# Patient Record
Sex: Female | Born: 1960
Health system: Southern US, Community
[De-identification: ages and names within clinical notes are randomized; demographics above are authoritative.]

## PROBLEM LIST (undated history)

## (undated) ENCOUNTER — Emergency Department (HOSPITAL_COMMUNITY): Discharge status: Against Medical Advice | Payer: Self-pay

## (undated) DIAGNOSIS — I639 Cerebral infarction, unspecified: Secondary | ICD-10-CM

## (undated) DIAGNOSIS — I2699 Other pulmonary embolism without acute cor pulmonale: Secondary | ICD-10-CM

## (undated) DIAGNOSIS — I82409 Acute embolism and thrombosis of unspecified deep veins of unspecified lower extremity: Secondary | ICD-10-CM

## (undated) DIAGNOSIS — K2980 Duodenitis without bleeding: Secondary | ICD-10-CM

## (undated) DIAGNOSIS — F419 Anxiety disorder, unspecified: Secondary | ICD-10-CM

## (undated) DIAGNOSIS — Z8673 Personal history of transient ischemic attack (TIA), and cerebral infarction without residual deficits: Secondary | ICD-10-CM

## (undated) DIAGNOSIS — I469 Cardiac arrest, cause unspecified: Secondary | ICD-10-CM

## (undated) DIAGNOSIS — Z720 Tobacco use: Secondary | ICD-10-CM

## (undated) DIAGNOSIS — F329 Major depressive disorder, single episode, unspecified: Secondary | ICD-10-CM

## (undated) DIAGNOSIS — K625 Hemorrhage of anus and rectum: Secondary | ICD-10-CM

## (undated) DIAGNOSIS — R079 Chest pain, unspecified: Secondary | ICD-10-CM

## (undated) DIAGNOSIS — F1010 Alcohol abuse, uncomplicated: Secondary | ICD-10-CM

## (undated) DIAGNOSIS — I10 Essential (primary) hypertension: Secondary | ICD-10-CM

## (undated) DIAGNOSIS — F1020 Alcohol dependence, uncomplicated: Secondary | ICD-10-CM

## (undated) DIAGNOSIS — F32A Depression, unspecified: Secondary | ICD-10-CM

## (undated) DIAGNOSIS — F1411 Cocaine abuse, in remission: Secondary | ICD-10-CM

## (undated) DIAGNOSIS — D518 Other vitamin B12 deficiency anemias: Secondary | ICD-10-CM

## (undated) DIAGNOSIS — R011 Cardiac murmur, unspecified: Secondary | ICD-10-CM

## (undated) DIAGNOSIS — K219 Gastro-esophageal reflux disease without esophagitis: Secondary | ICD-10-CM

## (undated) DIAGNOSIS — R569 Unspecified convulsions: Secondary | ICD-10-CM

## (undated) HISTORY — DX: Major depressive disorder, single episode, unspecified: F32.9

## (undated) HISTORY — DX: Alcohol dependence, uncomplicated: F10.20

## (undated) HISTORY — DX: Anxiety disorder, unspecified: F41.9

## (undated) HISTORY — DX: Depression, unspecified: F32.A

## (undated) HISTORY — DX: Hemorrhage of anus and rectum: K62.5

## (undated) HISTORY — DX: Cardiac arrest, cause unspecified: I46.9

## (undated) HISTORY — DX: Cocaine abuse, in remission: F14.11

## (undated) HISTORY — DX: Other vitamin B12 deficiency anemias: D51.8

## (undated) HISTORY — DX: Gastro-esophageal reflux disease without esophagitis: K21.9

---

## 1998-04-01 ENCOUNTER — Emergency Department (HOSPITAL_COMMUNITY): Admission: EM | Admit: 1998-04-01 | Discharge: 1998-04-01 | Payer: Self-pay | Admitting: Emergency Medicine

## 1998-06-30 ENCOUNTER — Emergency Department (HOSPITAL_COMMUNITY): Admission: EM | Admit: 1998-06-30 | Discharge: 1998-06-30 | Payer: Self-pay | Admitting: *Deleted

## 1998-07-19 ENCOUNTER — Ambulatory Visit (HOSPITAL_COMMUNITY): Admission: RE | Admit: 1998-07-19 | Discharge: 1998-07-19 | Payer: Self-pay | Admitting: *Deleted

## 1998-07-19 ENCOUNTER — Encounter: Payer: Self-pay | Admitting: *Deleted

## 1999-02-03 ENCOUNTER — Emergency Department (HOSPITAL_COMMUNITY): Admission: EM | Admit: 1999-02-03 | Discharge: 1999-02-03 | Payer: Self-pay | Admitting: Emergency Medicine

## 1999-02-03 ENCOUNTER — Encounter: Payer: Self-pay | Admitting: Emergency Medicine

## 1999-08-07 ENCOUNTER — Encounter: Payer: Self-pay | Admitting: Emergency Medicine

## 1999-08-07 ENCOUNTER — Emergency Department (HOSPITAL_COMMUNITY): Admission: EM | Admit: 1999-08-07 | Discharge: 1999-08-07 | Payer: Self-pay | Admitting: Emergency Medicine

## 1999-10-10 ENCOUNTER — Other Ambulatory Visit: Admission: RE | Admit: 1999-10-10 | Discharge: 1999-10-10 | Payer: Self-pay | Admitting: Family Medicine

## 1999-10-15 ENCOUNTER — Emergency Department (HOSPITAL_COMMUNITY): Admission: EM | Admit: 1999-10-15 | Discharge: 1999-10-15 | Payer: Self-pay | Admitting: Emergency Medicine

## 1999-10-26 ENCOUNTER — Emergency Department (HOSPITAL_COMMUNITY): Admission: EM | Admit: 1999-10-26 | Discharge: 1999-10-26 | Payer: Self-pay | Admitting: Emergency Medicine

## 1999-10-26 ENCOUNTER — Encounter: Payer: Self-pay | Admitting: Emergency Medicine

## 1999-11-04 ENCOUNTER — Emergency Department (HOSPITAL_COMMUNITY): Admission: EM | Admit: 1999-11-04 | Discharge: 1999-11-04 | Payer: Self-pay | Admitting: Emergency Medicine

## 1999-12-25 ENCOUNTER — Inpatient Hospital Stay (HOSPITAL_COMMUNITY): Admission: AD | Admit: 1999-12-25 | Discharge: 1999-12-25 | Payer: Self-pay | Admitting: Obstetrics

## 2000-01-17 ENCOUNTER — Emergency Department (HOSPITAL_COMMUNITY): Admission: EM | Admit: 2000-01-17 | Discharge: 2000-01-17 | Payer: Self-pay | Admitting: Emergency Medicine

## 2000-02-16 ENCOUNTER — Ambulatory Visit (HOSPITAL_COMMUNITY): Admission: RE | Admit: 2000-02-16 | Discharge: 2000-02-16 | Payer: Self-pay | Admitting: Family Medicine

## 2000-03-28 ENCOUNTER — Emergency Department (HOSPITAL_COMMUNITY): Admission: EM | Admit: 2000-03-28 | Discharge: 2000-03-28 | Payer: Self-pay | Admitting: Emergency Medicine

## 2000-06-26 ENCOUNTER — Encounter: Payer: Self-pay | Admitting: Emergency Medicine

## 2000-06-26 ENCOUNTER — Emergency Department (HOSPITAL_COMMUNITY): Admission: EM | Admit: 2000-06-26 | Discharge: 2000-06-26 | Payer: Self-pay | Admitting: Emergency Medicine

## 2000-09-03 ENCOUNTER — Emergency Department (HOSPITAL_COMMUNITY): Admission: EM | Admit: 2000-09-03 | Discharge: 2000-09-03 | Payer: Self-pay

## 2000-09-17 ENCOUNTER — Emergency Department (HOSPITAL_COMMUNITY): Admission: EM | Admit: 2000-09-17 | Discharge: 2000-09-17 | Payer: Self-pay | Admitting: Emergency Medicine

## 2001-01-28 ENCOUNTER — Encounter: Payer: Self-pay | Admitting: Emergency Medicine

## 2001-01-29 ENCOUNTER — Observation Stay (HOSPITAL_COMMUNITY): Admission: EM | Admit: 2001-01-29 | Discharge: 2001-01-29 | Payer: Self-pay | Admitting: Emergency Medicine

## 2001-01-31 ENCOUNTER — Encounter: Admission: RE | Admit: 2001-01-31 | Discharge: 2001-01-31 | Payer: Self-pay | Admitting: Internal Medicine

## 2001-03-24 ENCOUNTER — Emergency Department (HOSPITAL_COMMUNITY): Admission: EM | Admit: 2001-03-24 | Discharge: 2001-03-24 | Payer: Self-pay | Admitting: *Deleted

## 2001-09-04 ENCOUNTER — Inpatient Hospital Stay (HOSPITAL_COMMUNITY): Admission: EM | Admit: 2001-09-04 | Discharge: 2001-09-05 | Payer: Self-pay | Admitting: Emergency Medicine

## 2001-12-02 ENCOUNTER — Emergency Department (HOSPITAL_COMMUNITY): Admission: EM | Admit: 2001-12-02 | Discharge: 2001-12-02 | Payer: Self-pay | Admitting: Emergency Medicine

## 2001-12-02 ENCOUNTER — Emergency Department (HOSPITAL_COMMUNITY): Admission: EM | Admit: 2001-12-02 | Discharge: 2001-12-03 | Payer: Self-pay | Admitting: Emergency Medicine

## 2001-12-03 ENCOUNTER — Encounter: Payer: Self-pay | Admitting: Emergency Medicine

## 2002-04-22 ENCOUNTER — Emergency Department (HOSPITAL_COMMUNITY): Admission: EM | Admit: 2002-04-22 | Discharge: 2002-04-22 | Payer: Self-pay | Admitting: Emergency Medicine

## 2002-06-14 ENCOUNTER — Encounter: Payer: Self-pay | Admitting: Emergency Medicine

## 2002-06-14 ENCOUNTER — Emergency Department (HOSPITAL_COMMUNITY): Admission: EM | Admit: 2002-06-14 | Discharge: 2002-06-14 | Payer: Self-pay | Admitting: Emergency Medicine

## 2002-08-12 ENCOUNTER — Emergency Department (HOSPITAL_COMMUNITY): Admission: EM | Admit: 2002-08-12 | Discharge: 2002-08-12 | Payer: Self-pay

## 2003-05-11 ENCOUNTER — Encounter: Admission: RE | Admit: 2003-05-11 | Discharge: 2003-05-11 | Payer: Self-pay | Admitting: Family Medicine

## 2003-05-11 ENCOUNTER — Encounter: Payer: Self-pay | Admitting: Family Medicine

## 2003-07-04 ENCOUNTER — Emergency Department (HOSPITAL_COMMUNITY): Admission: EM | Admit: 2003-07-04 | Discharge: 2003-07-04 | Payer: Self-pay | Admitting: *Deleted

## 2003-08-21 ENCOUNTER — Encounter: Payer: Self-pay | Admitting: Family Medicine

## 2003-08-21 ENCOUNTER — Ambulatory Visit (HOSPITAL_COMMUNITY): Admission: RE | Admit: 2003-08-21 | Discharge: 2003-08-21 | Payer: Self-pay | Admitting: Family Medicine

## 2004-04-29 ENCOUNTER — Ambulatory Visit (HOSPITAL_COMMUNITY): Admission: RE | Admit: 2004-04-29 | Discharge: 2004-04-29 | Payer: Self-pay | Admitting: Internal Medicine

## 2004-05-09 ENCOUNTER — Emergency Department (HOSPITAL_COMMUNITY): Admission: EM | Admit: 2004-05-09 | Discharge: 2004-05-09 | Payer: Self-pay | Admitting: Emergency Medicine

## 2004-05-30 ENCOUNTER — Encounter: Admission: RE | Admit: 2004-05-30 | Discharge: 2004-05-30 | Payer: Self-pay | Admitting: Family Medicine

## 2004-08-15 ENCOUNTER — Ambulatory Visit: Payer: Self-pay | Admitting: Family Medicine

## 2004-09-22 ENCOUNTER — Emergency Department (HOSPITAL_COMMUNITY): Admission: EM | Admit: 2004-09-22 | Discharge: 2004-09-22 | Payer: Self-pay | Admitting: Emergency Medicine

## 2004-12-05 ENCOUNTER — Ambulatory Visit: Payer: Self-pay | Admitting: Psychiatry

## 2004-12-05 ENCOUNTER — Inpatient Hospital Stay (HOSPITAL_COMMUNITY): Admission: RE | Admit: 2004-12-05 | Discharge: 2004-12-10 | Payer: Self-pay | Admitting: Psychiatry

## 2005-02-19 ENCOUNTER — Emergency Department (HOSPITAL_COMMUNITY): Admission: EM | Admit: 2005-02-19 | Discharge: 2005-02-20 | Payer: Self-pay | Admitting: *Deleted

## 2005-02-22 ENCOUNTER — Ambulatory Visit: Payer: Self-pay | Admitting: *Deleted

## 2005-02-22 ENCOUNTER — Ambulatory Visit: Payer: Self-pay | Admitting: Family Medicine

## 2005-10-30 DIAGNOSIS — K625 Hemorrhage of anus and rectum: Secondary | ICD-10-CM

## 2005-10-30 HISTORY — DX: Hemorrhage of anus and rectum: K62.5

## 2006-04-23 ENCOUNTER — Ambulatory Visit: Payer: Self-pay | Admitting: Internal Medicine

## 2006-04-23 ENCOUNTER — Inpatient Hospital Stay (HOSPITAL_COMMUNITY): Admission: EM | Admit: 2006-04-23 | Discharge: 2006-04-26 | Payer: Self-pay | Admitting: Emergency Medicine

## 2006-04-25 ENCOUNTER — Encounter (INDEPENDENT_AMBULATORY_CARE_PROVIDER_SITE_OTHER): Payer: Self-pay | Admitting: *Deleted

## 2006-04-26 ENCOUNTER — Encounter: Payer: Self-pay | Admitting: Cardiology

## 2006-05-09 ENCOUNTER — Ambulatory Visit: Payer: Self-pay | Admitting: Internal Medicine

## 2006-06-05 ENCOUNTER — Ambulatory Visit: Payer: Self-pay | Admitting: Internal Medicine

## 2006-06-22 ENCOUNTER — Ambulatory Visit (HOSPITAL_COMMUNITY): Admission: RE | Admit: 2006-06-22 | Discharge: 2006-06-22 | Payer: Self-pay | Admitting: Internal Medicine

## 2006-06-26 ENCOUNTER — Ambulatory Visit: Payer: Self-pay | Admitting: Hospitalist

## 2006-06-29 ENCOUNTER — Encounter: Admission: RE | Admit: 2006-06-29 | Discharge: 2006-06-29 | Payer: Self-pay | Admitting: Internal Medicine

## 2006-08-23 ENCOUNTER — Emergency Department (HOSPITAL_COMMUNITY): Admission: EM | Admit: 2006-08-23 | Discharge: 2006-08-23 | Payer: Self-pay | Admitting: Emergency Medicine

## 2006-08-30 ENCOUNTER — Ambulatory Visit: Payer: Self-pay | Admitting: Psychiatry

## 2006-08-30 ENCOUNTER — Inpatient Hospital Stay (HOSPITAL_COMMUNITY): Admission: EM | Admit: 2006-08-30 | Discharge: 2006-09-03 | Payer: Self-pay | Admitting: Psychiatry

## 2006-10-25 ENCOUNTER — Ambulatory Visit: Payer: Self-pay | Admitting: Internal Medicine

## 2006-12-28 ENCOUNTER — Emergency Department (HOSPITAL_COMMUNITY): Admission: EM | Admit: 2006-12-28 | Discharge: 2006-12-28 | Payer: Self-pay | Admitting: Emergency Medicine

## 2007-05-09 ENCOUNTER — Encounter (INDEPENDENT_AMBULATORY_CARE_PROVIDER_SITE_OTHER): Payer: Self-pay | Admitting: Internal Medicine

## 2007-05-09 ENCOUNTER — Ambulatory Visit: Payer: Self-pay | Admitting: Internal Medicine

## 2007-05-09 DIAGNOSIS — K648 Other hemorrhoids: Secondary | ICD-10-CM | POA: Insufficient documentation

## 2007-05-09 DIAGNOSIS — F1910 Other psychoactive substance abuse, uncomplicated: Secondary | ICD-10-CM

## 2007-05-09 DIAGNOSIS — F329 Major depressive disorder, single episode, unspecified: Secondary | ICD-10-CM

## 2007-05-13 LAB — CONVERTED CEMR LAB
Candida species: NEGATIVE
Gardnerella vaginalis: POSITIVE — AB
Hemoglobin: 11.3 g/dL — ABNORMAL LOW (ref 12.0–15.0)
MCV: 92.6 fL (ref 78.0–100.0)
RDW: 15.9 % — ABNORMAL HIGH (ref 11.5–14.0)
Trichomonal Vaginitis: POSITIVE — AB
WBC: 8.5 10*3/uL (ref 4.0–10.5)

## 2007-07-25 ENCOUNTER — Ambulatory Visit: Payer: Self-pay | Admitting: Internal Medicine

## 2007-07-25 ENCOUNTER — Encounter (INDEPENDENT_AMBULATORY_CARE_PROVIDER_SITE_OTHER): Payer: Self-pay | Admitting: Internal Medicine

## 2007-07-25 DIAGNOSIS — N951 Menopausal and female climacteric states: Secondary | ICD-10-CM

## 2007-07-26 LAB — CONVERTED CEMR LAB
Candida species: NEGATIVE
Gardnerella vaginalis: POSITIVE — AB

## 2007-10-08 ENCOUNTER — Ambulatory Visit: Payer: Self-pay | Admitting: Internal Medicine

## 2007-10-08 ENCOUNTER — Encounter (INDEPENDENT_AMBULATORY_CARE_PROVIDER_SITE_OTHER): Payer: Self-pay | Admitting: Internal Medicine

## 2007-10-08 DIAGNOSIS — R413 Other amnesia: Secondary | ICD-10-CM | POA: Insufficient documentation

## 2007-10-09 ENCOUNTER — Ambulatory Visit (HOSPITAL_COMMUNITY): Admission: RE | Admit: 2007-10-09 | Discharge: 2007-10-09 | Payer: Self-pay | Admitting: Internal Medicine

## 2007-10-09 LAB — CONVERTED CEMR LAB
AST: 53 units/L — ABNORMAL HIGH (ref 0–37)
Albumin: 4.4 g/dL (ref 3.5–5.2)
Barbiturate Quant, Ur: NEGATIVE
Basophils Relative: 0 % (ref 0–1)
Benzodiazepines.: NEGATIVE
Cocaine Metabolites: POSITIVE — AB
Creatinine, Ser: 1.03 mg/dL (ref 0.40–1.20)
Creatinine,U: 202.6 mg/dL
Glucose, Bld: 77 mg/dL (ref 70–99)
HCT: 37.7 % (ref 36.0–46.0)
Hemoglobin: 12.4 g/dL (ref 12.0–15.0)
Marijuana Metabolite: NEGATIVE
Monocytes Absolute: 0.6 10*3/uL (ref 0.1–1.0)
Monocytes Relative: 10 % (ref 3–12)
Opiates: NEGATIVE
Platelets: 277 10*3/uL (ref 150–400)
RDW: 16.2 % — ABNORMAL HIGH (ref 11.5–15.5)
TSH: 2.066 microintl units/mL (ref 0.350–5.50)
Total Bilirubin: 0.7 mg/dL (ref 0.3–1.2)
Total Protein: 7.6 g/dL (ref 6.0–8.3)
Vitamin B-12: 175 pg/mL — ABNORMAL LOW (ref 211–911)
WBC: 6.5 10*3/uL (ref 4.0–10.5)

## 2007-10-10 ENCOUNTER — Ambulatory Visit: Payer: Self-pay | Admitting: Internal Medicine

## 2007-10-10 ENCOUNTER — Encounter (INDEPENDENT_AMBULATORY_CARE_PROVIDER_SITE_OTHER): Payer: Self-pay | Admitting: Internal Medicine

## 2007-10-10 DIAGNOSIS — E876 Hypokalemia: Secondary | ICD-10-CM

## 2007-10-10 LAB — CONVERTED CEMR LAB: Glucose, Bld: 121 mg/dL — ABNORMAL HIGH (ref 70–99)

## 2007-10-29 ENCOUNTER — Telehealth: Payer: Self-pay | Admitting: *Deleted

## 2007-11-04 ENCOUNTER — Ambulatory Visit: Payer: Self-pay | Admitting: Internal Medicine

## 2007-11-05 ENCOUNTER — Ambulatory Visit: Payer: Self-pay | Admitting: Internal Medicine

## 2007-11-06 ENCOUNTER — Ambulatory Visit: Payer: Self-pay | Admitting: Internal Medicine

## 2007-11-07 ENCOUNTER — Ambulatory Visit: Payer: Self-pay | Admitting: Internal Medicine

## 2007-11-08 ENCOUNTER — Ambulatory Visit: Payer: Self-pay | Admitting: Infectious Disease

## 2007-11-11 ENCOUNTER — Ambulatory Visit: Payer: Self-pay | Admitting: Internal Medicine

## 2007-11-11 DIAGNOSIS — D518 Other vitamin B12 deficiency anemias: Secondary | ICD-10-CM

## 2007-11-11 HISTORY — DX: Other vitamin B12 deficiency anemias: D51.8

## 2007-12-02 ENCOUNTER — Ambulatory Visit: Payer: Self-pay | Admitting: Hospitalist

## 2007-12-27 ENCOUNTER — Emergency Department (HOSPITAL_COMMUNITY): Admission: EM | Admit: 2007-12-27 | Discharge: 2007-12-27 | Payer: Self-pay | Admitting: Emergency Medicine

## 2008-01-07 ENCOUNTER — Ambulatory Visit: Payer: Self-pay | Admitting: Internal Medicine

## 2008-01-07 DIAGNOSIS — K089 Disorder of teeth and supporting structures, unspecified: Secondary | ICD-10-CM | POA: Insufficient documentation

## 2008-01-08 ENCOUNTER — Ambulatory Visit (HOSPITAL_COMMUNITY): Admission: RE | Admit: 2008-01-08 | Discharge: 2008-01-08 | Payer: Self-pay | Admitting: Hospitalist

## 2008-01-10 ENCOUNTER — Telehealth: Payer: Self-pay | Admitting: *Deleted

## 2008-02-25 ENCOUNTER — Emergency Department (HOSPITAL_COMMUNITY): Admission: EM | Admit: 2008-02-25 | Discharge: 2008-02-26 | Payer: Self-pay | Admitting: Emergency Medicine

## 2008-03-11 ENCOUNTER — Ambulatory Visit: Payer: Self-pay | Admitting: Infectious Disease

## 2008-04-10 ENCOUNTER — Ambulatory Visit: Payer: Self-pay | Admitting: Internal Medicine

## 2008-04-10 DIAGNOSIS — R1013 Epigastric pain: Secondary | ICD-10-CM | POA: Insufficient documentation

## 2008-04-15 ENCOUNTER — Encounter (INDEPENDENT_AMBULATORY_CARE_PROVIDER_SITE_OTHER): Payer: Self-pay | Admitting: Internal Medicine

## 2008-04-15 ENCOUNTER — Ambulatory Visit: Payer: Self-pay | Admitting: *Deleted

## 2008-04-15 ENCOUNTER — Ambulatory Visit (HOSPITAL_COMMUNITY): Admission: RE | Admit: 2008-04-15 | Discharge: 2008-04-15 | Payer: Self-pay | Admitting: Internal Medicine

## 2008-04-19 LAB — CONVERTED CEMR LAB
CO2: 23 meq/L (ref 19–32)
Calcium: 9.4 mg/dL (ref 8.4–10.5)
Chloride: 107 meq/L (ref 96–112)
Cholesterol: 202 mg/dL — ABNORMAL HIGH (ref 0–200)
Creatinine, Ser: 0.7 mg/dL (ref 0.40–1.20)
Glucose, Bld: 104 mg/dL — ABNORMAL HIGH (ref 70–99)
HCT: 36.6 % (ref 36.0–46.0)
HDL: 92 mg/dL (ref >39)
MCV: 100 fL (ref 78.0–100.0)
Platelets: 288 10*3/uL (ref 150–400)
Potassium: 4.4 meq/L (ref 3.5–5.3)
RDW: 16.9 % — ABNORMAL HIGH (ref 11.5–15.5)
Sodium: 142 meq/L (ref 135–145)
Triglycerides: 56 mg/dL (ref <150)

## 2008-04-21 ENCOUNTER — Telehealth: Payer: Self-pay | Admitting: *Deleted

## 2008-05-05 ENCOUNTER — Telehealth: Payer: Self-pay | Admitting: *Deleted

## 2008-07-20 ENCOUNTER — Telehealth (INDEPENDENT_AMBULATORY_CARE_PROVIDER_SITE_OTHER): Payer: Self-pay | Admitting: Internal Medicine

## 2008-07-21 ENCOUNTER — Ambulatory Visit: Payer: Self-pay | Admitting: Internal Medicine

## 2008-07-21 ENCOUNTER — Ambulatory Visit (HOSPITAL_COMMUNITY): Admission: RE | Admit: 2008-07-21 | Discharge: 2008-07-21 | Payer: Self-pay | Admitting: Internal Medicine

## 2008-07-24 ENCOUNTER — Telehealth (INDEPENDENT_AMBULATORY_CARE_PROVIDER_SITE_OTHER): Payer: Self-pay | Admitting: Internal Medicine

## 2008-07-24 ENCOUNTER — Telehealth: Payer: Self-pay | Admitting: *Deleted

## 2008-08-12 ENCOUNTER — Ambulatory Visit: Payer: Self-pay | Admitting: Internal Medicine

## 2008-08-31 ENCOUNTER — Encounter (INDEPENDENT_AMBULATORY_CARE_PROVIDER_SITE_OTHER): Payer: Self-pay | Admitting: Internal Medicine

## 2008-09-21 ENCOUNTER — Encounter (INDEPENDENT_AMBULATORY_CARE_PROVIDER_SITE_OTHER): Payer: Self-pay | Admitting: Internal Medicine

## 2008-09-21 ENCOUNTER — Ambulatory Visit: Payer: Self-pay | Admitting: Internal Medicine

## 2008-09-21 DIAGNOSIS — N760 Acute vaginitis: Secondary | ICD-10-CM | POA: Insufficient documentation

## 2008-09-21 LAB — CONVERTED CEMR LAB
Candida species: NEGATIVE
Gardnerella vaginalis: POSITIVE — AB

## 2008-11-24 ENCOUNTER — Encounter: Payer: Self-pay | Admitting: Internal Medicine

## 2008-11-24 ENCOUNTER — Encounter (INDEPENDENT_AMBULATORY_CARE_PROVIDER_SITE_OTHER): Payer: Self-pay | Admitting: Internal Medicine

## 2008-11-24 ENCOUNTER — Ambulatory Visit: Payer: Self-pay | Admitting: Internal Medicine

## 2008-11-28 LAB — CONVERTED CEMR LAB: Vitamin B-12: >2000 pg/mL — ABNORMAL HIGH (ref 211–911)

## 2009-01-21 ENCOUNTER — Ambulatory Visit: Payer: Self-pay | Admitting: Internal Medicine

## 2009-01-21 LAB — CONVERTED CEMR LAB
ALT: 22 units/L (ref 0–35)
AST: 25 units/L (ref 0–37)
Alkaline Phosphatase: 58 units/L (ref 39–117)
BUN: 15 mg/dL (ref 6–23)
Chloride: 105 meq/L (ref 96–112)
Creatinine, Ser: 0.7 mg/dL (ref 0.40–1.20)
GFR calc Af Amer: >60 mL/min (ref >60)
Potassium: 4.7 meq/L (ref 3.5–5.3)
Sodium: 141 meq/L (ref 135–145)

## 2009-02-09 ENCOUNTER — Telehealth (INDEPENDENT_AMBULATORY_CARE_PROVIDER_SITE_OTHER): Payer: Self-pay | Admitting: Internal Medicine

## 2009-02-11 ENCOUNTER — Telehealth (INDEPENDENT_AMBULATORY_CARE_PROVIDER_SITE_OTHER): Payer: Self-pay | Admitting: Pharmacy Technician

## 2009-03-09 ENCOUNTER — Telehealth (INDEPENDENT_AMBULATORY_CARE_PROVIDER_SITE_OTHER): Payer: Self-pay | Admitting: Internal Medicine

## 2009-04-06 ENCOUNTER — Ambulatory Visit: Payer: Self-pay | Admitting: Internal Medicine

## 2009-05-12 ENCOUNTER — Ambulatory Visit (HOSPITAL_COMMUNITY): Admission: RE | Admit: 2009-05-12 | Discharge: 2009-05-12 | Payer: Self-pay | Admitting: Unknown Physician Specialty

## 2009-05-16 ENCOUNTER — Encounter (INDEPENDENT_AMBULATORY_CARE_PROVIDER_SITE_OTHER): Payer: Self-pay | Admitting: Internal Medicine

## 2009-05-16 ENCOUNTER — Ambulatory Visit: Payer: Self-pay | Admitting: Internal Medicine

## 2009-05-16 ENCOUNTER — Inpatient Hospital Stay (HOSPITAL_COMMUNITY): Admission: EM | Admit: 2009-05-16 | Discharge: 2009-05-19 | Payer: Self-pay | Admitting: Emergency Medicine

## 2009-05-16 DIAGNOSIS — Z86711 Personal history of pulmonary embolism: Secondary | ICD-10-CM

## 2009-05-16 DIAGNOSIS — I2699 Other pulmonary embolism without acute cor pulmonale: Secondary | ICD-10-CM

## 2009-05-16 HISTORY — DX: Other pulmonary embolism without acute cor pulmonale: I26.99

## 2009-05-17 ENCOUNTER — Encounter: Payer: Self-pay | Admitting: Internal Medicine

## 2009-05-19 ENCOUNTER — Encounter (INDEPENDENT_AMBULATORY_CARE_PROVIDER_SITE_OTHER): Payer: Self-pay | Admitting: Internal Medicine

## 2009-05-21 ENCOUNTER — Ambulatory Visit: Payer: Self-pay | Admitting: Infectious Diseases

## 2009-05-21 LAB — CONVERTED CEMR LAB: INR: 1

## 2009-05-24 ENCOUNTER — Ambulatory Visit: Payer: Self-pay | Admitting: Internal Medicine

## 2009-05-24 LAB — CONVERTED CEMR LAB: INR: 1.8

## 2009-05-27 ENCOUNTER — Ambulatory Visit: Payer: Self-pay | Admitting: Internal Medicine

## 2009-06-01 ENCOUNTER — Ambulatory Visit: Payer: Self-pay | Admitting: Internal Medicine

## 2009-06-01 DIAGNOSIS — M79609 Pain in unspecified limb: Secondary | ICD-10-CM

## 2009-06-02 ENCOUNTER — Encounter: Payer: Self-pay | Admitting: Internal Medicine

## 2009-06-03 ENCOUNTER — Telehealth: Payer: Self-pay | Admitting: Internal Medicine

## 2009-06-07 ENCOUNTER — Ambulatory Visit: Payer: Self-pay | Admitting: Internal Medicine

## 2009-06-14 ENCOUNTER — Ambulatory Visit: Payer: Self-pay | Admitting: Internal Medicine

## 2009-06-24 ENCOUNTER — Encounter (INDEPENDENT_AMBULATORY_CARE_PROVIDER_SITE_OTHER): Payer: Self-pay | Admitting: Internal Medicine

## 2009-07-12 ENCOUNTER — Ambulatory Visit: Payer: Self-pay | Admitting: Infectious Diseases

## 2009-07-12 LAB — CONVERTED CEMR LAB: INR: 2.1

## 2009-07-26 ENCOUNTER — Ambulatory Visit: Payer: Self-pay | Admitting: Infectious Diseases

## 2009-07-26 LAB — CONVERTED CEMR LAB: INR: 2

## 2009-07-27 ENCOUNTER — Encounter: Payer: Self-pay | Admitting: Internal Medicine

## 2009-08-10 ENCOUNTER — Ambulatory Visit: Payer: Self-pay | Admitting: Internal Medicine

## 2009-08-30 ENCOUNTER — Ambulatory Visit: Payer: Self-pay | Admitting: Internal Medicine

## 2009-08-30 LAB — CONVERTED CEMR LAB

## 2009-09-20 ENCOUNTER — Ambulatory Visit: Payer: Self-pay | Admitting: Internal Medicine

## 2009-09-27 ENCOUNTER — Ambulatory Visit: Payer: Self-pay | Admitting: Internal Medicine

## 2009-09-27 LAB — CONVERTED CEMR LAB

## 2009-09-30 ENCOUNTER — Emergency Department (HOSPITAL_COMMUNITY): Admission: EM | Admit: 2009-09-30 | Discharge: 2009-09-30 | Payer: Self-pay | Admitting: Emergency Medicine

## 2009-10-01 ENCOUNTER — Telehealth: Payer: Self-pay | Admitting: Internal Medicine

## 2009-10-11 ENCOUNTER — Ambulatory Visit: Payer: Self-pay | Admitting: Internal Medicine

## 2009-10-11 LAB — CONVERTED CEMR LAB: INR: 4.7

## 2009-11-29 ENCOUNTER — Ambulatory Visit: Payer: Self-pay | Admitting: Internal Medicine

## 2009-11-29 LAB — CONVERTED CEMR LAB

## 2009-12-09 ENCOUNTER — Telehealth: Payer: Self-pay | Admitting: Internal Medicine

## 2010-01-06 ENCOUNTER — Ambulatory Visit: Payer: Self-pay | Admitting: Infectious Diseases

## 2010-01-06 LAB — CONVERTED CEMR LAB
Bilirubin Urine: NEGATIVE
Glucose, Urine, Semiquant: NEGATIVE
Nitrite: NEGATIVE
Specific Gravity, Urine: 1.015
pH: 5.5

## 2010-01-07 LAB — CONVERTED CEMR LAB
Candida species: NEGATIVE
Chlamydia, DNA Probe: NEGATIVE
GC Probe Amp, Genital: NEGATIVE
Leukocytes, UA: NEGATIVE
Nitrite: NEGATIVE
Protein, ur: NEGATIVE mg/dL
Urobilinogen, UA: 1 (ref 0.0–1.0)

## 2010-01-10 ENCOUNTER — Ambulatory Visit: Payer: Self-pay | Admitting: Infectious Diseases

## 2010-01-10 ENCOUNTER — Telehealth: Payer: Self-pay | Admitting: Internal Medicine

## 2010-01-11 LAB — CONVERTED CEMR LAB: Pap Smear: NEGATIVE

## 2010-01-13 ENCOUNTER — Ambulatory Visit: Payer: Self-pay | Admitting: Internal Medicine

## 2010-01-13 LAB — CONVERTED CEMR LAB: INR: 1.9

## 2010-06-26 ENCOUNTER — Emergency Department (HOSPITAL_COMMUNITY): Admission: EM | Admit: 2010-06-26 | Discharge: 2010-06-26 | Payer: Self-pay | Admitting: Emergency Medicine

## 2010-09-14 ENCOUNTER — Emergency Department (HOSPITAL_COMMUNITY): Admission: EM | Admit: 2010-09-14 | Discharge: 2010-09-14 | Payer: Self-pay | Admitting: Emergency Medicine

## 2010-11-20 ENCOUNTER — Encounter: Payer: Self-pay | Admitting: Internal Medicine

## 2010-11-21 ENCOUNTER — Ambulatory Visit: Admission: RE | Admit: 2010-11-21 | Discharge: 2010-11-21 | Payer: Self-pay | Source: Home / Self Care

## 2010-11-21 LAB — CONVERTED CEMR LAB: INR: 1

## 2010-11-29 NOTE — Progress Notes (Signed)
Summary: Medication/ Results  Phone Note Outgoing Call   Call placed by: Angelina Ok RN,  January 10, 2010 9:28 AM Call placed to: Patient Summary of Call: Call to pt informed of results of Wet Prep and need to take metronidazole.  Pt was informed that she has a Bacterial infection that will need treatment.  Pt was give ETOH precautions for the Metronidazole.  Pt was also advised to reframe from unprotected sex and to advise her partner of if he would like to be treated by his physician.  Pt ststed understanding of the plan. Prescription for Metronidazole 500 mg tablets  1 po bid x 7 days was called to the [facility] on [address].  [facility] [address] was cancelled. Angelina Ok RN  January 10, 2010 9:32 AM  Initial call taken by: Angelina Ok RN,  January 10, 2010 9:30 AM

## 2010-11-29 NOTE — Assessment & Plan Note (Signed)
Summary: COU/SB.  Anticoagulant Therapy Managed by: Barbera Setters. [name]  PharmD CACP PCP: Vassie Loll MD [facility] Attending: Josem Kaufmann MD, Lawrence Indication 1: Pulmonary  embolus Indication 2: Encounter for therapeutic drug monitoring  V58.83 Start date: 05/16/2009 Duration: 1 year  Patient Assessment Reviewed by: Chancy Milroy PharmD  November 29, 2009 Medication review: verified warfarin dosage & schedule,verified previous prescription medications, verified doses & any changes, verified new medications, reviewed OTC medications, reviewed OTC health products-vitamins supplements etc Complications: none Dietary changes: none   Health status changes: none   Lifestyle changes: none   Recent/future hospitalizations: none   Recent/future procedures: none   Recent/future dental: none Patient Assessment Part 2:  Have you MISSED ANY DOSES or CHANGED TABLETS?  No missed Warfarin doses or changed tablets.  Have you had any BRUISING or BLEEDING ( nose or gum bleeds,blood in urine or stool)?  No reported bruising or bleeding in nose, gums, urine, stool.  Have you STARTED or STOPPED any MEDICATIONS, including OTC meds,herbals or supplements?  No other medications or herbal supplements were started or stopped.  Have you CHANGED your DIET, especially green vegetables,or ALCOHOL intake?  No changes in diet or alcohol intake.  Have you had any ILLNESSES or HOSPITALIZATIONS?  No reported illnesses or hospitalizations  Have you had any signs of CLOTTING?(chest discomfort,dizziness,shortness of breath,arms tingling,slurred speech,swelling or redness in leg)    No chest discomfort, dizziness, shortness of breath, tingling in arm, slurred speech, swelling, or redness in leg.     Treatment  Target INR: 2.0-3.0 INR: 1.4  Date: 11/29/2009 Regimen In:  55.0mg /week INR reflects regimen in: 1.4  New  Tablet strength: : 5mg  Regimen Out:     Sunday: 1 & 1/2 Tablet     Monday: 2 Tablet     Tuesday: 1 &  1/2 Tablet     Wednesday: 2 Tablet     Thursday: 1 & 1/2 Tablet      Friday: 2 Tablet     Saturday: 1 & 1/2 Tablet Total Weekly: 60.0mg/week mg  Next INR Due: 12/06/2009 Adjusted by: Sabreena Vogan B. Riggins Cisek III PharmD CACP   Return to anticoagulation clinic:  12/06/2009 Time of next visit: 1115    Allergies: 1)  ! * Contrast Dye Prescriptions: WARFARIN SODIUM 5 MG TABS (WARFARIN SODIUM) Take 1 tablet by mouth once a day  #50 x 2   Entered by:   Jay Louana Fontenot PharmD   Authorized by:   Lawrence Klima MD   Signed by:   Jay Jameela Michna PharmD on 11/29/2009   Method used:   Electronically to        [id number]* (retail)       [address]       [place], [place]  [address]       Ph: [phone]       Fax: [phone]   RxID:   [id number]

## 2010-11-29 NOTE — Progress Notes (Signed)
Summary: refill/gg  Phone Note Refill Request  on December 09, 2009 4:22 PM  Refills Requested: Medication #1:  CLONIDINE HCL 0.1 MG TABS Take 1 tablet by mouth once a day.   Last Refilled: 09/27/2009  Method Requested: Electronic Initial call taken by: Merrie Roof RN,  December 09, 2009 4:22 PM  Follow-up for Phone Call        Refill approved-nurse to complete    Prescriptions: CLONIDINE HCL 0.1 MG TABS (CLONIDINE HCL) Take 1 tablet by mouth once a day  #30 x 5   Entered and Authorized by:   Vassie Loll MD   Signed by:   Vassie Loll MD on 12/09/2009   Method used:   Electronically to        [facility] [phone]* (retail)       [address]       [place], [place]  [address]       Ph: [phone]       Fax: [phone]   RxID:   [id number]

## 2010-11-29 NOTE — Assessment & Plan Note (Signed)
Summary: COU/APPT 10AM/VS  Anticoagulant Therapy Managed by: Barbera Setters. Janie Morning  PharmD CACP PCP: Vassie Loll MD [facility] Attending: Rogelia Boga MD, Lanora Manis Indication 1: Pulmonary  embolus Indication 2: Encounter for therapeutic drug monitoring  V58.83 Start date: 05/16/2009 Duration: 1 year  Patient Assessment Reviewed by: Chancy Milroy PharmD  January 13, 2010 Medication review: verified warfarin dosage & schedule,verified previous prescription medications, verified doses & any changes, verified new medications, reviewed OTC medications, reviewed OTC health products-vitamins supplements etc Complications: none Dietary changes: none   Health status changes: none   Lifestyle changes: none   Recent/future hospitalizations: none   Recent/future procedures: none   Recent/future dental: none Patient Assessment Part 2:  Have you MISSED ANY DOSES or CHANGED TABLETS?  No missed Warfarin doses or changed tablets.  Have you had any BRUISING or BLEEDING ( nose or gum bleeds,blood in urine or stool)?  No reported bruising or bleeding in nose, gums, urine, stool.  Have you STARTED or STOPPED any MEDICATIONS, including OTC meds,herbals or supplements?  No other medications or herbal supplements were started or stopped.  Have you CHANGED your DIET, especially green vegetables,or ALCOHOL intake?  No changes in diet or alcohol intake.  Have you had any ILLNESSES or HOSPITALIZATIONS?  No reported illnesses or hospitalizations  Have you had any signs of CLOTTING?(chest discomfort,dizziness,shortness of breath,arms tingling,slurred speech,swelling or redness in leg)    No chest discomfort, dizziness, shortness of breath, tingling in arm, slurred speech, swelling, or redness in leg.     Treatment  Target INR: 2.0-3.0 INR: 1.9  Date: 01/13/2010 Regimen In:  60.0mg /week INR reflects regimen in: 1.9  New  Tablet strength: : 5mg  Regimen Out:     Sunday: 2 Tablet     Monday: 1 & 1/2 Tablet  Tuesday: 2 Tablet     Wednesday: 2 Tablet     Thursday: 1 & 1/2 Tablet      Friday: 2 Tablet     Saturday: 2 Tablet Total Weekly: 65.0mg /week mg  Next INR Due: 01/24/2010 Adjusted by: Barbera Setters. Alexandria Lodge III PharmD CACP   Return to anticoagulation clinic:  01/24/2010 Time of next visit: 0945    Allergies: 1)  ! * Contrast Dye

## 2010-11-29 NOTE — Assessment & Plan Note (Signed)
Summary: 261/CFB  Anticoagulant Therapy Managed by: Barbera Setters. [name]  PharmD CACP PCP: Vassie Loll MD [facility] Attending: Sampson Goon MD, Onalee Hua Indication 1: Pulmonary  embolus Indication 2: Encounter for therapeutic drug monitoring  V58.83 Start date: 05/16/2009 Duration: 1 year  Patient Assessment Reviewed by: Chancy Milroy PharmD  January 10, 2010 Medication review: verified warfarin dosage & schedule,verified previous prescription medications, verified doses & any changes, verified new medications, reviewed OTC medications, reviewed OTC health products-vitamins supplements etc Complications: none Dietary changes: none   Health status changes: none   Lifestyle changes: none   Recent/future hospitalizations: none   Recent/future procedures: none   Recent/future dental: none Patient Assessment Part 2:  Have you MISSED ANY DOSES or CHANGED TABLETS?  YES. States she missed 3 days of warfarin last week with a "stomach virus".  Have you had any BRUISING or BLEEDING ( nose or gum bleeds,blood in urine or stool)?  No reported bruising or bleeding in nose, gums, urine, stool.  Have you STARTED or STOPPED any MEDICATIONS, including OTC meds,herbals or supplements?  YES. States she has a PRESCRIPTION awaiting PICK UP for METRONDIAZOLE for 7 days duration. She is picking up today.  Have you CHANGED your DIET, especially green vegetables,or ALCOHOL intake?  No changes in diet or alcohol intake.  Have you had any ILLNESSES or HOSPITALIZATIONS?  YES. States she had a stomach virus last week  Have you had any signs of CLOTTING?(chest discomfort,dizziness,shortness of breath,arms tingling,slurred speech,swelling or redness in leg)    No chest discomfort, dizziness, shortness of breath, tingling in arm, slurred speech, swelling, or redness in leg.     Treatment  Target INR: 2.0-3.0 INR: 1.5  Date: 01/10/2010 Regimen In:  60.0mg /week INR reflects regimen in: 1.5  New  Tablet strength: :  5mg  Next INR Due: 01/13/2010 Adjusted by: Barbera Setters. Alexandria Lodge III PharmD CACP   Return to anticoagulation clinic:  01/13/2010 Time of next visit: 1000   Comments: Patient will commence METRONIDAZOLE 1 tablet by mouth two times a day TODAY. As such, will need to re-evaluate INR on THURSDAY OF THIS WEEK---she understands the importance and states she will be here.  Allergies: 1)  ! * Contrast Dye

## 2010-11-29 NOTE — Assessment & Plan Note (Signed)
Summary: CHECKUP/PAP/SB.   Vital Signs:  Patient profile:   50 year old female Height:      64 inches (162.56 cm) Weight:      136.04 pounds (61.84 kg) BMI:     23.44 Temp:     97.7 degrees F (36.50 degrees C) oral Pulse rate:   100 / minute BP sitting:   112 / 71  (right arm)  Vitals Entered By: Angelina Ok RN (January 06, 2010 11:01 AM) Is Patient Diabetic? No Pain Assessment Patient in pain? no      Nutritional Status BMI of 19 -24 = normal  Have you ever been in a relationship where you felt threatened, hurt or afraid?No   Does patient need assistance? Functional Status Self care Ambulation Normal Comments Hot Flash medicine is not working.  Feels she has to go.  Then does not go.  Back pain at times.  Relieved when she goes to the bathroom.  Occassional burning on urination.  Needs refill on Famotidine.   Primary Care Provider:  Vassie Loll MD   History of Present Illness: 50 year old lady with pmh as mentioned in the EMR. Who comes to the clinic complaining of occassional burning sensation and urgency for the last two weeks. Patient denies fever, chills, hematuria, abdominal or any other complaints. Dipstick UA is no significant for infection.  Patient reprots that she is experiencing hot flashes and will like to received some medications to help her with that, she knows is related to her menopause, but will like treatment for that. There is first deggree relative with hx of breast cancer and also personal hx of PE. Patient reports that clonidine is not working for her.  Patient would like to have her pap smear done today.  Patient reports the her depression is better and she denies SI or hallucinations.    Depression History:      The patient denies a depressed mood most of the day and a diminished interest in her usual daily activities.        The patient denies that she feels like life is not worth living, denies that she wishes that she were dead, and denies  that she has thought about ending her life.              Preventive Screening-Counseling & Management  Alcohol-Tobacco     Alcohol drinks/day: 1     Alcohol type: beer     Smoking Status: current     Smoking Cessation Counseling: yes     Packs/Day: 1-2 cigs per day     Year Started: at the age of 20     Passive Smoke Exposure: no  Problems Prior to Update: 1)  Dysuria  (ICD-788.1) 2)  Arm Pain, Left  (ICD-729.5) 3)  Long-term Use of Antiplatelet/antithrombotic  (ICD-V58.63) 4)  Pe  (ICD-415.19) 5)  Vaginitis  (ICD-616.10) 6)  Abdominal Pain, Epigastric  (ICD-789.06) 7)  Dental Pain  (ICD-525.9) 8)  Anemia, Vitamin B12 Deficiency  (ICD-281.1) 9)  Hypokalemia  (ICD-276.8) 10)  Memory Loss  (ICD-780.93) 11)  Memory Loss  (ICD-780.93) 12)  Hot Flashes  (ICD-627.2) 13)  Depression  (ICD-311) 14)  Alcohol Abuse, Hx of  (ICD-V11.3) 15)  Hemorrhoids, Internal  (ICD-455.0)  Current Problems (verified): 1)  Dysuria  (ICD-788.1) 2)  Arm Pain, Left  (ICD-729.5) 3)  Long-term Use of Antiplatelet/antithrombotic  (ICD-V58.63) 4)  Pe  (ICD-415.19) 5)  Vaginitis  (ICD-616.10) 6)  Abdominal Pain, Epigastric  (ICD-789.06) 7)  Dental Pain  (ICD-525.9) 8)  Anemia, Vitamin B12 Deficiency  (ICD-281.1) 9)  Hypokalemia  (ICD-276.8) 10)  Memory Loss  (ICD-780.93) 11)  Memory Loss  (ICD-780.93) 12)  Hot Flashes  (ICD-627.2) 13)  Depression  (ICD-311) 14)  Alcohol Abuse, Hx of  (ICD-V11.3) 15)  Hemorrhoids, Internal  (ICD-455.0)  Medications Prior to Update: 1)  Metamucil 30.9 %  Powd (Psyllium) .... Use As Directed.  Available Over The Counter. 2)  Cyanocobalamin 1000 Mcg/ml Inj Soln (Cyanocobalamin) .... Inject [place] Once Every Month 3)  Ativan 1 Mg  Tabs (Lorazepam) .... Take 1 Tablet By Mouth Up To  Two Times A Day As Needed For Anxiety 4)  Claritin 10 Mg  Caps (Loratadine) .... Take 1 Tablet By Mouth Once A Day For Allergies 5)  Famotidine 20 Mg  Tabs (Famotidine) .... Take 2  Tablets By Mouth Once A Day For Acid Reflux 6)  Effexor Xr 75 Mg Xr24h-Cap (Venlafaxine Hcl) .... Take 1 Tablet By Mouth Once A Day For Your Hot Flashes 7)  Warfarin Sodium 5 Mg Tabs (Warfarin Sodium) .... Take 1 Tablet By Mouth Once A Day 8)  Aspirin 325 Mg Tabs (Aspirin) .... Hold Until [employer].  Then Take 1 Tablet By Mouth Once A Day. 9)  Clonidine Hcl 0.1 Mg Tabs (Clonidine Hcl) .... Take 1 Tablet By Mouth Once A Day  Current Medications (verified): 1)  Metamucil 30.9 %  Powd (Psyllium) .... Use As Directed.  Available Over The Counter. 2)  Cyanocobalamin 1000 Mcg/ml Inj Soln (Cyanocobalamin) .... Inject [place] Once Every Month 3)  Ativan 1 Mg  Tabs (Lorazepam) .... Take 1 Tablet By Mouth Up To  Two Times A Day As Needed For Anxiety 4)  Claritin 10 Mg  Caps (Loratadine) .... Take 1 Tablet By Mouth Once A Day For Allergies 5)  Famotidine 20 Mg  Tabs (Famotidine) .... Take 2 Tablets By Mouth Once A Day For Acid Reflux 6)  Effexor Xr 75 Mg Xr24h-Cap (Venlafaxine Hcl) .... Take 1 Tablet By Mouth Once A Day For Your Hot Flashes 7)  Warfarin Sodium 5 Mg Tabs (Warfarin Sodium) .... Take 1 Tablet By Mouth Once A Day 8)  Aspirin 325 Mg Tabs (Aspirin) .... Hold Until [employer].  Then Take 1 Tablet By Mouth Once A Day. 9)  Clonidine Hcl 0.1 Mg Tabs (Clonidine Hcl) .... Take 1 Tablet By Mouth Once A Day  Allergies (verified): 1)  ! * Contrast Dye  Past History:  Past Medical History: Last updated: 04/10/2008 Rectal bleeding, 2/2 internal hemorrhoids (colonoscopy 7/07)  - bx neg for IBD Duodenitis, 2/2 H. pylori +/- NSAIDS (hpylori tx completed 8/07) History of cocaine use Alcoholism Depression and anxiety Anemia  Past Surgical History: Last updated: 08/12/2008 Caesarean section  Family History: Last updated: 05/09/2007 Family History Breast cancer 1st degree relative <50 Family History of CAD Female 1st degree relative <60 Family History of CAD Female 1st degree  relative <50 Family History Diabetes 1st degree relative  Social History: Last updated: 06/01/2009 Single Domestic Partner Current Smoker, 2-3 cigs/day Alcohol use-yes, former heavy Drug use-no, former No cocaine or alcohol use since hospital d/c 05/19/09, involved with NA  Risk Factors: Alcohol Use: 1 (01/06/2010) Exercise: yes (08/10/2009)  Risk Factors: Smoking Status: current (01/06/2010) Packs/Day: 1-2 cigs per day (01/06/2010) Passive Smoke Exposure: no (01/06/2010)  Social History: Packs/Day:  1-2 cigs per day  Review of Systems  The patient denies anorexia, fever, weight loss, chest pain,  syncope, dyspnea on exertion, peripheral edema, headaches, abdominal pain, melena, hematochezia, and severe indigestion/heartburn.    Physical Exam  General:  Well-developed,well-nourished,in no acute distress; alert,appropriate and cooperative throughout examination Lungs:  normal respiratory effort, normal breath sounds, no crackles, and no wheezes.   Heart:  normal rate, regular rhythm, no murmur, no gallop, and no rub.   Abdomen:  soft, non-tender, and no distention.   Genitalia:  normal introitus, no external lesions, mucosa pink and moist, and no adnexal masses or tenderness.   Extremities:  No clubbing, cyanosis, edema, or deformity noted with normal full range of motion of all joints.   Neurologic:  alert & oriented X3, strength normal in all extremities, and gait normal.     Impression & Recommendations:  Problem # 1:  DYSURIA (ICD-788.1) Will get formal UA and urine culture in order to be thourough and to r/o any infection. will not start treatment unless culture is positive; especially with negative dipstick UA test.   Orders: T-Urinalysis ([id number]) T-Culture, Urine ([id number])  Problem # 2:  VAGINITIS (ICD-616.10) Patient with hx of vaginitis in the past and recent complaining of occassional burning sensation, urgency and itching. Will check her urine to r/o  UTI, but since she is sexually active and do not use protection, will check for Gc and chlamydia genital probe. Her symptoms could also be secondary to yeast infection, now that she is experiencing menopause symptoms and her secretions, ph and hormones level are changing. Will wait for results of her test, vaginal physical exam was normal.  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 Alexandria Lodge.  Her updated medication list for this problem includes:    Warfarin Sodium 5 Mg Tabs (Warfarin sodium) .Marland Kitchen... Take 1 tablet by mouth once a day    Aspirin 325 Mg Tabs (Aspirin) ..... Hold until you finish lovenox.  then take 1 tablet by mouth once a day.  Problem # 4:  DEPRESSION (ICD-311) Patient depression well controlled and mood stable. Patient denies any SI or hallucinations at this point and denies any side effects coming from her medications. Will continue same regimen.  Her updated medication list for this problem includes:    Ativan 1 Mg Tabs (Lorazepam) .Marland Kitchen... Take 1 tablet by mouth up to  two times a day as needed for anxiety    Effexor Xr 75 Mg Xr24h-cap (Venlafaxine hcl) .Marland Kitchen... Take 1 tablet by mouth once a day for your hot flashes  Problem # 5:  HOT FLASHES (ICD-627.2) Patient with hot flashes and no complete resolution of her symptoms with clonidine, even after interrogation and discussion, she said is helping a little bit. will continue clonidine and will also start neurontin; no estrogen can be used, since she had hx of first degree relative with breast cancer and had a PE.  Complete Medication List: 1)  Metamucil 30.9 % Powd (Psyllium) .... Use as directed.  available over the counter. 2)  Cyanocobalamin 1000 Mcg/ml Inj Soln (Cyanocobalamin) .... Inject [place] once every month 3)  Ativan 1 Mg Tabs (Lorazepam) .... Take 1  tablet by mouth up to  two times a day as needed for anxiety 4)  Claritin 10 Mg Caps (Loratadine) .... Take 1 tablet by mouth once a day for allergies 5)  Famotidine 20 Mg Tabs (Famotidine) .... Take 2 tablets by mouth once a day for  acid reflux 6)  Effexor Xr 75 Mg Xr24h-cap (Venlafaxine hcl) .... Take 1 tablet by mouth once a day for your hot flashes 7)  Warfarin Sodium 5 Mg Tabs (Warfarin sodium) .... Take 1 tablet by mouth once a day 8)  Aspirin 325 Mg Tabs (Aspirin) .... Hold until you finish lovenox.  then take 1 tablet by mouth once a day. 9)  Clonidine Hcl 0.1 Mg Tabs (Clonidine hcl) .... Take 1 tablet by mouth once a day 10)  Neurontin 300 Mg Caps (Gabapentin) .... Take 1 tab by mouth at bedtime  Other Orders: T-PAP [facility]) [phone])  Patient Instructions: 1)  Please schedule a follow-up appointment in 4 months. 2)  Tobacco is very bad for your health and your loved ones! You Should stop smoking!. 3)  Avoid foods high in acid (tomatoes, citrus juices, spicy foods). Avoid eating within two hours of lying down or before exercising. Do not over eat; try smaller more frequent meals. Elevate head of bed twelve inches when sleeping. 4)  Take your medications as prescribed. 5)  You will be called with any abnormalities in the tests scheduled or performed today.  If you don't hear from [place] within a week from when the test was performed, you can assume that your test was normal. Prescriptions: FAMOTIDINE 20 MG  TABS (FAMOTIDINE) Take 2 tablets by mouth once a day for acid reflux  #60 x 11   Entered and Authorized by:   Vassie Loll MD   Signed by:   Vassie Loll MD on 01/06/2010   Method used:   [facility]  [address] [phone]       [address]       [place], [place]  [address]       Ph: [phone]       Fax: [phone]   RxID:   [id number] NEURONTIN 300 MG CAPS (GABAPENTIN) Take 1 tab by mouth at bedtime  #31 x 3   Entered  and Authorized by:   Vassie Loll MD   Signed by:   Vassie Loll MD on 01/06/2010   Method used:   [facility]  [address] [id number]       [address]       [place], [place]  [address]       Ph: [phone]       Fax: [phone]   RxID:   [id number]   Prevention & Chronic Care Immunizations   Influenza vaccine: Fluvax Non-MCR  (08/10/2009)   Influenza vaccine deferral: Deferred  (01/06/2010)    Tetanus booster: 06/01/2009: Td   Td booster deferral: Deferred  (01/06/2010)    Pneumococcal vaccine: Not documented   Pneumococcal vaccine deferral: Deferred  (01/06/2010)  Other Screening   Pap smear: Specimen Adequacy: Satisfactory for evaluation.   Interpretation/Result:Negative for intraepithelial Lesion or Malignancy.   Interpretation/Result:Trichomonas Vaginalis present.    Location: [facility].    (09/21/2008)   Pap smear action/deferral: Ordered  (01/06/2010)   Pap smear due: 09/21/2009    Mammogram: ASSESSMENT: Negative - BI-RADS 1^MM DIGITAL SCREENING  (05/12/2009)   Mammogram action/deferral: Deferred  (01/06/2010)   Mammogram due: 05/12/2010   Smoking status: current  (01/06/2010)   Smoking cessation counseling: yes  (01/06/2010)  Lipids   Total Cholesterol: 202  (  04/15/2008)   LDL: 99  (04/15/2008)   LDL Direct: Not documented   HDL: 92  (04/15/2008)   Triglycerides: 56  (04/15/2008)   Nursing Instructions: Pap smear today   Process Orders Check Orders Results:     [facility]: ABN not required for this insurance Tests Sent for requisitioning (January 06, 2010 2:55 PM):     01/06/2010: [facility] -- T-Urinalysis [[id number]] (signed)     01/06/2010: [facility] -- T-Culture, Urine [[id number]] (signed)     01/06/2010: [facility] -- T-Wet Prep by Molecular Probe [id number] (signed)     01/06/2010: [facility] -- T-Chlamydia & GC Probe, Genital [[id number]] (signed)     Vital Signs:  Patient profile:   50 year old female Height:      64 inches (162.56 cm) Weight:      136.04 pounds (61.84 kg) BMI:     23.44 Temp:     97.7 degrees F (36.50 degrees C) oral Pulse rate:   100 / minute BP sitting:   112 / 71  (right arm)  Vitals Entered By: Angelina Ok RN (January 06, 2010 11:01 AM)    Laboratory Results   Urine Tests  Date/Time Recieved: 01/06/2010 11:13 AM Date/Time Reported:  01/06/2010 11:16 AM  Routine Urinalysis   Color: yellow Appearance: Hazy Glucose: negative   (Normal Range: Negative) Bilirubin: negative   (Normal Range: Negative) Ketone: negative   (Normal Range: Negative) Spec. Gravity: 1.015   (Normal Range: 1.003-1.035) Blood: negative   (Normal Range: Negative) pH: 5.5   (Normal Range: 5.0-8.0) Protein: negative   (Normal Range: Negative) Urobilinogen: 0.2   (Normal Range: 0-1) Nitrite: negative   (Normal Range: Negative) Leukocyte Esterace: negative   (Normal Range: Negative)

## 2010-12-01 NOTE — Assessment & Plan Note (Addendum)
Summary: COU/SB.  Anticoagulant Therapy Managed by: Barbera Setters. Janie Morning  PharmD CACP PCP: Vassie Loll MD [facility] Attending: Donia Guiles MD Indication 1: Pulmonary  embolus Indication 2: Encounter for therapeutic drug monitoring  V58.83 Start date: 05/16/2009 Duration: 1 year  Patient Assessment Reviewed by: Chancy Milroy PharmD  November 21, 2010 Medication review: verified warfarin dosage & schedule,verified previous prescription medications, verified doses & any changes, verified new medications, reviewed OTC medications, reviewed OTC health products-vitamins supplements etc Complications: none Dietary changes: none   Health status changes: none   Lifestyle changes: none   Recent/future hospitalizations: none   Recent/future procedures: none   Recent/future dental: none Patient Assessment Part 2:  Have you MISSED ANY DOSES or CHANGED TABLETS?  YES. Has missed the past month of warfarin. She is still "out".  Have you had any BRUISING or BLEEDING ( nose or gum bleeds,blood in urine or stool)?  No reported bruising or bleeding in nose, gums, urine, stool.  Have you STARTED or STOPPED any MEDICATIONS, including OTC meds,herbals or supplements?  No other medications or herbal supplements were started or stopped.  Have you CHANGED your DIET, especially green vegetables,or ALCOHOL intake?  No changes in diet or alcohol intake.  Have you had any ILLNESSES or HOSPITALIZATIONS?  No reported illnesses or hospitalizations  Have you had any signs of CLOTTING?(chest discomfort,dizziness,shortness of breath,arms tingling,slurred speech,swelling or redness in leg)    No chest discomfort, dizziness, shortness of breath, tingling in arm, slurred speech, swelling, or redness in leg.     Treatment  Target INR: 2.0-3.0 INR: 1.0  Date: 11/21/2010 Regimen In:  65.0mg /week INR reflects regimen in: 1.0       Comments: Discussed with Attending Physician, Dr. Donia Guiles. 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 Patient is asymptomatic. Patient was counseled regarding signs and symptoms of recurrence of VTE for which she states an understanding of her responsibility to RTC or the ED should they occur.  Allergies: 1)  ! * Contrast Dye

## 2010-12-11 ENCOUNTER — Encounter: Payer: Self-pay | Admitting: Internal Medicine

## 2010-12-26 ENCOUNTER — Ambulatory Visit (INDEPENDENT_AMBULATORY_CARE_PROVIDER_SITE_OTHER): Payer: Self-pay | Admitting: Internal Medicine

## 2010-12-26 VITALS — BP 130/88 | HR 85 | Temp 97.2°F | Wt 129.7 lb

## 2010-12-26 DIAGNOSIS — F329 Major depressive disorder, single episode, unspecified: Secondary | ICD-10-CM

## 2010-12-26 DIAGNOSIS — R109 Unspecified abdominal pain: Secondary | ICD-10-CM

## 2010-12-26 DIAGNOSIS — IMO0002 Reserved for concepts with insufficient information to code with codable children: Secondary | ICD-10-CM

## 2010-12-26 DIAGNOSIS — T7421XA Adult sexual abuse, confirmed, initial encounter: Secondary | ICD-10-CM

## 2010-12-26 DIAGNOSIS — R1030 Lower abdominal pain, unspecified: Secondary | ICD-10-CM

## 2010-12-26 MED ORDER — CLONIDINE HCL 0.1 MG PO TABS: ORAL_TABLET | ORAL | Status: DC

## 2010-12-26 MED ORDER — FAMOTIDINE 20 MG PO TABS: 20.0000 mg | ORAL_TABLET | Freq: Two times a day (BID) | ORAL | Status: DC

## 2010-12-26 NOTE — Patient Instructions (Signed)
Pls try to take Ibuprofen for your groin pain. You can take up to 600mg  three times a day. Make sure you take it with food. Let us know if you have any questions or concerns. Pls call Dorothe Pea to schedule an appointment for counseling.

## 2010-12-27 DIAGNOSIS — R1030 Lower abdominal pain, unspecified: Secondary | ICD-10-CM | POA: Insufficient documentation

## 2010-12-27 DIAGNOSIS — IMO0002 Reserved for concepts with insufficient information to code with codable children: Secondary | ICD-10-CM | POA: Insufficient documentation
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
## 2010-12-27 NOTE — Assessment & Plan Note (Addendum)
Etiology unclear. She does not have any ROM issues at the hip joints and weight bearing is not affected either. No evidence of inguinal or femoral hernia on exam. Question if this is likely 2/2 muscle strain vs hip joint involvement vs referred pain from lumbar spinal nerve root involvement. For now, will manage conservatively. Patient will be instructed to rest, stay hydrated and take Ibuprofen on an as needed basis with food at all times. If this persists, then she may need further work up -perhaps a plain film to assess the hip joints or referral to physical therapy.

## 2010-12-29 ENCOUNTER — Telehealth: Payer: Self-pay | Admitting: Licensed Clinical Social Worker
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
## 2011-01-02 ENCOUNTER — Ambulatory Visit (INDEPENDENT_AMBULATORY_CARE_PROVIDER_SITE_OTHER): Payer: Self-pay | Admitting: Internal Medicine

## 2011-01-02 ENCOUNTER — Other Ambulatory Visit: Payer: Self-pay | Admitting: Internal Medicine

## 2011-01-02 ENCOUNTER — Encounter: Payer: Self-pay | Admitting: Internal Medicine

## 2011-01-02 DIAGNOSIS — Z1231 Encounter for screening mammogram for malignant neoplasm of breast: Secondary | ICD-10-CM

## 2011-01-02 DIAGNOSIS — IMO0002 Reserved for concepts with insufficient information to code with codable children: Secondary | ICD-10-CM

## 2011-01-02 DIAGNOSIS — I2699 Other pulmonary embolism without acute cor pulmonale: Secondary | ICD-10-CM

## 2011-01-02 DIAGNOSIS — R1030 Lower abdominal pain, unspecified: Secondary | ICD-10-CM

## 2011-01-02 DIAGNOSIS — Z0000 Encounter for general adult medical examination without abnormal findings: Secondary | ICD-10-CM | POA: Insufficient documentation

## 2011-01-02 DIAGNOSIS — T7421XA Adult sexual abuse, confirmed, initial encounter: Secondary | ICD-10-CM

## 2011-01-02 DIAGNOSIS — R109 Unspecified abdominal pain: Secondary | ICD-10-CM

## 2011-01-02 NOTE — Assessment & Plan Note (Signed)
She is concerned that she has lost 9 lbs over the last week. Looking over our records she does have a 9 lb difference in her weight between today's and last week's visit but this could be an error. She denies any blood in stools. Her last colonoscopy was in 2007 which showed some internal hemorrhoids and small ulcers in terminal ileum likely secondary to NSAIDS. Her last mammogram was a year ago and essentially normal-she was referred for a new one today. Her last pap smear was a year ago which was negative for malignancy. She says that she smokes currently and was advised to quit. She was advised to follow up with [place] sooner than later if she continues to loose weight. She got a flu shot today.

## 2011-01-02 NOTE — Assessment & Plan Note (Signed)
She denies any depressed mood although  she has some associated post traumatic stress disorder.  A referral to Lorri Frederick was done for the arrangements for psychotherapy and counseling during the last visit. She was offered some services and as per  the patient she  has an appointment with [facility] around  March 15th ( she is not sure about the date) . She was encouraged not to miss her appointment

## 2011-01-02 NOTE — Assessment & Plan Note (Signed)
Seen for this complaint in the clinic a week ago. Denies any groin pain at today's visit and says ibuprofen is helping her. Therefore did not initiate any further workup.
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
## 2011-01-02 NOTE — Patient Instructions (Addendum)
Please take your medicines as prescribed. Please follow up with [facility] outpatient [facility] in 3 months or earlier if needed. Please do not miss your appointment with Behavioral health.

## 2011-01-02 NOTE — Assessment & Plan Note (Signed)
She was advised to stop taking warfarin  in January 2012 given her noncompliance and also given,  that her PE was most likely first unprovoked in July 2010 for which she was treated with 8 months of anticoagulation therapy.Marland Kitchen

## 2011-01-04 ENCOUNTER — Ambulatory Visit (HOSPITAL_COMMUNITY)
Admission: RE | Admit: 2011-01-04 | Discharge: 2011-01-04 | Disposition: A | Discharge status: Home / Self Care | Payer: Self-pay | Source: Ambulatory Visit | Attending: Internal Medicine | Admitting: Internal Medicine

## 2011-01-04 DIAGNOSIS — Z1231 Encounter for screening mammogram for malignant neoplasm of breast: Secondary | ICD-10-CM | POA: Insufficient documentation

## 2011-01-10 LAB — POCT PREGNANCY, URINE: Preg Test, Ur: NEGATIVE

## 2011-02-05 LAB — BASIC METABOLIC PANEL
Calcium: 8 mg/dL — ABNORMAL LOW (ref 8.4–10.5)
Calcium: 8.7 mg/dL (ref 8.4–10.5)
Chloride: 107 mEq/L (ref 96–112)
Creatinine, Ser: 0.65 mg/dL (ref 0.4–1.2)
GFR calc Af Amer: >60 mL/min (ref >60)
GFR calc Af Amer: >60 mL/min (ref >60)
GFR calc non Af Amer: >60 mL/min (ref >60)
Sodium: 140 mEq/L (ref 135–145)
Sodium: 141 mEq/L (ref 135–145)

## 2011-02-05 LAB — RAPID URINE DRUG SCREEN, HOSP PERFORMED
Amphetamines: NOT DETECTED
Barbiturates: NOT DETECTED
Benzodiazepines: NOT DETECTED
Opiates: NOT DETECTED

## 2011-02-05 LAB — POCT I-STAT, CHEM 8
BUN: 16 mg/dL (ref 6–23)
Creatinine, Ser: 0.8 mg/dL (ref 0.4–1.2)
Potassium: 4.9 mEq/L (ref 3.5–5.1)
Sodium: 138 mEq/L (ref 135–145)
TCO2: 23 mmol/L (ref 0–100)

## 2011-02-05 LAB — PROTEIN C ACTIVITY: Protein C Activity: 139 % — ABNORMAL HIGH (ref 75–133)

## 2011-02-05 LAB — PROTIME-INR
INR: 1 (ref 0.00–1.49)
Prothrombin Time: 12.9 seconds (ref 11.6–15.2)
Prothrombin Time: 14.7 seconds (ref 11.6–15.2)

## 2011-02-05 LAB — URINALYSIS, MICROSCOPIC ONLY
Bilirubin Urine: NEGATIVE
Hgb urine dipstick: NEGATIVE
Ketones, ur: 40 mg/dL — AB
Specific Gravity, Urine: 1.007 (ref 1.005–1.030)
Urobilinogen, UA: 1 mg/dL (ref 0.0–1.0)

## 2011-02-05 LAB — CBC
HCT: 30.6 % — ABNORMAL LOW (ref 36.0–46.0)
HCT: 35.3 % — ABNORMAL LOW (ref 36.0–46.0)
Hemoglobin: 10.1 g/dL — ABNORMAL LOW (ref 12.0–15.0)
Hemoglobin: 12 g/dL (ref 12.0–15.0)
MCHC: 34 g/dL (ref 30.0–36.0)
MCHC: 34 g/dL (ref 30.0–36.0)
MCHC: 34 g/dL (ref 30.0–36.0)
MCV: 94.9 fL (ref 78.0–100.0)
MCV: 95.5 fL (ref 78.0–100.0)
MCV: 95.7 fL (ref 78.0–100.0)
Platelets: 197 10*3/uL (ref 150–400)
RBC: 3.26 MIL/uL — ABNORMAL LOW (ref 3.87–5.11)
RBC: 3.69 MIL/uL — ABNORMAL LOW (ref 3.87–5.11)
RDW: 15.1 % (ref 11.5–15.5)
RDW: 15.3 % (ref 11.5–15.5)
WBC: 6 10*3/uL (ref 4.0–10.5)
WBC: 7 10*3/uL (ref 4.0–10.5)

## 2011-02-05 LAB — LIPID PANEL
HDL: 79 mg/dL (ref >39)
VLDL: 78 mg/dL — ABNORMAL HIGH (ref 0–40)

## 2011-02-05 LAB — HEMOCCULT GUIAC POC 1CARD (OFFICE): Fecal Occult Bld: NEGATIVE

## 2011-02-05 LAB — DIFFERENTIAL
Band Neutrophils: 0 % (ref 0–10)
Basophils Absolute: 0 10*3/uL (ref 0.0–0.1)
Basophils Relative: 0 % (ref 0–1)
Lymphocytes Relative: 24 % (ref 12–46)
Lymphs Abs: 1.2 10*3/uL (ref 0.7–4.0)
Metamyelocytes Relative: 0 %
Promyelocytes Absolute: 0 %

## 2011-02-05 LAB — PROTEIN S ACTIVITY: Protein S Activity: 98 % (ref 69–129)

## 2011-02-05 LAB — HEMOGLOBIN A1C
Hgb A1c MFr Bld: 5.2 % (ref 4.6–6.1)
Mean Plasma Glucose: 103 mg/dL

## 2011-02-05 LAB — CARDIAC PANEL(CRET KIN+CKTOT+MB+TROPI)
CK, MB: 0.8 ng/mL (ref 0.3–4.0)
CK, MB: 1 ng/mL (ref 0.3–4.0)
Relative Index: INVALID (ref 0.0–2.5)
Relative Index: INVALID (ref 0.0–2.5)
Total CK: 109 U/L (ref 7–177)
Total CK: 80 U/L (ref 7–177)
Troponin I: 0.06 ng/mL (ref 0.00–0.06)
Troponin I: 0.06 ng/mL (ref 0.00–0.06)

## 2011-02-05 LAB — POCT CARDIAC MARKERS
CKMB, poc: <1 ng/mL — ABNORMAL LOW (ref 1.0–8.0)
Myoglobin, poc: 27.6 ng/mL (ref 12–200)
Troponin i, poc: <0.05 ng/mL (ref 0.00–0.09)

## 2011-02-05 LAB — LUPUS ANTICOAGULANT PANEL: Lupus Anticoagulant: NOT DETECTED

## 2011-02-05 LAB — TSH: TSH: 2.447 u[IU]/mL (ref 0.350–4.500)

## 2011-02-05 LAB — PROTEIN S, TOTAL: Protein S Ag, Total: 119 % (ref 70–140)

## 2011-02-05 LAB — CARDIOLIPIN ANTIBODIES, IGG, IGM, IGA: Anticardiolipin IgG: <10 [GPL'U] — ABNORMAL LOW (ref <11)

## 2011-02-05 LAB — HEPATITIS PANEL, ACUTE
Hep A IgM: NEGATIVE
Hepatitis B Surface Ag: NEGATIVE

## 2011-02-05 LAB — FACTOR 5 LEIDEN

## 2011-02-05 LAB — PROTEIN C, TOTAL: Protein C, Total: 121 % (ref 70–140)

## 2011-02-05 LAB — HOMOCYSTEINE: Homocysteine: 51.2 umol/L — ABNORMAL HIGH (ref 4.0–15.4)

## 2011-02-05 LAB — HEPARIN LEVEL (UNFRACTIONATED)
Heparin Unfractionated: 0.36 IU/mL (ref 0.30–0.70)
Heparin Unfractionated: 0.4 IU/mL (ref 0.30–0.70)
Heparin Unfractionated: 0.42 IU/mL (ref 0.30–0.70)

## 2011-03-14 NOTE — Consult Note (Signed)
NAMEHIEN, CUNLIFFE NO.:  [id number]   MEDICAL RECORD NO.:  [id number]          PATIENT TYPE:  INP   LOCATION:  6729                         FACILITY:  [facility]   PHYSICIAN:  Wendi Snipes, MD DATE OF BIRTH:  December 09, 1960   DATE OF CONSULTATION:  DATE OF DISCHARGE:                                 CONSULTATION   CARDIOLOGIST:  None.   PRIMARY CARE DOCTOR:  Chauncey Reading.   CHIEF COMPLAINT:  Chest pain.   HISTORY OF PRESENT ILLNESS:  This is a 50 year old African American  female without previous cardiac history.  Here with chest pain for  approximately 8 days.  She states that she had been having breast pain  since her mammogram on 7/10.  She states that the bilateral chest pain  excludes the middle of her chest and is tender to palpation when people  examine her.  She experienced shortness of breath and nausea and  vomiting today, and she reports to the ER because the pain is not  resolving.  She is here and found to be tachycardiac and a code STEMI  was called because of new left bundle branch block on her EKG.  This was  called off after initial evaluation and further investigation yields  that she used cocaine yesterday and she has been drinking alcohol.   PAST MEDICAL HISTORY:  1. Alcohol abuse.  2. Seizure disorder.  3. Substance abuse.  4. Depression.   ALLERGIES:  NO KNOWN DRUG ALLERGIES, ALTHOUGH SHE STATES THAT SHE IS  ALLERGIC TO IV DYE THOUGH IT GIVES HER ANXIETY.   MEDICATIONS:  None.   SOCIAL HISTORY:  She lives in [place] with her friends.  She is  currently unemployed.  She smokes about 5-6 cigarettes per day, and she  has done this for the past 30 years.   FAMILY HISTORY:  No early coronary artery disease.   REVIEW OF SYSTEMS:  All 14 systems were reviewed were negative except as  mentioned in detail in HPI.   PHYSICAL EXAMINATION:  VITAL SIGNS:  Blood pressure is 123/75,  respiratory 16, pulse is 109.  She is satting 100%  on 2 liters nasal  cannula.  GENERAL:  She is a 50 year old African American female appearing stated  age in no acute distress.  HEENT:  Moist mucous membranes.  Pupils equal, round, react to light  accommodation.  Anicteric sclera.  NECK:  No jugular venous distention.  No thyromegaly.  CARDIOVASCULAR:  Regular rate and rhythm, no murmurs, rubs or gallops.  LUNGS:  Clear to auscultation bilaterally.  ABDOMEN:  Nontender, nondistended.  Positive bowel sounds.  No masses.  EXTREMITIES:  There is clubbing, cyanosis, edema.  MUSCULOSKELETAL:  She had reproducible chest pain above both of her  breasts.  NEUROLOGIC:  Alert and x3.  Cranial nerves II-XII grossly intact,  nonfocal neurologic deficits.  SKIN:  Warm, dry intact.  No rashes.  Psych mood and affect are  appropriate.   RADIOLOGY:  Chest x-ray showed no acute cardiopulmonary process.  EKG  showed a normal sinus tachycardia with a rate of 105 beats per minute  without ischemic ST changes with a new left bundle branch block which  was new from 2007.   LABORATORY REVIEW:  White blood cell count 5, hematocrit 35, her  platelet count is 204, creatinine is 0.8.  Her troponins were negative.  Her alcohol level was 250.   ASSESSMENT/PLAN:  This is a 50 year old African American female here  with chest pain after a mammogram approximately a week ago, here with a  new left bundle branch block and tachycardia who is currently  intoxicated.   Left bundle branch block.  This is very likely rate related and unlikely  represents ongoing ischemia.  Will check an echocardiogram for  structural heart disease as this may represent longstanding hypertension  or undiagnosed or untreated chronic condition.  Otherwise this is likely  to resolve as her tachycardia resolves, and please investigate this  further.  She is possibly a candidate for a PE, and we would suggest a  CTPA to rule this out.  Otherwise, continue to rule out myocardial   infarction with cardiac enzymes and continue her aspirin.      Wendi Snipes, MD  Electronically Signed     [facility]/MEDQ  D:  05/17/2009  T:  05/17/2009  Job:  [id number]

## 2011-03-14 NOTE — Discharge Summary (Signed)
NAMESHAUNTE, TUFT             ACCOUNT NO.:  [id number]   MEDICAL RECORD NO.:  [id number]          PATIENT TYPE:  INP   LOCATION:  6729                         FACILITY:  [facility]   PHYSICIAN:  Madaline Guthrie, M.D.    DATE OF BIRTH:  Oct 30, 1961   DATE OF ADMISSION:  05/16/2009  DATE OF DISCHARGE:  05/19/2009                               DISCHARGE SUMMARY   DISCHARGE DIAGNOSES:  1. Pulmonary embolism, stable on appropriate medical therapy at the      time of discharge.  2. New left bundle-branch block, resolved by the time of discharge.  3. Polysubstance abuse.  4. Trichomoniasis, treated with appropriate medical therapy.  5. History of B12 deficient anemia.  6. History of sexually transmitted diseases.   DISCHARGE MEDICATIONS:  1. Metamucil 30.9% powder use as directed.  2. Cyanocobalamin 1000 mcg/mL injection, inject 1000 mcg      subcutaneously once every month.  3. Ativan 1 mg p.o. b.i.d. p.r.n. anxiety.  4. Claritin 10 mg by mouth once a day for allergies.  5. Famotidine 40 mg by mouth once a day for acid reflux.  6. Effexor XR 75 mg by mouth once a day for hot flashes.  7. Lovenox inject 55 mg subcutaneously every 12 hours for 5 days.  8. NicoDerm CQ 14 mg place 1 patch on skin daily as needed for smoking      cessation, remove previous day's patch for putting on the new one.  9. Warfarin sodium 5 mg by mouth once a day.  10.Aspirin 325 mg to be held until Lovenox course has finished, then      take 1 tablet by mouth once a day.   DISPOSITION AND FOLLOWUP:  The patient is to follow up with Dr. Theotis Barrio at  [facility] on June 01, 2009 at 10:30 a.m.  The  patient is also to follow up with Dr. Alexandria Lodge at [facility] on May 21, 2009 at 9:00 a.m.  During the followup with Dr.  Theotis Barrio, he needs to please evaluate the patient's adherence to new  medicine regimen, follow up on symptoms including shortness of breath  and chest pain and continue  counseling the patient regarding her  substance abuse.  No specific labs need to be checked at that time and  follow up with Dr. Alexandria Lodge, he will manage her Coumadin regimen and to  have continue follow up with her as necessary.   PROCEDURES PERFORMED:  1. Portable chest x-ray on May 16, 2009, which showed no active      cardiopulmonary process.  A CT angio of the chest on May 16, 2009,      which showed;      a.     Small but definite right-sided pulmonary embolic burden, no       evidence of right heart strain or pulmonary arterial hypertension.      b.     Mild cardiomegaly.      c.     Mild motion degraded exam.      d.     Fatty infiltration of the  liver.  2. Also EKG report from May 16, 2009, which showed evidence of new      left bundle-branch block with a ventricular rate of 109.  3. An EKG report from May 17, 2009, which showed resolution of her      bundle-branch block at a rate of 91 beats per minute.   CONSULTATIONS:  Cathlean Cower. Marcelle Overlie, MD with Cardiology was consulted on  the patient.   BRIEF ADMITTING HISTORY AND PHYSICAL:  The patient is a 50 year old  female with past medical history significant for history of CVA,  alcoholism, and cocaine abuse, who presents to the ED with chest pain  for the past 8 days and shortness of breath for 1 day.  She reported  that chest pain began after getting a mammogram.  She says that the pain  is sharp in character in her bilateral breasts at 10/10.  She took pain  meds, which did not help.  She has had some worsening pain on the day of  admission with an associated shortness of breath.  She denies leg pain  or history of DVT.  She reports using cocaine 3 days prior to admission  but reports that the pain was present before cocaine use.  She also  reports frequent and excessive alcohol use including 2 beers and 2  drinks on those days.  Endorses nausea, but denies vomiting or diarrhea.  Only other symptom is some pain with  urination.   PAST MEDICAL HISTORY:  1. Rectal bleeding secondary to internal hemorrhoids with a      colonoscopy from 2007 and a negative biopsy for IBD.  2. Duodenitis secondary to H. pylori plus or minus NSAIDs.  3. History of cocaine use.  4. Alcoholism.  5. History of CVA.  6. Depression and anxiety.  7. Anemia.   VITAL SIGNS ON ADMISSION:  Temperature 98.1, pulse 109, blood pressure  122/78, respirations 18, and O2 sat is 100% on 2 L.   PHYSICAL EXAMINATION:  LUNGS:  Normal respiratory effort.  Normal breath  sounds.  No crackles.  No wheezes.  Clear to auscultation bilaterally.  CARDIAC:  Tachycardiac.  Regular rhythm, 2/6 systolic murmur.  No  gallop.  No rub.  ABDOMEN:  Soft and nontender.  Normal bowel sounds.  No distention.  No  guarding.  No rebound tenderness.  No hepatomegaly.  No splenomegaly.  NEUROLOGIC:  Nonfocal except for a lateral sensory deficit on her  lateral aspect of her left lower leg, which she reported was not new.   ADMISSION LABORATORY DATA:  Multiple point-of-care cardiac enzymes were  within normal limits.  D-dimer  0.86.  Alcohol was 289.  White count  5.0, hemoglobin 12.0, hematocrit 35.3, and platelet count 204.  Sodium  138, potassium 4.9, chloride 109, BUN 16, creatinine 0.8, bicarb 23, and  glucose 56.   HOSPITAL COURSE:  1. Pulmonary embolism.  The patient was found to have a pulmonary      embolus by CT angio done in the ED.  The patient was started on      heparin, which was changed to Lovenox by the day of discharge.  The      patient was also started on Coumadin 1 day prior to discharge.  The      patient was not therapeutic on her INR at the time discharge, but      was on Lovenox bridge and was to be followed up closely by Dr.  Groce in Outpatient Clinic.  The patient had no symptoms of      difficulty breathing or chest pain following her day of admission.      The patient is to be continued on anticoagulation for a period of       at least 6 months.  2. Left bundle-branch block.  The patient was initially started on IV      heparin as well as nitroglycerin as the initial concern was for      acute coronary syndrome.  However, her enzymes remained within      normal limits.  Cardiology was consulted and felt that her left      bundle-branch block was likely more rate related as opposed to      evidence of an acute coronary syndrome.  The patient's bundle-      branch block did in fact resolve with subsequent EKGs and on      telemetry as her rate decreased.  3. Polysubstance abuse.  The patient was counseled extensively by both      myself and social work regarding the risks associated with her      continued cocaine and alcohol use.  She was specifically counseled      by Dr. Theotis Barrio regarding the risk involved of both not taking her      Coumadin as prescribed and then using cocaine while taking her      Coumadin.  She was managed on CIWA protocol for alcohol abuse while      she was inpatient.  Upon discussion with social work, the patient      appeared to have a strong desire to get involved with Narcotics      Anonymous and Alcoholic Anonymous.  She was given information      regarding these.  4. Trichomoniasis.  On UA, the patient was found to have an infection      with Trichomonas species.  She was treated with 1 dose of Flagyl      and was advised that her partner also be treated.  5. History of STDs.  The patient's current Trichomoniasis infection      was discussed above given her history of risky behavior.  The      patient had hepatitis panel checked, which was found to be      completely negative and also had an HIV test, which was      nonreactive.  She was counseled about the risk of such behavior.  6. History of B12 deficient anemia.  The patient was already on      therapy for B12 deficiency and was told to continue this as an      outpatient.   DAY OF DISCHARGE VITAL SIGNS:  Temperature 97.6,  pulse 76, respirations  14, blood pressure 131/95, and sating 100% on room air.   DAY OF DISCHARGE LABORATORY DATA:  Sodium 141, potassium 4.2, chloride  107, bicarb 26, BUN 2, creatinine 0.65, calcium 8.7, and glucose 108.  White blood cells 7.0, hemoglobin 10.5, hematocrit 31.0, and platelet  count 199.      Brooks Sailors, MD  Electronically Signed      Madaline Guthrie, M.D.  Electronically Signed    KS/MEDQ  D:  05/24/2009  T:  05/25/2009  Job:  [id number]   cc:   Rosanna Randy, MD

## 2011-03-17 NOTE — Discharge Summary (Signed)
NAMEHANNELORE, [name]             ACCOUNT NO.:  [id number]   MEDICAL RECORD NO.:  [id number]          PATIENT TYPE:  INP   LOCATION:  5031                         FACILITY:  [facility]   PHYSICIAN:  C. Ulyess Mort, M.D.DATE OF BIRTH:  December 20, 1960   DATE OF ADMISSION:  04/23/2006  DATE OF DISCHARGE:  04/26/2006                                 DISCHARGE SUMMARY   DISCHARGE DIAGNOSES:  1.  Epigastric pain with intermittent rectal bleeding, likely secondary to      internal hemorrhoids versus inflammatory bowel disease.  2.  Helicobacter pylori positive serology.  3.  Duodenitis.  4.  Cocaine abuse.  5.  Major depressive disorder.  6.  Alcohol abuse, chronic.  7.  Mood disorder.  8.  Trichomonas vaginalis infection, status post 2 grams metronidazole      treatment.   DISCHARGE MEDICATIONS:  Sertraline 50 mg 1 tablet daily.   FOLLOWUP APPOINTMENT:  Chauncey Reading, D.O. outpatient clinic on May 09, 2006, at 9:45 a.m.   CONSULTATIONS:  Gastroenterology.  Date of consultation April 23, 2006.   PROCEDURES:  1.  Esophagogastroduodenoscopy, demonstrating mild duodenitis.  2.  Colonoscopy, demonstrating internal hemorrhoids, with shallow      ulcerations in the terminal ileum.  Differential diagnosis include      inflammatory bowel disease versus nonsteroidal anti-inflammatory abuse      with benign biopsies.  3.  2-dimensional echocardiogram, demonstrating ejection fraction within      normal limits.  No wall motion abnormalities.  Moderate mitral annular      calcification with moderate mitral valve regurgitation and eccentric      mitral regurgitation jet.   HISTORY OF PRESENT ILLNESS:  [name] is a 50 year old African-American  female with past medical history significant alcohol abuse with  rehabilitation trials in the past, who presented to the emergency room with  complaints of two days of epigastric abdominal pain, described as dull with  nausea, without any vomiting.  Also  having a dark-red stool per rectum, the  day prior to admission and bright red blood per rectum the morning of  admission with brown stools.  There was blood noted in the toilet water.  She was not sure how much bleeding there was.  She stated she used crack-  cocaine two months ago; however, the urine drug screen was positive for  cocaine during this admission.  Otherwise, the patient was still having some  mild amounts of abdominal pain.  In the emergency room, she was hemoccult  positive by the emergency room physician with no prior history of GI  evaluation in the past.  In addition, the patient was treated for severe  depression by behavioral health and had been on lithium, Cymbalta, trazodone  in the past, but had to discontinue this secondary to financial reasons.  She was previously a patient of [facility], but was dismissed from their  practice because per the patient, her boyfriend made too much money to  continue receiving benefits there.   SOCIAL HISTORY:  The patient smokes about 2 to 3 cigarettes per day for the  last 30 years,  consuming at least an alcoholic drink per day and at least  once a week using cocaine.  She is single, unemployed and has made it to the  11th grade.   FAMILY HISTORY:  Significant for father dying of alcoholic complications in  his 25s.  Mom was still alive with hypertension and coronary artery disease.  Of her six siblings, two of her brothers are still suffering from  alcoholism.   PHYSICAL EXAMINATION:  VITAL SIGNS:  Temperature 97.5, blood pressure  134/94, pulse 93, respirations 18, O2 sat 100% on room air.  GENERAL:  Essentially only remarkable for significant weakness with mild mid-  epigastric tenderness with deep palpation.  She is no rebound or guarding.  No hepatosplenomegaly was appreciated.  Hemoccult was positive and by the  emergency room physician, there was no acute rashes or petechia noted.   LABORATORY STUDIES:  Sodium 137,  potassium 4.3, chloride 102, bicarb 23, BUN  13, creatinine 0.9, glucose 77.  Bilirubin 1.6, AST 77, ALT 35, alk phos 54.  Total protein 7.4, albumin 4, calcium 9.1.  Hemoglobin 12.2, hemoglobin was  11.2 in April 2006.  White blood cell count 6.6, platelets 290,000 with a  hematocrit of 35.6.  PT was 13.  GGT 106.  Urine drug screen is positive for  cocaine.   HOSPITAL COURSE:  1.  Rectal and GI bleeding.  The patient initially had CBCs drawn and      monitored throughout this admission, but did not drop significantly.      Gastroenterology was consulted because of the nature of the patient's      history, alcohol abuse, as well as concern for pancreatitis, amylase and      lipase were drawn, which were unremarkable.  Because of the differential      diagnosis, GI recommended EGD, and if negative, proceeding with      colonoscopy.  The patient underwent these procedures, after receiving IV      Pepcid in the emergency room, which did help the patient's pain      somewhat.  EGD was performed on April 24, 2006, which was essentially      remarkable for mild duodenitis with no evidence of ulcerative disease.      She underwent colonoscopy on April 25, 2006, which demonstrated shallow      ulcerations of the terminal ileum.  Biopsy was performed, which      pathology did reveal no disease, but internal hemorrhoids were noted.      The patient was encouraged to stop using NSAID therapy.  The patient's      hemoglobin never decreased during this admission.  She was advanced to      regular diet and tolerated this appropriately and did not require any      further treatment.   1.  Mitral regurgitation.  The patient, by the day prior to discharge, was      noted to have a murmur to the left lateral sternal border with no prior      history of echocardiogram and new onset murmur, 2-D echocardiogram did     reveal mitral regurgitation.  However, there was no evidence of      congestive heart  failure.   1.  Trichomonas vaginalis infection.  Initially, urinalysis demonstrated too      numerous to count red blood cells with large blood and trichomonas as      well as greater than 80 mg of ketones and 30  of protein and small      leukocyte esterase, it was felt that her hemorrhagic cystitis was      secondary to trichomonas.  She was treated with 2 grams of      metronidazole; however, by the day prior to discharge, the patient had a      repeat urinalysis, which still demonstrated large hemoglobin; however,      the red blood cells were essentially gone.  It was, therefore, decided      to check for LDH and haptoglobin to effective rule out any evidence of      hemolytic anemia.  LDH was performed, which was 144, which was within      normal limits.  Peripheral smear of the CBC did not reveal any evidence      of hemolytic anemia.  Haptoglobin was pending at the time of this      dictation; however, it was not felt that the patient had did have any      evidence of hemolytic anemia.  Urine for hemosiderin was also ordered      and this was pending at the time of this dictation.  The patient will      have a repeat urinalysis, as well as a BMET and CBC at her followup      appointment.   1.  Cocaine abuse, as well as chronic alcoholism.  The patient was seen by      social work during this admission, and was given information related to      outpatient rehabilitation to include Alcoholics Anonymous, Narcotics      Anonymous, and ADS phone number.  The patient was opened to receiving      local resources and said that she would follow up as an outpatient.   1.  Normocytic anemia, likely secondary to chronic alcohol abuse.  The      patient definitely has some evidence of suppression with her chronic      alcoholism, as her hemoglobin over the last few years has not dropped      significantly.  Ferritin was noted to be 295 and as now, the patient has      been ruled out for any  evidence of malignancy, this will need to be      followed up as an outpatient.   1.  Depression.  The patient was started on Zoloft therapy during this      admission, as the patient did have history by prior discharge diagnosis      of a mood disorder.  She was strongly encouraged to follow up with      mental health to resume appropriate counseling, given the patient's      tendency for polysubstance abuse.  She was advised to obtain this      prescription and stay on it with titration to be continued as an      outpatient.   Discharge laboratory studies pertinent to this hospitalization:  Hemoglobin  10.3, hematocrit 30.1, platelets 240,000.  BUN less than 1, creatinine 0.8,  calcium 7.6.  LDH 144.  HIV antibody was negative.  Helicobacter pylori IGG  was elevated at 4.2.  Acute hepatitis panel also negative.  Ferritin 295. Free T4 0.94, TSH 1.71.  Lipase 19, amylase 50.  Urine microscopy negative  for red blood cell casts.   Dictated by MS [facility]      Coralie Carpen, M.D.  Electronically Signed  Gary Fleet, M.D.  Electronically Signed    FR/MEDQ  D:  04/26/2006  T:  04/26/2006  Job:  [id number]   cc:   Shirley Friar, MD  C. Ulyess Mort, M.D.

## 2011-03-17 NOTE — Consult Note (Signed)
NAMEAINE, STRYCHARZ NO.:  [id number]   MEDICAL RECORD NO.:  [id number]          PATIENT TYPE:  INP   LOCATION:  5031                         FACILITY:  [facility]   PHYSICIAN:  Shirley Friar, MDDATE OF BIRTH:  1960-11-09   DATE OF CONSULTATION:  04/23/2006  DATE OF DISCHARGE:                                   CONSULTATION   REQUESTING PHYSICIAN:  Dr. Lowella Bandy   INDICATIONS:  Abdominal pain, GI bleed.   HISTORY OF PRESENT ILLNESS:  [name] is a 50 year old black female  with history of alcohol abuse, history of seizures, and depression who comes  in secondary to a two-day history of epigastric abdominal pain described as  dull with nausea without any vomiting.  She also had dark red stool per  rectum yesterday x1 and then reports bright red blood this morning with  brown stools.  She said the blood was noted in the toilet water.  She is  unsure how much bleeding it was and yesterday when she had the bleeding she  says she just drank trying alcohol and went to sleep.  She also has a  history of cocaine abuse since she last used crack cocaine two months ago.  She denies any history of ulcers or prior rectal bleeding.  She said prior  to yesterday she was having some abdominal pain, but did not really pay  attention to it.  She denies any NSAIDs.   PAST MEDICAL HISTORY:  1.  Alcohol abuse.  2.  Cocaine abuse.  3.  History depression.  4.  History of seizures.   MEDICATIONS:  None.   ALLERGIES:  No known drug allergies.   SOCIAL HISTORY:  See above.   REVIEW OF SYSTEMS:  Negative except as stated above.   PHYSICAL EXAMINATION:  VITAL SIGNS:  Temperature 97.5, pulse 93, blood  pressure 134/94, O2 100% on room air.  GENERAL:  Alert, in no acute distress.  HEENT:  Nonicteric sclerae.  CHEST:  Clear to auscultation bilaterally.  CARDIOVASCULAR:  Regular rate and rhythm without murmurs.  ABDOMEN:  Epigastric tenderness, otherwise nontender,  soft, nondistended,  active bowel sounds, no masses palpated.  EXTREMITIES:  No edema.   LABORATORIES:  White blood count 6.6, hemoglobin 12.2, MCV 92, platelet  count 290.  INR 1.  BUN 13, creatinine 0.9, Tbili 1.6, ALP 54, AST 77, ALT  35.  GGT 106.  Urine drug screen positive for cocaine.   IMPRESSION:  A 50 year old black female presents with two days of epigastric  abdominal pain and rectal bleeding.  Her history is not classic for an upper  gastrointestinal bleed and her history of alcohol abuse is concerning for  pancreatitis with possible lower gastrointestinal bleed versus peptic ulcer  disease.  No rectal bleeding per admission team but patient was heme-  positive in the emergency room.  Differential includes upper  gastrointestinal source such as peptic ulcer disease versus portal  hypertensive gastropathy versus hemorrhagic gastritis versus arteriovenous  malformations versus colonic source such as hemorrhoids, diverticulosis, or  colorectal cancer.  I have a hard time getting a history  in terms of exactly  what type of bleeding she has been having.  She reports dark red blood on  one occasion and then bright red blood on another occasion with brown stool.  She is not passing any black tarry stools and denies having black tarry  stools.  She denies NSAID use.  Despite this, with her epigastric tenderness  and pain she does need to have upper endoscopy to evaluate for peptic ulcer  disease.  However, she also needs work-up for pancreatitis due to her  significant alcohol abuse.  Other concerns would be ischemic colitis with  her cocaine abuse as well as ischemic disease in the small intestine.  At  this time, would do supportive care and plan for upper endoscopy on April 24, 2006 to rule out for sources described above.  Would also check amylase,  lipase and if these are abnormal consider doing abdominal CT scan to look  for any evidence of mid gut inflammation or  pancreatitis.  If her upper  endoscopy is negative and pancreatitis work-up is negative will plan to do  colonoscopy on April 25, 2006 and prep with GoLYTELY.  I discussed risks,  benefits, and alternatives with patient regarding these procedures and she  agrees to proceed with them.  Also will discontinue her Pepcid and change to  Protonix 40 mg intravenous daily.      Shirley Friar, MD  Electronically Signed     VCS/MEDQ  D:  04/23/2006  T:  04/24/2006  Job:  [id number]

## 2011-03-17 NOTE — Discharge Summary (Signed)
NAMELOVINA, ZUVER NO.:  [id number]   MEDICAL RECORD NO.:  [id number]          PATIENT TYPE:  IPS   LOCATION:  0507                          FACILITY:  [facility]   PHYSICIAN:  Geoffery Lyons, M.D.      DATE OF BIRTH:  [birth year]   DATE OF ADMISSION:  12/05/2004  DATE OF DISCHARGE:  12/10/2004                                 DISCHARGE SUMMARY   CHIEF COMPLAINT AND PRESENT ILLNESS:  This was the first admission to [facility] for this 50 year old single African-American female  voluntarily admitted.  History of depression, having suicidal thoughts with  a plan to cut her wrists.  She had been drinking.  Last drink was on  Wednesday.  The patient had been doing some binge-drinking and smoking crack  cocaine.  Stressors are she is unemployed, having financial issues, feeling  depressed, sleeping very poorly, isolating.   PAST PSYCHIATRIC HISTORY:  First time at [facility].  Hospitalized  prior at [facility] about two years prior to this admission for  depression and overdose.  Also [facility] back in [facility].  No current  psychiatric treatment.   ALCOHOL/DRUG HISTORY:  Drinking beer and wine.  Last drink was on Sunday  prior to this admission.  Blackouts.  History of seizure activity.  Last  seizure was in June, when she stopped drinking.   MEDICAL HISTORY:  Noncontributory.   MEDICATIONS:  None.   PHYSICAL EXAMINATION:  Performed and failed to show any acute findings.   LABORATORY DATA:  CBC with hemoglobin 10.4, hematocrit 31.3.  Blood  chemistry with SGOT 17, SGPT 13, total bilirubin 0.9.  Drug screen positive  for cocaine.   MENTAL STATUS EXAM:  Alert female with little eye contact.  Speech was soft-  spoken.  At times, difficult to understand.  Mood was depressed.  Thought  process was logical, coherent and relevant but endorsing auditory  hallucinations.  Unable to describe at the time.  No evidence of delusions.  No  visual hallucinations.  Cognition was well-preserved.   ADMISSION DIAGNOSES:   AXIS I:  1.  Depressive disorder not otherwise specified.  2.  Alcohol dependence.  3.  Cocaine abuse.   AXIS II:  No diagnosis.   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   AXIS IV:  Moderate.   AXIS V:  Global Assessment of Functioning upon admission 35; highest Global  Assessment of Functioning in the last year 60.   HOSPITAL COURSE:  She was admitted and started in individual and group  psychotherapy.  She was detoxified with Librium.  She was given Protonix 40  mg per day, Seroquel 25 mg every six hours as needed, trazodone 50 mg at  night for sleep.  She was started on Cymbalta 30 mg per day and, later on,  she was started on lithium 300 mg twice a day.  She was able to settle down  and open up in individual and group counseling.  She endorsed that she had  an argument with her mother.  Apparently, the mother has  never been  supportive of her.  She apparently has lost a lot of jobs due to her temper.  She was planning to stay away from old friends and old habits.  As of  recently, she has been endorsing difficulty with depression, staying in the  house, endorsed she has not been able to get herself together.  Endorsed  episodes of irritability, anger, mood swings not triggered with some racing  thoughts.  She was able to start addressing her issues, start working on her  self-esteem, working on [employer], dispensing.  She tolerated the lithium well.  Felt that she was starting to feel better.  Her mood improved.  Her affect  was brighter.  She was going to stay with her sister.  Felt better.  Committed to abstinence and doing better.  No suicidal or homicidal  ideation.  We detoxed.   DISCHARGE DIAGNOSES:   AXIS I:  1.  Mood disorder not otherwise specified.  2.  Alcohol dependence.  3.  Cocaine abuse.   AXIS II:  No diagnosis.   AXIS III:  Withdrawal seizures.   AXIS IV:   Moderate.   AXIS V:  Global Assessment of Functioning upon discharge 55.   DISCHARGE MEDICATIONS:  1.  Cymbalta 30 mg per day.  2.  Lithium 300 mg twice a day.  3.  Trazodone 50 mg at night for sleep.   FOLLOW UP:  [facility] and [facility] of the  [place].      IL/MEDQ  D:  01/10/2005  T:  01/10/2005  Job:  [id number]

## 2011-03-17 NOTE — Op Note (Signed)
NAMEELLIEMAE, BRAMAN NO.:  [id number]   MEDICAL RECORD NO.:  [id number]          PATIENT TYPE:  INP   LOCATION:  5031                         FACILITY:  [facility]   PHYSICIAN:  Shirley Friar, MDDATE OF BIRTH:  03/04/1961   DATE OF PROCEDURE:  04/25/2006  DATE OF DISCHARGE:                                 OPERATIVE REPORT   INDICATION:  Rectal bleeding.   MEDICATIONS:  Fentanyl 125 mcg IV, Versed 12 mg IV, Phenergan 12.5 mg IV.   FINDINGS:  Rectal exam was normal.  A pediatric adjustable colonoscope was  inserted into a fair prepped colon and advanced to the cecum where the  ileocecal valve and appendiceal orifice were identified.  The terminal ileum  was intubated and in the terminal ileum was scattered shallow ulcerations  without any active bleeding.  These areas were biopsied x2.  Careful  withdrawal of the colonoscope revealed no mucosal abnormalities and no  bleeding noted.  Retroflexion revealed small internal hemorrhoids.   ASSESSMENT:  1.  Shallow ulcerations in terminal ileum, post biopsy, question medicines      such as nonsteroidal anti-inflammatory drugs versus inflammation from      inflammatory bowel disease.  2.  Internal hemorrhoids.   PLAN:  1.  Follow-up on path.  2.  High fiber diet.  3.  Avoid NSAIDs.      Shirley Friar, MD  Electronically Signed     VCS/MEDQ  D:  04/25/2006  T:  04/25/2006  Job:  [id number]
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
## 2011-03-17 NOTE — Op Note (Signed)
NAMESKILYNN, DURNEY NO.:  [id number]   MEDICAL RECORD NO.:  [id number]          PATIENT TYPE:  INP   LOCATION:  5031                         FACILITY:  [facility]   PHYSICIAN:  Shirley Friar, MDDATE OF BIRTH:  1960/11/13   DATE OF PROCEDURE:  04/24/2006  DATE OF DISCHARGE:                                 OPERATIVE REPORT   INDICATION:  Epigastric pain, GI bleed.   MEDICATIONS:  Fentanyl 100 mcg IV, Versed 10 mg IV, Phenergan 12.5 mg IV.   FINDINGS:  Endoscope was inserted into the oropharynx and esophagus was  intubated which was normal in its entirety.  Endoscope was advanced down  into the stomach which was normal in its entirety without any mucosal  abnormalities or blood noted.  Endoscope was then advanced down into the  duodenal bulb which revealed scattered punctate erythematous spots  consistent with mild duodenitis.  No ulcers or bleeding was seen.  The  endoscope was advanced to the second portion of the duodenum which was  normal in appearance.  Endoscope was then withdrawn to confirm the above  findings.   ASSESSMENT:  1.  Mild duodenitis, otherwise normal esophagogastroduodenoscopy.  2.  No bleeding source found.   PLAN:  Colonoscopy on April 25, 2006.      Shirley Friar, MD  Electronically Signed     VCS/MEDQ  D:  04/24/2006  T:  04/24/2006  Job:  [id number] [id number]
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
## 2011-03-17 NOTE — Discharge Summary (Signed)
[name], [name] NO.:  [id number]   MEDICAL RECORD NO.:  [id number]          PATIENT TYPE:  IPS   LOCATION:  0306                          FACILITY:  [facility]   PHYSICIAN:  Anselm Jungling, MD  DATE OF BIRTH:  05-Aug-1961   DATE OF ADMISSION:  08/30/2006  DATE OF DISCHARGE:  09/03/2006                               DISCHARGE SUMMARY   IDENTIFYING DATA/REASON FOR ADMISSION:  The patient is a 50 year old  single African American female admitted in the aftermath of an overdose  suicide attempt.  She also reported auditory hallucinations.  She had  been in conflict with her son and boyfriend.  At the time of admission,  her history was not that clear.  She had apparently been prescribed some  antidepressant but it was not clear whether she had been taking it.  She  also had a history of substance abuse.  Please refer to the admission  note for further details pertaining to the symptoms, circumstances and  history that led to her hospitalization.  She was given an initial Axis  I diagnosis of depressive disorder NOS, and history of substance abuse.   MEDICAL AND LABORATORY:  The patient was medically and physically  assessed by the psychiatric nurse practitioner.  There were no  significant or acute medical issues.   HOSPITAL COURSE:  The patient was admitted to the adult inpatient  psychiatric service.  She presented as a well-nourished, well-developed,  African American female who initially was quite withdrawn and made  little effort to interact with the undersigned.  On the following day  she was more available in our meeting, reporting that her issues were  anxiety and stress, surrounding her home situation.  She reported that  she had been on an antidepressant medication, but did not know which  one.  She agreed to a trial of Cymbalta 30 mg daily.  She was continued  on detoxification protocol based upon Librium.  She appeared non  tremulous.  She was  attending groups and activities.   Over the next few days, she continued to participate well on the  program, and reported feeling better day-by-day.  On the fifth hospital  day, there was a family meeting involving the patient, her husband, and  two of her friends.  Relapse prevention was the main issue and the  patient verbalized a plan not to let circumstances at home or elsewhere  control her ability to remain clean and sober.  She talked about a plan  to attend NA meetings and to get an NA sponsor.  She also agreed to see  a therapist at [facility].   The following day the patient appeared to be quite stable, her mood was  improved, and she was absent suicidal ideation.  She agreed to the  aftercare plan formulated for her.   AFTERCARE:  The patient was to follow-up at [facility] of the  [place] with an appointment on September 07, 2006, and at the [facility] with an appointment on September 06, 2006.   DISCHARGE MEDICATIONS:  Cymbalta 30 mg daily.  DISCHARGE DIAGNOSES:  AXIS I:  Depressive disorder not otherwise  specified and history of substance abuse.  AXIS II:  Deferred.  AXIS III:  No acute or chronic illnesses.  AXIS IV:  Stressors severe.  AXIS V:  GAF on discharge 65.      Anselm Jungling, MD  Electronically Signed     SPB/MEDQ  D:  10/01/2006  T:  10/02/2006  Job:  [phone]

## 2011-03-17 NOTE — Discharge Summary (Signed)
NAMEGIAVANA, ROOKE NO.:  [id number]   MEDICAL RECORD NO.:  [id number]          PATIENT TYPE:  IPS   LOCATION:  0306                          FACILITY:  BH   PHYSICIAN:  Anselm Jungling, MD  DATE OF BIRTH:  [birth year]   DATE OF ADMISSION:  08/30/2006  DATE OF DISCHARGE:  09/03/2006                                 DISCHARGE SUMMARY   IDENTIFYING DATA/REASON FOR ADMISSION:  The patient is a 50 year old single  African American female admitted in the aftermath of an overdose/suicide  attempt.  She also had reported auditory hallucinations.  She had been  having conflict with her son and boyfriend at home.  Please refer to the  admission note for further details pertaining to the symptoms, circumstances  and history that led to her hospitalization.   INITIAL DIAGNOSTIC IMPRESSION:  She was given an initial AXIS I diagnosis of  depressive disorder not otherwise specified.   MEDICAL/LABORATORY:  The patient came to [place] in essentially good health  without any active or chronic medical problems.  She was medically and  physically assessed by the psychiatric nurse practitioner.  There were no  acute medical issues.   HOSPITAL COURSE:  The patient was admitted to the adult inpatient  psychiatric service.  She presented as a slender, but well-nourished and  normally developed African-American female who was initially quite tired and  spent most of the first day in bed.  However, after this, she was up,  dressed and attended various therapeutic groups and activities.  She was  able to focus well on her presenting stressors, involving conflicts with her  family members at home, and the anxiety and stress that she described that  inducing.  The patient had reported that she had previously been treated  with an antidepressant medication but could not recall the name of it.  Cymbalta 30 mg daily was begun as a new antidepressant trial for her.   The patient was on a  Librium detoxification protocol due to excessive  alcohol usage.  She had minimal withdrawal symptoms during her inpatient  stay and her detoxification proceeded uneventfully.   By the fourth hospital day, the patient reported that she was feeling  better.  On that day, there was a family session involving the patient, her  boyfriend, and two other friends.  Relapse prevention was the main issue  discussed, and the patient spoke about a plan not to allow drugs or alcohol  in her house or around her, or to interact with people who were such  involved.  The patient's family supported this choice.  The patient talked  about her plan to attend Narcotics Anonymous meetings and get a sponsor.  She also indicated that she wanted to see her therapist and the possibility  of arranging this through [facility] was discussed.   On the following day, the patient remained brighter, more optimistic, and  appeared to be absent any further alcohol withdrawal symptoms.  She was  tolerating Cymbalta.  She agreed to the aftercare plan developed.   AFTERCARE:  The patient was to follow up  at [facility] of the [place]  with an appointment on September 07, 2006, and for medication management, with  Dr. Lang Snow at the [facility] on September 06, 2006.   DISCHARGE MEDICATIONS:  Cymbalta 30 mg daily.   DISCHARGE DIAGNOSES:  AXIS I:  Depressive disorder not otherwise specified.  Alcohol dependence, early remission.  AXIS II:  Deferred.  AXIS III:  No acute or chronic illnesses.  AXIS IV:  Stressors:  Severe.  AXIS V:  GAF on discharge 65.      Anselm Jungling, MD  Electronically Signed     SPB/MEDQ  D:  09/05/2006  T:  09/05/2006  Job:  [phone]

## 2011-05-04 ENCOUNTER — Emergency Department (HOSPITAL_COMMUNITY)
Admission: EM | Admit: 2011-05-04 | Discharge: 2011-05-04 | Disposition: A | Discharge status: Home / Self Care | Payer: Self-pay | Attending: Emergency Medicine | Admitting: Emergency Medicine

## 2011-05-04 DIAGNOSIS — W268XXA Contact with other sharp object(s), not elsewhere classified, initial encounter: Secondary | ICD-10-CM | POA: Insufficient documentation

## 2011-05-04 DIAGNOSIS — Y92009 Unspecified place in unspecified non-institutional (private) residence as the place of occurrence of the external cause: Secondary | ICD-10-CM | POA: Insufficient documentation

## 2011-05-04 DIAGNOSIS — S91309A Unspecified open wound, unspecified foot, initial encounter: Secondary | ICD-10-CM | POA: Insufficient documentation

## 2011-05-05 ENCOUNTER — Ambulatory Visit (INDEPENDENT_AMBULATORY_CARE_PROVIDER_SITE_OTHER): Payer: Self-pay | Admitting: Internal Medicine

## 2011-05-05 VITALS — BP 131/79 | HR 83 | Temp 98.0°F | Ht 64.0 in | Wt 124.8 lb

## 2011-05-05 DIAGNOSIS — S8990XA Unspecified injury of unspecified lower leg, initial encounter: Secondary | ICD-10-CM

## 2011-05-05 DIAGNOSIS — I2699 Other pulmonary embolism without acute cor pulmonale: Secondary | ICD-10-CM

## 2011-05-05 DIAGNOSIS — I10 Essential (primary) hypertension: Secondary | ICD-10-CM | POA: Insufficient documentation

## 2011-05-05 DIAGNOSIS — G479 Sleep disorder, unspecified: Secondary | ICD-10-CM | POA: Insufficient documentation

## 2011-05-05 DIAGNOSIS — S99921A Unspecified injury of right foot, initial encounter: Secondary | ICD-10-CM | POA: Insufficient documentation

## 2011-05-05 MED ORDER — ZOLPIDEM TARTRATE 5 MG PO TABS: 5.0000 mg | ORAL_TABLET | Freq: Every evening | ORAL | Status: DC | PRN

## 2011-05-05 MED ORDER — OXYCODONE-ACETAMINOPHEN 5-325 MG PO TABS: 1.0000 | ORAL_TABLET | Freq: Three times a day (TID) | ORAL | Status: DC | PRN

## 2011-05-05 MED ORDER — ASPIRIN 81 MG PO TABS: 81.0000 mg | ORAL_TABLET | Freq: Every day | ORAL | Status: AC

## 2011-05-05 NOTE — Assessment & Plan Note (Signed)
She injured her right foot with a piece of glass while she was cleaning her house yesterday. She went to the ER and got sutures. She was complaining of some associated pain. On exam the wound site looked healthy. She was given 10 Percocet tablets for pain.  She was advised to followup with ER in 7-10 days for suture removal.

## 2011-05-05 NOTE — Progress Notes (Signed)
  Subjective:    [name] Born, female    DOB: 01-16-1961, 50 y.o.   MRN: [id number]  HPI: 50 year old woman with past medical history significant for PE, hypertension comes to the clinic for a followup visit.  She injured her right foot with a piece of glass while she was cleaning her house yesterday. She went to the ER/urgent care and got 2-3 stitches. She was complaining of pain in her right foot and was requesting something for pain. She was also complaining of some difficulty sleeping and gets irritable with family members at times due to inadequate sleep.  Otherwise denies any fever, chills, nausea, vomiting, diarrhea, chest pain, palpitations. Also denies any depressed mood.    Review of Systems  Constitutional: Negative for fever, chills, diaphoresis, activity change and fatigue.  HENT: Negative for nosebleeds, congestion, rhinorrhea, sneezing and postnasal drip.   Respiratory: Negative for cough, choking, chest tightness and wheezing.   Cardiovascular: Negative for chest pain, palpitations and leg swelling.  Gastrointestinal: Negative for abdominal distention.  Genitourinary: Negative for dysuria, flank pain, menstrual problem and dyspareunia.  Musculoskeletal: Negative for back pain and arthralgias.       Positive for right foot pain  Neurological: Negative for dizziness, speech difficulty, numbness and headaches.  Hematological: Negative for adenopathy.       Objective:   Physical Exam  Constitutional: She is oriented to person, place, and time. She appears well-developed and well-nourished. No distress.  HENT:  Head: Normocephalic and atraumatic.  Right Ear: External ear normal.  Left Ear: External ear normal.  Mouth/Throat: Oropharynx is clear and moist.  Eyes: Conjunctivae and EOM are normal. Pupils are equal, round, and reactive to light.  Neck: Normal range of motion. Neck supple. No JVD present. No tracheal deviation present. No thyromegaly present.    Cardiovascular: Normal rate, regular rhythm and intact distal pulses.  Exam reveals no gallop and no friction rub.   No murmur heard. Pulmonary/Chest: Effort normal and breath sounds normal. No stridor.  Abdominal: Bowel sounds are normal. She exhibits no distension. There is no tenderness. There is no rebound and no guarding.  Musculoskeletal: Normal range of motion. She exhibits no edema and no tenderness.       On dorsum aspect of Right foot there is about 4-5cm linear laceration with 3 sutures in place.  Lymphadenopathy:    She has no cervical adenopathy.  Neurological: She is alert and oriented to person, place, and time. She has normal reflexes.  Skin: Skin is warm. She is not diaphoretic.          Assessment & Plan:

## 2011-05-05 NOTE — Patient Instructions (Signed)
Please schedule a follow up appointment in 3 months or earlier if needed. Please take your medicines as prescribed. 

## 2011-05-05 NOTE — Assessment & Plan Note (Signed)
She was complaining of some sleep difficulties and irritable mood resultant from that. She clearly denied any depressed mood or suicidal thoughts. She was counseled on sleep hygiene and was given a prescription for Zolpidem.

## 2011-05-05 NOTE — Assessment & Plan Note (Signed)
She completed her course of Coumadin for  the episode of PE in July 2010. She was taking aspirin 325 mg daily which was switched to aspirin 81 mg with  today's visit.

## 2011-05-05 NOTE — Assessment & Plan Note (Addendum)
Her blood pressure is well controlled. Will continue the current regimen. Will check her BMET with next visit.   Lab Results  Component Value Date   NA 141 05/19/2009   K 4.2 05/19/2009   CL 107 05/19/2009   CO2 26 05/19/2009   BUN 2* 05/19/2009   CREATININE 0.65 05/19/2009    BP Readings from Last 3 Encounters:  05/05/11 131/79  01/02/11 140/89  12/26/10 130/88

## 2011-05-06 ENCOUNTER — Encounter: Payer: Self-pay | Admitting: Internal Medicine

## 2011-05-17 ENCOUNTER — Emergency Department (HOSPITAL_COMMUNITY)
Admission: EM | Admit: 2011-05-17 | Discharge: 2011-05-18 | Disposition: A | Discharge status: Home / Self Care | Payer: Self-pay | Attending: Emergency Medicine | Admitting: Emergency Medicine

## 2011-05-17 DIAGNOSIS — Z09 Encounter for follow-up examination after completed treatment for conditions other than malignant neoplasm: Secondary | ICD-10-CM | POA: Insufficient documentation

## 2011-05-17 DIAGNOSIS — Z4802 Encounter for removal of sutures: Secondary | ICD-10-CM | POA: Insufficient documentation

## 2011-07-25 LAB — DIFFERENTIAL
Basophils Absolute: 0.1
Eosinophils Absolute: 0.1
Eosinophils Relative: 1
Lymphocytes Relative: 40
Lymphs Abs: 2.8
Neutro Abs: 3.5
Neutrophils Relative %: 48

## 2011-07-25 LAB — BASIC METABOLIC PANEL
BUN: 7
Calcium: 9.9
GFR calc non Af Amer: >60
Potassium: 3.4 — ABNORMAL LOW
Sodium: 145

## 2011-07-25 LAB — CBC
HCT: 35.4 — ABNORMAL LOW
Hemoglobin: 12.2
Platelets: 318
RDW: 17 — ABNORMAL HIGH
WBC: 7.1

## 2011-07-25 LAB — ETHANOL: Alcohol, Ethyl (B): 271 — ABNORMAL HIGH

## 2011-09-12 ENCOUNTER — Ambulatory Visit (INDEPENDENT_AMBULATORY_CARE_PROVIDER_SITE_OTHER): Payer: Self-pay | Admitting: *Deleted

## 2011-09-12 DIAGNOSIS — D518 Other vitamin B12 deficiency anemias: Secondary | ICD-10-CM

## 2011-09-12 DIAGNOSIS — Z23 Encounter for immunization: Secondary | ICD-10-CM

## 2011-09-12 MED ORDER — CYANOCOBALAMIN 1000 MCG/ML IJ SOLN
1000.0000 ug | Freq: Once | INTRAMUSCULAR | Status: AC
  Administered 2011-09-12: 1000 ug via INTRAMUSCULAR

## 2011-10-05 ENCOUNTER — Ambulatory Visit (INDEPENDENT_AMBULATORY_CARE_PROVIDER_SITE_OTHER): Payer: Self-pay | Admitting: Internal Medicine

## 2011-10-05 ENCOUNTER — Encounter: Payer: Self-pay | Admitting: Internal Medicine

## 2011-10-05 DIAGNOSIS — S8990XA Unspecified injury of unspecified lower leg, initial encounter: Secondary | ICD-10-CM

## 2011-10-05 DIAGNOSIS — G4700 Insomnia, unspecified: Secondary | ICD-10-CM

## 2011-10-05 DIAGNOSIS — M25569 Pain in unspecified knee: Secondary | ICD-10-CM

## 2011-10-05 DIAGNOSIS — X58XXXA Exposure to other specified factors, initial encounter: Secondary | ICD-10-CM

## 2011-10-05 DIAGNOSIS — S99921A Unspecified injury of right foot, initial encounter: Secondary | ICD-10-CM

## 2011-10-05 DIAGNOSIS — I10 Essential (primary) hypertension: Secondary | ICD-10-CM

## 2011-10-05 DIAGNOSIS — G479 Sleep disorder, unspecified: Secondary | ICD-10-CM

## 2011-10-05 DIAGNOSIS — D649 Anemia, unspecified: Secondary | ICD-10-CM

## 2011-10-05 DIAGNOSIS — Z139 Encounter for screening, unspecified: Secondary | ICD-10-CM

## 2011-10-05 LAB — COMPREHENSIVE METABOLIC PANEL
AST: 30 U/L (ref 0–37)
Albumin: 4.8 g/dL (ref 3.5–5.2)
Alkaline Phosphatase: 73 U/L (ref 39–117)
BUN: 18 mg/dL (ref 6–23)
Creat: 0.64 mg/dL (ref 0.50–1.10)
Glucose, Bld: 116 mg/dL — ABNORMAL HIGH (ref 70–99)
Potassium: 3.8 mEq/L (ref 3.5–5.3)
Total Bilirubin: 0.5 mg/dL (ref 0.3–1.2)

## 2011-10-05 LAB — FERRITIN: Ferritin: 630 ng/mL — ABNORMAL HIGH (ref 10–291)

## 2011-10-05 LAB — CBC
HCT: 38.9 % (ref 36.0–46.0)
Hemoglobin: 12.3 g/dL (ref 12.0–15.0)
MCH: 29.3 pg (ref 26.0–34.0)
MCHC: 31.6 g/dL (ref 30.0–36.0)
MCV: 92.6 fL (ref 78.0–100.0)
RBC: 4.2 MIL/uL (ref 3.87–5.11)

## 2011-10-05 LAB — LIPID PANEL
Cholesterol: 257 mg/dL — ABNORMAL HIGH (ref 0–200)
HDL: 97 mg/dL (ref >39)
Total CHOL/HDL Ratio: 2.6 Ratio
Triglycerides: 165 mg/dL — ABNORMAL HIGH (ref <150)
VLDL: 33 mg/dL (ref 0–40)

## 2011-10-05 LAB — IRON AND TIBC
%SAT: 61 % — ABNORMAL HIGH (ref 20–55)
TIBC: 310 ug/dL (ref 250–470)

## 2011-10-05 MED ORDER — ZOLPIDEM TARTRATE 5 MG PO TABS: 5.0000 mg | ORAL_TABLET | Freq: Every evening | ORAL | Status: DC | PRN

## 2011-10-05 MED ORDER — DICLOFENAC SODIUM 75 MG PO TBEC: 75.0000 mg | DELAYED_RELEASE_TABLET | Freq: Two times a day (BID) | ORAL | Status: DC

## 2011-10-05 NOTE — Assessment & Plan Note (Signed)
Well controlled, continue current dose of clonidine

## 2011-10-05 NOTE — Assessment & Plan Note (Signed)
Stable Ambien prescription given
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
## 2011-10-05 NOTE — Patient Instructions (Signed)
Knee Pain The knee is the complex joint between your thigh and your lower leg. It is made up of bones, tendons, ligaments, and cartilage. The bones that make up the knee are:  The femur in the thigh.   The tibia and fibula in the lower leg.   The patella or kneecap riding in the groove on the lower femur.  CAUSES  Knee pain is a common complaint with many causes. A few of these causes are:  Injury, such as:   A ruptured ligament or tendon injury.   Torn cartilage.   Medical conditions, such as:   Gout   Arthritis   Infections   Overuse, over training or overdoing a physical activity.  Knee pain can be minor or severe. Knee pain can accompany debilitating injury. Minor knee problems often respond well to self-care measures or get well on their own. More serious injuries may need medical intervention or even surgery. SYMPTOMS The knee is complex. Symptoms of knee problems can vary widely. Some of the problems are:  Pain with movement and weight bearing.   Swelling and tenderness.   Buckling of the knee.   Inability to straighten or extend your knee.   Your knee locks and you cannot straighten it.   Warmth and redness with pain and fever.   Deformity or dislocation of the kneecap.  DIAGNOSIS  Determining what is wrong may be very straight forward such as when there is an injury. It can also be challenging because of the complexity of the knee. Tests to make a diagnosis may include:  Your caregiver taking a history and doing a physical exam.   Routine X-rays can be used to rule out other problems. X-rays will not reveal a cartilage tear. Some injuries of the knee can be diagnosed by:   Arthroscopy a surgical technique by which a small video camera is inserted through tiny incisions on the sides of the knee. This procedure is used to examine and repair internal knee joint problems. Tiny instruments can be used during arthroscopy to repair the torn knee cartilage  (meniscus).   Arthrography is a radiology technique. A contrast liquid is directly injected into the knee joint. Internal structures of the knee joint then become visible on X-ray film.   An MRI scan is a non x-ray radiology procedure in which magnetic fields and a computer produce two- or three-dimensional images of the inside of the knee. Cartilage tears are often visible using an MRI scanner. MRI scans have largely replaced arthrography in diagnosing cartilage tears of the knee.   Blood work.   Examination of the fluid that helps to lubricate the knee joint (synovial fluid). This is done by taking a sample out using a needle and a syringe.  TREATMENT The treatment of knee problems depends on the cause. Some of these treatments are:  Depending on the injury, proper casting, splinting, surgery or physical therapy care will be needed.   Give yourself adequate recovery time. Do not overuse your joints. If you begin to get sore during workout routines, back off. Slow down or do fewer repetitions.   For repetitive activities such as cycling or running, maintain your strength and nutrition.   Alternate muscle groups. For example if you are a weight lifter, work the upper body on one day and the lower body the next.   Either tight or weak muscles do not give the proper support for your knee. Tight or weak muscles do not absorb the stress placed   on the knee joint. Keep the muscles surrounding the knee strong.   Take care of mechanical problems.   If you have flat feet, orthotics or special shoes may help. See your caregiver if you need help.   Arch supports, sometimes with wedges on the inner or outer aspect of the heel, can help. These can shift pressure away from the side of the knee most bothered by osteoarthritis.   A brace called an "unloader" brace also may be used to help ease the pressure on the most arthritic side of the knee.   If your caregiver has prescribed crutches, braces,  wraps or ice, use as directed. The acronym for this is PRICE. This means protection, rest, ice, compression and elevation.   Nonsteroidal anti-inflammatory drugs (NSAID's), can help relieve pain. But if taken immediately after an injury, they may actually increase swelling. Take NSAID's with food in your stomach. Stop them if you develop stomach problems. Do not take these if you have a history of ulcers, stomach pain or bleeding from the bowel. Do not take without your caregiver's approval if you have problems with fluid retention, heart failure, or kidney problems.   For ongoing knee problems, physical therapy may be helpful.   Glucosamine and chondroitin are over-the-counter dietary supplements. Both may help relieve the pain of osteoarthritis in the knee. These medicines are different from the usual anti-inflammatory drugs. Glucosamine may decrease the rate of cartilage destruction.   Injections of a corticosteroid drug into your knee joint may help reduce the symptoms of an arthritis flare-up. They may provide pain relief that lasts a few months. You may have to wait a few months between injections. The injections do have a small increased risk of infection, water retention and elevated blood sugar levels.   Hyaluronic acid injected into damaged joints may ease pain and provide lubrication. These injections may work by reducing inflammation. A series of shots may give relief for as long as 6 months.   Topical painkillers. Applying certain ointments to your skin may help relieve the pain and stiffness of osteoarthritis. Ask your pharmacist for suggestions. Many over the-counter products are approved for temporary relief of arthritis pain.   In some countries, doctors often prescribe topical NSAID's for relief of chronic conditions such as arthritis and tendinitis. A review of treatment with NSAID creams found that they worked as well as oral medications but without the serious side effects.    PREVENTION  Maintain a healthy weight. Extra pounds put more strain on your joints.   Get strong, stay limber. Weak muscles are a common cause of knee injuries. Stretching is important. Include flexibility exercises in your workouts.   Be smart about exercise. If you have osteoarthritis, chronic knee pain or recurring injuries, you may need to change the way you exercise. This does not mean you have to stop being active. If your knees ache after jogging or playing basketball, consider switching to swimming, water aerobics or other low-impact activities, at least for a few days a week. Sometimes limiting high-impact activities will provide relief.   Make sure your shoes fit well. Choose footwear that is right for your sport.   Protect your knees. Use the proper gear for knee-sensitive activities. Use kneepads when playing volleyball or laying carpet. Buckle your seat belt every time you drive. Most shattered kneecaps occur in car accidents.   Rest when you are tired.  SEEK MEDICAL CARE IF:  You have knee pain that is continual and does not   seem to be getting better.  SEEK IMMEDIATE MEDICAL CARE IF:  Your knee joint feels hot to the touch and you have a high fever. MAKE SURE YOU:   Understand these instructions.   Will watch your condition.   Will get help right away if you are not doing well or get worse.  Document Released: 08/13/2007 Document Revised: 06/28/2011 Document Reviewed: 08/13/2007 [employer] Patient Information 2012 [employer]. 

## 2011-10-05 NOTE — Assessment & Plan Note (Addendum)
Patient had an injury last July where she received several stitches to her knee, today on exam her knees stable, however given persistent pain I will prescribe an anti-inflammatory medication, and reevaluate her knee pain at next followup, no imaging is indicated at this time however if her knee pain persists an MRI would be a reasonable option. Note that given history of cocaine abuse and other substances she would not be a candidate for opiate therapy if not clearly indicated.

## 2011-11-27 ENCOUNTER — Encounter: Payer: Self-pay | Admitting: Internal Medicine

## 2011-11-27 ENCOUNTER — Ambulatory Visit (INDEPENDENT_AMBULATORY_CARE_PROVIDER_SITE_OTHER): Payer: Self-pay | Admitting: Internal Medicine

## 2011-11-27 DIAGNOSIS — I10 Essential (primary) hypertension: Secondary | ICD-10-CM

## 2011-11-27 DIAGNOSIS — D649 Anemia, unspecified: Secondary | ICD-10-CM

## 2011-11-27 DIAGNOSIS — D518 Other vitamin B12 deficiency anemias: Secondary | ICD-10-CM

## 2011-11-27 DIAGNOSIS — Z0000 Encounter for general adult medical examination without abnormal findings: Secondary | ICD-10-CM

## 2011-11-27 MED ORDER — CYANOCOBALAMIN 1000 MCG/ML IJ SOLN
1000.0000 ug | Freq: Once | INTRAMUSCULAR | Status: AC
  Administered 2011-11-27: 1000 ug via INTRAMUSCULAR

## 2011-11-27 NOTE — Patient Instructions (Signed)
Please schedule a follow up appointment in 3 months. Please take your medicines as prescribed. I will call with the labs results if something is abnormal.

## 2011-11-27 NOTE — Progress Notes (Signed)
  Subjective:    [name] Born, female    DOB: 1960/12/14, 51 y.o.   MRN: [id number]  HPI: 51 y/o woman with past medical history significant for hypertension, vitamin B12 deficiency comes to the clinic for a followup visit.  She denies any complaints today- including the knee pain that was present at previous visit. She would like to discuss the results of the labs done during the last visit.  She states that she gets yearly mammograms given a positive family history of breast cancer at [facility] but we do not have any records in our system      Review of Systems  Constitutional: Negative for chills, diaphoresis and fatigue.  HENT: Negative for nosebleeds, congestion and rhinorrhea.   Eyes: Negative for photophobia and visual disturbance.  Respiratory: Negative for apnea, cough, choking and chest tightness.   Cardiovascular: Negative for chest pain, palpitations and leg swelling.  Gastrointestinal: Negative for abdominal distention.  Genitourinary: Negative for dysuria, vaginal bleeding, vaginal discharge, vaginal pain, pelvic pain and dyspareunia.  Musculoskeletal: Negative for arthralgias.  Neurological: Negative for dizziness, facial asymmetry, light-headedness, numbness and headaches.  Hematological: Negative for adenopathy.  Psychiatric/Behavioral: Negative for agitation.       Objective:   Physical Exam  Constitutional: She is oriented to person, place, and time. She appears well-developed and well-nourished. No distress.  HENT:  Head: Normocephalic and atraumatic.  Mouth/Throat: No oropharyngeal exudate.  Eyes: Conjunctivae and EOM are normal. Pupils are equal, round, and reactive to light. Right eye exhibits no discharge. Left eye exhibits no discharge. No scleral icterus.  Neck: Normal range of motion. Neck supple. No JVD present. No tracheal deviation present. No thyromegaly present.  Cardiovascular: Normal rate, regular rhythm, normal heart sounds  and intact distal pulses.  Exam reveals no gallop and no friction rub.   No murmur heard. Pulmonary/Chest: Effort normal and breath sounds normal. No stridor. No respiratory distress. She has no wheezes. She has no rales. She exhibits no tenderness.  Abdominal: Soft. Bowel sounds are normal. She exhibits no distension and no mass. There is no tenderness. There is no rebound and no guarding.  Genitourinary: Vagina normal.       Whitish vaginal discharge present, no abnormal vaginal swelling noticed.  Musculoskeletal: Normal range of motion. She exhibits no edema and no tenderness.  Lymphadenopathy:    She has no cervical adenopathy.  Neurological: She is alert and oriented to person, place, and time. She has normal reflexes. She displays normal reflexes. No cranial nerve deficit. She exhibits normal muscle tone. Coordination normal.  Skin: Skin is warm. She is not diaphoretic.          Assessment & Plan:

## 2011-11-28 LAB — WET PREP BY MOLECULAR PROBE
Candida species: NEGATIVE
Gardnerella vaginalis: POSITIVE — AB

## 2011-11-28 LAB — GC/CHLAMYDIA PROBE AMP, GENITAL: Chlamydia, DNA Probe: NEGATIVE

## 2011-11-28 MED ORDER — METRONIDAZOLE 500 MG PO TABS: 500.0000 mg | ORAL_TABLET | Freq: Two times a day (BID) | ORAL | Status: AC

## 2011-12-01 NOTE — Assessment & Plan Note (Signed)
-   Check B-12 levels today. - Administer monthly shot. - With  her Hb- 12.3 ( baseline around 10.5) with MCV- 92.6, iron - 189 and ferritin- 630, her last lab results were inconsistent with any diagnosis. I discussed her case with Dr. Aundria Rud and we thought that if her Hb and MCV were normal , then we should not do any further testing with iron studies. Therefore, would not repeat any of those labs for now.

## 2011-12-01 NOTE — Assessment & Plan Note (Signed)
Lab Results  Component Value Date   NA 142 10/05/2011   K 3.8 10/05/2011   CL 105 10/05/2011   CO2 25 10/05/2011   BUN 18 10/05/2011   CREATININE 0.64 10/05/2011   CREATININE 0.65 05/19/2009    BP Readings from Last 3 Encounters:  11/27/11 127/86  10/05/11 136/83  05/05/11 131/79    Assessment: Hypertension control:  controlled  Progress toward goals:  at goal Barriers to meeting goals:  no barriers identified  Plan: Hypertension treatment:  continue current medications

## 2011-12-01 NOTE — Assessment & Plan Note (Addendum)
Her last Pap smear was in 2010- repeated the Pap smear today. On my exam she was noticed to have whitish vaginal discharge and asked her if she needs to be screened for  sexually transmitted diseases -patient agreed to that. -Will do a wet prep, check her for GC and Chlamydia. -Will try to get her reports on mammogram.  Her last lipid panel results were discussed- counseled her on lifestyle changes based on the results. Repeat lipids in 6 months.   Results of wet prep- patient was tested positive for bacterial vaginosis and trichomoniasis. When I called her for treatment instructions for herself and and her partner she stated that she doesn't have any current partner partner. I would just treat the patient with metronidazole for 7 days. She was also given instructions on avoidance of alcohol with the medication.

## 2012-01-15 ENCOUNTER — Encounter: Payer: Self-pay | Admitting: Internal Medicine

## 2012-02-28 ENCOUNTER — Emergency Department (HOSPITAL_COMMUNITY): Payer: Self-pay

## 2012-02-28 ENCOUNTER — Encounter (HOSPITAL_COMMUNITY): Payer: Self-pay | Admitting: *Deleted

## 2012-02-28 ENCOUNTER — Other Ambulatory Visit: Payer: Self-pay

## 2012-02-28 ENCOUNTER — Emergency Department (HOSPITAL_COMMUNITY)
Admission: EM | Admit: 2012-02-28 | Discharge: 2012-02-28 | Disposition: A | Discharge status: Home / Self Care | Payer: Self-pay | Attending: Emergency Medicine | Admitting: Emergency Medicine

## 2012-02-28 DIAGNOSIS — R071 Chest pain on breathing: Secondary | ICD-10-CM | POA: Insufficient documentation

## 2012-02-28 DIAGNOSIS — F341 Dysthymic disorder: Secondary | ICD-10-CM | POA: Insufficient documentation

## 2012-02-28 DIAGNOSIS — Z79899 Other long term (current) drug therapy: Secondary | ICD-10-CM | POA: Insufficient documentation

## 2012-02-28 DIAGNOSIS — R0789 Other chest pain: Secondary | ICD-10-CM

## 2012-02-28 DIAGNOSIS — R0602 Shortness of breath: Secondary | ICD-10-CM | POA: Insufficient documentation

## 2012-02-28 DIAGNOSIS — Z7982 Long term (current) use of aspirin: Secondary | ICD-10-CM | POA: Insufficient documentation

## 2012-02-28 LAB — CARDIAC PANEL(CRET KIN+CKTOT+MB+TROPI)
Relative Index: INVALID (ref 0.0–2.5)
Total CK: 79 U/L (ref 7–177)

## 2012-02-28 LAB — CBC
HCT: 33.8 % — ABNORMAL LOW (ref 36.0–46.0)
MCV: 94.2 fL (ref 78.0–100.0)
Platelets: 260 10*3/uL (ref 150–400)
RBC: 3.59 MIL/uL — ABNORMAL LOW (ref 3.87–5.11)
RDW: 16.7 % — ABNORMAL HIGH (ref 11.5–15.5)
WBC: 7 10*3/uL (ref 4.0–10.5)

## 2012-02-28 LAB — PROTIME-INR
INR: 0.94 (ref 0.00–1.49)
Prothrombin Time: 12.8 seconds (ref 11.6–15.2)

## 2012-02-28 LAB — BASIC METABOLIC PANEL
CO2: 26 mEq/L (ref 19–32)
Chloride: 101 mEq/L (ref 96–112)
Glucose, Bld: 100 mg/dL — ABNORMAL HIGH (ref 70–99)
Sodium: 136 mEq/L (ref 135–145)

## 2012-02-28 LAB — URINALYSIS, ROUTINE W REFLEX MICROSCOPIC
Nitrite: NEGATIVE
Specific Gravity, Urine: 1.019 (ref 1.005–1.030)
Urobilinogen, UA: 0.2 mg/dL (ref 0.0–1.0)
pH: 6 (ref 5.0–8.0)

## 2012-02-28 LAB — DIFFERENTIAL
Basophils Absolute: 0 10*3/uL (ref 0.0–0.1)
Lymphocytes Relative: 31 % (ref 12–46)
Lymphs Abs: 2.2 10*3/uL (ref 0.7–4.0)
Monocytes Absolute: 0.6 10*3/uL (ref 0.1–1.0)
Neutro Abs: 4 10*3/uL (ref 1.7–7.7)

## 2012-02-28 LAB — URINE MICROSCOPIC-ADD ON

## 2012-02-28 MED ORDER — IBUPROFEN 800 MG PO TABS: 800.0000 mg | ORAL_TABLET | Freq: Three times a day (TID) | ORAL | Status: AC

## 2012-02-28 MED ORDER — TECHNETIUM TO 99M ALBUMIN AGGREGATED
3.0000 | Freq: Once | INTRAVENOUS | Status: AC | PRN
  Administered 2012-02-28: 3 via INTRAVENOUS

## 2012-02-28 MED ORDER — KETOROLAC TROMETHAMINE 30 MG/ML IJ SOLN
30.0000 mg | Freq: Once | INTRAMUSCULAR | Status: AC
  Administered 2012-02-28: 30 mg via INTRAVENOUS
  Filled 2012-02-28: qty 1

## 2012-02-28 NOTE — ED Notes (Signed)
Pt began having CP on Sunday in her left chest.  Pain increases with bending down, moving, coughing and deep breathing.  Pt has had some sob with this.  No nausea or diaphoresis.  Pt denies any cough or cold.

## 2012-02-28 NOTE — ED Provider Notes (Signed)
History     CSN: [id number]  Arrival date & time 02/28/12  4098   First MD Initiated Contact with Patient 02/28/12 1028      Chief Complaint  Patient presents with  . Chest Pain    (Consider location/radiation/quality/duration/timing/severity/associated sxs/prior treatment) HPI Comments: Patient presents with 3 days of left-sided chest pain that worsens with movement, coughing, deep breathing and bending down. As associated with shortness of breath. The pain has been constant it does not radiate. It is reproducible to palpation on her left chest wall. She denies any injury, cough, fever, cold. She admits to a history of pulmonary embolism for which she was on Coumadin for years ago. I do not see this in her records.   The history is provided by the patient.    Past Medical History  Diagnosis Date  . Rectal bleeding 2007    internal hemmorhoids by colonoscopy in 04/2006, Bx neg for IBD  . Duodenitis     secondary to H pylori(treatment completed 05/2006), +/- NSAIDS  . Alcoholism   . History of cocaine abuse   . Anxiety   . Depression   . Anemia     Past Surgical History  Procedure Date  . Cesarean section     Family History  Problem Relation Age of Onset  . Breast cancer      1st degree relative <50  . Coronary artery disease      1 st degree female relative <60 and female <50  . Diabetes type II      1 st degree relative    History  Substance Use Topics  . Smoking status: Current Everyday Smoker -- 0.2 packs/day    Types: Cigarettes  . Smokeless tobacco: Not on file  . Alcohol Use: Yes     beer    OB History    Grav Para Term Preterm Abortions TAB SAB Ect Mult Living                  Review of Systems  Constitutional: Negative for fever, activity change and appetite change.  HENT: Negative for congestion and rhinorrhea.   Respiratory: Positive for chest tightness and shortness of breath.   Cardiovascular: Positive for chest pain. Negative for leg  swelling.  Gastrointestinal: Negative for nausea, vomiting and abdominal pain.  Genitourinary: Negative for dysuria and hematuria.  Musculoskeletal: Negative for back pain.  Neurological: Negative for dizziness and headaches.    Allergies  Omnipaque  Home Medications   Current Outpatient Rx  Name Route Sig Dispense Refill  . ASPIRIN 81 MG PO TABS Oral Take 1 tablet (81 mg total) by mouth daily. 30 tablet 3  . CLONIDINE HCL 0.1 MG PO TABS  1 tablet by mouth once daily 60 tablet 6  . CYANOCOBALAMIN 1000 MCG/ML IJ SOLN Subcutaneous Inject 1,000 mcg into the skin every 30 (thirty) days.      Marland Kitchen FAMOTIDINE 20 MG PO TABS Oral Take 1 tablet (20 mg total) by mouth 2 (two) times daily. 60 tablet 6  . LORATADINE 10 MG PO TABS Oral Take 10 mg by mouth daily. For allergies     . PSYLLIUM 30.9 % PO POWD Oral Take by mouth. Use as directed     . ZOLPIDEM TARTRATE 5 MG PO TABS Oral Take 1 tablet (5 mg total) by mouth at bedtime as needed for sleep. 30 tablet 1    BP 124/89  Pulse 72  Temp(Src) 98 F (36.7 C) (Oral)  Resp 15  SpO2 100%  Physical Exam  Constitutional: She is oriented to person, place, and time. She appears well-developed and well-nourished. No distress.  HENT:  Head: Normocephalic and atraumatic.  Mouth/Throat: Oropharynx is clear and moist. No oropharyngeal exudate.  Eyes: Conjunctivae and EOM are normal. Pupils are equal, round, and reactive to light.  Neck: Normal range of motion.  Cardiovascular: Normal rate, regular rhythm and normal heart sounds.   No murmur heard. Pulmonary/Chest: Effort normal and breath sounds normal. No respiratory distress. She exhibits tenderness.       Reproducible left-sided chest wall tenderness inferior rib margin. No rash  Abdominal: Soft. There is no tenderness. There is no rebound and no guarding.  Musculoskeletal: Normal range of motion. She exhibits no edema and no tenderness.  Neurological: She is alert and oriented to person, place,  and time. No cranial nerve deficit.  Skin: Skin is warm. She is not diaphoretic.    ED Course  Procedures (including critical care time)  Labs Reviewed  CBC - Abnormal; Notable for the following:    RBC 3.59 (*)    Hemoglobin 11.0 (*)    HCT 33.8 (*)    RDW 16.7 (*)    All other components within normal limits  BASIC METABOLIC PANEL - Abnormal; Notable for the following:    Glucose, Bld 100 (*)    All other components within normal limits  URINALYSIS, ROUTINE W REFLEX MICROSCOPIC - Abnormal; Notable for the following:    Color, Urine AMBER (*) BIOCHEMICALS MAY BE AFFECTED BY COLOR   APPearance CLOUDY (*)    Leukocytes, UA SMALL (*)    All other components within normal limits  URINE MICROSCOPIC-ADD ON - Abnormal; Notable for the following:    Squamous Epithelial / LPF MANY (*)    All other components within normal limits  CARDIAC PANEL(CRET KIN+CKTOT+MB+TROPI)  DIFFERENTIAL  PROTIME-INR  CARDIAC PANEL(CRET KIN+CKTOT+MB+TROPI)   Dg Chest 2 View  02/28/2012  *RADIOLOGY REPORT*  Clinical Data: Chest pain  CHEST - 2 VIEW  Comparison: 05/16/2009  Findings: Heart size and mediastinal contours are unremarkable.  There is no pleural effusion or pulmonary edema.  No airspace consolidation identified.  Review of the visualized osseous structures is unremarkable.  IMPRESSION:  1.  No active cardiopulmonary abnormalities.  Original Report Authenticated By: Rosealee Albee, M.D.     No diagnosis found.    MDM  Chest wall pain with associated shortness of breath. Pain is pleuritic and worse with movement. Atypical for ACS the patient has history of pulmonary embolism.  VQ scan given patient's contrast allergy. She states contrast causes seizures.  EKG nonischemic. Troponin negative. VQ scan "normal bilateral lung perfusion".  Atypical for ACS. No evidence of pulmonary embolism. We'll treat as pleurisy.   Date: 02/28/2012  Rate: 76  Rhythm: normal sinus rhythm  QRS Axis: normal   Intervals: normal  ST/T Wave abnormalities: normal  Conduction Disutrbances:left bundle branch block  Narrative Interpretation:   Old EKG Reviewed: unchanged         Glynn Octave, MD 02/28/12 [phone]

## 2012-02-28 NOTE — Discharge Instructions (Signed)
Pleurisy  Pleurisy is an inflammation and swelling of the lining of the lungs. It usually is the result of an underlying infection or other disease. Because of this inflammation, it hurts to breathe. It is aggravated by coughing or deep breathing. The primary goal in treating pleurisy is to diagnose and treat the condition that caused it.   HOME CARE INSTRUCTIONS    Only take over-the-counter or prescription medicines for pain, discomfort, or fever as directed by your caregiver.   If medications which kill germs (antibiotics) were prescribed, take the entire course. Even if you are feeling better, you need to take them.   Use a cool mist vaporizer to help loosen secretions. This is so the secretions can be coughed up more easily.  SEEK MEDICAL CARE IF:    Your pain is not controlled with medication or is increasing.   You have an increase inpus like (purulent) secretions brought up with coughing.  SEEK IMMEDIATE MEDICAL CARE IF:    You have blue or dark lips, fingernails, or toenails.   You begin coughing up blood.   You have increased difficulty breathing.   You have continuing pain unrelieved by medicine or lasting more than 1 week.   You have pain that radiates into your neck, arms, or jaw.   You develop increased shortness of breath or wheezing.   You develop a fever, rash, vomiting, fainting, or other serious complaints.  Document Released: 10/16/2005 Document Revised: 10/05/2011 Document Reviewed: 05/17/2007  [employer] Patient Information 2012 [employer].

## 2012-02-28 NOTE — ED Notes (Signed)
Care transferred and report given to Berkley, RN 

## 2012-02-28 NOTE — ED Notes (Signed)
Pt taken to VQ scan, will give medication when pt returns.

## 2012-02-28 NOTE — ED Notes (Signed)
Patient described pain as "not really chest, more left side".

## 2012-02-28 NOTE — ED Notes (Signed)
Patient returned from radiology- states pain in left side only with movement.  Denies any needs at present.  Monitors reapplied.

## 2012-04-08 ENCOUNTER — Ambulatory Visit (INDEPENDENT_AMBULATORY_CARE_PROVIDER_SITE_OTHER): Payer: Self-pay | Admitting: Internal Medicine

## 2012-04-08 ENCOUNTER — Encounter: Payer: Self-pay | Admitting: Internal Medicine

## 2012-04-08 VITALS — BP 121/76 | HR 99 | Temp 98.0°F | Ht 64.0 in | Wt 126.4 lb

## 2012-04-08 DIAGNOSIS — I10 Essential (primary) hypertension: Secondary | ICD-10-CM

## 2012-04-08 DIAGNOSIS — I2699 Other pulmonary embolism without acute cor pulmonale: Secondary | ICD-10-CM

## 2012-04-08 DIAGNOSIS — Z720 Tobacco use: Secondary | ICD-10-CM | POA: Insufficient documentation

## 2012-04-08 DIAGNOSIS — F17200 Nicotine dependence, unspecified, uncomplicated: Secondary | ICD-10-CM

## 2012-04-08 DIAGNOSIS — W57XXXA Bitten or stung by nonvenomous insect and other nonvenomous arthropods, initial encounter: Secondary | ICD-10-CM

## 2012-04-08 DIAGNOSIS — D518 Other vitamin B12 deficiency anemias: Secondary | ICD-10-CM

## 2012-04-08 MED ORDER — FAMOTIDINE 20 MG PO TABS: 20.0000 mg | ORAL_TABLET | Freq: Two times a day (BID) | ORAL | Status: DC

## 2012-04-08 MED ORDER — CYANOCOBALAMIN 1000 MCG/ML IJ SOLN
1000.0000 ug | Freq: Once | INTRAMUSCULAR | Status: AC
  Administered 2012-04-08: 1000 ug via INTRAMUSCULAR

## 2012-04-08 MED ORDER — CYANOCOBALAMIN 1000 MCG/ML IJ SOLN: 1000.0000 ug | Freq: Once | INTRAMUSCULAR | Status: DC

## 2012-04-08 MED ORDER — CLONIDINE HCL 0.1 MG PO TABS: ORAL_TABLET | ORAL | Status: DC

## 2012-04-08 NOTE — Assessment & Plan Note (Signed)
She is trying to quit smoking -participating in a counseling program. She has cut down to one cigarette a day. She was encouraged to continue doing the same.

## 2012-04-08 NOTE — Progress Notes (Signed)
  Subjective:    [name], female    DOB: [birth year], 51 y.o.   MRN: [id number]  HPI: 51 year old woman with past medical history significant for hypertension, vitamin B12 deficiency comes to the clinic or a followup visit.  She reports noticing new onset skin lesions on her arm and one spot on her right thigh for last 2-3 weeks. She states that these lesions waxes and pain gets better with the shower. Denies any associated itching pain or burning. Denies using any new detergents, soaps or cosmetics. Also denies going to the woods or any insect or tick bites.  She states that she is undergoing menopause and is experiencing a lot of hot flashes and mood changes but she is trying to cope up with it.     Review of Systems  Constitutional: Negative for fever, chills and fatigue.  HENT: Negative for congestion, rhinorrhea, sneezing, postnasal drip and ear discharge.   Eyes: Negative for visual disturbance.  Respiratory: Negative for cough, chest tightness and shortness of breath.   Cardiovascular: Negative for chest pain and palpitations.  Musculoskeletal: Negative for arthralgias.  Neurological: Negative for syncope, speech difficulty and weakness.  Hematological: Negative for adenopathy.  Psychiatric/Behavioral: Negative for agitation.       Objective:   Physical Exam  Constitutional: She is oriented to person, place, and time. She appears well-developed and well-nourished. No distress.  HENT:  Head: Normocephalic and atraumatic.  Mouth/Throat: No oropharyngeal exudate.  Eyes: Conjunctivae and EOM are normal. Pupils are equal, round, and reactive to light.  Neck: Normal range of motion. Neck supple. No JVD present. No tracheal deviation present. No thyromegaly present.  Cardiovascular: Normal rate and regular rhythm.  Exam reveals no gallop and no friction rub.   No murmur heard. Pulmonary/Chest: Effort normal and breath sounds normal. No stridor. No respiratory  distress. She has no wheezes. She has no rales.  Abdominal: Soft. Bowel sounds are normal. She exhibits no distension. There is no tenderness. There is no rebound.  Musculoskeletal: Normal range of motion. She exhibits no edema and no tenderness.  Lymphadenopathy:    She has no cervical adenopathy.  Neurological: She is alert and oriented to person, place, and time. She has normal reflexes. She displays normal reflexes. No cranial nerve deficit. Coordination normal.  Skin: Skin is warm. She is not diaphoretic.       About 2 cm in diameter circular maculopapular lesion on the thigh. 2-3 small 1cm lesion on the ventral aspect of her right forearm with some bite marks.          Assessment & Plan:

## 2012-04-08 NOTE — Assessment & Plan Note (Signed)
Lab Results  Component Value Date   NA 136 02/28/2012   K 3.5 02/28/2012   CL 101 02/28/2012   CO2 26 02/28/2012   BUN 10 02/28/2012   CREATININE 0.57 02/28/2012   CREATININE 0.64 10/05/2011    BP Readings from Last 3 Encounters:  04/08/12 121/76  02/28/12 126/83  11/27/11 127/86    Assessment: Hypertension control:  controlled  Progress toward goals:  at goal Barriers to meeting goals:  no barriers identified  Plan: Hypertension treatment:  continue current medications

## 2012-04-08 NOTE — Patient Instructions (Signed)
Please schedule a follow up appointment in 3 months. Please bring your medication bottles with your next appointment. Please take your medicines as prescribed.  

## 2012-04-08 NOTE — Assessment & Plan Note (Addendum)
She has noticed new skin lesions on her right arm and right thigh for last 2-3 weeks. On exam, she has a small circular maculopapular lesions with some string/bite  marks. I think this is most likely insect bite.  - Advised her to clean it with soap and water. - Call us if she develops any swelling and redness in the area. - Reassured her that they will get better in few weeks.

## 2012-04-08 NOTE — Assessment & Plan Note (Addendum)
She was given a B-12 shot here.  - Advised her to come on monthly basis for Vit B-12 shots.

## 2012-05-27 ENCOUNTER — Ambulatory Visit (INDEPENDENT_AMBULATORY_CARE_PROVIDER_SITE_OTHER): Payer: Self-pay | Admitting: Internal Medicine

## 2012-05-27 ENCOUNTER — Encounter: Payer: Self-pay | Admitting: Internal Medicine

## 2012-05-27 VITALS — BP 137/81 | HR 74 | Temp 98.0°F | Ht 64.0 in | Wt 127.8 lb

## 2012-05-27 DIAGNOSIS — R21 Rash and other nonspecific skin eruption: Secondary | ICD-10-CM

## 2012-05-27 DIAGNOSIS — I10 Essential (primary) hypertension: Secondary | ICD-10-CM

## 2012-05-27 DIAGNOSIS — E538 Deficiency of other specified B group vitamins: Secondary | ICD-10-CM

## 2012-05-27 DIAGNOSIS — T148 Other injury of unspecified body region: Secondary | ICD-10-CM

## 2012-05-27 DIAGNOSIS — W57XXXA Bitten or stung by nonvenomous insect and other nonvenomous arthropods, initial encounter: Secondary | ICD-10-CM

## 2012-05-27 DIAGNOSIS — D518 Other vitamin B12 deficiency anemias: Secondary | ICD-10-CM

## 2012-05-27 MED ORDER — CYANOCOBALAMIN 1000 MCG/ML IJ SOLN
1000.0000 ug | INTRAMUSCULAR | Status: AC
  Administered 2012-05-27: 1000 ug via INTRAMUSCULAR

## 2012-05-27 MED ORDER — DIPHENHYDRAMINE HCL 50 MG PO TABS: 50.0000 mg | ORAL_TABLET | Freq: Every evening | ORAL | Status: DC | PRN

## 2012-05-27 MED ORDER — CYANOCOBALAMIN 1000 MCG/ML IJ SOLN: 1000.0000 ug | Freq: Once | INTRAMUSCULAR | Status: DC

## 2012-05-27 NOTE — Patient Instructions (Addendum)
Please schedule a follow up appointment in 3 months . Please bring your medication bottles with your next appointment. Please take your medicines as prescribed. I will call you with your lab results if anything will be abnormal. 

## 2012-05-27 NOTE — Progress Notes (Signed)
  Subjective:    [name], female    DOB: 1961/10/26, 51 y.o.   MRN: [id number]  HPI: 51 year old woman with past medical history significant for hypertension, vitamin B12 deficiency anemia comes to the clinic for a follow up visit.  She continues to complain of the rash that she noticed about 2-3 months ago. She also reported the same rash when she was here last time (about a month ago). The rash is maculopapular in nature and is distributed on the dorsal aspect of her arms (shortly after the end of her shirt sleeves) and on her legs. She also reported noticing occasional rash on the back. This rash is associated with itching and redness and flares up during the night and distress as per the patient. She doesn't think that  it's insect bites because she treated her bedding with some medication, and then she insists that she doesn't do a lot of outdoor stuff that makes her think that these are not mosquito bites.    She complains of vague elbow pains that started about 2 weeks ago and complains of chronic knee pain but denies any morning stiffness, macular rash, photosensitivity, or Raynaud's phenomena.    Review of Systems  Constitutional: Negative for fever, appetite change and fatigue.  HENT: Negative for hearing loss, nosebleeds, congestion, sneezing and postnasal drip.   Respiratory: Negative for apnea, cough, choking, chest tightness and shortness of breath.   Cardiovascular: Negative for chest pain, palpitations and leg swelling.  Gastrointestinal: Negative for nausea and constipation.  Musculoskeletal: Negative for back pain and arthralgias.  Skin: Positive for rash.  Neurological: Negative for dizziness, facial asymmetry, light-headedness and numbness.  Hematological: Negative for adenopathy.       Objective:   Physical Exam  Constitutional: She is oriented to person, place, and time. She appears well-developed and well-nourished. No distress.  HENT:  Head:  Normocephalic and atraumatic.  Right Ear: External ear normal.  Left Ear: External ear normal.  Mouth/Throat: No oropharyngeal exudate.  Eyes: Conjunctivae and EOM are normal. Pupils are equal, round, and reactive to light. Right eye exhibits no discharge. Left eye exhibits no discharge.  Neck: Normal range of motion. Neck supple. No JVD present. No tracheal deviation present. No thyromegaly present.  Cardiovascular: Normal rate, regular rhythm, normal heart sounds and intact distal pulses.  Exam reveals no gallop and no friction rub.   No murmur heard. Pulmonary/Chest: Effort normal and breath sounds normal. No stridor. No respiratory distress. She has no wheezes.  Abdominal: Soft. Bowel sounds are normal. She exhibits no distension. There is no tenderness. There is no rebound and no guarding.  Musculoskeletal: Normal range of motion. She exhibits no edema and no tenderness.  Lymphadenopathy:    She has no cervical adenopathy.  Neurological: She is alert and oriented to person, place, and time. She has normal reflexes. No cranial nerve deficit. Coordination normal.  Skin: Skin is warm. Rash noted. She is not diaphoretic.       Small circular maculopapular rash on the arms and legs( exposed areas), associated with itch marks          Assessment & Plan:

## 2012-05-27 NOTE — Assessment & Plan Note (Signed)
Lab Results  Component Value Date   NA 136 02/28/2012   K 3.5 02/28/2012   CL 101 02/28/2012   CO2 26 02/28/2012   BUN 10 02/28/2012   CREATININE 0.57 02/28/2012   CREATININE 0.64 10/05/2011    BP Readings from Last 3 Encounters:  05/27/12 137/81  04/08/12 121/76  02/28/12 126/83    Assessment: Hypertension control:  controlled  Progress toward goals:  deteriorated Barriers to meeting goals:  no barriers identified  Plan: Hypertension treatment:  continue current medications

## 2012-05-27 NOTE — Assessment & Plan Note (Signed)
Continues to complain of rash on her arms and legs. Although patient is very adamant that it doesn't appear to be insect bites but given the pattern and distribution it appears the most likely diagnosis. She reports vague elbow and knee pains but denies any other joint pains, morning stiffness, oral ulcers, photosensitivity,  that makes other possibilities of rheumatological disorders like lupus very less likely on our differentials. -Patient was again reassured that these are insect bites. -A prescription for Benadryl was given to help her with itching. -If her rash persists with future visits we may consider getting labs like ANA to rule out any rheumatological process or may consider referring her to a dermatologist.

## 2012-05-27 NOTE — Assessment & Plan Note (Signed)
She was given a B12 shot today.

## 2012-06-26 ENCOUNTER — Encounter: Payer: Self-pay | Admitting: Gastroenterology

## 2012-07-24 ENCOUNTER — Ambulatory Visit (INDEPENDENT_AMBULATORY_CARE_PROVIDER_SITE_OTHER): Payer: No Typology Code available for payment source | Admitting: Gastroenterology

## 2012-07-24 ENCOUNTER — Encounter: Payer: Self-pay | Admitting: Gastroenterology

## 2012-07-24 VITALS — BP 120/80 | HR 80 | Ht 64.25 in | Wt 123.8 lb

## 2012-07-24 DIAGNOSIS — K648 Other hemorrhoids: Secondary | ICD-10-CM

## 2012-07-24 MED ORDER — HYDROCORTISONE ACETATE 25 MG RE SUPP: 25.0000 mg | Freq: Two times a day (BID) | RECTAL | Status: DC

## 2012-07-24 NOTE — Progress Notes (Signed)
History of Present Illness: 51 year old Afro-American female referred for evaluation of hemorrhoids. She complains of intermittent rectal pain. She takes a high-fiber diet. She denies rectal bleeding. Last colonoscopy approximately 5 or 6 years ago for rectal bleeding was unrevealing. Hemoglobin is stable.    Past Medical History  Diagnosis Date  . Rectal bleeding 2007    internal hemmorhoids by colonoscopy in 04/2006, Bx neg for IBD  . Duodenitis     secondary to H pylori(treatment completed 05/2006), +/- NSAIDS  . Alcoholism   . History of cocaine abuse   . Anxiety   . Depression   . Anemia    Past Surgical History  Procedure Date  . Cesarean section    family history includes Breast cancer in her mother, sister, and unspecified family member; Colon cancer in her father; Coronary artery disease in an unspecified family member; Diabetes in her brother, mother, and sister; Diabetes type II in an unspecified family member; and Heart disease in her brother, father, and mother. Current Outpatient Prescriptions  Medication Sig Dispense Refill  . cloNIDine (CATAPRES) 0.1 MG tablet 1 tablet by mouth once daily  60 tablet  6  . cyanocobalamin (,VITAMIN B-12,) 1000 MCG/ML injection Inject 1 mL (1,000 mcg total) into the muscle once.  1 mL  0  . famotidine (PEPCID) 20 MG tablet Take 1 tablet (20 mg total) by mouth 2 (two) times daily.  60 tablet  6  . loratadine (CLARITIN) 10 MG tablet Take 10 mg by mouth daily. For allergies       . Psyllium (METAMUCIL) 30.9 % POWD Take by mouth. Use as directed       . zolpidem (AMBIEN) 5 MG tablet Take 1 tablet (5 mg total) by mouth at bedtime as needed for sleep.  30 tablet  1   Current Facility-Administered Medications  Medication Dose Route Frequency Provider Last Rate Last Dose  . cyanocobalamin ((VITAMIN B-12)) injection 1,000 mcg  1,000 mcg Intramuscular Q30 days Elyse Jarvis, MD   1,000 mcg at 05/27/12 1744   Allergies as of 07/24/2012 - Review  Complete 07/24/2012  Allergen Reaction Noted  . Omnipaque (iohexol) Other (See Comments) 12/26/2010    reports that she has been smoking Cigarettes.  She has been smoking about .25 packs per day. She has never used smokeless tobacco. She reports that she drinks alcohol. She reports that she does not use illicit drugs.     Review of Systems: Pertinent positive and negative review of systems were noted in the above HPI section. All other review of systems were otherwise negative.  Vital signs were reviewed in today's medical record Physical Exam: General: Well developed , well nourished, no acute distress Head: Normocephalic and atraumatic Eyes:  sclerae anicteric, EOMI Ears: Normal auditory acuity Mouth: No deformity or lesions Neck: Supple, no masses or thyromegaly Lungs: Clear throughout to auscultation Heart: Regular rate and rhythm; no murmurs, rubs or bruits Abdomen: Soft, non tender and non distended. No masses, hepatosplenomegaly or hernias noted. Normal Bowel sounds Rectal: There are no external lesions Musculoskeletal: Symmetrical with no gross deformities  Skin: No lesions on visible extremities Pulses:  Normal pulses noted Extremities: No clubbing, cyanosis, edema or deformities noted Neurological: Alert oriented x 4, grossly nonfocal Cervical Nodes:  No significant cervical adenopathy Inguinal Nodes: No significant inguinal adenopathy Psychological:  Alert and cooperative. Normal mood and affect

## 2012-07-24 NOTE — Assessment & Plan Note (Signed)
Plan Anusol HC suppositories. If symptoms should persist I would consider band ligation

## 2012-07-24 NOTE — Patient Instructions (Addendum)
We have contacted Dr Jory Ee office regarding her reasoning for your referral. We do not want to schedule any procedures unnecessarily if it was not her intent to have you get colonoscopy. We will contact you with answers once we hear back from Dr Jory Ee office. CC: Dr Dorthula Rue

## 2012-07-25 ENCOUNTER — Ambulatory Visit: Payer: Self-pay | Admitting: Internal Medicine

## 2012-08-07 ENCOUNTER — Observation Stay (HOSPITAL_COMMUNITY)
Admission: EM | Admit: 2012-08-07 | Discharge: 2012-08-09 | Disposition: A | Discharge status: Home / Self Care | Payer: No Typology Code available for payment source | Attending: Internal Medicine | Admitting: Internal Medicine

## 2012-08-07 ENCOUNTER — Emergency Department (HOSPITAL_COMMUNITY): Payer: Self-pay

## 2012-08-07 ENCOUNTER — Encounter (HOSPITAL_COMMUNITY): Payer: Self-pay | Admitting: Emergency Medicine

## 2012-08-07 DIAGNOSIS — R042 Hemoptysis: Secondary | ICD-10-CM

## 2012-08-07 DIAGNOSIS — F1910 Other psychoactive substance abuse, uncomplicated: Secondary | ICD-10-CM

## 2012-08-07 DIAGNOSIS — E872 Acidosis, unspecified: Principal | ICD-10-CM | POA: Insufficient documentation

## 2012-08-07 DIAGNOSIS — F1410 Cocaine abuse, uncomplicated: Secondary | ICD-10-CM | POA: Insufficient documentation

## 2012-08-07 DIAGNOSIS — E876 Hypokalemia: Secondary | ICD-10-CM | POA: Insufficient documentation

## 2012-08-07 DIAGNOSIS — R51 Headache: Secondary | ICD-10-CM | POA: Insufficient documentation

## 2012-08-07 DIAGNOSIS — R011 Cardiac murmur, unspecified: Secondary | ICD-10-CM | POA: Insufficient documentation

## 2012-08-07 DIAGNOSIS — F1010 Alcohol abuse, uncomplicated: Secondary | ICD-10-CM | POA: Insufficient documentation

## 2012-08-07 DIAGNOSIS — E162 Hypoglycemia, unspecified: Secondary | ICD-10-CM

## 2012-08-07 HISTORY — DX: Other pulmonary embolism without acute cor pulmonale: I26.99

## 2012-08-07 LAB — COMPREHENSIVE METABOLIC PANEL
ALT: 24 U/L (ref 0–35)
AST: 71 U/L — ABNORMAL HIGH (ref 0–37)
Albumin: 3.8 g/dL (ref 3.5–5.2)
Alkaline Phosphatase: 81 U/L (ref 39–117)
BUN: 12 mg/dL (ref 6–23)
CO2: 17 mEq/L — ABNORMAL LOW (ref 19–32)
Chloride: 99 mEq/L (ref 96–112)
Chloride: 96 mEq/L (ref 96–112)
Creatinine, Ser: 0.54 mg/dL (ref 0.50–1.10)
GFR calc Af Amer: >90 mL/min (ref >90)
GFR calc non Af Amer: >90 mL/min (ref >90)
Glucose, Bld: 53 mg/dL — ABNORMAL LOW (ref 70–99)
Potassium: 4.1 mEq/L (ref 3.5–5.1)
Potassium: 3.9 mEq/L (ref 3.5–5.1)
Sodium: 135 mEq/L (ref 135–145)
Total Bilirubin: 0.4 mg/dL (ref 0.3–1.2)
Total Bilirubin: 0.4 mg/dL (ref 0.3–1.2)

## 2012-08-07 LAB — LACTIC ACID, PLASMA
Lactic Acid, Venous: 3.2 mmol/L — ABNORMAL HIGH (ref 0.5–2.2)
Lactic Acid, Venous: 1.5 mmol/L (ref 0.5–2.2)

## 2012-08-07 LAB — BASIC METABOLIC PANEL
BUN: 10 mg/dL (ref 6–23)
Calcium: 7.7 mg/dL — ABNORMAL LOW (ref 8.4–10.5)
Creatinine, Ser: 0.56 mg/dL (ref 0.50–1.10)
GFR calc Af Amer: >90 mL/min (ref >90)
GFR calc non Af Amer: >90 mL/min (ref >90)

## 2012-08-07 LAB — CBC WITH DIFFERENTIAL/PLATELET
Basophils Relative: 0 % (ref 0–1)
Basophils Relative: 0 % (ref 0–1)
Eosinophils Absolute: 0 10*3/uL (ref 0.0–0.7)
Eosinophils Relative: 0 % (ref 0–5)
Eosinophils Relative: 0 % (ref 0–5)
HCT: 34.1 % — ABNORMAL LOW (ref 36.0–46.0)
Hemoglobin: 12.2 g/dL (ref 12.0–15.0)
Lymphocytes Relative: 28 % (ref 12–46)
Lymphs Abs: 2.5 10*3/uL (ref 0.7–4.0)
MCH: 33.6 pg (ref 26.0–34.0)
MCHC: 35.8 g/dL (ref 30.0–36.0)
MCHC: 36.2 g/dL — ABNORMAL HIGH (ref 30.0–36.0)
MCV: 92.8 fL (ref 78.0–100.0)
Monocytes Absolute: 0.9 10*3/uL (ref 0.1–1.0)
Monocytes Relative: 11 % (ref 3–12)
Neutro Abs: 5.2 10*3/uL (ref 1.7–7.7)
Neutrophils Relative %: 61 % (ref 43–77)
Neutrophils Relative %: 56 % (ref 43–77)
Platelets: 244 10*3/uL (ref 150–400)
RBC: 3.65 MIL/uL — ABNORMAL LOW (ref 3.87–5.11)
RBC: 3.6 MIL/uL — ABNORMAL LOW (ref 3.87–5.11)
WBC: 8.5 10*3/uL (ref 4.0–10.5)

## 2012-08-07 LAB — D-DIMER, QUANTITATIVE: D-Dimer, Quant: 0.99 ug/mL-FEU — ABNORMAL HIGH (ref 0.00–0.48)

## 2012-08-07 LAB — RAPID STREP SCREEN (MED CTR MEBANE ONLY): Streptococcus, Group A Screen (Direct): NEGATIVE

## 2012-08-07 LAB — TROPONIN I: Troponin I: <0.3 ng/mL (ref <0.30)

## 2012-08-07 LAB — PROTIME-INR: Prothrombin Time: 14.1 seconds (ref 11.6–15.2)

## 2012-08-07 MED ORDER — PANTOPRAZOLE SODIUM 40 MG IV SOLR
40.0000 mg | Freq: Once | INTRAVENOUS | Status: AC
  Administered 2012-08-07: 40 mg via INTRAVENOUS
  Filled 2012-08-07: qty 40

## 2012-08-07 MED ORDER — DEXTROSE-NACL 5-0.45 % IV SOLN
INTRAVENOUS | Status: DC
  Administered 2012-08-08: 01:00:00 via INTRAVENOUS

## 2012-08-07 MED ORDER — ONDANSETRON HCL 4 MG/2ML IJ SOLN
4.0000 mg | Freq: Once | INTRAMUSCULAR | Status: AC
  Administered 2012-08-07: 4 mg via INTRAVENOUS
  Filled 2012-08-07: qty 2

## 2012-08-07 MED ORDER — HYDROCODONE-ACETAMINOPHEN 5-325 MG PO TABS
2.0000 | ORAL_TABLET | Freq: Once | ORAL | Status: AC
  Administered 2012-08-07: 2 via ORAL
  Filled 2012-08-07: qty 2

## 2012-08-07 MED ORDER — TECHNETIUM TO 99M ALBUMIN AGGREGATED
3.0000 | Freq: Once | INTRAVENOUS | Status: AC | PRN
  Administered 2012-08-07: 3 via INTRAVENOUS

## 2012-08-07 MED ORDER — GI COCKTAIL ~~LOC~~
30.0000 mL | Freq: Once | ORAL | Status: AC
  Administered 2012-08-07: 30 mL via ORAL
  Filled 2012-08-07: qty 30

## 2012-08-07 MED ORDER — DEXTROSE 50 % IV SOLN: 50.0000 mL | Freq: Once | INTRAVENOUS | Status: DC

## 2012-08-07 MED ORDER — SODIUM CHLORIDE 0.9 % IV BOLUS (SEPSIS)
1000.0000 mL | Freq: Once | INTRAVENOUS | Status: AC
  Administered 2012-08-07: 1000 mL via INTRAVENOUS

## 2012-08-07 NOTE — ED Notes (Signed)
Dr.Rancour notified of pts pain. New orders being placed for pain medication.

## 2012-08-07 NOTE — ED Notes (Signed)
Spoke with Tacey Ruiz from nuclear medicine informed they are coming to get patient.

## 2012-08-07 NOTE — ED Notes (Signed)
Pt returned from radiology.

## 2012-08-07 NOTE — ED Notes (Signed)
Pt returned from nuclear medicine and placed back on monitors

## 2012-08-07 NOTE — ED Notes (Signed)
Pt c/o pain when moving. Pt states "when I move I feel a sharp pain" pt gave 5/10 pain score. Pt denies nausea, dizziness, and lightheadedness.

## 2012-08-07 NOTE — ED Provider Notes (Signed)
History     CSN: [id number]  Arrival date & time 08/07/12  1349   First MD Initiated Contact with Patient 08/07/12 1827      Chief Complaint  Patient presents with  . Emesis  . Hemoptysis  . Shoulder Pain  . Knee Pain  . Headache    (Consider location/radiation/quality/duration/timing/severity/associated sxs/prior treatment) HPI Comments: Patient presents with multiple complaints which she thinks are related to stress. She woke up today with the entire right side of her body hurting which is now improved. She states she has not been sleeping well recently. She has a history of alcohol abuse and cocaine abuse. She states she got worried today when she had a cough that is productive of bloody sputum. She denies any chest pain, shortness of breath, fever, abdominal pain, nausea or vomiting. No headache or vision change.  The history is provided by the patient.    Past Medical History  Diagnosis Date  . Rectal bleeding 2007    internal hemmorhoids by colonoscopy in 04/2006, Bx neg for IBD  . Duodenitis     secondary to H pylori(treatment completed 05/2006), +/- NSAIDS  . Alcoholism   . History of cocaine abuse   . Anxiety   . Depression   . Anemia     Past Surgical History  Procedure Date  . Cesarean section     Family History  Problem Relation Age of Onset  . Breast cancer      1st degree relative <50  . Coronary artery disease      1 st degree female relative <60 and female <50  . Diabetes type II      1 st degree relative  . Breast cancer Mother   . Diabetes Mother   . Heart disease Mother   . Colon cancer Father   . Heart disease Father   . Breast cancer Sister   . Diabetes Sister   . Diabetes Brother   . Heart disease Brother     History  Substance Use Topics  . Smoking status: Current Every Day Smoker -- 0.2 packs/day    Types: Cigarettes  . Smokeless tobacco: Never Used  . Alcohol Use: Yes     beer    OB History    Grav Para Term Preterm Abortions  TAB SAB Ect Mult Living                  Review of Systems  Constitutional: Positive for fatigue. Negative for fever, activity change and appetite change.  HENT: Negative for congestion and rhinorrhea.   Respiratory: Positive for cough. Negative for chest tightness and shortness of breath.   Cardiovascular: Negative for chest pain.  Gastrointestinal: Positive for nausea. Negative for vomiting and abdominal pain.  Genitourinary: Negative for dysuria and hematuria.  Musculoskeletal: Positive for back pain.  Skin: Negative for rash.  Neurological: Positive for headaches. Negative for weakness.    Allergies  Omnipaque  Home Medications   Current Outpatient Rx  Name Route Sig Dispense Refill  . ASPIRIN EC 81 MG PO TBEC Oral Take 81 mg by mouth daily.    Marland Kitchen CLONIDINE HCL 0.1 MG PO TABS Oral Take 0.1 mg by mouth daily.    Marland Kitchen FAMOTIDINE 20 MG PO TABS Oral Take 1 tablet (20 mg total) by mouth 2 (two) times daily. 60 tablet 6    BP 127/69  Pulse 93  Temp 98.9 F (37.2 C) (Oral)  Resp 20  Ht 5' 4.25" (1.632 m)  Wt 123 lb (55.792 kg)  BMI 20.95 kg/m2  SpO2 100%  Physical Exam  Constitutional: She is oriented to person, place, and time. She appears well-developed. No distress.  HENT:  Head: Normocephalic and atraumatic.  Mouth/Throat: Oropharynx is clear and moist. No oropharyngeal exudate.  Eyes: Conjunctivae normal and EOM are normal. Pupils are equal, round, and reactive to light.  Neck: Normal range of motion. Neck supple.  Cardiovascular: Normal rate, regular rhythm and normal heart sounds.   No murmur heard.      +2 femoral, DP, PT pulses  Pulmonary/Chest: Effort normal and breath sounds normal. No respiratory distress. She exhibits no tenderness.  Abdominal: Soft. There is no tenderness. There is no rebound and no guarding.  Musculoskeletal: Normal range of motion. She exhibits no edema and no tenderness.       No CVAT  Neurological: She is alert and oriented to person,  place, and time. No cranial nerve deficit.       Cranial nerves 3-12 intact, no ataxia finger to nose, left grip strength weaker at baseline compared to right per patient. Otherwise 5 out of 5 strength throughout. No nystagmus.  Skin: Skin is warm.    ED Course  Procedures (including critical care time)  Labs Reviewed  CBC WITH DIFFERENTIAL - Abnormal; Notable for the following:    RBC 3.65 (*)     HCT 34.1 (*)     All other components within normal limits  COMPREHENSIVE METABOLIC PANEL - Abnormal; Notable for the following:    CO2 17 (*)     AST 71 (*)     All other components within normal limits  CBC WITH DIFFERENTIAL  PROTIME-INR  COMPREHENSIVE METABOLIC PANEL  TROPONIN I  D-DIMER, QUANTITATIVE  URINALYSIS, ROUTINE W REFLEX MICROSCOPIC  URINE RAPID DRUG SCREEN (HOSP PERFORMED)  RAPID STREP SCREEN   Dg Chest 2 View  08/07/2012  *RADIOLOGY REPORT*  Clinical Data: Chest pain.  Hypertension.  CHEST - 2 VIEW  Comparison: 02/28/2012  Findings: Heart size is normal.  There is calcification of the aorta.  Lungs are clear.  No effusions.  No bony abnormalities.  IMPRESSION: No active disease.   Original Report Authenticated By: Thomasenia Sales, M.D.      No diagnosis found.    MDM  Cough with hematemesis, bodyaches. Vital stable, no distress, EKG unchanged.  Chest x-ray negative. Patient no distress. EKG unchanged, troponin negative. D-dimer positive. Patient has allergy to IV contrast causes seizures. We'll proceed with VQ scan given D-dimer elevation and hemoptysis. VQ negative. The patient has persistent hypoglycemia and low bicarbonate which are likely secondary to alcoholic ketoacidosis. Will continue dextrose infusion. D/w [facility] resident Dr. Wynonia Lawman who agrees alcoholic ketoacidosis likely source of abnormalities.  He does not think patient meets admission criteria and will arrange outpatient followup. Will continue IV and PO hydration and sugar supplementation.  Care  transferred to Dr. Judd Lien at change of shift.  Date: 08/07/2012  Rate: 88  Rhythm: normal sinus rhythm  QRS Axis: normal  Intervals: normal  ST/T Wave abnormalities: normal  Conduction Disutrbances:left bundle branch block  Narrative Interpretation:   Old EKG Reviewed: unchanged    Glynn Octave, MD 08/08/12 [phone]

## 2012-08-07 NOTE — ED Notes (Signed)
Onset 1 week ago right side of head headache and today states under stress. Has right shoulder to right knee pain. Pain 9/10 achy pain. Answering and following commands appropriate. States have not been sleeping well lately.

## 2012-08-07 NOTE — ED Notes (Signed)
Pt denies dizziness, lightheadedness, and shortness of breath.

## 2012-08-07 NOTE — ED Notes (Signed)
Pt denies abdominal pain. States that she threw up blood this morning and woke up with pain on right side of body that has been ongoing x2weeks. Pt states she feels like she has to throw up but can't.

## 2012-08-07 NOTE — ED Notes (Signed)
Pt transported to nuclear medicine. 

## 2012-08-07 NOTE — ED Notes (Signed)
Pt transported to xray 

## 2012-08-08 ENCOUNTER — Encounter (HOSPITAL_COMMUNITY): Payer: Self-pay | Admitting: Internal Medicine

## 2012-08-08 ENCOUNTER — Observation Stay (HOSPITAL_COMMUNITY): Payer: Self-pay

## 2012-08-08 DIAGNOSIS — E876 Hypokalemia: Secondary | ICD-10-CM | POA: Diagnosis present

## 2012-08-08 DIAGNOSIS — R109 Unspecified abdominal pain: Secondary | ICD-10-CM

## 2012-08-08 DIAGNOSIS — R011 Cardiac murmur, unspecified: Secondary | ICD-10-CM | POA: Diagnosis present

## 2012-08-08 DIAGNOSIS — E872 Acidosis: Secondary | ICD-10-CM | POA: Diagnosis present

## 2012-08-08 DIAGNOSIS — R0989 Other specified symptoms and signs involving the circulatory and respiratory systems: Secondary | ICD-10-CM

## 2012-08-08 DIAGNOSIS — R042 Hemoptysis: Secondary | ICD-10-CM

## 2012-08-08 LAB — APTT: aPTT: 36 seconds (ref 24–37)

## 2012-08-08 LAB — RAPID URINE DRUG SCREEN, HOSP PERFORMED
Barbiturates: NOT DETECTED
Cocaine: NOT DETECTED
Tetrahydrocannabinol: NOT DETECTED

## 2012-08-08 LAB — SALICYLATE LEVEL: Salicylate Lvl: <2 mg/dL — ABNORMAL LOW (ref 2.8–20.0)

## 2012-08-08 LAB — BASIC METABOLIC PANEL
CO2: 15 mEq/L — ABNORMAL LOW (ref 19–32)
Calcium: 7.7 mg/dL — ABNORMAL LOW (ref 8.4–10.5)
Potassium: 3.6 mEq/L (ref 3.5–5.1)
Sodium: 133 mEq/L — ABNORMAL LOW (ref 135–145)

## 2012-08-08 LAB — POCT I-STAT 3, VENOUS BLOOD GAS (G3P V)
TCO2: 18 mmol/L (ref 0–100)
pCO2, Ven: 29.5 mmHg — ABNORMAL LOW (ref 45.0–50.0)
pH, Ven: 7.364 — ABNORMAL HIGH (ref 7.250–7.300)
pO2, Ven: 40 mmHg (ref 30.0–45.0)

## 2012-08-08 LAB — URINALYSIS, ROUTINE W REFLEX MICROSCOPIC
Ketones, ur: 40 mg/dL — AB
Nitrite: NEGATIVE
Protein, ur: 30 mg/dL — AB
Urobilinogen, UA: 0.2 mg/dL (ref 0.0–1.0)

## 2012-08-08 LAB — ETHANOL: Alcohol, Ethyl (B): <11 mg/dL (ref 0–11)

## 2012-08-08 LAB — URINE MICROSCOPIC-ADD ON

## 2012-08-08 LAB — PROTIME-INR: Prothrombin Time: 13.6 seconds (ref 11.6–15.2)

## 2012-08-08 LAB — MAGNESIUM: Magnesium: 1.6 mg/dL (ref 1.5–2.5)

## 2012-08-08 MED ORDER — LORAZEPAM 1 MG PO TABS: 1.0000 mg | ORAL_TABLET | Freq: Four times a day (QID) | ORAL | Status: DC | PRN

## 2012-08-08 MED ORDER — ADULT MULTIVITAMIN W/MINERALS CH
1.0000 | ORAL_TABLET | Freq: Every day | ORAL | Status: DC
  Administered 2012-08-08 – 2012-08-09 (×2): 1 via ORAL
  Filled 2012-08-08 (×2): qty 1

## 2012-08-08 MED ORDER — SODIUM CHLORIDE 0.9 % IV SOLN
1.0000 g | Freq: Once | INTRAVENOUS | Status: AC
  Administered 2012-08-08: 1 g via INTRAVENOUS
  Filled 2012-08-08: qty 10

## 2012-08-08 MED ORDER — ONDANSETRON HCL 4 MG/2ML IJ SOLN: 4.0000 mg | Freq: Four times a day (QID) | INTRAMUSCULAR | Status: DC | PRN

## 2012-08-08 MED ORDER — ONDANSETRON HCL 4 MG PO TABS: 4.0000 mg | ORAL_TABLET | Freq: Four times a day (QID) | ORAL | Status: DC | PRN

## 2012-08-08 MED ORDER — LORAZEPAM 2 MG/ML IJ SOLN: 1.0000 mg | Freq: Four times a day (QID) | INTRAMUSCULAR | Status: DC | PRN

## 2012-08-08 MED ORDER — OXYCODONE HCL 5 MG PO TABS: 5.0000 mg | ORAL_TABLET | ORAL | Status: DC | PRN

## 2012-08-08 MED ORDER — VITAMIN B-1 100 MG PO TABS
100.0000 mg | ORAL_TABLET | Freq: Every day | ORAL | Status: DC
  Administered 2012-08-08 – 2012-08-09 (×2): 100 mg via ORAL
  Filled 2012-08-08 (×2): qty 1

## 2012-08-08 MED ORDER — LORAZEPAM 2 MG/ML IJ SOLN
1.0000 mg | Freq: Once | INTRAMUSCULAR | Status: AC
  Administered 2012-08-08: 1 mg via INTRAVENOUS
  Filled 2012-08-08: qty 1

## 2012-08-08 MED ORDER — DEXTROSE-NACL 5-0.9 % IV SOLN
INTRAVENOUS | Status: DC
  Administered 2012-08-08 – 2012-08-09 (×5): via INTRAVENOUS

## 2012-08-08 MED ORDER — MAGNESIUM SULFATE 40 MG/ML IJ SOLN
2.0000 g | Freq: Once | INTRAMUSCULAR | Status: AC
  Administered 2012-08-08: 2 g via INTRAVENOUS
  Filled 2012-08-08 (×2): qty 50

## 2012-08-08 MED ORDER — FOLIC ACID 1 MG PO TABS
1.0000 mg | ORAL_TABLET | Freq: Every day | ORAL | Status: DC
  Administered 2012-08-08 – 2012-08-09 (×2): 1 mg via ORAL
  Filled 2012-08-08 (×2): qty 1

## 2012-08-08 MED ORDER — ASPIRIN EC 81 MG PO TBEC
81.0000 mg | DELAYED_RELEASE_TABLET | Freq: Every day | ORAL | Status: DC
  Administered 2012-08-08 – 2012-08-09 (×2): 81 mg via ORAL
  Filled 2012-08-08 (×2): qty 1

## 2012-08-08 NOTE — H&P (Addendum)
Hospital Admission Note Date: 08/08/2012  Patient name: [name] Medical record number: [id number] Date of birth: Feb 27, 1961 Age: 51 y.o. Gender: female PCP: Elyse Jarvis, MD  Medical Service: [facility]  Attending physician: Dr. Cliffton Asters     1st Contact: Dr. Burtis Junes   Pager:[phone] 2nd Contact: Dr. Anselm Jungling    [phone] After 5 pm or weekends: 1st Contact:  Intern on call   Pager: [phone] 2nd Contact:  Resident on call  Pager: [phone]  Chief Complaint: R sided head, arm and leg pain  History of Present Illness: Ms. Warmuth is a 51 yo female w pmh of DVT w PE (2010), depression, anxiety, and polysubstance abuse presenting with one day complaint of R-sided head, arm and leg pain. Pt reports that she woke up this morning with sharp, stabbing pain over her R temple, right arm, and right leg from hip to foot. She says that pain has improved since arrival to the ED. She has no visual changes/loss of vision, weakness, numbness/tingling, dizziness, or difficulty with speech.  She also reports chronic cough productive of yellow phlegm, which is worse at night. This morning she noticed some dark brown blood in phlegm and was worried, so she came to the ED. She denies any chest pain or shortness of breath.  She also reports decreased appetite recently but continued moderate alcohol use. Her last drink was this morning (liquor and beer). She denies fever/chills, abdominal pain, N/V, diarrhea, constipation, blood in stool, dysuria.   Meds: Current Outpatient Rx  Name Route Sig Dispense Refill  . ASPIRIN EC 81 MG PO TBEC Oral Take 81 mg by mouth daily.    Marland Kitchen CLONIDINE HCL 0.1 MG PO TABS Oral Take 0.1 mg by mouth daily.    Marland Kitchen FAMOTIDINE 20 MG PO TABS Oral Take 1 tablet (20 mg total) by mouth 2 (two) times daily. 60 tablet 6    Allergies: Allergies as of 08/07/2012 - Review Complete 08/07/2012  Allergen Reaction Noted  . Omnipaque (iohexol) Other (See Comments) 12/26/2010   Past Medical  History  Diagnosis Date  . Rectal bleeding 2007    internal hemmorhoids by colonoscopy in 04/2006, Bx neg for IBD  . Duodenitis     secondary to H pylori(treatment completed 05/2006), +/- NSAIDS  . Alcoholism   . History of cocaine abuse   . Anxiety   . Depression   . Anemia   . PE 05/16/2009    CT angio positive for PE in 07/10, treated with coumadin for 8 months.    Past Surgical History  Procedure Date  . Cesarean section    Family History  Problem Relation Age of Onset  . Breast cancer      1st degree relative <50  . Coronary artery disease      1 st degree female relative <60 and female <50  . Diabetes type II      1 st degree relative  . Breast cancer Mother   . Diabetes Mother   . Heart disease Mother   . Colon cancer Father   . Heart disease Father   . Breast cancer Sister   . Diabetes Sister   . Diabetes Brother   . Heart disease Brother    History   Social History  . Marital Status: Single    Spouse Name: N/A    Number of Children: 2  . Years of Education: N/A   Occupational History  . Unemployed    Social History Main Topics  . Smoking status:  Current Every Day Smoker -- 0.2 packs/day    Types: Cigarettes  . Smokeless tobacco: Never Used  . Alcohol Use: Yes     beer and liquor   . Drug Use: No     former  . Sexually Active: Not on file   Other Topics Concern  . Not on file   Social History Narrative   No cocaine or alcohol use since hospital discharge 05/19/2009.10/13 update:  States she is smoking 1-2 cigarettes daily with at least 1 drink daily usually Brandy.      Review of Systems: 10 pt ROS negative except as per HPI  Physical Exam: Blood pressure 115/71, pulse 94, temperature 98.8 F (37.1 C), temperature source Oral, resp. rate 21, height 5' 4.25" (1.632 m), weight 123 lb (55.792 kg), SpO2 96.00%. Vitals reviewed. General: resting in bed, NAD HEENT: PERRL, EOMI, no scleral icterus, no tenderness to palpation of R temporal  scalp Cardiac: Tachycardic to 110, 3/6 systolic murmur loudest at apex and radiating to axilla, no rubs or gallops Pulm: fine crackles bilateral bases, good air movement, no wheezes Abd: soft, nontender, nondistended, BS present Ext: 2+ bilateral radial and DP pulses, R PT pulse diminished compared to left, Extremities warm and well perfused, no pedal edema. Calves symmetric.  Neuro: alert and oriented X3, cranial nerves II-XII grossly intact, strength and sensation to light touch equal in bilateral upper and lower extremities  Lab results: Basic Metabolic Panel:  Basename 08/08/12 0059 08/07/12 2242  NA 133* 135  K 3.6 3.3*  CL 99 100  CO2 15* 16*  GLUCOSE 52* 59*  BUN 9 10  CREATININE 0.58 0.56  CALCIUM 7.7* 7.7*  MG -- --  PHOS -- --   Liver Function Tests:  Basename 08/07/12 1911 08/07/12 1500  AST 74* 71*  ALT 30 24  ALKPHOS 81 83  BILITOT 0.4 0.4  PROT 7.5 7.2  ALBUMIN 4.0 3.8   CBC:  Basename 08/07/12 1911 08/07/12 1500  WBC 7.7 8.5  NEUTROABS 4.3 5.2  HGB 12.1 12.2  HCT 33.4* 34.1*  MCV 92.8 93.4  PLT 244 232   Cardiac Enzymes:  Basename 08/07/12 1913  CKTOTAL --  CKMB --  CKMBINDEX --  TROPONINI <0.30   D-Dimer:  Basename 08/07/12 1911  DDIMER 0.99*   CBG:  Basename 08/08/12 0009  GLUCAP 59*   Coagulation:  Basename 08/07/12 1911  LABPROT 14.1  INR 1.10   Urine Drug Screen: Drugs of Abuse  +opiate  Urinalysis:  Basename 08/08/12 0009  COLORURINE YELLOW  LABSPEC 1.029  PHURINE 5.5  GLUCOSEU NEGATIVE  HGBUR MODERATE*  BILIRUBINUR SMALL*  KETONESUR 40*  PROTEINUR 30*  UROBILINOGEN 0.2  NITRITE NEGATIVE  LEUKOCYTESUR SMALL*    Imaging results:  Dg Chest 2 View  08/07/2012  *RADIOLOGY REPORT*  Clinical Data: Chest pain.  Hypertension.  CHEST - 2 VIEW  Comparison: 02/28/2012  Findings: Heart size is normal.  There is calcification of the aorta.  Lungs are clear.  No effusions.  No bony abnormalities.  IMPRESSION: No active  disease.   Original Report Authenticated By: Thomasenia Sales, M.D.    Nm Pulmonary Perfusion  08/07/2012  *RADIOLOGY REPORT*  Clinical Data: 51 year old female shortness of breath positive D- dimer hemoptysis.  NM PULMONARY PERFUSION PARTICULATE  Radiopharmaceutical: CURIE MAA TECHNETIUM TO 16M ALBUMIN AGGREGATED  Comparison: Chest radiographs 1848 hours the same day.Lung perfusion 02/28/2012.  Findings: Stable and homogeneous distribution of perfusion radiotracer throughout both lungs.  No perfusion defect.  IMPRESSION: Stable, normal bilateral lung perfusion.   Original Report Authenticated By: Ulla Potash III, M.D.     EKG: Sinus rhythm rate 76, LBBB, prolonged QTc ( ), no significant change when compared to previous.   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 Rosano is a 51 yo female who presented to the ED with R-sided pain that has now resolved but was noted to have anion gap metabolic acidosis which did not respond to boluses of normal saline in the ED. Pt noted to have AG of 19 and ketonuria. Blood glucose was low (52) and she has no history of DM. Blood lactic acid was 3.2 on arrival, but decreased to 1.5 after IV hydration. She does have history of alcohol abuse and recent decreased po intake. AST:ALT ratio > 2. Suspect that alcoholic ketoacidosis is principal etiology of her anion gap, with possibly an element of starvation ketoacidosis. Delta-delta is 0.8, so suspect AG acidosis is major metabolic derangement at play.   - Hydrate with D5NS overnight - thiamine/folic acid - Replaced calcium, magnesium - Checking salicylate and tylenol level.  - CIWA protocol - am Bment  2) R-sided pain Pt awoke with pain of R temple, arm and leg. She has no weakness, sensory deficits or neurological abnormalities. Pt  reported to ED physician that she suspects she slept on her R side and had some cramping in the morning. Suspect pain is musculoskeletal in nature and headache may be related to EtOH use. No tenderness to palpation of temporal artery, no visual changes. Symptoms have improved with Norco.  - Norco prn pain - ESR  3) Hemoptysis  Pt noted blood in sputum this am and has previous history of PE and DVT in 2010 for which she was treated w 8 months warfarin. Today she had + D-dimer (0.99) which prompted V/Q scan in ED (pt w contrast allergy). V/Q scan negative for perfusion defect and low probability for PE. 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 No document history of hemoglobinopathy or surgical removal spleen. 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  - Consider arterial duplex can to eval splenic artery patency  Signed: Bronson Curb 08/08/2012, 3:08 AM   Addendum: I have seen and examined Mrs. Pokorney and reviewed her care with my medical team. Today she states that her primary problem yesterday was some difficulty swallowing. She has a great deal of difficulty explaining what she means by that were telling me how long it has been going on. She does not describe painful swallowing or specific difficulty swallowing other than pills this morning.  She does recall a right sided pain but seems to downplay that now. She says that she's been under a great deal of stress recently. She was living in a 2 bedroom house and caring for an elderly friend who is largely bedridden. However, recently her son moved back in and then her daughter and her daughter's boyfriend moved in. She is also seen Rats in the  house. She states that 3-4 days ago she moved to live with a friend to reduce her stress.  She  went to alcohol and drug rehabilitation in 2006 and was sober for several years and relapsed about 4-5 years ago. She states that she does not feel that her drinking is causing problems currently. She has been losing weight unintentionally. I suspect alcohol is a major factor.  Cliffton Asters, MD [facility] [phone] pager   [phone] cell 08/08/2012, 1:24 PM

## 2012-08-08 NOTE — Progress Notes (Signed)
Lab called to request further instruction regarding pt labs, states that all lab draws today have been hemolyzed.Teaching service MD paged

## 2012-08-08 NOTE — ED Provider Notes (Signed)
Care assumed from Dr. Manus Gunning.  The bmp returned showing continued hypoglycemia, decreasing CO2.  I have called outpatient clinics who will come and admit the patient.  Geoffery Lyons, MD 08/08/12 [phone]

## 2012-08-08 NOTE — Progress Notes (Signed)
VASCULAR LAB PRELIMINARY  ARTERIAL  ABI completed:bilateral >1.0    RIGHT    LEFT    PRESSURE WAVEFORM  PRESSURE WAVEFORM  BRACHIAL 132 T BRACHIAL    DP   DP    AT 139 M-B AT 150 B  PT 164 B PT 158 B  PER   PER    GREAT TOE  NA GREAT TOE  NA    RIGHT LEFT  ABI 1.24 1.20     [name], 08/08/2012, 10:51 AM

## 2012-08-08 NOTE — Progress Notes (Signed)
INITIAL ADULT NUTRITION ASSESSMENT Date: 08/08/2012   Time: 9:21 AM  Reason for Assessment: Malnutrition Screening  INTERVENTION: 1. Advance diet as tolerated to least restrictive diet 2. Pt would benefit from supplements (Resource Breeze vs Ensure) to help replenish nutrient stores once diet is advanced 3. RD to continue to follow nutrition care plan  DOCUMENTATION CODES Per approved criteria  -Non-severe (moderate) malnutrition in the context of social or environmental circumstances   ASSESSMENT: Female 51 y.o.  Dx: Metabolic acidosis, increased anion gap (IAG)  Hx:  Past Medical History  Diagnosis Date  . Rectal bleeding 2007    internal hemmorhoids by colonoscopy in 04/2006, Bx neg for IBD  . Duodenitis     secondary to H pylori(treatment completed 05/2006), +/- NSAIDS  . Alcoholism   . History of cocaine abuse   . Anxiety   . Depression   . Anemia   . PE 05/16/2009    CT angio positive for PE in 07/10, treated with coumadin for 8 months.    Past Surgical History  Procedure Date  . Cesarean section    Related Meds:     . aspirin EC  81 mg Oral Daily  . calcium gluconate  1 g Intravenous Once  . folic acid  1 mg Oral Daily  . gi cocktail  30 mL Oral Once  . HYDROcodone-acetaminophen  2 tablet Oral Once  . LORazepam  1 mg Intravenous Once  . magnesium sulfate 1 - 4 g bolus IVPB  2 g Intravenous Once  . multivitamin with minerals  1 tablet Oral Daily  . ondansetron (ZOFRAN) IV  4 mg Intravenous Once  . pantoprazole (PROTONIX) IV  40 mg Intravenous Once  . sodium chloride  1,000 mL Intravenous Once  . sodium chloride  1,000 mL Intravenous Once  . thiamine  100 mg Oral Daily  . DISCONTD: dextrose  50 mL Intravenous Once   Ht: 5\' 4"  (162.6 cm)  Wt: 118 lb 2.7 oz (53.6 kg)  Ideal Wt: 120 lb/54.5 kg % Ideal Wt: 98%  Wt Readings from Last 15 Encounters:  08/08/12 118 lb 2.7 oz (53.6 kg)  07/24/12 123 lb 12.8 oz (56.155 kg)  05/27/12 127 lb 12.8 oz (57.97 kg)   04/08/12 126 lb 6.4 oz (57.335 kg)  11/27/11 131 lb 14.4 oz (59.829 kg)  10/05/11 128 lb (58.06 kg)  05/05/11 124 lb 12.8 oz (56.609 kg)  01/02/11 120 lb 12.8 oz (54.795 kg)  12/26/10 129 lb 11.2 oz (58.832 kg)  01/06/10 136 lb 0.6 oz (61.706 kg)  08/10/09 137 lb 6.4 oz (62.324 kg)  06/01/09 123 lb 4.8 oz (55.929 kg)  01/21/09 126 lb (57.153 kg)  11/24/08 126 lb 9.6 oz (57.425 kg)  09/21/08 129 lb 4.8 oz (58.65 kg)  Usual Wt: 123 - 127 lb % Usual Wt: 94%  Body mass index is 20.28 kg/(m^2). Weight is WNL  Food/Nutrition Related Hx: hx of alcoholism  Labs:  CMP     Component Value Date/Time   NA 133* 08/08/2012 0059   K 3.6 08/08/2012 0059   CL 99 08/08/2012 0059   CO2 15* 08/08/2012 0059   GLUCOSE 52* 08/08/2012 0059   BUN 9 08/08/2012 0059   CREATININE 0.58 08/08/2012 0059   CREATININE 0.64 10/05/2011 0904   CALCIUM 7.7* 08/08/2012 0059   PROT 7.5 08/07/2012 1911   ALBUMIN 4.0 08/07/2012 1911   AST 74* 08/07/2012 1911   ALT 30 08/07/2012 1911   ALKPHOS 81 08/07/2012 1911  BILITOT 0.4 08/07/2012 1911   GFRNONAA >90 08/08/2012 0059   GFRAA >90 08/08/2012 0059   Magnesium  Date/Time Value Range Status  08/08/2012  6:30 AM 1.6  1.5 - 2.5 mg/dL Final    No results found for this basename: phos   No intake or output data in the 24 hours ending 08/08/12 1610  Diet Order: NPO  Supplements/Tube Feeding: none  IVF:    dextrose 5 % and 0.9% NaCl Last Rate: 200 mL/hr at 08/08/12 9604  DISCONTD: dextrose 5 % and 0.45% NaCl Last Rate: Stopped (08/08/12 0527)   Estimated Nutritional Needs:   Kcal:  Protein:  Fluid:   Admitted with in head, arm and leg. Notable hx of PE, depression, anxiety, polysubstance abuse and alcoholism.  RD drawn to chart 2/2 pt report of recent weight loss and poor appetite. RD obtained nutritional hx from pt. She states that she has been eating less for the past 2-3 months 2/2 stress. She confirms that she has lost weight, approximately 6% of  her weight during this time (3 months.) This wt loss is not significant. Pt states that her intake is poor - one day all she will eat is a sandwich and the next day she will not eat anything. Noted that she has hx of alcoholism; pt with likely low protein intake given ongoing consumption of alcohol and minimal solid food intake. Pt appears thin. Pt meets criteria for moderate malnutrition in the context of social/environmental circumstances as evidenced by <75% of intake x at least 3 months and mild body fat/muscle mass loss.  Pt has yet to receive a meal tray, per her report. She has had a Sprite and applesauce since being admitted however and was able to tolerate well.  Skin: No skin breakdown noted. Notable labs: sodium trending down; glucose low Pertinent meds: folvite, GI cocktail, magnesium sulfate, MVI, zofran, protonix, thiamine Last BM: 10/9  NUTRITION DIAGNOSIS: Inadequate oral intake r/t stress/anxiety AEB pt report and ongoing wt loss.  MONITORING/EVALUATION(Goals): Goal: Advance diet as tolerated to Regular diet. Intake to meet at least 90% of estimated needs. Monitor: weight trends, lab trends, I/O's, diet advancement  EDUCATION NEEDS: -No education needs identified at this time  Jarold Motto MS, RD, LDN Pager: [phone] After-hours pager: [phone]

## 2012-08-08 NOTE — Progress Notes (Signed)
MD requests another lab draw. Also notified MD of pt weigh increase since admission from 53.6 kg to 69.21 kg in 24. Will continue to monitor.
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
## 2012-08-09 DIAGNOSIS — E872 Acidosis: Secondary | ICD-10-CM

## 2012-08-09 LAB — BASIC METABOLIC PANEL
CO2: 17 mEq/L — ABNORMAL LOW (ref 19–32)
CO2: 23 mEq/L (ref 19–32)
Calcium: 7.8 mg/dL — ABNORMAL LOW (ref 8.4–10.5)
Chloride: 105 mEq/L (ref 96–112)
GFR calc non Af Amer: >90 mL/min (ref >90)
Glucose, Bld: 218 mg/dL — ABNORMAL HIGH (ref 70–99)
Potassium: 4.7 mEq/L (ref 3.5–5.1)
Sodium: 138 mEq/L (ref 135–145)
Sodium: 139 mEq/L (ref 135–145)

## 2012-08-09 MED ORDER — INFLUENZA VIRUS VACC SPLIT PF IM SUSP: 0.5000 mL | INTRAMUSCULAR | Status: DC

## 2012-08-09 NOTE — Clinical Social Work Psychosocial (Signed)
     Clinical Social Work Department BRIEF PSYCHOSOCIAL ASSESSMENT 08/09/2012  Patient:  [name], [name]     Account Number:  [id number]     Admit date:  08/07/2012  Clinical Social Worker:  Margaree Mackintosh  Date/Time:  08/09/2012 12:00 M  Referred by:  Physician  Date Referred:  08/09/2012 Referred for  Other - See comment   Other Referral:   housing   Interview type:  Patient Other interview type:    PSYCHOSOCIAL DATA Living Status:  FAMILY Admitted from facility:   Level of care:   Primary support name:  Britta Mccreedy: [phone] Primary support relationship to patient:  PARENT Degree of support available:   Unknown.    CURRENT CONCERNS Current Concerns  Other - See comment   Other Concerns:   Housing.    SOCIAL WORK ASSESSMENT / PLAN Clinical Social Worker recieved referral indicating pt currently expressing concenrs related to housing.  CSW met with pt at bedside.  CSW introduced self, explained role, and provided support.  CSW provided opportunity for pt to process feelings.  Pt requested information on mental health and substance abuse counesling as well as housing resources.  CSW provided requested information.  No other concerns or questions noted at this time by pt.  CSW to sign off at this time, please re consult if needed.   Assessment/plan status:  Information/Referral to [facility] Other assessment/ plan:   Information/referral to community resources:   Housing  MH/Substance Abuse resources.    PATIENTS/FAMILYS RESPONSE TO PLAN OF CARE: Pt was engaged in conversation. Pt thanked CSW for intervention.

## 2012-08-09 NOTE — Progress Notes (Signed)
IV was d/c,pt. Got discharge orders and pt. Is ready to go home.
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
## 2012-09-04 ENCOUNTER — Ambulatory Visit: Payer: Self-pay

## 2012-09-04 ENCOUNTER — Other Ambulatory Visit: Payer: Self-pay | Admitting: Internal Medicine

## 2012-09-04 ENCOUNTER — Ambulatory Visit (INDEPENDENT_AMBULATORY_CARE_PROVIDER_SITE_OTHER): Payer: Self-pay | Admitting: *Deleted

## 2012-09-04 DIAGNOSIS — Z23 Encounter for immunization: Secondary | ICD-10-CM

## 2012-09-04 DIAGNOSIS — E538 Deficiency of other specified B group vitamins: Secondary | ICD-10-CM

## 2012-09-04 DIAGNOSIS — Z299 Encounter for prophylactic measures, unspecified: Secondary | ICD-10-CM

## 2012-09-04 DIAGNOSIS — D518 Other vitamin B12 deficiency anemias: Secondary | ICD-10-CM

## 2012-09-04 MED ORDER — CYANOCOBALAMIN 1000 MCG/ML IJ SOLN: 1000.0000 ug | INTRAMUSCULAR | Status: DC

## 2012-09-04 MED ORDER — CYANOCOBALAMIN 1000 MCG/ML IJ SOLN
1000.0000 ug | Freq: Once | INTRAMUSCULAR | Status: AC
  Administered 2012-09-04: 1000 ug via INTRAMUSCULAR

## 2012-09-23 ENCOUNTER — Encounter: Payer: Self-pay | Admitting: Internal Medicine

## 2012-10-16 ENCOUNTER — Ambulatory Visit (INDEPENDENT_AMBULATORY_CARE_PROVIDER_SITE_OTHER): Payer: No Typology Code available for payment source | Admitting: *Deleted

## 2012-10-16 DIAGNOSIS — D518 Other vitamin B12 deficiency anemias: Secondary | ICD-10-CM

## 2012-10-16 MED ORDER — CYANOCOBALAMIN 1000 MCG/ML IJ SOLN
1000.0000 ug | Freq: Once | INTRAMUSCULAR | Status: AC
  Administered 2012-10-16: 1000 ug via INTRAMUSCULAR

## 2012-11-15 ENCOUNTER — Ambulatory Visit (HOSPITAL_COMMUNITY)
Admission: RE | Admit: 2012-11-15 | Discharge: 2012-11-15 | Disposition: A | Discharge status: Home / Self Care | Payer: No Typology Code available for payment source | Source: Ambulatory Visit | Attending: Internal Medicine | Admitting: Internal Medicine

## 2012-11-15 ENCOUNTER — Ambulatory Visit (INDEPENDENT_AMBULATORY_CARE_PROVIDER_SITE_OTHER): Payer: No Typology Code available for payment source | Admitting: Internal Medicine

## 2012-11-15 ENCOUNTER — Encounter: Payer: Self-pay | Admitting: Internal Medicine

## 2012-11-15 VITALS — BP 134/77 | HR 93 | Temp 97.9°F | Ht 64.0 in | Wt 123.7 lb

## 2012-11-15 DIAGNOSIS — S81811A Laceration without foreign body, right lower leg, initial encounter: Secondary | ICD-10-CM

## 2012-11-15 DIAGNOSIS — D518 Other vitamin B12 deficiency anemias: Secondary | ICD-10-CM

## 2012-11-15 DIAGNOSIS — I499 Cardiac arrhythmia, unspecified: Secondary | ICD-10-CM | POA: Insufficient documentation

## 2012-11-15 DIAGNOSIS — I4460 Unspecified fascicular block: Secondary | ICD-10-CM | POA: Insufficient documentation

## 2012-11-15 DIAGNOSIS — R9431 Abnormal electrocardiogram [ECG] [EKG]: Secondary | ICD-10-CM | POA: Insufficient documentation

## 2012-11-15 DIAGNOSIS — E538 Deficiency of other specified B group vitamins: Secondary | ICD-10-CM

## 2012-11-15 LAB — COMPLETE METABOLIC PANEL WITH GFR
ALT: 103 U/L — ABNORMAL HIGH (ref 0–35)
Albumin: 4.2 g/dL (ref 3.5–5.2)
BUN: 15 mg/dL (ref 6–23)
Calcium: 9.3 mg/dL (ref 8.4–10.5)
Chloride: 107 mEq/L (ref 96–112)
Creat: 0.65 mg/dL (ref 0.50–1.10)
GFR, Est Non African American: >89 mL/min
Glucose, Bld: 69 mg/dL — ABNORMAL LOW (ref 70–99)
Potassium: 4.3 mEq/L (ref 3.5–5.3)

## 2012-11-15 LAB — CBC
Hemoglobin: 10.3 g/dL — ABNORMAL LOW (ref 12.0–15.0)
MCHC: 32.1 g/dL (ref 30.0–36.0)
MCV: 93 fL (ref 78.0–100.0)
Platelets: 272 10*3/uL (ref 150–400)
RDW: 16.9 % — ABNORMAL HIGH (ref 11.5–15.5)
WBC: 5.6 10*3/uL (ref 4.0–10.5)

## 2012-11-15 LAB — PHOSPHORUS: Phosphorus: 3.8 mg/dL (ref 2.3–4.6)

## 2012-11-15 LAB — MAGNESIUM: Magnesium: 2.1 mg/dL (ref 1.5–2.5)

## 2012-11-15 LAB — TSH: TSH: 0.172 u[IU]/mL — ABNORMAL LOW (ref 0.350–4.500)

## 2012-11-15 MED ORDER — BACITRACIN-POLYMYXIN B 500-10000 UNIT/GM EX OINT: TOPICAL_OINTMENT | Freq: Two times a day (BID) | CUTANEOUS | Status: DC

## 2012-11-15 MED ORDER — CYANOCOBALAMIN 1000 MCG/ML IJ SOLN
1000.0000 ug | Freq: Once | INTRAMUSCULAR | Status: AC
  Administered 2012-11-15: 1000 ug via INTRAMUSCULAR

## 2012-11-15 NOTE — Assessment & Plan Note (Signed)
Patient with history of B12 deficiency anemia  -Patient given B12 shot today

## 2012-11-15 NOTE — Assessment & Plan Note (Addendum)
Patient presents with 4 months of intermittent fluttering. She does have some pauses on exam but not consistently irregular heart rate. Likely PVCs or PACs. Other things on differential include anxiety, PE. Wells score indicates low probability based only on her personal history of PE/DVT. EKG from today's visit showed regular rhythm, rate is 75, no S in lead 1 no q in lead 2, she has left bundle branch block, no T-wave inversions.  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 Will followup at next visit and start beta blockers if this is bothersome to patient. We did discuss that beta blockers cannot be used with cocaine, so this may be a consideration as well. Patient states she does not use cocaine currently but she does have a history.

## 2012-11-15 NOTE — Patient Instructions (Addendum)
Please return to clinic in 12 months.
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
## 2012-11-25 ENCOUNTER — Encounter: Payer: Self-pay | Admitting: Internal Medicine

## 2012-12-05 ENCOUNTER — Encounter: Payer: Self-pay | Admitting: Internal Medicine

## 2012-12-05 ENCOUNTER — Ambulatory Visit (INDEPENDENT_AMBULATORY_CARE_PROVIDER_SITE_OTHER): Payer: No Typology Code available for payment source | Admitting: Internal Medicine

## 2012-12-05 ENCOUNTER — Ambulatory Visit (HOSPITAL_COMMUNITY)
Admission: RE | Admit: 2012-12-05 | Discharge: 2012-12-05 | Disposition: A | Discharge status: Home / Self Care | Payer: No Typology Code available for payment source | Source: Ambulatory Visit | Attending: Internal Medicine | Admitting: Internal Medicine

## 2012-12-05 VITALS — BP 144/73 | HR 90 | Temp 98.9°F | Ht 64.0 in | Wt 122.9 lb

## 2012-12-05 DIAGNOSIS — R7989 Other specified abnormal findings of blood chemistry: Secondary | ICD-10-CM | POA: Insufficient documentation

## 2012-12-05 DIAGNOSIS — R079 Chest pain, unspecified: Secondary | ICD-10-CM | POA: Insufficient documentation

## 2012-12-05 DIAGNOSIS — F1910 Other psychoactive substance abuse, uncomplicated: Secondary | ICD-10-CM

## 2012-12-05 DIAGNOSIS — E0590 Thyrotoxicosis, unspecified without thyrotoxic crisis or storm: Secondary | ICD-10-CM

## 2012-12-05 DIAGNOSIS — F1410 Cocaine abuse, uncomplicated: Secondary | ICD-10-CM

## 2012-12-05 DIAGNOSIS — I499 Cardiac arrhythmia, unspecified: Secondary | ICD-10-CM

## 2012-12-05 DIAGNOSIS — D518 Other vitamin B12 deficiency anemias: Secondary | ICD-10-CM

## 2012-12-05 DIAGNOSIS — F419 Anxiety disorder, unspecified: Secondary | ICD-10-CM

## 2012-12-05 DIAGNOSIS — E538 Deficiency of other specified B group vitamins: Secondary | ICD-10-CM

## 2012-12-05 DIAGNOSIS — I10 Essential (primary) hypertension: Secondary | ICD-10-CM

## 2012-12-05 NOTE — Assessment & Plan Note (Signed)
She presents with chest pain that she describes as constant nagging discomfort for last one week. On exam she has chest wall tenderness in the substernal area. She admits using cocaine about 3 days ago. Her EKG shows left bundle branch block with no acute ST-T wave changes and is unchanged/ similar to the EKG obtained on 11/15/2012. Her chest pain is likely musculoskeletal in origin. -Over-the-counter ibuprofen for pain relief. -Counseled to quit cocaine. We'll consult social worker to help with outpatient detox program. -Call the clinic or come to the ER if chest pain gets worse, is associated with shortness of breath or diaphoresis.

## 2012-12-05 NOTE — Assessment & Plan Note (Signed)
Patient was noted to have low TSH with last clinic visit about 2 weeks ago. -Recheck TSH and free T4.

## 2012-12-05 NOTE — Assessment & Plan Note (Signed)
Counseled on harmful effects and cessation. She is willing to enroll into outpatient detox Consult social worker to arrange for outpatient detox.
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
## 2012-12-05 NOTE — Patient Instructions (Addendum)
General Instructions: Please schedule a follow up appointment in 1-2 months . Please bring your medication bottles with your next appointment. Please take your medicines as prescribed. I will call you with your lab results if anything will be abnormal. Your chest pain is likely musuloskeletal in origin or related to cocaine.     Treatment Goals:  Goals (1 Years of Data) as of 12/05/2012          As of Today 11/15/12 08/09/12 08/09/12 08/09/12     Blood Pressure    . Blood Pressure < 140/90  144/73 134/77 117/76 120/68 125/79      Progress Toward Treatment Goals:  Treatment Goal 12/05/2012  Blood pressure deteriorated  Stop smoking smoking the same amount    Self Care Goals & Plans:  Self Care Goal 12/05/2012  Manage my medications bring my medications to every visit  Eat healthy foods eat baked foods instead of fried foods       Care Management & Community Referrals:

## 2012-12-05 NOTE — Assessment & Plan Note (Signed)
BP Readings from Last 3 Encounters:  12/05/12 144/73  11/15/12 134/77  08/09/12 117/76    Lab Results  Component Value Date   NA 145 11/15/2012   K 4.3 11/15/2012   CREATININE 0.65 11/15/2012    Assessment:  Blood pressure control: mildly elevated  Progress toward BP goal:  deteriorated  Comments: BP mildly elevated- could be secondary to cocaine use.  Plan:  Medications:  continue current medications  Educational resources provided: brochure  Self management tools provided:

## 2012-12-05 NOTE — Assessment & Plan Note (Signed)
Recheck Vit B-12 levels. - If her level normalizes, would give her the option to be started on oral vitamin B12 supplementation.

## 2012-12-11 ENCOUNTER — Telehealth: Payer: Self-pay | Admitting: Licensed Clinical Social Worker

## 2012-12-11 NOTE — Telephone Encounter (Signed)
[name] was referred to CSW for referral information on outpatient detox programs.  CSW placed called to pt.  CSW left message requesting return call. CSW provided contact hours and phone number.  Pt is uninsured and can be referred to: [facility], [facility], [facility].

## 2012-12-17 NOTE — Telephone Encounter (Signed)
CSW placed call to pt's number listed on chart.  Phone is ringing busy at this time.

## 2012-12-20 NOTE — Telephone Encounter (Signed)
CSW placed call to pt's number listed on chart. Phone is ringing busy at this time.  CSW will mail information to pt.

## 2013-01-16 ENCOUNTER — Inpatient Hospital Stay (HOSPITAL_COMMUNITY): Payer: Self-pay

## 2013-01-16 ENCOUNTER — Emergency Department (HOSPITAL_COMMUNITY): Payer: Self-pay

## 2013-01-16 ENCOUNTER — Inpatient Hospital Stay (HOSPITAL_COMMUNITY)
Admission: EM | Admit: 2013-01-16 | Discharge: 2013-01-31 | DRG: 286 | Disposition: A | Discharge status: Home Health | Payer: MEDICAID | Attending: Pulmonary Disease | Admitting: Pulmonary Disease

## 2013-01-16 ENCOUNTER — Encounter (HOSPITAL_COMMUNITY): Payer: Self-pay | Admitting: Nurse Practitioner

## 2013-01-16 ENCOUNTER — Ambulatory Visit (HOSPITAL_COMMUNITY): Admit: 2013-01-16 | Payer: Self-pay | Admitting: Cardiovascular Disease

## 2013-01-16 DIAGNOSIS — F1910 Other psychoactive substance abuse, uncomplicated: Secondary | ICD-10-CM

## 2013-01-16 DIAGNOSIS — I9589 Other hypotension: Secondary | ICD-10-CM | POA: Diagnosis not present

## 2013-01-16 DIAGNOSIS — T50995A Adverse effect of other drugs, medicaments and biological substances, initial encounter: Secondary | ICD-10-CM | POA: Diagnosis not present

## 2013-01-16 DIAGNOSIS — I5021 Acute systolic (congestive) heart failure: Secondary | ICD-10-CM

## 2013-01-16 DIAGNOSIS — F1010 Alcohol abuse, uncomplicated: Secondary | ICD-10-CM | POA: Diagnosis present

## 2013-01-16 DIAGNOSIS — Z8249 Family history of ischemic heart disease and other diseases of the circulatory system: Secondary | ICD-10-CM

## 2013-01-16 DIAGNOSIS — T17908A Unspecified foreign body in respiratory tract, part unspecified causing other injury, initial encounter: Secondary | ICD-10-CM

## 2013-01-16 DIAGNOSIS — R739 Hyperglycemia, unspecified: Secondary | ICD-10-CM

## 2013-01-16 DIAGNOSIS — I059 Rheumatic mitral valve disease, unspecified: Secondary | ICD-10-CM

## 2013-01-16 DIAGNOSIS — D518 Other vitamin B12 deficiency anemias: Secondary | ICD-10-CM | POA: Diagnosis present

## 2013-01-16 DIAGNOSIS — D696 Thrombocytopenia, unspecified: Secondary | ICD-10-CM | POA: Diagnosis present

## 2013-01-16 DIAGNOSIS — Z86711 Personal history of pulmonary embolism: Secondary | ICD-10-CM

## 2013-01-16 DIAGNOSIS — I10 Essential (primary) hypertension: Secondary | ICD-10-CM | POA: Diagnosis present

## 2013-01-16 DIAGNOSIS — R4020 Unspecified coma: Secondary | ICD-10-CM | POA: Diagnosis present

## 2013-01-16 DIAGNOSIS — Z8674 Personal history of sudden cardiac arrest: Secondary | ICD-10-CM | POA: Diagnosis present

## 2013-01-16 DIAGNOSIS — Z7982 Long term (current) use of aspirin: Secondary | ICD-10-CM

## 2013-01-16 DIAGNOSIS — Z8673 Personal history of transient ischemic attack (TIA), and cerebral infarction without residual deficits: Secondary | ICD-10-CM

## 2013-01-16 DIAGNOSIS — I428 Other cardiomyopathies: Secondary | ICD-10-CM | POA: Diagnosis present

## 2013-01-16 DIAGNOSIS — F10939 Alcohol use, unspecified with withdrawal, unspecified: Secondary | ICD-10-CM | POA: Diagnosis not present

## 2013-01-16 DIAGNOSIS — D649 Anemia, unspecified: Secondary | ICD-10-CM | POA: Diagnosis present

## 2013-01-16 DIAGNOSIS — J9620 Acute and chronic respiratory failure, unspecified whether with hypoxia or hypercapnia: Secondary | ICD-10-CM | POA: Diagnosis present

## 2013-01-16 DIAGNOSIS — J811 Chronic pulmonary edema: Secondary | ICD-10-CM | POA: Diagnosis present

## 2013-01-16 DIAGNOSIS — I447 Left bundle-branch block, unspecified: Secondary | ICD-10-CM

## 2013-01-16 DIAGNOSIS — J449 Chronic obstructive pulmonary disease, unspecified: Secondary | ICD-10-CM | POA: Diagnosis present

## 2013-01-16 DIAGNOSIS — G931 Anoxic brain damage, not elsewhere classified: Secondary | ICD-10-CM | POA: Diagnosis present

## 2013-01-16 DIAGNOSIS — B338 Other specified viral diseases: Secondary | ICD-10-CM | POA: Diagnosis present

## 2013-01-16 DIAGNOSIS — I469 Cardiac arrest, cause unspecified: Secondary | ICD-10-CM | POA: Diagnosis present

## 2013-01-16 DIAGNOSIS — R7309 Other abnormal glucose: Secondary | ICD-10-CM | POA: Diagnosis present

## 2013-01-16 DIAGNOSIS — F1410 Cocaine abuse, uncomplicated: Secondary | ICD-10-CM | POA: Diagnosis present

## 2013-01-16 DIAGNOSIS — I429 Cardiomyopathy, unspecified: Secondary | ICD-10-CM

## 2013-01-16 DIAGNOSIS — D72829 Elevated white blood cell count, unspecified: Secondary | ICD-10-CM | POA: Diagnosis not present

## 2013-01-16 DIAGNOSIS — E876 Hypokalemia: Secondary | ICD-10-CM | POA: Diagnosis present

## 2013-01-16 DIAGNOSIS — X58XXXA Exposure to other specified factors, initial encounter: Secondary | ICD-10-CM | POA: Diagnosis not present

## 2013-01-16 DIAGNOSIS — E870 Hyperosmolality and hypernatremia: Secondary | ICD-10-CM | POA: Diagnosis not present

## 2013-01-16 DIAGNOSIS — R1310 Dysphagia, unspecified: Secondary | ICD-10-CM | POA: Diagnosis not present

## 2013-01-16 DIAGNOSIS — F10239 Alcohol dependence with withdrawal, unspecified: Secondary | ICD-10-CM | POA: Diagnosis not present

## 2013-01-16 DIAGNOSIS — R5381 Other malaise: Secondary | ICD-10-CM | POA: Diagnosis not present

## 2013-01-16 DIAGNOSIS — J9600 Acute respiratory failure, unspecified whether with hypoxia or hypercapnia: Secondary | ICD-10-CM

## 2013-01-16 DIAGNOSIS — J690 Pneumonitis due to inhalation of food and vomit: Secondary | ICD-10-CM | POA: Diagnosis present

## 2013-01-16 DIAGNOSIS — Z79899 Other long term (current) drug therapy: Secondary | ICD-10-CM

## 2013-01-16 DIAGNOSIS — Z86718 Personal history of other venous thrombosis and embolism: Secondary | ICD-10-CM

## 2013-01-16 DIAGNOSIS — I509 Heart failure, unspecified: Secondary | ICD-10-CM | POA: Diagnosis present

## 2013-01-16 DIAGNOSIS — I4901 Ventricular fibrillation: Principal | ICD-10-CM | POA: Diagnosis present

## 2013-01-16 DIAGNOSIS — I5043 Acute on chronic combined systolic (congestive) and diastolic (congestive) heart failure: Secondary | ICD-10-CM | POA: Diagnosis present

## 2013-01-16 DIAGNOSIS — T17908D Unspecified foreign body in respiratory tract, part unspecified causing other injury, subsequent encounter: Secondary | ICD-10-CM

## 2013-01-16 DIAGNOSIS — J4489 Other specified chronic obstructive pulmonary disease: Secondary | ICD-10-CM | POA: Diagnosis present

## 2013-01-16 DIAGNOSIS — F17200 Nicotine dependence, unspecified, uncomplicated: Secondary | ICD-10-CM | POA: Diagnosis present

## 2013-01-16 HISTORY — DX: Tobacco use: Z72.0

## 2013-01-16 HISTORY — DX: Unspecified convulsions: R56.9

## 2013-01-16 HISTORY — DX: Other pulmonary embolism without acute cor pulmonale: I26.99

## 2013-01-16 HISTORY — DX: Cerebral infarction, unspecified: I63.9

## 2013-01-16 HISTORY — DX: Duodenitis without bleeding: K29.80

## 2013-01-16 HISTORY — DX: Cardiac arrest, cause unspecified: I46.9

## 2013-01-16 HISTORY — DX: Acute embolism and thrombosis of unspecified deep veins of unspecified lower extremity: I82.409

## 2013-01-16 HISTORY — DX: Chest pain, unspecified: R07.9

## 2013-01-16 HISTORY — DX: Alcohol abuse, uncomplicated: F10.10

## 2013-01-16 HISTORY — DX: Personal history of transient ischemic attack (TIA), and cerebral infarction without residual deficits: Z86.73

## 2013-01-16 HISTORY — DX: Cardiac murmur, unspecified: R01.1

## 2013-01-16 HISTORY — DX: Essential (primary) hypertension: I10

## 2013-01-16 LAB — COMPREHENSIVE METABOLIC PANEL
ALT: 213 U/L — ABNORMAL HIGH (ref 0–35)
CO2: 17 mEq/L — ABNORMAL LOW (ref 19–32)
Calcium: 8.9 mg/dL (ref 8.4–10.5)
Creatinine, Ser: 0.75 mg/dL (ref 0.50–1.10)
GFR calc Af Amer: >90 mL/min (ref >90)
GFR calc non Af Amer: >90 mL/min (ref >90)
Glucose, Bld: 310 mg/dL — ABNORMAL HIGH (ref 70–99)
Sodium: 142 mEq/L (ref 135–145)
Total Protein: 7.1 g/dL (ref 6.0–8.3)

## 2013-01-16 LAB — POCT I-STAT 3, ART BLOOD GAS (G3+)
Acid-base deficit: 9 mmol/L — ABNORMAL HIGH (ref 0.0–2.0)
Acid-base deficit: 5 mmol/L — ABNORMAL HIGH (ref 0.0–2.0)
O2 Saturation: 79 %
O2 Saturation: 99 %
Patient temperature: 34.5
pO2, Arterial: 133 mmHg — ABNORMAL HIGH (ref 80.0–100.0)

## 2013-01-16 LAB — CBC WITH DIFFERENTIAL/PLATELET
Basophils Relative: 0 % (ref 0–1)
Eosinophils Absolute: 0.2 10*3/uL (ref 0.0–0.7)
Lymphs Abs: 9 10*3/uL — ABNORMAL HIGH (ref 0.7–4.0)
MCH: 30.6 pg (ref 26.0–34.0)
MCHC: 32.9 g/dL (ref 30.0–36.0)
MCV: 92.8 fL (ref 78.0–100.0)
Monocytes Absolute: 0.8 10*3/uL (ref 0.1–1.0)
Neutrophils Relative %: 34 % — ABNORMAL LOW (ref 43–77)
Platelets: 145 10*3/uL — ABNORMAL LOW (ref 150–400)

## 2013-01-16 LAB — POCT I-STAT, CHEM 8
BUN: 12 mg/dL (ref 6–23)
Calcium, Ion: 1.09 mmol/L — ABNORMAL LOW (ref 1.12–1.23)
Calcium, Ion: 1.15 mmol/L (ref 1.12–1.23)
Creatinine, Ser: 0.5 mg/dL (ref 0.50–1.10)
Creatinine, Ser: 0.5 mg/dL (ref 0.50–1.10)
Creatinine, Ser: 0.5 mg/dL (ref 0.50–1.10)
Glucose, Bld: 207 mg/dL — ABNORMAL HIGH (ref 70–99)
Glucose, Bld: 188 mg/dL — ABNORMAL HIGH (ref 70–99)
HCT: 37 % (ref 36.0–46.0)
HCT: 39 % (ref 36.0–46.0)
Hemoglobin: 12.6 g/dL (ref 12.0–15.0)
Hemoglobin: 13.3 g/dL (ref 12.0–15.0)
Hemoglobin: 12.6 g/dL (ref 12.0–15.0)
Potassium: 3.3 mEq/L — ABNORMAL LOW (ref 3.5–5.1)
Potassium: 3.4 mEq/L — ABNORMAL LOW (ref 3.5–5.1)
Sodium: 146 mEq/L — ABNORMAL HIGH (ref 135–145)
TCO2: 25 mmol/L (ref 0–100)

## 2013-01-16 LAB — APTT
aPTT: 42 seconds — ABNORMAL HIGH (ref 24–37)
aPTT: 31 seconds (ref 24–37)

## 2013-01-16 LAB — RAPID URINE DRUG SCREEN, HOSP PERFORMED
Amphetamines: NOT DETECTED
Cocaine: NOT DETECTED
Opiates: NOT DETECTED
Tetrahydrocannabinol: NOT DETECTED

## 2013-01-16 LAB — URINALYSIS, ROUTINE W REFLEX MICROSCOPIC
Glucose, UA: 500 mg/dL — AB
Leukocytes, UA: NEGATIVE
pH: 7 (ref 5.0–8.0)

## 2013-01-16 LAB — BASIC METABOLIC PANEL
BUN: 13 mg/dL (ref 6–23)
CO2: 22 mEq/L (ref 19–32)
CO2: 22 mEq/L (ref 19–32)
Calcium: 8.2 mg/dL — ABNORMAL LOW (ref 8.4–10.5)
Chloride: 106 mEq/L (ref 96–112)
Creatinine, Ser: 0.63 mg/dL (ref 0.50–1.10)
Creatinine, Ser: 0.52 mg/dL (ref 0.50–1.10)
Glucose, Bld: 166 mg/dL — ABNORMAL HIGH (ref 70–99)

## 2013-01-16 LAB — MRSA PCR SCREENING: MRSA by PCR: NEGATIVE

## 2013-01-16 LAB — GLUCOSE, CAPILLARY
Glucose-Capillary: 164 mg/dL — ABNORMAL HIGH (ref 70–99)
Glucose-Capillary: 186 mg/dL — ABNORMAL HIGH (ref 70–99)
Glucose-Capillary: 152 mg/dL — ABNORMAL HIGH (ref 70–99)

## 2013-01-16 LAB — URINE MICROSCOPIC-ADD ON

## 2013-01-16 SURGERY — LEFT HEART CATHETERIZATION WITH CORONARY ANGIOGRAM
Anesthesia: LOCAL

## 2013-01-16 MED ORDER — INSULIN ASPART 100 UNIT/ML ~~LOC~~ SOLN
0.0000 [IU] | SUBCUTANEOUS | Status: DC
  Administered 2013-01-16: 3 [IU] via SUBCUTANEOUS

## 2013-01-16 MED ORDER — LIDOCAINE HCL (CARDIAC) 20 MG/ML IV SOLN
INTRAVENOUS | Status: AC
  Filled 2013-01-16: qty 5

## 2013-01-16 MED ORDER — SODIUM BICARBONATE 8.4 % IV SOLN
INTRAVENOUS | Status: AC
  Filled 2013-01-16: qty 50

## 2013-01-16 MED ORDER — CISATRACURIUM BESYLATE 10 MG/ML IV SOLN
1.0000 ug/kg/min | INTRAVENOUS | Status: DC
  Administered 2013-01-16 – 2013-01-18 (×2): 1.5 ug/kg/min via INTRAVENOUS
  Filled 2013-01-16 (×2): qty 20

## 2013-01-16 MED ORDER — FENTANYL CITRATE 0.05 MG/ML IJ SOLN: 100.0000 ug | Freq: Once | INTRAMUSCULAR | Status: AC | PRN

## 2013-01-16 MED ORDER — HEPARIN SODIUM (PORCINE) 5000 UNIT/ML IJ SOLN
5000.0000 [IU] | Freq: Three times a day (TID) | INTRAMUSCULAR | Status: DC
  Administered 2013-01-16 – 2013-01-31 (×45): 5000 [IU] via SUBCUTANEOUS
  Filled 2013-01-16 (×49): qty 1

## 2013-01-16 MED ORDER — NOREPINEPHRINE BITARTRATE 1 MG/ML IJ SOLN
0.0000 ug/min | INTRAVENOUS | Status: DC
  Administered 2013-01-17 (×2): 3 ug/min via INTRAVENOUS
  Administered 2013-01-18: 8 ug/min via INTRAVENOUS
  Filled 2013-01-16 (×4): qty 4

## 2013-01-16 MED ORDER — POTASSIUM CHLORIDE 10 MEQ/50ML IV SOLN
10.0000 meq | INTRAVENOUS | Status: AC
  Administered 2013-01-16 (×4): 10 meq via INTRAVENOUS
  Filled 2013-01-16 (×4): qty 50

## 2013-01-16 MED ORDER — FENTANYL BOLUS VIA INFUSION
50.0000 ug | INTRAVENOUS | Status: DC | PRN
  Filled 2013-01-16: qty 50

## 2013-01-16 MED ORDER — HEPARIN (PORCINE) IN NACL 2-0.9 UNIT/ML-% IJ SOLN
INTRAMUSCULAR | Status: AC
  Filled 2013-01-16: qty 1000

## 2013-01-16 MED ORDER — SODIUM CHLORIDE 0.9 % IV SOLN: 1.0000 ug/kg/min | INTRAVENOUS | Status: DC

## 2013-01-16 MED ORDER — SUCCINYLCHOLINE CHLORIDE 20 MG/ML IJ SOLN
INTRAMUSCULAR | Status: AC
  Filled 2013-01-16: qty 1

## 2013-01-16 MED ORDER — CISATRACURIUM BOLUS VIA INFUSION
0.0500 mg/kg | Freq: Once | INTRAVENOUS | Status: DC | PRN
  Filled 2013-01-16: qty 3

## 2013-01-16 MED ORDER — CHLORHEXIDINE GLUCONATE 0.12 % MT SOLN
15.0000 mL | Freq: Two times a day (BID) | OROMUCOSAL | Status: DC
  Administered 2013-01-16 – 2013-01-28 (×23): 15 mL via OROMUCOSAL
  Filled 2013-01-16 (×23): qty 15

## 2013-01-16 MED ORDER — ROCURONIUM BROMIDE 50 MG/5ML IV SOLN
INTRAVENOUS | Status: AC
  Administered 2013-01-16: 50 mg
  Filled 2013-01-16: qty 2

## 2013-01-16 MED ORDER — SODIUM CHLORIDE 0.9 % IV SOLN
1.5000 g | Freq: Three times a day (TID) | INTRAVENOUS | Status: DC
  Administered 2013-01-16 – 2013-01-23 (×21): 1.5 g via INTRAVENOUS
  Filled 2013-01-16 (×24): qty 1.5

## 2013-01-16 MED ORDER — SODIUM CHLORIDE 0.9 % IV SOLN
1.0000 mg/h | INTRAVENOUS | Status: DC
  Administered 2013-01-16: 2 mg/h via INTRAVENOUS
  Administered 2013-01-16 (×2): 4 mg/h via INTRAVENOUS
  Filled 2013-01-16 (×2): qty 10

## 2013-01-16 MED ORDER — CISATRACURIUM BOLUS VIA INFUSION
0.0500 mg/kg | Freq: Once | INTRAVENOUS | Status: AC | PRN
  Filled 2013-01-16: qty 3

## 2013-01-16 MED ORDER — SODIUM CHLORIDE 0.9 % IV SOLN
1.0000 ug/kg/min | INTRAVENOUS | Status: DC
  Filled 2013-01-16: qty 20

## 2013-01-16 MED ORDER — SODIUM CHLORIDE 0.9 % IV SOLN
2000.0000 mL | Freq: Once | INTRAVENOUS | Status: AC
  Administered 2013-01-16: 1000 mL via INTRAVENOUS

## 2013-01-16 MED ORDER — PANTOPRAZOLE SODIUM 40 MG IV SOLR
40.0000 mg | INTRAVENOUS | Status: DC
  Administered 2013-01-16 – 2013-01-18 (×3): 40 mg via INTRAVENOUS
  Filled 2013-01-16 (×4): qty 40

## 2013-01-16 MED ORDER — BIOTENE DRY MOUTH MT LIQD
15.0000 mL | Freq: Four times a day (QID) | OROMUCOSAL | Status: DC
  Administered 2013-01-17 – 2013-01-28 (×47): 15 mL via OROMUCOSAL

## 2013-01-16 MED ORDER — SODIUM CHLORIDE 0.9 % IV SOLN
1.0000 ug/kg/min | INTRAVENOUS | Status: DC
  Administered 2013-01-17: 1.5 ug/kg/min via INTRAVENOUS
  Filled 2013-01-16: qty 20

## 2013-01-16 MED ORDER — POTASSIUM CHLORIDE 10 MEQ/100ML IV SOLN
INTRAVENOUS | Status: AC
  Filled 2013-01-16: qty 100

## 2013-01-16 MED ORDER — MIDAZOLAM HCL 5 MG/ML IJ SOLN: 2.0000 mg | Freq: Once | INTRAMUSCULAR | Status: AC | PRN

## 2013-01-16 MED ORDER — SODIUM CHLORIDE 0.9 % IV SOLN
25.0000 ug/h | INTRAVENOUS | Status: DC
  Administered 2013-01-17 – 2013-01-18 (×3): 300 ug/h via INTRAVENOUS
  Filled 2013-01-16 (×4): qty 50

## 2013-01-16 MED ORDER — ETOMIDATE 2 MG/ML IV SOLN
INTRAVENOUS | Status: AC
  Administered 2013-01-16: 20 mg
  Filled 2013-01-16: qty 20

## 2013-01-16 MED ORDER — CISATRACURIUM BOLUS VIA INFUSION
0.1000 mg/kg | Freq: Once | INTRAVENOUS | Status: DC
  Filled 2013-01-16: qty 6

## 2013-01-16 MED ORDER — ACETAMINOPHEN 325 MG PO TABS
650.0000 mg | ORAL_TABLET | ORAL | Status: DC | PRN
  Administered 2013-01-21: 650 mg via ORAL
  Filled 2013-01-16: qty 2

## 2013-01-16 MED ORDER — SODIUM CHLORIDE 0.9 % IV SOLN
INTRAVENOUS | Status: DC
  Administered 2013-01-16: 1.6 [IU]/h via INTRAVENOUS
  Filled 2013-01-16: qty 1

## 2013-01-16 MED ORDER — LIDOCAINE HCL (PF) 1 % IJ SOLN
INTRAMUSCULAR | Status: AC
  Filled 2013-01-16: qty 30

## 2013-01-16 MED ORDER — SODIUM CHLORIDE 0.9 % IV SOLN: 2000.0000 mL | Freq: Once | INTRAVENOUS | Status: DC

## 2013-01-16 MED ORDER — METHYLPREDNISOLONE SODIUM SUCC 125 MG IJ SOLR
INTRAMUSCULAR | Status: AC
  Filled 2013-01-16: qty 2

## 2013-01-16 MED ORDER — MIDAZOLAM HCL 5 MG/ML IJ SOLN: 2.0000 mg | Freq: Once | INTRAMUSCULAR | Status: DC

## 2013-01-16 MED ORDER — NOREPINEPHRINE BITARTRATE 1 MG/ML IJ SOLN
0.5000 ug/min | INTRAVENOUS | Status: DC
  Administered 2013-01-16: 5 ug/min via INTRAVENOUS
  Filled 2013-01-16: qty 4

## 2013-01-16 MED ORDER — SODIUM CHLORIDE 0.9 % IV SOLN
25.0000 ug/h | INTRAVENOUS | Status: DC
  Administered 2013-01-16: 75 ug/h via INTRAVENOUS
  Administered 2013-01-16 (×2): 300 ug/h via INTRAVENOUS
  Filled 2013-01-16 (×2): qty 50

## 2013-01-16 MED ORDER — SODIUM CHLORIDE 0.9 % IV SOLN
1.0000 mg/h | INTRAVENOUS | Status: DC
  Administered 2013-01-17: 8 mg/h via INTRAVENOUS
  Administered 2013-01-17 (×2): 4 mg/h via INTRAVENOUS
  Administered 2013-01-18: 8 mg/h via INTRAVENOUS
  Filled 2013-01-16 (×4): qty 10

## 2013-01-16 MED ORDER — ARTIFICIAL TEARS OP OINT
1.0000 "application " | TOPICAL_OINTMENT | Freq: Three times a day (TID) | OPHTHALMIC | Status: DC
  Administered 2013-01-16 – 2013-01-17 (×5): 1 via OPHTHALMIC
  Filled 2013-01-16 (×2): qty 3.5

## 2013-01-16 MED ORDER — MIDAZOLAM BOLUS VIA INFUSION
2.0000 mg | INTRAVENOUS | Status: DC | PRN
  Filled 2013-01-16: qty 2

## 2013-01-16 MED ORDER — ONDANSETRON HCL 4 MG/2ML IJ SOLN
4.0000 mg | Freq: Four times a day (QID) | INTRAMUSCULAR | Status: DC | PRN
  Administered 2013-01-21 – 2013-01-22 (×4): 4 mg via INTRAVENOUS
  Filled 2013-01-16 (×4): qty 2

## 2013-01-16 MED ORDER — FENTANYL CITRATE 0.05 MG/ML IJ SOLN: 100.0000 ug | Freq: Once | INTRAMUSCULAR | Status: DC

## 2013-01-16 MED ORDER — ASPIRIN 300 MG RE SUPP
300.0000 mg | RECTAL | Status: AC
  Administered 2013-01-16: 300 mg via RECTAL
  Filled 2013-01-16: qty 1

## 2013-01-16 NOTE — ED Notes (Signed)
RT at the bedside doing arterial line.

## 2013-01-16 NOTE — ED Notes (Addendum)
Pt. Was an witnessed arrest by her daughter , collapsed in the living room.   Upon arrival pt. Is in NSR of 82, unresponsive, king air way patent.  Pt. Vomiting yellow contents, being suctioned at the bedside.  Dr. Preston Fleeting and Dr. Kate Sable at bedside.

## 2013-01-16 NOTE — ED Provider Notes (Signed)
History     CSN: [id number]  Arrival date & time 01/16/13  1108   First MD Initiated Contact with Patient 01/16/13 1117      Chief Complaint  Patient presents with  . Cardiac Arrest    (Consider location/radiation/quality/duration/timing/severity/associated sxs/prior treatment) HPI Comments: 52 yo female presenting s/p CPR with ROSC.  History provided by EMS.  They report witnessed arrest without bystander CPR, immediate CPR by EMS with V fib on monitor.  They delivered shocks x 4.  Also gave Epinephrine and Amiodarone.  Report approximately 10 minutes of CPR.  Remainder of history limited by acuity and pt unresponsiveness.   No past medical history on file.  No past surgical history on file.  No family history on file.  History  Substance Use Topics  . Smoking status: Not on file  . Smokeless tobacco: Not on file  . Alcohol Use: Not on file    OB History   No data available      Review of Systems  Unable to perform ROS: Patient unresponsive    Allergies  Review of patient's allergies indicates not on file.  Home Medications  No current outpatient prescriptions on file.  BP 113/83  Pulse 122  Resp 16  SpO2 88%  Physical Exam  Nursing note and vitals reviewed. Constitutional: She appears well-developed and well-nourished.  HENT:  Head: Normocephalic and atraumatic.  King airway in place  Neck: Neck supple.  Cardiovascular: Normal rate, regular rhythm, normal heart sounds and intact distal pulses.   Pulmonary/Chest: She has wheezes. She has rales.  Mechanically ventilated  Abdominal: She exhibits distension. There is no tenderness.  Musculoskeletal: Normal range of motion.  IO in left tibia  Neurological: She is unresponsive. GCS eye subscore is 1. GCS verbal subscore is 1. GCS motor subscore is 1.  Skin: Skin is warm. No rash noted. She is diaphoretic.  Psychiatric: She has a normal mood and affect. Her behavior is normal.    ED Course   INTUBATION Date/Time: 01/16/2013 11:46 AM Performed by: Rennis Petty Authorized by: Preston Fleeting, DAVID Consent: The procedure was performed in an emergent situation. Indications: airway protection, respiratory failure and hypoxemia Intubation method: video-assisted Patient status: paralyzed (RSI) Preoxygenation: King Airway. Sedatives: etomidate Paralytic: rocuronium Laryngoscope size: glidescope 3. Tube size: 7.5 mm Tube type: cuffed Number of attempts: 1 Cords visualized: yes Post-procedure assessment: CO2 detector and chest rise Breath sounds: equal Cuff inflated: yes ETT to lip: 23 cm Tube secured with: ETT holder Patient tolerance: Patient tolerated the procedure well with no immediate complications.   (including critical care time)  Labs Reviewed  URINALYSIS, ROUTINE W REFLEX MICROSCOPIC - Abnormal; Notable for the following:    Glucose, UA 500 (*)    Hgb urine dipstick SMALL (*)    Protein, ur 30 (*)    All other components within normal limits  URINE RAPID DRUG SCREEN (HOSP PERFORMED) - Abnormal; Notable for the following:    Benzodiazepines POSITIVE (*)    All other components within normal limits  CBC WITH DIFFERENTIAL - Abnormal; Notable for the following:    WBC 15.1 (*)    RBC 3.73 (*)    Hemoglobin 11.4 (*)    HCT 34.6 (*)    Platelets 145 (*)    Neutrophils Relative 34 (*)    Lymphocytes Relative 60 (*)    Lymphs Abs 9.0 (*)    All other components within normal limits  COMPREHENSIVE METABOLIC PANEL - Abnormal; Notable for the following:  Potassium 2.7 (*)    CO2 17 (*)    Glucose, Bld 310 (*)    Albumin 3.3 (*)    AST 361 (*)    ALT 213 (*)    Total Bilirubin 0.2 (*)    All other components within normal limits  BASIC METABOLIC PANEL - Abnormal; Notable for the following:    Glucose, Bld 292 (*)    Calcium 7.7 (*)    All other components within normal limits  APTT - Abnormal; Notable for the following:    aPTT 42 (*)    All other  components within normal limits  POCT I-STAT, CHEM 8 - Abnormal; Notable for the following:    Potassium 6.3 (*)    Chloride 114 (*)    Glucose, Bld 212 (*)    Calcium, Ion 1.11 (*)    All other components within normal limits  POCT I-STAT, CHEM 8 - Abnormal; Notable for the following:    Sodium 146 (*)    Potassium 3.3 (*)    Chloride 113 (*)    Glucose, Bld 207 (*)    Calcium, Ion 1.09 (*)    All other components within normal limits  POCT I-STAT 3, BLOOD GAS (G3+) - Abnormal; Notable for the following:    pO2, Arterial 133.0 (*)    Acid-base deficit 5.0 (*)    All other components within normal limits  MRSA PCR SCREENING  CULTURE, RESPIRATORY (NON-EXPECTORATED)  PROTIME-INR  PREGNANCY, URINE  URINE MICROSCOPIC-ADD ON  BLOOD GAS, ARTERIAL  BASIC METABOLIC PANEL  BASIC METABOLIC PANEL  BASIC METABOLIC PANEL  BASIC METABOLIC PANEL  PROTIME-INR  APTT  BASIC METABOLIC PANEL  BLOOD GAS, ARTERIAL  BASIC METABOLIC PANEL   Dg Chest Port 1 View  01/16/2013  **ADDENDUM** CREATED: 01/16/2013 14:23:00  This has been made a PRA call report utilizing dashboard call feature.  **END ADDENDUM** SIGNED BY: Almedia Balls. Constance Goltz, M.D.   01/16/2013  *RADIOLOGY REPORT*  Clinical Data: Line placement.  PORTABLE CHEST - 1 VIEW  Comparison: None.  Findings: Left central line tip projects at the level of the distal superior vena cava.  Endotracheal tube tip 4 cm above the carina.  Nasogastric tube courses below the diaphragm.  The tip is not included on this exam.  No gross pneumothorax.  Heart slightly enlarged.  Diffuse air space disease greater on the right suggestive of pulmonary edema.  Infectious infiltrate not excluded.  Calcified aorta consistent with age advanced atherosclerotic type changes.  IMPRESSION: Left central line tip projects at the level of the distal superior vena cava.  Endotracheal tube tip 4 cm above the carina.  Nasogastric tube courses below the diaphragm.  The tip is not included on  this exam.  Heart slightly enlarged.  Diffuse air space disease greater on the right suggestive of pulmonary edema.  Infectious infiltrate not excluded.  This is a call report.   Original Report Authenticated By: Lacy Duverney, M.D.      Date: 01/16/2013  Rate: 121  Rhythm: sinus tachycardia  QRS Axis: normal  Intervals: QT prolonged  ST/T Wave abnormalities: nonspecific ST/T changes  Conduction Disutrbances:left bundle branch block  Narrative Interpretation:   Old EKG Reviewed: none available    1. Cardiac arrest   2. Acute respiratory failure with hypoxia   3. Anoxic encephalopathy   4. Polysubstance abuse   5. Pulmonary edema   6. Ventricular fibrillation   7. Aspiration into respiratory tract, initial encounter   8.  Left Bundle Branch  Block    MDM  S/p v fib arrest.  Cooling started by EMS and continued in ED.  GCS 3.  King airway in place on arrival.  Pt vomited and likely aspirated stomach contents before ET tube was able to be placed.  ET subsequently placed successfully.  Critical care consulted.  EKG showed LBBB of unknown chronicity, so cardiology also consulted.  They took pt to cath lab.          Rennis Petty, MD 01/16/13 [phone]

## 2013-01-16 NOTE — CV Procedure (Signed)
   Cardiac Catheterization Operative Report  [name] [id number] 3/20/201412:59 PM SAWHNEY,MEGHA, MD  Procedure Performed:  1. Left Heart Catheterization 2. Selective Coronary Angiography 3. Left ventricular pressures  Operator: Verne Carrow, MD  Indication: 52 yo AAF with history of DVT/PE, drug abuse, etoh abuse, CVA who presented via EMS to [facility] ED today after cardiopulmonary arrest at home. Her arrest was witnessed by her daughter who reports possible seizure activity. Pt with LBBB on EKG. Plans for emergent cardiac cath to exclude acute coronary occlusion as etiology of her cardiac arrest.                               Procedure Details: Emergent consent obtained. The patient was brought to the cath lab from the ED intubated on a Versed and Fentanyl drip, completely sedated on non-responsive. The right groin was prepped and draped in the usual manner. Using the modified Seldinger access technique, a 5 French sheath was placed in the right femoral artery. Standard diagnostic catheters were used to perform selective coronary angiography. A pigtail catheter was used to measure left ventricular pressures.  There were no immediate complications. The patient was taken to the recovery area in stable condition.   Hemodynamic Findings: Central aortic pressure: 169/99 Left ventricular pressure: 162/16/25  Angiographic Findings:  Left main: No obstructive disease noted.   Left Anterior Descending Artery: Large caliber vessel that courses to the apex. There are several moderate caliber diagonal branches.   Circumflex Artery: Large caliber vessel with two moderate caliber marginal branches. No obstructive disease noted.   Right Coronary Artery: Moderate caliber, dominant vessel with no obstructive disease noted.   Left Ventricular Angiogram: Deferred.  Impression: 1. No angiographic evidence of CAD 2. S/p Cardiac arrest  Recommendations: She will be admitted to the  PCCM team in the CCU with plans for [facility] cooling protocol.        Complications:  None. The patient tolerated the procedure well.

## 2013-01-16 NOTE — ED Notes (Signed)
Pt.s rythmn is A-fib 97.

## 2013-01-16 NOTE — ED Notes (Signed)
Cardiology at the bedside.

## 2013-01-16 NOTE — Progress Notes (Signed)
DR Molli Knock notified that patient has not reached goal temp of 33 degrees celsius within 2 hours. will continue to monitor.

## 2013-01-16 NOTE — Procedures (Signed)
PROCEDURE NOTE: CVL PLACEMENT  INDICATION:    Monitoring of central venous pressures and/or administration of medications optimally administered in central vein  CONSENT:   Risks of procedure as well as the alternatives were explained to the patient or surrogate. Consent for procedure obtained. A time out was performed to review patient identification, procedure to be performed, correct patient position, medications/allergies/relevent history, required imaging and test results.  PROCEDURE  Maximum sterile technique was used including antiseptics, cap, gloves, gown, hand hygiene, mask and sheet.  Skin prep: Chlorhexidine; local anesthetic administered  A triple lumen catheter was placed in the L [place] vein using the Seldinger technique.  Ultrasound was used for vessel identification and guidance.   EVALUATION:  Blood flow good  Complications: No apparent complications  Patient tolerated the procedure well.  Chest X-ray ordered to verify placement and is pending   Billy Fischer, MD PCCM service Mobile [phone]

## 2013-01-16 NOTE — Consult Note (Signed)
**PATIENT HAS 2 MEDICAL RECORD NUMBERS**    **[id number] & [id number]**  [name] MRN: [id number], DOB/AGE: [birth year]   Admit date: 01/16/2013   Primary Physician: No primary provider on file. Primary Cardiologist: new to [facility], seen by Dr. Shirlee Latch  Pt. Profile:  52 y/o female with reported h/o drug and alcohol abuse, who suffered a witnessed cardiac arrest this AM and was taken to the ED.  Problem List  Past Medical History  Diagnosis Date  . Crack cocaine use   . ETOH abuse   . Anxiety   . Depression   . Anemia   . Pulmonary embolism     a. 04/2009 - treated with coumadin x 8 mos.  . DVT (deep venous thrombosis)     a. 04/2009 - treated with coumadin x 8 mos.  . Rectal bleeding     a. int hemorrhoids by colonoscopy in 04/2006, Bx neg for IBD  . Duodenitis     a. 05/2006 2/2 H pylori +/- NSAIDS  . History of CVA (cerebrovascular accident)   . Tobacco abuse   . Chest pain     a. 04/2009 Echo: EF 55-60%, Gr1 DD, Mild MR.    No past surgical history on file.   Allergies  contrast  HPI  52 y/o female with the above problem list.  She has h/o of LBBB dating back to 2010 at which time pt was Dx with PE.  She has no known prior cardiac history but was seen by her PCP in February 2/2 chest pain in the setting of crack cocaine abuse 3 days prior.  Pt is currently unresponsive, intubated, sedated and family is not readily available to obtain history.  Per EMS staff, pt apparently suffered a witnessed cardiac arrest @ home.  Family apparently called EMS and upon arrival, she was found to have a shockable rhythm and received defibrillation x 4 with conversion to sinus tach and LBBB.  A king airway was placed in the field and she was taken to the [facility] ED where she was intubated and sedated.  Central line was placed and cardiology was called to evaluate 2/2 out of hospital cardiac arrest.  Bedside echo showed global LV dysfxn (review records from other med  rec # shows that she had an echo in 2010 with nl LV fxn).  In setting of arrest and newly discovered wall motion abnormalities, pt was taken to the cath lab for urgent cath.  Home Medications  Prior to Admission medications   Not on File    Family History - from old records.  Family History  Problem Relation Age of Onset  . Breast cancer    . Diabetes Mother   . CAD Mother   . Colon cancer Father   . CAD Father   . Breast cancer Sister   . Diabetes Sister   . Diabetes Brother   . CAD Brother     Social History - from old records.  History   Social History  . Marital Status: N/A    Spouse Name: N/A    Number of Children: N/A  . Years of Education: N/A   Occupational History  . Not on file.   Social History Main Topics  . Smoking status: Current Every Day Smoker  . Smokeless tobacco: Not on file     Comment: smokes about 4-5 cigs/day  . Alcohol Use: Not on file     Comment: beer and liquor  . Drug  Use: Not on file  . Sexually Active: Not on file   Other Topics Concern  . Not on file   Social History Narrative   Pt lives in [place] with a roommate.          Review of Systems Unable to obtain 2/2 acuity.  Physical Exam  Blood pressure 171/105, pulse 109, resp. rate 17, height 5\' 4"  (1.626 m), weight 122 lb (55.339 kg), SpO2 90.00%.  General: intubated, sedated. Psych: intubated, sedated Neuro: intubated, sedated HEENT: Normal  Neck: Supple without bruits or JVD. Lungs:  Resp regular and unlabored, CTA. Heart: RRR - tachy, soft syst murmur @ apex.  No s3, s4. Abdomen: Soft, non-tender, non-distended, BS + x 4.  Extremities: No clubbing, cyanosis or edema. DP/PT/Radials 2+ and equal bilaterally.  Labs  Pending  Radiology/Studies  No results found.  ECG  St, 121, lbbb, pvc's  ASSESSMENT AND PLAN  See below  Signed, Nicolasa Ducking, NP 01/16/2013, 1:03 PM  Patient seen with NP, agree with the above note.  Patient with history of ETOH and  drug abuse had witnessed cardiac arrest today.  She was down for about 10 minutes in the field, then defibrillated x 4 for shockable rhythm.  In ER, beside quick look echo showed global LV hypokinesis.  Patient was noted to have LBBB. - Hypothermia protocol - LHC urgently given cardiac arrest with LV systolic dysfunction.   - Further recommendations based on cath findings.  [name], MD 01/16/2013 1:03 PM

## 2013-01-16 NOTE — H&P (Signed)
PCCM NOTE  Date of admission: 3/20 Pt Profile:  61 F with no known PMH but reportedly with hx of drug abuse brought to [facility] ED from home via EMS after witnessed arrest. 5 mins of no CPR prior to EMS arrival. approx 10 mins of CPR before ROSC. Initial rhythm reportedly VF. Initial ECG revealed LBBB. Emergent cath > no CAD noted. Hypothermia protocol initiated in ED. Gastric contents noted in oropharynx upon intubation and food particulate matter suctioned from ETT  Hospital Course: 3/19 Admit after OOH arrest. Intubated. Hypothermia protocol initiated 3/19 cardiac cath: no significant CAD   LINES/TUBES: ETT 3/20 >>  L  CVL 3/20 >>  Radial A-line (ordered) 3/20 >>   MICRO:  Resp 3/20 >>   ABX:  Unasyn 3/20 >>   PROPHYLAXIS:  DVT: SQ heparin  SUP: PPI  CONSULTANTS:  LHC Cards 3/20  HPI: As above. No family or witnesses available to provide further history. Reportedly abuse drugs and EtOH but which substances is unknown   Past Medical History  Diagnosis Date  . Crack cocaine use   . ETOH abuse   . Anxiety   . Depression   . Anemia   . Pulmonary embolism     a. 04/2009 - treated with coumadin x 8 mos.  . DVT (deep venous thrombosis)     a. 04/2009 - treated with coumadin x 8 mos.  . Rectal bleeding     a. int hemorrhoids by colonoscopy in 04/2006, Bx neg for IBD  . Duodenitis     a. 05/2006 2/2 H pylori +/- NSAIDS  . History of CVA (cerebrovascular accident)   . Tobacco abuse   . Chest pain     a. 04/2009 Echo: EF 55-60%, Gr1 DD, Mild MR.    MEDICATIONS:  None  History   Social History  . Marital Status: N/A    Spouse Name: N/A    Number of Children: N/A  . Years of Education: N/A   Occupational History  . Not on file.   Social History Main Topics  . Smoking status: Current Every Day Smoker  . Smokeless tobacco: Not on file     Comment: smokes about 4-5 cigs/day  . Alcohol Use: Not on file     Comment: beer and liquor  . Drug Use: Not on file  .  Sexually Active: Not on file   Other Topics Concern  . Not on file   Social History Narrative   Pt lives in [place] with a roommate.          Family History  Problem Relation Age of Onset  . Breast cancer    . Diabetes Mother   . CAD Mother   . Colon cancer Father   . CAD Father   . Breast cancer Sister   . Diabetes Sister   . Diabetes Brother   . CAD Brother     ROS Cannot obtain due to comatose state  Filed Vitals:   01/16/13 1205 01/16/13 1225 01/16/13 1239 01/16/13 1330  BP: 171/105   166/110  Pulse: 109 117  107  Resp: 17   34  Height:   5\' 4"  (1.626 m)   Weight:   55.339 kg (122 lb)   SpO2: 90%   90%    EXAM:  Gen: WDWN, comatose HEENT: /AT, PERRL Neck: no JVD noted Lungs: coarse bilateral BS, no wheezes noted Cardiovascular: Tachy, regular, I/VI syst M @ LLSB Abdomen: mildly distended, soft, NT, diminished BS Ext: warm,  no edema Neuro: No spontaneous movement prior to NMBs, PERRL Skin: no rashes, no track marks noted  DATA: BMET    Component Value Date/Time   NA 142 01/16/2013 1133   K 2.7* 01/16/2013 1133   CL 102 01/16/2013 1133   CO2 17* 01/16/2013 1133   GLUCOSE 310* 01/16/2013 1133   BUN 13 01/16/2013 1133   CREATININE 0.75 01/16/2013 1133   CALCIUM 8.9 01/16/2013 1133   GFRNONAA >90 01/16/2013 1133   GFRAA >90 01/16/2013 1133    CBC    Component Value Date/Time   WBC 15.1* 01/16/2013 1133   RBC 3.73* 01/16/2013 1133   HGB 11.4* 01/16/2013 1133   HCT 34.6* 01/16/2013 1133   PLT 145* 01/16/2013 1133   MCV 92.8 01/16/2013 1133   MCH 30.6 01/16/2013 1133   MCHC 32.9 01/16/2013 1133   RDW 14.3 01/16/2013 1133   LYMPHSABS 9.0* 01/16/2013 1133   MONOABS 0.8 01/16/2013 1133   EOSABS 0.2 01/16/2013 1133   BASOSABS 0.0 01/16/2013 1133    Hepatic Function Panel     Component Value Date/Time   PROT 7.1 01/16/2013 1133   ALBUMIN 3.3* 01/16/2013 1133   AST 361* 01/16/2013 1133   ALT 213* 01/16/2013 1133   ALKPHOS 81 01/16/2013 1133   BILITOT 0.2* 01/16/2013  1133    Cardiac Panel (last 3 results) No results found for this basename: CKTOTAL, CKMB, TROPONINI, RELINDX,  in the last 72 hours   CXR: edema pattern  IMPRESSION:   Principal Problem:   Cardiac arrest Active Problems:   Ventricular fibrillation   Acute respiratory failure with hypoxia   Pulmonary edema   Anoxic encephalopathy   Aspiration into respiratory tract   Hypertension   Hypokalemia   Left bundle branch block   Thrombocytopenia, mild   Hyperglycemia, no hx of DM   Polysubstance abuse   Alcohol abuse   Smoker    ASSESSMENT / PLAN:  PULMONARY A: VDRF post arrest Pulmonary edema pattern on CXR - cardiogenic vs non-cardiogenic (aspiration) P:   Vent settings established Vent bundle implemented  CARDIOVASCULAR A: Cardiac arrest, initial rhythm VF - 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 Suspect post arrest/CPR phenomenon No CAD by [facility] 3/20 P:  Cards following   RENAL A:  Hypokalemia P:   Correct electrolytes as indicated   GASTROINTESTINAL A:  P:   OGT placed Consider nutrition after rewarmed   HEMATOLOGIC A:  Mild thrombocytopenia P:  Monitor plts while on SQ heparin  INFECTIOUS A:  Suspected aspiration P:   Micro aand abx as above  ENDOCRINE A:  Hyperglycemia without prior hx of DM P:   SSI ordered  NEUROLOGIC A:  Post anoxic encephalopathy Polysubstance abuse inc cocaine and EtOH P:   Sedation per [facility] protocol Will likely need further eval after rewarming  TODAY'S SUMMARY:   I have personally obtained a history, examined the patient, evaluated laboratory and imaging results, formulated the assessment and plan and placed orders. CRITICAL CARE: The patient is critically ill with multiple organ systems failure and requires high complexity decision making for assessment and support, frequent evaluation and titration of therapies, application of advanced monitoring technologies and extensive interpretation of  multiple databases. Critical Care Time devoted to patient care services described in this note is 45 minutes.    Billy Fischer, MD ; [facility] [phone].  After 5:30 PM or weekends, call [phone]

## 2013-01-16 NOTE — Progress Notes (Signed)
While in cath lab, pt's O2 sat was 80% on 100% FIO2 and PEEP 5. RT increased PEEP to 10 per MD. Pt's sat's only rose to about 86%, SpO2 on ABG done in cath lab was only 79%. RT will continue to monitor.

## 2013-01-16 NOTE — ED Notes (Signed)
Cardiologist aware chest x ray not done to confirm central line & ETT placement.  To go to cath lab now prior to X ray

## 2013-01-16 NOTE — CV Procedure (Signed)
   Cardiac Catheterization Operative Report  [name] [id number] 3/20/201412:59 PM SAWHNEY,MEGHA, MD  Procedure Performed:  1. Left Heart Catheterization 2. Selective Coronary Angiography 3. Left ventricular pressures  Operator: Oma Alpert, MD  Indication: 51 yo AAF with history of DVT/PE, drug abuse, etoh abuse, CVA who presented via EMS to [place] today after cardiopulmonary arrest at home. Her arrest was witnessed by her daughter who reports possible seizure activity. Pt with LBBB on EKG. Plans for emergent cardiac cath to exclude acute coronary occlusion as etiology of her cardiac arrest.                               Procedure Details: Emergent consent obtained. The patient was brought to the cath lab from the ED intubated on a Versed and Fentanyl drip, completely sedated on non-responsive. The right groin was prepped and draped in the usual manner. Using the modified Seldinger access technique, a 5 French sheath was placed in the right femoral artery. Standard diagnostic catheters were used to perform selective coronary angiography. A pigtail catheter was used to measure left ventricular pressures.  There were no immediate complications. The patient was taken to the recovery area in stable condition.   Hemodynamic Findings: Central aortic pressure: 169/99 Left ventricular pressure: 162/16/25  Angiographic Findings:  Left main: No obstructive disease noted.   Left Anterior Descending Artery: Large caliber vessel that courses to the apex. There are several moderate caliber diagonal branches.   Circumflex Artery: Large caliber vessel with two moderate caliber marginal branches. No obstructive disease noted.   Right Coronary Artery: Moderate caliber, dominant vessel with no obstructive disease noted.   Left Ventricular Angiogram: Deferred.  Impression: 1. No angiographic evidence of CAD 2. S/p Cardiac arrest  Recommendations: She will be admitted to the  PCCM team in the CCU with plans for [place] cooling protocol.        Complications:  None. The patient tolerated the procedure well.                 

## 2013-01-16 NOTE — Progress Notes (Signed)
  Echocardiogram 2D Echocardiogram has been performed.  Georgian Co 01/16/2013, 4:21 PM

## 2013-01-16 NOTE — ED Provider Notes (Signed)
Year old female was brought in by EMS after successsful resuscitation from cardiac arrest.Arrest was witnessed, but there was a five minute delay instarting CPR. EMS noted v-fib and shocked her several times, gave epinephrine and amiodorone. She arrived with a pulse and spontaneous respirations. King airway was in place with good bilateral breath sounds. Cooling was done, Critical Care medicine consulted and Cardiology consulted.   Date: 01/16/2013 1128  Rate: 121  Rhythm: sinus tachycardia and premature ventricular contractions (PVC)  QRS Axis: normal  Intervals: normal  ST/T Wave abnormalities: normal  Conduction Disutrbances:left bundle branch block  Narrative Interpretation: Sinus tachycardia with PVCs, left bundle branch proximal. No prior ECG available for comparison.  Old EKG Reviewed: none available   Date: 01/16/2013 1145  Rate: 113  Rhythm: sinus tachycardia and premature ventricular contractions (PVC)  QRS Axis: normal  Intervals: normal  ST/T Wave abnormalities: normal  Conduction Disutrbances:left bundle branch block  Narrative Interpretation: Sinus tachycardia with PVCs unchanged from prior ECG  Old EKG Reviewed: unchanged    Patient required removal of king airway and placement of an endotracheal tube. This was done by Dr.Wofford under my direction. I was present for the entire procedure of RSI and intubation.  I saw and evaluated the patient, reviewed the resident's note and I agree with the findings and plan.    Dione Booze, MD 01/17/13 1151

## 2013-01-16 NOTE — ED Notes (Signed)
RT attempted Arterial Line.  Flash received however was unsuccessful with threading catheter.  Guidewire and catheter pulled, still intact.  Pressure held to site for 5 minutes.  Site left intact.

## 2013-01-16 NOTE — ED Notes (Signed)
Pt. Is in a ST 120, Intensivist at the bedside placing  Central line,

## 2013-01-17 ENCOUNTER — Inpatient Hospital Stay (HOSPITAL_COMMUNITY): Payer: Self-pay

## 2013-01-17 ENCOUNTER — Encounter (HOSPITAL_COMMUNITY): Payer: Self-pay

## 2013-01-17 DIAGNOSIS — IMO0002 Reserved for concepts with insufficient information to code with codable children: Secondary | ICD-10-CM

## 2013-01-17 LAB — BASIC METABOLIC PANEL
BUN: 12 mg/dL (ref 6–23)
BUN: 10 mg/dL (ref 6–23)
BUN: 7 mg/dL (ref 6–23)
BUN: 8 mg/dL (ref 6–23)
CO2: 21 mEq/L (ref 19–32)
CO2: 17 mEq/L — ABNORMAL LOW (ref 19–32)
Calcium: 6.3 mg/dL — CL (ref 8.4–10.5)
Calcium: 8.5 mg/dL (ref 8.4–10.5)
Chloride: 109 mEq/L (ref 96–112)
Chloride: 111 mEq/L (ref 96–112)
Creatinine, Ser: 0.31 mg/dL — ABNORMAL LOW (ref 0.50–1.10)
Creatinine, Ser: 0.44 mg/dL — ABNORMAL LOW (ref 0.50–1.10)
GFR calc Af Amer: >90 mL/min (ref >90)
GFR calc non Af Amer: >90 mL/min (ref >90)
GFR calc non Af Amer: >90 mL/min (ref >90)
GFR calc non Af Amer: >90 mL/min (ref >90)
GFR calc non Af Amer: >90 mL/min (ref >90)
Glucose, Bld: 136 mg/dL — ABNORMAL HIGH (ref 70–99)
Glucose, Bld: 141 mg/dL — ABNORMAL HIGH (ref 70–99)
Glucose, Bld: 155 mg/dL — ABNORMAL HIGH (ref 70–99)
Glucose, Bld: 158 mg/dL — ABNORMAL HIGH (ref 70–99)
Potassium: 3.1 mEq/L — ABNORMAL LOW (ref 3.5–5.1)
Potassium: 3.3 mEq/L — ABNORMAL LOW (ref 3.5–5.1)
Potassium: 4.1 mEq/L (ref 3.5–5.1)
Sodium: 142 mEq/L (ref 135–145)
Sodium: 143 mEq/L (ref 135–145)
Sodium: 144 mEq/L (ref 135–145)

## 2013-01-17 LAB — GLUCOSE, CAPILLARY
Glucose-Capillary: 130 mg/dL — ABNORMAL HIGH (ref 70–99)
Glucose-Capillary: 143 mg/dL — ABNORMAL HIGH (ref 70–99)
Glucose-Capillary: 146 mg/dL — ABNORMAL HIGH (ref 70–99)
Glucose-Capillary: 148 mg/dL — ABNORMAL HIGH (ref 70–99)
Glucose-Capillary: 152 mg/dL — ABNORMAL HIGH (ref 70–99)
Glucose-Capillary: 167 mg/dL — ABNORMAL HIGH (ref 70–99)
Glucose-Capillary: 94 mg/dL (ref 70–99)

## 2013-01-17 LAB — POCT I-STAT, CHEM 8
Calcium, Ion: 1.11 mmol/L — ABNORMAL LOW (ref 1.12–1.23)
Creatinine, Ser: 0.6 mg/dL (ref 0.50–1.10)
Hemoglobin: 12.6 g/dL (ref 12.0–15.0)
Sodium: 143 mEq/L (ref 135–145)
TCO2: 24 mmol/L (ref 0–100)

## 2013-01-17 LAB — CBC
HCT: 33 % — ABNORMAL LOW (ref 36.0–46.0)
Hemoglobin: 11.2 g/dL — ABNORMAL LOW (ref 12.0–15.0)
RBC: 3.68 MIL/uL — ABNORMAL LOW (ref 3.87–5.11)
WBC: 13 10*3/uL — ABNORMAL HIGH (ref 4.0–10.5)

## 2013-01-17 LAB — MAGNESIUM: Magnesium: 1.7 mg/dL (ref 1.5–2.5)

## 2013-01-17 MED ORDER — SODIUM CHLORIDE 0.9 % IV SOLN: INTRAVENOUS | Status: DC

## 2013-01-17 MED ORDER — SODIUM CHLORIDE 0.9 % IV SOLN
INTRAVENOUS | Status: DC
  Administered 2013-01-21: 10 mL/h via INTRAVENOUS

## 2013-01-17 MED ORDER — SODIUM CHLORIDE 0.9 % IV BOLUS (SEPSIS)
750.0000 mL | Freq: Once | INTRAVENOUS | Status: AC
  Administered 2013-01-17: 750 mL via INTRAVENOUS

## 2013-01-17 MED ORDER — MAGNESIUM SULFATE IN D5W 10-5 MG/ML-% IV SOLN
1.0000 g | Freq: Once | INTRAVENOUS | Status: AC
  Administered 2013-01-17: 1 g via INTRAVENOUS
  Filled 2013-01-17: qty 100

## 2013-01-17 MED ORDER — POTASSIUM CHLORIDE 10 MEQ/50ML IV SOLN
INTRAVENOUS | Status: AC
  Administered 2013-01-17: 10 meq
  Filled 2013-01-17: qty 50

## 2013-01-17 MED ORDER — DEXTROSE 10 % IV SOLN: INTRAVENOUS | Status: DC | PRN

## 2013-01-17 MED ORDER — SODIUM CHLORIDE 0.9 % IV SOLN
INTRAVENOUS | Status: DC
  Administered 2013-01-17: 08:00:00 via INTRAVENOUS

## 2013-01-17 MED ORDER — MAGNESIUM SULFATE 50 % IJ SOLN
1.0000 g | Freq: Once | INTRAVENOUS | Status: DC
  Filled 2013-01-17: qty 2

## 2013-01-17 MED ORDER — SODIUM CHLORIDE 0.9 % IV SOLN
350.0000 mg | Freq: Once | INTRAVENOUS | Status: AC
  Administered 2013-01-17: 350 mg via INTRAVENOUS
  Filled 2013-01-17: qty 3.5

## 2013-01-17 MED ORDER — POTASSIUM CHLORIDE 10 MEQ/100ML IV SOLN: 10.0000 meq | Freq: Once | INTRAVENOUS | Status: DC

## 2013-01-17 MED ORDER — POTASSIUM CHLORIDE 10 MEQ/50ML IV SOLN
10.0000 meq | INTRAVENOUS | Status: AC
  Administered 2013-01-17 (×2): 10 meq via INTRAVENOUS
  Filled 2013-01-17 (×2): qty 50

## 2013-01-17 MED ORDER — POTASSIUM PHOSPHATE DIBASIC 3 MMOLE/ML IV SOLN
10.0000 mmol | Freq: Once | INTRAVENOUS | Status: AC
  Administered 2013-01-17: 10 mmol via INTRAVENOUS
  Filled 2013-01-17: qty 3.33

## 2013-01-17 MED ORDER — INSULIN ASPART 100 UNIT/ML ~~LOC~~ SOLN
2.0000 [IU] | SUBCUTANEOUS | Status: DC
  Administered 2013-01-17: 2 [IU] via SUBCUTANEOUS
  Administered 2013-01-17: 4 [IU] via SUBCUTANEOUS
  Administered 2013-01-17: 2 [IU] via SUBCUTANEOUS
  Administered 2013-01-17: 4 [IU] via SUBCUTANEOUS

## 2013-01-17 MED ORDER — INSULIN GLARGINE 100 UNIT/ML ~~LOC~~ SOLN
10.0000 [IU] | SUBCUTANEOUS | Status: DC
  Administered 2013-01-17: 10 [IU] via SUBCUTANEOUS
  Filled 2013-01-17 (×2): qty 0.1

## 2013-01-17 NOTE — Progress Notes (Signed)
Patient Name: [name] Date of Encounter: 01/17/2013     Principal Problem:   Cardiac arrest Active Problems:   Ventricular fibrillation   Acute respiratory failure with hypoxia   Pulmonary edema   Aspiration into respiratory tract   Hypertension   Polysubstance abuse   Alcohol abuse   Smoker   Hypokalemia   Left bundle branch block   Thrombocytopenia, mild   Hyperglycemia, no hx of DM   Anoxic encephalopathy    SUBJECTIVE  Patient seen earlier and examined.  She is ventilated and sedated.  SP OOH witnessed arrest with VF, with no culprit at cath time.    CURRENT MEDS . ampicillin-sulbactam (UNASYN) IV  1.5 g Intravenous Q8H  . antiseptic oral rinse  15 mL Mouth Rinse QID  . artificial tears  1 application Both Eyes Q8H  . calcium chloride 350 mg IVPB  350 mg Intravenous Once  . chlorhexidine  15 mL Mouth Rinse BID  . cisatracurium  0.1 mg/kg Intravenous Once  . fentaNYL  100 mcg Intravenous Once  . heparin  5,000 Units Subcutaneous Q8H  . insulin aspart  2-6 Units Subcutaneous Q4H  . insulin glargine  10 Units Subcutaneous Q24H  . midazolam  2 mg Intravenous Once  . pantoprazole (PROTONIX) IV  40 mg Intravenous Q24H  . potassium phosphate IVPB (mmol)  10 mmol Intravenous Once    OBJECTIVE  Filed Vitals:   01/17/13 1700 01/17/13 1730 01/17/13 1745 01/17/13 1800  BP: 107/67 124/81 125/84 123/81  Pulse:      Temp: 91.6 F (33.1 C) 91 F (32.8 C)  90.9 F (32.7 C)  TempSrc: Core (Comment) Core (Comment)  Core (Comment)  Resp: 14 14 14 14   Height:      Weight:      SpO2: 100% 100% 100% 100%    Intake/Output Summary (Last 24 hours) at 01/17/13 1905 Last data filed at 01/17/13 1806  Gross per 24 hour  Intake 3174.68 ml  Output   1025 ml  Net 2149.68 ml   Filed Weights   01/16/13 1239 01/16/13 1430  Weight: 122 lb (55.339 kg) 127 lb 13.9 oz (58 kg)    PHYSICAL EXAM  General: intubated, sedated. HEENT:  Normal.  ET tube  Neck: Supple  without bruits or JVD. Lungs:  Clear anteriorly Heart: RRR no s3,  Pos S4 Abdomen: Soft, non-tender, non-distended, BS + x 4.  Extremities: No clubbing, cyanosis or edema. DP/PT/Radials 2+ and equal bilaterally.  Accessory Clinical Findings  CBC  Recent Labs  01/16/13 1133  01/16/13 1957 01/17/13 0443  WBC 15.1*  --   --  13.0*  NEUTROABS 5.1  --   --   --   HGB 11.4*  < > 12.6 11.2*  HCT 34.6*  < > 37.0 33.0*  MCV 92.8  --   --  89.7  PLT 145*  --   --  155  < > = values in this interval not displayed. Basic Metabolic Panel  Recent Labs  01/17/13 0443 01/17/13 1031 01/17/13 1500  NA 143 142 146*  K 3.3* 4.1 2.8*  CL 109 111 116*  CO2 20 21 17*  GLUCOSE 177* 158* 136*  BUN 12 10 7   CREATININE 0.44* 0.45* 0.31*  CALCIUM 8.2* 8.4 6.3*  MG  --  1.7  --   PHOS  --  2.0*  --    Liver Function Tests  Recent Labs  01/16/13 1133  AST 361*  ALT 213*  ALKPHOS 81  BILITOT 0.2*  PROT 7.1  ALBUMIN 3.3*   No results found for this basename: LIPASE, AMYLASE,  in the last 72 hours Cardiac Enzymes No results found for this basename: CKTOTAL, CKMB, CKMBINDEX, TROPONINI,  in the last 72 hours BNP No components found with this basename: POCBNP,  D-Dimer No results found for this basename: DDIMER,  in the last 72 hours Hemoglobin A1C No results found for this basename: HGBA1C,  in the last 72 hours Fasting Lipid Panel No results found for this basename: CHOL, HDL, LDLCALC, TRIG, CHOLHDL, LDLDIRECT,  in the last 72 hours Thyroid Function Tests No results found for this basename: TSH, T4TOTAL, FREET3, T3FREE, THYROIDAB,  in the last 72 hours   Radiology/Studies  Dg Chest Port 1 View  01/17/2013  *RADIOLOGY REPORT*  Clinical Data: Respiratory failure  PORTABLE CHEST - 1 VIEW  Comparison: Yesterday  Findings: Tubular structures stable.  Bilateral consolidation right greater than left improved.  Normal heart size.  No pneumothorax.  IMPRESSION: Improved airspace disease.    Original Report Authenticated By: Jolaine Click, M.D.    Dg Chest Port 1 View  01/16/2013  **ADDENDUM** CREATED: 01/16/2013 14:23:00  This has been made a PRA call report utilizing dashboard call feature.  **END ADDENDUM** SIGNED BY: Almedia Balls. Constance Goltz, M.D.   01/16/2013  *RADIOLOGY REPORT*  Clinical Data: Line placement.  PORTABLE CHEST - 1 VIEW  Comparison: None.  Findings: Left central line tip projects at the level of the distal superior vena cava.  Endotracheal tube tip 4 cm above the carina.  Nasogastric tube courses below the diaphragm.  The tip is not included on this exam.  No gross pneumothorax.  Heart slightly enlarged.  Diffuse air space disease greater on the right suggestive of pulmonary edema.  Infectious infiltrate not excluded.  Calcified aorta consistent with age advanced atherosclerotic type changes.  IMPRESSION: Left central line tip projects at the level of the distal superior vena cava.  Endotracheal tube tip 4 cm above the carina.  Nasogastric tube courses below the diaphragm.  The tip is not included on this exam.  Heart slightly enlarged.  Diffuse air space disease greater on the right suggestive of pulmonary edema.  Infectious infiltrate not excluded.  This is a call report.   Original Report Authenticated By: Lacy Duverney, M.D.     ECHO  Study Conclusions  - Left ventricle: The cavity size was normal. Wall thickness was normal. Systolic function was severely reduced. The estimated ejection fraction was in the range of 20% to 25%. Diffuse hypokinesis. There is akinesis of the anteroseptal and apical myocardium. Doppler parameters are consistent with abnormal left ventricular relaxation (grade 1 diastolic dysfunction). - Mitral valve: Calcified annulus. Moderate regurgitation. - Left atrium: The atrium was moderately dilated. - Pulmonary arteries: Systolic pressure was mildly increased. PA peak pressure: 34mm Hg (S). Impressions:  - Eccentric, anteriorly directed moderate  MR suggests prolapse of posterior MV leaflet.  Hemodynamic Findings:  Central aortic pressure: 169/99  Left ventricular pressure: 162/16/25  Angiographic Findings:  Left main: No obstructive disease noted.  Left Anterior Descending Artery: Large caliber vessel that courses to the apex. There are several moderate caliber diagonal branches.  Circumflex Artery: Large caliber vessel with two moderate caliber marginal branches. No obstructive disease noted.  Right Coronary Artery: Moderate caliber, dominant vessel with no obstructive disease noted.  Left Ventricular Angiogram: Deferred.  Impression:  1. No angiographic evidence of CAD  2. S/p Cardiac arrest  Recommendations: She will  be admitted to the PCCM team in the CCU with plans for [facility] cooling protocol.  Complications: None. The patient tolerated the procedure well.    ASSESSMENT AND PLAN  1.  SP cardiac arrest with OOH VF arrest/no CAD/echo with severe dysfunction 2.  History of drug use  ---  Neg screen on admit --neg except for benzos 3.  LBBB on ECG.    Will need further cardiac wu with EP if neuro status improves.  Likely would need ICD given history of presentation.    Signed, Shawnie Pons MD, [facility], FSCAI

## 2013-01-17 NOTE — Progress Notes (Signed)
INITIAL NUTRITION ASSESSMENT  DOCUMENTATION CODES Per approved criteria  -Not Applicable   INTERVENTION: 1. If EN is warranted, recommend initiate [place] 1.2 @ 20 ml/hr and increase by 10 ml q 4 hr to a goal rate of 40 ml/hr with 30 ml Pro-stat BID. This EN regimen would provide 1352 kcal, 83 gm protein, and 787 ml free water.  2. Given pt's Etoh abuse, pt is at increased risk for refeeding syndrome, recommend monitor Mag, Phos and K+ x 3 days.  3. RD will continue to follow    NUTRITION DIAGNOSIS: Inadequate oral intake related to inability to eat as evidenced by NPO diet.   Goal: Pt to meet >/=90% estimated nutrition needs.   Monitor:  Vent status, initiation of EN, weight trends, labs  Reason for Assessment: VRDF  52 y.o. female  Admitting Dx: Cardiac arrest  ASSESSMENT: Pt with hx of drug abuse with witnessed arrest at home. No CPR until arrival of EMS (5 minutes), 10 minutes of CPR until ROSC. Pt intubated and started on hypothermia protocol. Per RN, pt will begin rewarming phase this evening.   Recommend initiation of EN once pt has been rewarmed if likely to continue on vent. Pt with known Etoh abuse, this places pt at increased risk for refeeding syndrome. No family present at time of RD visit for nutrition hx.   Height: Ht Readings from Last 1 Encounters:  01/16/13 5\' 4"  (1.626 m)    Weight: Wt Readings from Last 1 Encounters:  01/16/13 127 lb 13.9 oz (58 kg)    Ideal Body Weight: 120 lbs   % Ideal Body Weight: 106%  Wt Readings from Last 10 Encounters:  01/16/13 127 lb 13.9 oz (58 kg)    Usual Body Weight: unknown  % Usual Body Weight: --  BMI:  Body mass index is 21.94 kg/(m^2). WNL  Patient is currently intubated on ventilator support.  MV: 6.7 Temp:Temp (24hrs), Avg:92.5 F (33.6 C), Min:90.7 F (32.6 C), Max:96.1 F (35.6 C) Assume 37 C once rewarmed  Propofol: none   Estimated Nutritional Needs: Kcal: 1318 Protein: >/= 70 gm Fluid:  >/= 1.3 L  Skin: intact, +1 edema   Diet Order:   NPO  EDUCATION NEEDS: -No education needs identified at this time   Intake/Output Summary (Last 24 hours) at 01/17/13 1206 Last data filed at 01/17/13 1127  Gross per 24 hour  Intake 1971.37 ml  Output   1490 ml  Net 481.37 ml    Last BM: PTA    Labs:   Recent Labs Lab 01/17/13 01/17/13 0443 01/17/13 1031  NA 144 143 142  K 3.1* 3.3* 4.1  CL 110 109 111  CO2 22 20 21   BUN 12 12 10   CREATININE 0.46* 0.44* 0.45*  CALCIUM 8.2* 8.2* 8.4  MG  --   --  1.7  PHOS  --   --  2.0*  GLUCOSE 141* 177* 158*    CBG (last 3)   Recent Labs  01/17/13 0158 01/17/13 0303 01/17/13 0440  GLUCAP 148* 146* 152*    Scheduled Meds: . ampicillin-sulbactam (UNASYN) IV  1.5 g Intravenous Q8H  . antiseptic oral rinse  15 mL Mouth Rinse QID  . artificial tears  1 application Both Eyes Q8H  . chlorhexidine  15 mL Mouth Rinse BID  . cisatracurium  0.1 mg/kg Intravenous Once  . fentaNYL  100 mcg Intravenous Once  . heparin  5,000 Units Subcutaneous Q8H  . insulin aspart  2-6 Units Subcutaneous Q4H  .  insulin glargine  10 Units Subcutaneous Q24H  . magnesium sulfate LVP 250-500 ml  1 g Intravenous Once  . midazolam  2 mg Intravenous Once  . pantoprazole (PROTONIX) IV  40 mg Intravenous Q24H  . potassium phosphate IVPB (mmol)  10 mmol Intravenous Once    Continuous Infusions: . sodium chloride 10 mL/hr at 01/17/13 0800  . sodium chloride 10 mL/hr at 01/17/13 0800  . sodium chloride 10 mL/hr at 01/17/13 0800  . cisatracurium (NIMBEX) infusion 1.5 mcg/kg/min (01/16/13 1421)  . cisatracurium (NIMBEX) infusion 1.5 mcg/kg/min (01/17/13 0800)  . dextrose    . fentaNYL infusion INTRAVENOUS 300 mcg/hr (01/17/13 0800)  . midazolam (VERSED) infusion 4 mg/hr (01/17/13 0922)  . norepinephrine (LEVOPHED) Adult infusion 3 mcg/min (01/17/13 1127)    Past Medical History  Diagnosis Date  . Crack cocaine use   . ETOH abuse   . Anxiety    . Depression   . Pulmonary embolism     a. 04/2009 - treated with coumadin x 8 mos.  . DVT (deep venous thrombosis)     a. 04/2009 - treated with coumadin x 8 mos.  . Rectal bleeding     a. int hemorrhoids by colonoscopy in 04/2006, Bx neg for IBD  . Duodenitis     a. 05/2006 2/2 H pylori +/- NSAIDS  . History of CVA (cerebrovascular accident)   . Tobacco abuse   . Chest pain     a. 04/2009 Echo: EF 55-60%, Gr1 DD, Mild MR.  Marland Kitchen Hypertension   . Heart murmur   . Stroke   . Seizures   . Anemia   . Vitamin B12 deficiency anemia     History reviewed. No pertinent past surgical history.  Clarene Duke RD, LDN Pager [phone] After Hours pager [phone]

## 2013-01-17 NOTE — Progress Notes (Signed)
Pt's daughter at bedside; she states she does not want pt's condition discussed with anyone besides immediate family

## 2013-01-17 NOTE — Progress Notes (Signed)
PCCM NOTE  Date of admission: 3/20 Pt Profile:  72 F with no known PMH but reportedly with hx of drug abuse brought to [facility] ED from home via EMS after witnessed arrest. 5 mins of no CPR prior to EMS arrival. approx 10 mins of CPR before ROSC. Initial rhythm reportedly VF. Initial ECG revealed LBBB. Emergent cath > no CAD noted. Hypothermia protocol initiated in ED. Gastric contents noted in oropharynx upon intubation and food particulate matter suctioned from ETT  Hospital Course: 3/19 Admit after OOH arrest. Intubated. Hypothermia protocol initiated 3/19 cardiac cath: no significant CAD   LINES/TUBES: ETT 3/20 >>  L [place] CVL 3/20 >>  Radial A-line (ordered) 3/20 >>   MICRO:  Resp 3/20 >>   ABX:  Unasyn 3/20 >>   PROPHYLAXIS:  DVT: SQ heparin  SUP: PPI  CONSULTANTS:  [facility] Cards 3/20  HPI: As above. No family or witnesses available to provide further history. Reportedly abuse drugs and EtOH but which substances is unknown   SUBJECTIVE/OVERNIGHT/INTERVAL HX 01/17/13 - due for rewarming at 17.30 today. Currently on levophed 62mcg/min   Filed Vitals:   01/17/13 1015 01/17/13 1100 01/17/13 1125 01/17/13 1130  BP: 126/86 88/62 89/66  95/70  Pulse:      Temp:  90.7 F (32.6 C)    TempSrc:  Core (Comment)    Resp: 14 14 14 14   Height:      Weight:      SpO2: 100% 100% 100% 100%    EXAM:  Gen: WDWN, comatose HEENT: /AT, PERRL Neck: no JVD noted Lungs: coarse bilateral BS, no wheezes noted Cardiovascular: Tachy, regular, I/VI syst M @ LLSB Abdomen: mildly distended, soft, NT, diminished BS Ext: warm, no edema Neuro: No spontaneous movement prior to NMBs, PERRL Skin: no rashes, no track marks noted  DATA: BMET    Component Value Date/Time   NA 142 01/17/2013 1031   K 4.1 01/17/2013 1031   CL 111 01/17/2013 1031   CO2 21 01/17/2013 1031   GLUCOSE 158* 01/17/2013 1031   BUN 10 01/17/2013 1031   CREATININE 0.45* 01/17/2013 1031   CALCIUM 8.4 01/17/2013 1031   GFRNONAA  >90 01/17/2013 1031   GFRAA >90 01/17/2013 1031    CBC    Component Value Date/Time   WBC 13.0* 01/17/2013 0443   RBC 3.68* 01/17/2013 0443   HGB 11.2* 01/17/2013 0443   HCT 33.0* 01/17/2013 0443   PLT 155 01/17/2013 0443   MCV 89.7 01/17/2013 0443   MCH 30.4 01/17/2013 0443   MCHC 33.9 01/17/2013 0443   RDW 14.0 01/17/2013 0443   LYMPHSABS 9.0* 01/16/2013 1133   MONOABS 0.8 01/16/2013 1133   EOSABS 0.2 01/16/2013 1133   BASOSABS 0.0 01/16/2013 1133    Hepatic Function Panel     Component Value Date/Time   PROT 7.1 01/16/2013 1133   ALBUMIN 3.3* 01/16/2013 1133   AST 361* 01/16/2013 1133   ALT 213* 01/16/2013 1133   ALKPHOS 81 01/16/2013 1133   BILITOT 0.2* 01/16/2013 1133    Cardiac Panel (last 3 results) No results found for this basename: CKTOTAL, CKMB, TROPONINI, RELINDX,  in the last 72 hours   IMAGING x 48h Dg Chest Port 1 View  01/17/2013  *RADIOLOGY REPORT*  Clinical Data: Respiratory failure  PORTABLE CHEST - 1 VIEW  Comparison: Yesterday  Findings: Tubular structures stable.  Bilateral consolidation right greater than left improved.  Normal heart size.  No pneumothorax.  IMPRESSION: Improved airspace disease.   Original Report Authenticated  By: Jolaine Click, M.D.    Dg Chest Port 1 View  01/16/2013  **ADDENDUM** CREATED: 01/16/2013 14:23:00  This has been made a PRA call report utilizing dashboard call feature.  **END ADDENDUM** SIGNED BY: Almedia Balls. Constance Goltz, M.D.   01/16/2013  *RADIOLOGY REPORT*  Clinical Data: Line placement.  PORTABLE CHEST - 1 VIEW  Comparison: None.  Findings: Left central line tip projects at the level of the distal superior vena cava.  Endotracheal tube tip 4 cm above the carina.  Nasogastric tube courses below the diaphragm.  The tip is not included on this exam.  No gross pneumothorax.  Heart slightly enlarged.  Diffuse air space disease greater on the right suggestive of pulmonary edema.  Infectious infiltrate not excluded.  Calcified aorta consistent with age  advanced atherosclerotic type changes.  IMPRESSION: Left central line tip projects at the level of the distal superior vena cava.  Endotracheal tube tip 4 cm above the carina.  Nasogastric tube courses below the diaphragm.  The tip is not included on this exam.  Heart slightly enlarged.  Diffuse air space disease greater on the right suggestive of pulmonary edema.  Infectious infiltrate not excluded.  This is a call report.   Original Report Authenticated By: Lacy Duverney, M.D.      IMPRESSION:   Principal Problem:   Cardiac arrest Active Problems:   Ventricular fibrillation   Acute respiratory failure with hypoxia   Pulmonary edema   Aspiration into respiratory tract   Hypertension   Polysubstance abuse   Alcohol abuse   Smoker   Hypokalemia   Left bundle branch block   Thrombocytopenia, mild   Hyperglycemia, no hx of DM   Anoxic encephalopathy  .=  ASSESSMENT / PLAN:  PULMONARY A: VDRF post arrest Pulmonary edema pattern on CXR - cardiogenic vs non-cardiogenic (aspiration) -01/17/13- not ready fro sbt P:   Vent bundle implemented  CARDIOVASCULAR A: Cardiac arrest, initial rhythm VF - 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 Suspect post arrest/CPR phenomenon No CAD by [facility] 3/20 0 01/17/13 - needing levophed  P:  Cards following   RENAL  Recent Labs Lab 01/16/13 1401  01/16/13 1957 01/16/13 2001 01/17/13 01/17/13 0443 01/17/13 1031  NA 144  < > 146* 145 144 143 142  K 3.6  < > 3.4* 3.3* 3.1* 3.3* 4.1  CL 106  < > 113* 112 110 109 111  CO2 22  --   --  22 22 20 21   GLUCOSE 292*  < > 163* 166* 141* 177* 158*  BUN 13  < > 12 12 12 12 10   CREATININE 0.63  < > 0.50 0.52 0.46* 0.44* 0.45*  CALCIUM 7.7*  --   --  8.2* 8.2* 8.2* 8.4  MG  --   --   --   --   --   --  1.7  PHOS  --   --   --   --   --   --  2.0*  < > = values in this interval not displayed.  A:  Mild hypomag and hypophos P:   Correct mag and phos  GASTROINTESTINAL A:  P:   OGT  placed Consider nutrition after rewarmed   HEMATOLOGIC  Recent Labs Lab 01/16/13 1133  01/16/13 1758 01/16/13 1957 01/17/13 0443  HGB 11.4*  < > 13.3 12.6 11.2*  HCT 34.6*  < > 39.0 37.0 33.0*  WBC 15.1*  --   --   --  13.0*  PLT 145*  --   --   --  155  < > = values in this interval not displayed.  A:  Mild thrombocytopenia improving P:  Monitor plts while on SQ heparin  INFECTIOUS No results found for this basename: LATICACIDVEN, PROCALCITON,  in the last 168 hours  A:  Suspected aspiration P:   Micro aand abx as above  ENDOCRINE A:  Hyperglycemia without prior hx of DM P:   SSI ordered  NEUROLOGIC A:  Polysubstance abuse inc cocaine and EtOH P:   Sedation per [facility] protocol Will likely need further eval after rewarming  TODAY'S SUMMARY:   I have personally obtained a history, examined the patient, evaluated laboratory and imaging results, formulated the assessment and plan and placed orders. CRITICAL CARE: The patient is critically ill with multiple organ systems failure and requires high complexity decision making for assessment and support, frequent evaluation and titration of therapies, application of advanced monitoring technologies and extensive interpretation of multiple databases. Critical Care Time devoted to patient care services described in this note is 45 minutes.    Dr. Kalman Shan, M.D., [facility].C.P Pulmonary and Critical Care Medicine Staff Physician [facility] [place] [facility] Pager: [phone], If no answer or between  15:00h - 7:00h: call [phone]  01/17/2013 11:54 AM

## 2013-01-17 NOTE — Progress Notes (Signed)
eLink Physician-Brief Progress Note Patient Name: PEGI MILAZZO DOB: [birth year] MRN: [id number]  Date of Service  01/17/2013   HPI/Events of Note   K low Ca low  eICU Interventions  Replete both, K gently since still cool   Intervention Category Major Interventions: Electrolyte abnormality - evaluation and management  Shan Levans 01/17/2013, 5:02 PM

## 2013-01-17 NOTE — Progress Notes (Signed)
Dr Delford Field called regarding pt's low urine output, abnormal potassium and calcium values; orders received; Potassium replacement in progress; IVF bolus in progress; will continue to monitor

## 2013-01-17 NOTE — Progress Notes (Signed)
Dr Delford Field called regarding potassium infusion during rewarming phase; orders received: discontinue potassium replacement dose in 50ml; continue potassium phosphate in during rewarming phase; will continue to closely monitor

## 2013-01-17 NOTE — Progress Notes (Signed)
Chaplain responded to ED after receiving a page. Pt arrived as CPR in progress. Pt went to Cath Lab and later [id number]. Chaplain provided ministry of presence and empathically listened to family's story. Chaplain served as Print production planner between Radiation protection practitioner. Chaplain shared words of hope, encouragement and prayed with family. [name] will follow-up as needed. Family thanked chaplain for his presence and spiritual support. Kelle Darting [phone]  01/16/13 1610  Clinical Encounter Type  Visited With Patient and family together  Visit Type Initial;Spiritual support;Critical Care;ED  Referral From Nurse  Spiritual Encounters  Spiritual Needs Prayer;Emotional  Stress Factors  Patient Stress Factors Lack of knowledge;Not reviewed  Family Stress Factors Major life changes

## 2013-01-17 NOTE — Progress Notes (Signed)
Hypokalemia   K repalced 

## 2013-01-18 ENCOUNTER — Other Ambulatory Visit: Payer: Self-pay

## 2013-01-18 DIAGNOSIS — Z5189 Encounter for other specified aftercare: Secondary | ICD-10-CM

## 2013-01-18 DIAGNOSIS — I4901 Ventricular fibrillation: Secondary | ICD-10-CM

## 2013-01-18 DIAGNOSIS — D696 Thrombocytopenia, unspecified: Secondary | ICD-10-CM

## 2013-01-18 DIAGNOSIS — R7309 Other abnormal glucose: Secondary | ICD-10-CM

## 2013-01-18 LAB — PHOSPHORUS: Phosphorus: 3.5 mg/dL (ref 2.3–4.6)

## 2013-01-18 LAB — BASIC METABOLIC PANEL
BUN: 7 mg/dL (ref 6–23)
CO2: 21 mEq/L (ref 19–32)
Calcium: 7.3 mg/dL — ABNORMAL LOW (ref 8.4–10.5)
Calcium: 8.2 mg/dL — ABNORMAL LOW (ref 8.4–10.5)
Calcium: 8.2 mg/dL — ABNORMAL LOW (ref 8.4–10.5)
Chloride: 111 mEq/L (ref 96–112)
Creatinine, Ser: 0.45 mg/dL — ABNORMAL LOW (ref 0.50–1.10)
Creatinine, Ser: 0.51 mg/dL (ref 0.50–1.10)
GFR calc Af Amer: >90 mL/min (ref >90)
GFR calc non Af Amer: >90 mL/min (ref >90)
GFR calc non Af Amer: >90 mL/min (ref >90)
Glucose, Bld: 96 mg/dL (ref 70–99)
Sodium: 142 mEq/L (ref 135–145)

## 2013-01-18 LAB — CULTURE, RESPIRATORY W GRAM STAIN

## 2013-01-18 LAB — GLUCOSE, CAPILLARY
Glucose-Capillary: 90 mg/dL (ref 70–99)
Glucose-Capillary: 89 mg/dL (ref 70–99)
Glucose-Capillary: 85 mg/dL (ref 70–99)
Glucose-Capillary: 94 mg/dL (ref 70–99)

## 2013-01-18 MED ORDER — MIDAZOLAM BOLUS VIA INFUSION
1.0000 mg | INTRAVENOUS | Status: DC | PRN
  Administered 2013-01-18: 2 mg via INTRAVENOUS
  Filled 2013-01-18: qty 2

## 2013-01-18 MED ORDER — POTASSIUM CHLORIDE 20 MEQ/15ML (10%) PO LIQD
40.0000 meq | Freq: Two times a day (BID) | ORAL | Status: AC
  Administered 2013-01-18 (×2): 40 meq
  Filled 2013-01-18 (×2): qty 30

## 2013-01-18 MED ORDER — FENTANYL BOLUS VIA INFUSION
25.0000 ug | Freq: Four times a day (QID) | INTRAVENOUS | Status: DC | PRN
  Administered 2013-01-18: 100 ug via INTRAVENOUS
  Administered 2013-01-18 – 2013-01-19 (×5): 50 ug via INTRAVENOUS
  Administered 2013-01-20 – 2013-01-21 (×3): 100 ug via INTRAVENOUS
  Filled 2013-01-18: qty 100

## 2013-01-18 MED ORDER — DEXMEDETOMIDINE HCL IN NACL 400 MCG/100ML IV SOLN
0.4000 ug/kg/h | INTRAVENOUS | Status: DC
  Administered 2013-01-18 – 2013-01-19 (×2): 1.2 ug/kg/h via INTRAVENOUS
  Administered 2013-01-19: 0.8 ug/kg/h via INTRAVENOUS
  Administered 2013-01-19 – 2013-01-21 (×7): 1.2 ug/kg/h via INTRAVENOUS
  Administered 2013-01-21: 0.6 ug/kg/h via INTRAVENOUS
  Administered 2013-01-21 (×2): 1.2 ug/kg/h via INTRAVENOUS
  Administered 2013-01-22 (×2): 0.8 ug/kg/h via INTRAVENOUS
  Administered 2013-01-22: 0.703 ug/kg/h via INTRAVENOUS
  Administered 2013-01-23: 0.6 ug/kg/h via INTRAVENOUS
  Administered 2013-01-23: 0.5 ug/kg/h via INTRAVENOUS
  Filled 2013-01-18 (×17): qty 100

## 2013-01-18 MED ORDER — SODIUM CHLORIDE 0.9 % IV SOLN
1.0000 mg/h | INTRAVENOUS | Status: DC
  Administered 2013-01-18: 8 mg/h via INTRAVENOUS
  Filled 2013-01-18 (×2): qty 10

## 2013-01-18 MED ORDER — SODIUM CHLORIDE 0.9 % IV SOLN
25.0000 ug/h | INTRAVENOUS | Status: DC
  Administered 2013-01-18: 200 ug/h via INTRAVENOUS
  Administered 2013-01-18: 300 ug/h via INTRAVENOUS
  Administered 2013-01-19: 125 ug/h via INTRAVENOUS
  Administered 2013-01-20 (×2): 200 ug/h via INTRAVENOUS
  Administered 2013-01-21: 100 ug/h via INTRAVENOUS
  Administered 2013-01-21: 200 ug/h via INTRAVENOUS
  Filled 2013-01-18 (×8): qty 50

## 2013-01-18 MED ORDER — DEXMEDETOMIDINE HCL IN NACL 200 MCG/50ML IV SOLN
0.4000 ug/kg/h | INTRAVENOUS | Status: DC
  Administered 2013-01-18: 1.2 ug/kg/h via INTRAVENOUS
  Administered 2013-01-18: 0.5 ug/kg/h via INTRAVENOUS
  Administered 2013-01-18: 0.4 ug/kg/h via INTRAVENOUS
  Administered 2013-01-18: 1.2 ug/kg/h via INTRAVENOUS
  Filled 2013-01-18 (×4): qty 50

## 2013-01-18 MED ORDER — INSULIN ASPART 100 UNIT/ML ~~LOC~~ SOLN
0.0000 [IU] | SUBCUTANEOUS | Status: DC
  Administered 2013-01-19: 1 [IU] via SUBCUTANEOUS
  Administered 2013-01-19: 2 [IU] via SUBCUTANEOUS
  Administered 2013-01-19 (×2): 1 [IU] via SUBCUTANEOUS
  Administered 2013-01-20 (×2): 2 [IU] via SUBCUTANEOUS
  Administered 2013-01-20: 1 [IU] via SUBCUTANEOUS
  Administered 2013-01-20: 2 [IU] via SUBCUTANEOUS
  Administered 2013-01-20: 1 [IU] via SUBCUTANEOUS
  Administered 2013-01-21 (×3): 2 [IU] via SUBCUTANEOUS
  Administered 2013-01-21: 1 [IU] via SUBCUTANEOUS
  Administered 2013-01-21: 2 [IU] via SUBCUTANEOUS
  Administered 2013-01-22: 1 [IU] via SUBCUTANEOUS
  Administered 2013-01-22: 2 [IU] via SUBCUTANEOUS
  Administered 2013-01-22 (×2): 1 [IU] via SUBCUTANEOUS
  Administered 2013-01-22: 2 [IU] via SUBCUTANEOUS
  Administered 2013-01-23 – 2013-01-25 (×9): 1 [IU] via SUBCUTANEOUS
  Administered 2013-01-25: 2 [IU] via SUBCUTANEOUS
  Administered 2013-01-25 – 2013-01-26 (×2): 1 [IU] via SUBCUTANEOUS

## 2013-01-18 MED ORDER — POTASSIUM CHLORIDE 20 MEQ/15ML (10%) PO LIQD
ORAL | Status: AC
  Filled 2013-01-18: qty 30

## 2013-01-18 MED ORDER — JEVITY 1.2 CAL PO LIQD
1000.0000 mL | ORAL | Status: DC
  Administered 2013-01-18 – 2013-01-22 (×3)
  Filled 2013-01-18 (×6): qty 1000

## 2013-01-18 MED ORDER — LORAZEPAM 2 MG/ML IJ SOLN
0.5000 mg | INTRAMUSCULAR | Status: DC | PRN
  Administered 2013-01-18 – 2013-01-19 (×6): 1 mg via INTRAVENOUS
  Filled 2013-01-18 (×7): qty 1

## 2013-01-18 NOTE — Progress Notes (Signed)
Pt has reached 36 degrees, nimbex off per order.

## 2013-01-18 NOTE — Progress Notes (Signed)
Pt awake, agitated; attempting to pull out IV tube; mits in place bilaterally; hr 105-140's; EKG completed; IV fentanyl and precedex gtt titration in progress; will continue to closely monitor

## 2013-01-18 NOTE — Progress Notes (Signed)
Pt has reached goal temp 37.0.

## 2013-01-18 NOTE — Progress Notes (Signed)
Patient Name: [name] Date of Encounter: 01/18/2013     Principal Problem:   Cardiac arrest Active Problems:   Ventricular fibrillation   Acute respiratory failure with hypoxia   Pulmonary edema   Aspiration into respiratory tract   Hypertension   Polysubstance abuse   Alcohol abuse   Smoker   Hypokalemia   Left bundle branch block   Thrombocytopenia, mild   Hyperglycemia, no hx of DM   Anoxic encephalopathy    SUBJECTIVE  Apparently alert after Nimbex stopped, currently on Fentanyl.    CURRENT MEDS . ampicillin-sulbactam (UNASYN) IV  1.5 g Intravenous Q8H  . antiseptic oral rinse  15 mL Mouth Rinse QID  . chlorhexidine  15 mL Mouth Rinse BID  . cisatracurium  0.1 mg/kg Intravenous Once  . fentaNYL  100 mcg Intravenous Once  . heparin  5,000 Units Subcutaneous Q8H  . insulin aspart  2-6 Units Subcutaneous Q4H  . insulin glargine  10 Units Subcutaneous Q24H  . pantoprazole (PROTONIX) IV  40 mg Intravenous Q24H    OBJECTIVE  Filed Vitals:   01/18/13 0530 01/18/13 0600 01/18/13 0700 01/18/13 0800  BP: 136/75 101/49 121/63 143/70  Pulse:    101  Temp:  98.1 F (36.7 C) 98.6 F (37 C) 99 F (37.2 C)  TempSrc:  Core (Comment) Core (Comment)   Resp: 14 14 14 14   Height:      Weight:      SpO2: 100% 99% 100% 98%    Intake/Output Summary (Last 24 hours) at 01/18/13 0842 Last data filed at 01/18/13 0700  Gross per 24 hour  Intake 3370.61 ml  Output    710 ml  Net 2660.61 ml   Filed Weights   01/16/13 1239 01/16/13 1430  Weight: 122 lb (55.339 kg) 127 lb 13.9 oz (58 kg)    PHYSICAL EXAM  CVP 10  General: intubated HEENT:  intubated  Neck: Supple without bruits or JVD. Lungs:  Resp regular and unlabored, CTA. Heart: RRR no s3,pos S4.   Abdomen: Soft, non-tender, non-distended, BS + x 4.  Extremities: No clubbing, cyanosis or edema. DP/PT/Radials 2+ and equal bilaterally.  Accessory Clinical Findings  CBC  Recent Labs   01/16/13 1133  01/16/13 1957 01/17/13 0443  WBC 15.1*  --   --  13.0*  NEUTROABS 5.1  --   --   --   HGB 11.4*  < > 12.6 11.2*  HCT 34.6*  < > 37.0 33.0*  MCV 92.8  --   --  89.7  PLT 145*  --   --  155  < > = values in this interval not displayed. Basic Metabolic Panel  Recent Labs  01/17/13 1031  01/18/13 0349 01/18/13 0800  NA 142  < > 142 145  K 4.1  < > 3.2* 3.0*  CL 111  < > 110 113*  CO2 21  < > 21 21  GLUCOSE 158*  < > 96 93  BUN 10  < > 7 6  CREATININE 0.45*  < > 0.50 0.51  CALCIUM 8.4  < > 8.2* 7.3*  MG 1.7  --  1.8  --   PHOS 2.0*  --  3.5  --   < > = values in this interval not displayed. Liver Function Tests  Recent Labs  01/16/13 1133  AST 361*  ALT 213*  ALKPHOS 81  BILITOT 0.2*  PROT 7.1  ALBUMIN 3.3*   No results found for this basename: LIPASE,  AMYLASE,  in the last 72 hours Cardiac Enzymes No results found for this basename: CKTOTAL, CKMB, CKMBINDEX, TROPONINI,  in the last 72 hours BNP No components found with this basename: POCBNP,  D-Dimer No results found for this basename: DDIMER,  in the last 72 hours Hemoglobin A1C No results found for this basename: HGBA1C,  in the last 72 hours Fasting Lipid Panel No results found for this basename: CHOL, HDL, LDLCALC, TRIG, CHOLHDL, LDLDIRECT,  in the last 72 hours Thyroid Function Tests No results found for this basename: TSH, T4TOTAL, FREET3, T3FREE, THYROIDAB,  in the last 72 hours    ECG  None presently  Radiology/Studies  Dg Chest Port 1 View  01/17/2013  *RADIOLOGY REPORT*  Clinical Data: Respiratory failure  PORTABLE CHEST - 1 VIEW  Comparison: Yesterday  Findings: Tubular structures stable.  Bilateral consolidation right greater than left improved.  Normal heart size.  No pneumothorax.  IMPRESSION: Improved airspace disease.   Original Report Authenticated By: Jolaine Click, M.D.    Dg Chest Port 1 View  01/16/2013  **ADDENDUM** CREATED: 01/16/2013 14:23:00  This has been made a PRA  call report utilizing dashboard call feature.  **END ADDENDUM** SIGNED BY: Almedia Balls. Constance Goltz, M.D.   01/16/2013  *RADIOLOGY REPORT*  Clinical Data: Line placement.  PORTABLE CHEST - 1 VIEW  Comparison: None.  Findings: Left central line tip projects at the level of the distal superior vena cava.  Endotracheal tube tip 4 cm above the carina.  Nasogastric tube courses below the diaphragm.  The tip is not included on this exam.  No gross pneumothorax.  Heart slightly enlarged.  Diffuse air space disease greater on the right suggestive of pulmonary edema.  Infectious infiltrate not excluded.  Calcified aorta consistent with age advanced atherosclerotic type changes.  IMPRESSION: Left central line tip projects at the level of the distal superior vena cava.  Endotracheal tube tip 4 cm above the carina.  Nasogastric tube courses below the diaphragm.  The tip is not included on this exam.  Heart slightly enlarged.  Diffuse air space disease greater on the right suggestive of pulmonary edema.  Infectious infiltrate not excluded.  This is a call report.   Original Report Authenticated By: Lacy Duverney, M.D.     ASSESSMENT AND PLAN  1.  Out of hospital VF arrest with successful rescussitation 2.  No significant CAD 3.  Primary VF arrest 4.  Hypokalemia.    Replace K. EP consult on Monday.   Gradual withdrawal from sedation.   Wean norep as tolerated.    Signed, Shawnie Pons MD, [facility], FSCAI

## 2013-01-18 NOTE — Progress Notes (Signed)
Brief Nutrition Note/New TF  Intervention:  - If patient to remain intubated, recommend advance Jevity 1.2 by 10 ml every 4 hours to a goal rate of 40 ml/hr with 30 ml Prostat BID. This would provide 1352 kcal, 83 gm protein, and 775 ml free water.  2. Given pt's ETOH abuse, patient is at increased risk for refeeding syndrome, recommend monitor Mag, Phos and K+ x 3 days.  3. RD will continue to follow    Full assessment completed 3/21.   Patient remains intubated. Hypothermia protocol initiated. Patient is now re-warmed. TF has been initiated. Patient remains at risk for refeeding syndrome due to ETOH abuse. Phos and Mag WNL, K+ levels low.   TF: Jevity 1.2 at 20 ml/hr with no orders to advance. This provides 576 kcal, 27 g protein, and 387 ml free water.   MV: 6.7 Temp: 37.5  Re-estimated needs:  1400 kcal 70-85 g >1.3 L  Phosphorus  Date Value Range Status  01/18/2013 3.5  2.3 - 4.6 mg/dL Final   Potassium  Date Value Range Status  01/18/2013 3.0* 3.5 - 5.1 mEq/L Final   Magnesium  Date Value Range Status  01/18/2013 1.8  1.5 - 2.5 mg/dL Final   Linnell Fulling, RD, LDN Pager #: [phone] After-Hours Pager #: [phone]

## 2013-01-18 NOTE — Progress Notes (Signed)
PCCM NOTE  Date of admission: 3/20 Pt Profile:  63 F with no known PMH but reportedly with hx of drug abuse brought to [facility] ED from home via EMS after witnessed arrest. 5 mins of no CPR prior to EMS arrival. approx 10 mins of CPR before ROSC. Initial rhythm reportedly VF. Initial ECG revealed LBBB. Emergent cath > no CAD noted. Hypothermia protocol initiated in ED. Gastric contents noted in oropharynx upon intubation and food particulate matter suctioned from ETT  Hospital Course: 3/19 Admit after OOH arrest. Intubated. Hypothermia protocol initiated 3/19 cardiac cath: no significant CAD   LINES/TUBES: Radial A-line 3/20 >> out ETT 3/20 >>  L [place] CVL 3/20 >>    MICRO:  Resp 3/20 >> multiple organisms on GS >>   ABX:  Unasyn 3/20 >>   PROPHYLAXIS:  DVT: SQ heparin  SUP: PPI  CONSULTANTS:  LHC Cards 3/20   SUBJECTIVE/OVERNIGHT/INTERVAL HX Weaning pressors.  EXAM:  Gen: WDWN, agitated at times  HEENT: [place]/AT, PERRL Neck: no JVD noted Lungs: coarse bilateral, prolonged exp phase Cardiovascular: Tachy, regular, I/VI syst M @ LLSB Abdomen: mildly distended, soft, NT, diminished BS Ext: warm, no edema Neuro: No spontaneous movement prior to NMBs, PERRL Skin: no rashes, no track marks noted  DATA:  Recent Labs Lab 01/17/13 2330 01/18/13 0349 01/18/13 0800  NA 143 142 145  K 3.4* 3.2* 3.0*  CL 111 110 113*  CO2 21 21 21   BUN 7 7 6   CREATININE 0.45* 0.50 0.51  GLUCOSE 109* 96 93    Recent Labs Lab 01/16/13 1133  01/16/13 1758 01/16/13 1957 01/17/13 0443  HGB 11.4*  < > 13.3 12.6 11.2*  HCT 34.6*  < > 39.0 37.0 33.0*  WBC 15.1*  --   --   --  13.0*  PLT 145*  --   --   --  155  < > = values in this interval not displayed.  ABG    Component Value Date/Time   PHART 7.358 01/16/2013 1633   PCO2ART 36.3 01/16/2013 1633   PO2ART 133.0* 01/16/2013 1633   HCO3 21.0 01/16/2013 1633   TCO2 24 01/16/2013 1957   ACIDBASEDEF 5.0* 01/16/2013 1633   O2SAT 99.0  01/16/2013 1633      Cardiac Panel (last 3 results) No results found for this basename: CKTOTAL, CKMB, TROPONINI, RELINDX,  in the last 72 hours   PCXR: R>L airspace disease; ett and CVL in good position   IMPRESSION:   Principal Problem:   Cardiac arrest Active Problems:   Ventricular fibrillation   Acute respiratory failure with hypoxia   Pulmonary edema   Aspiration into respiratory tract   Hypertension   Polysubstance abuse   Alcohol abuse   Smoker   Hypokalemia   Left bundle branch block   Thrombocytopenia, mild   Hyperglycemia, no hx of DM   Anoxic encephalopathy  .=  ASSESSMENT / PLAN:  PULMONARY A: VDRF post arrest Pulmonary edema pattern on CXR - cardiogenic vs non-cardiogenic (aspiration) -01/17/13- not ready fro sbt P:   Cont full support Vent bundle implemented Daily WUA/SBT if indicated   CARDIOVASCULAR A: Cardiac arrest, initial rhythm 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 No CAD by [facility] 3/20  P:  Cards following   RENAL A:   Hypokalemia P: Replace and recheck   GASTROINTESTINAL A:  P:   Begin nutrition 3/22   HEMATOLOGIC A:  Mild thrombocytopenia improving       Mild anemia  P:  Monitor plts while on SQ heparin   INFECTIOUS A:  Suspected aspiration P:   Micro and abx as above   ENDOCRINE A:  Hyperglycemia without prior hx of DM P:   SSI ordered  NEUROLOGIC A:  Polysubstance abuse inc cocaine and EtOH S/p rewarming  P:   Trial precedex  Decrease other sedating gtts Supportive care  I have personally obtained a history, examined the patient, evaluated laboratory and imaging results, formulated the assessment and plan and placed orders. CRITICAL CARE: The patient is critically ill with multiple organ systems failure and requires high complexity decision making for assessment and support, frequent evaluation and titration of therapies, application of advanced monitoring technologies and extensive  interpretation of multiple databases. Critical Care Time devoted to patient care services described in this note is 35 minutes.     Family updated 3/22  Billy Fischer, MD ; [facility] service Mobile [phone].  After 5:30 PM or weekends, call [phone]

## 2013-01-18 NOTE — Progress Notes (Signed)
Pt is opening eyes to verbal stimuli. Tracking with eyes. Lifting arms toward ETT and sitting up in bed. Biting on ETT.Marland Kitchen Unable to follow commands at this time. Fentanyl @ 300 mcg, Versed @ 8 mcg. Will notify Dr. Frederico Hamman to continue sedation. Daughter at bedside and aware. Will monitor. MAP goal >65 per Dr. Frederico Hamman.

## 2013-01-18 NOTE — Progress Notes (Signed)
Pt became very agitated pulling at tubes when being turned.  Unable to follow commands. HR 170's SVT. Given Fent bolus total 200 mcg. Ativan 1 mg IV given. Pt calm at this time. Elink notified. Will monitor

## 2013-01-18 NOTE — Progress Notes (Signed)
Versed 40ml wasted by RN at bedside

## 2013-01-18 NOTE — Progress Notes (Signed)
The patient waking up; biting the tube; cont sedation;

## 2013-01-19 ENCOUNTER — Inpatient Hospital Stay (HOSPITAL_COMMUNITY): Payer: 59

## 2013-01-19 DIAGNOSIS — I428 Other cardiomyopathies: Secondary | ICD-10-CM | POA: Diagnosis present

## 2013-01-19 DIAGNOSIS — E876 Hypokalemia: Secondary | ICD-10-CM

## 2013-01-19 LAB — BASIC METABOLIC PANEL
BUN: 7 mg/dL (ref 6–23)
BUN: 8 mg/dL (ref 6–23)
Chloride: 108 mEq/L (ref 96–112)
Creatinine, Ser: 0.58 mg/dL (ref 0.50–1.10)
GFR calc Af Amer: >90 mL/min (ref >90)
GFR calc non Af Amer: >90 mL/min (ref >90)
Glucose, Bld: 114 mg/dL — ABNORMAL HIGH (ref 70–99)
Glucose, Bld: 131 mg/dL — ABNORMAL HIGH (ref 70–99)
Potassium: 3.5 mEq/L (ref 3.5–5.1)
Potassium: 3.6 mEq/L (ref 3.5–5.1)

## 2013-01-19 LAB — CBC
HCT: 28.2 % — ABNORMAL LOW (ref 36.0–46.0)
HCT: 29.4 % — ABNORMAL LOW (ref 36.0–46.0)
Hemoglobin: 9.4 g/dL — ABNORMAL LOW (ref 12.0–15.0)
Hemoglobin: 10 g/dL — ABNORMAL LOW (ref 12.0–15.0)
MCH: 30.5 pg (ref 26.0–34.0)
MCHC: 33.3 g/dL (ref 30.0–36.0)
MCHC: 34 g/dL (ref 30.0–36.0)
RBC: 3.09 MIL/uL — ABNORMAL LOW (ref 3.87–5.11)
RDW: 14.4 % (ref 11.5–15.5)

## 2013-01-19 LAB — GLUCOSE, CAPILLARY
Glucose-Capillary: 118 mg/dL — ABNORMAL HIGH (ref 70–99)
Glucose-Capillary: 125 mg/dL — ABNORMAL HIGH (ref 70–99)
Glucose-Capillary: 130 mg/dL — ABNORMAL HIGH (ref 70–99)
Glucose-Capillary: 151 mg/dL — ABNORMAL HIGH (ref 70–99)

## 2013-01-19 MED ORDER — FAMOTIDINE 40 MG/5ML PO SUSR
20.0000 mg | Freq: Two times a day (BID) | ORAL | Status: DC
  Administered 2013-01-19 – 2013-01-23 (×9): 20 mg via ORAL
  Filled 2013-01-19 (×10): qty 2.5

## 2013-01-19 MED ORDER — METOPROLOL TARTRATE 1 MG/ML IV SOLN
2.5000 mg | INTRAVENOUS | Status: DC | PRN
  Administered 2013-01-19: 5 mg via INTRAVENOUS
  Filled 2013-01-19: qty 5

## 2013-01-19 MED ORDER — METOPROLOL TARTRATE 25 MG/10 ML ORAL SUSPENSION
12.5000 mg | Freq: Two times a day (BID) | ORAL | Status: DC
  Administered 2013-01-19 – 2013-01-20 (×4): 12.5 mg
  Filled 2013-01-19 (×6): qty 5

## 2013-01-19 MED ORDER — POTASSIUM CHLORIDE 20 MEQ/15ML (10%) PO LIQD
40.0000 meq | Freq: Two times a day (BID) | ORAL | Status: AC
  Administered 2013-01-19 (×2): 40 meq
  Filled 2013-01-19 (×2): qty 30

## 2013-01-19 MED ORDER — LORAZEPAM 2 MG/ML IJ SOLN
2.0000 mg | INTRAMUSCULAR | Status: DC | PRN
  Administered 2013-01-20 – 2013-01-21 (×8): 2 mg via INTRAVENOUS
  Filled 2013-01-19 (×8): qty 1

## 2013-01-19 MED ORDER — FUROSEMIDE 10 MG/ML IJ SOLN
40.0000 mg | Freq: Once | INTRAMUSCULAR | Status: AC
  Administered 2013-01-19: 40 mg via INTRAVENOUS
  Filled 2013-01-19: qty 4

## 2013-01-19 NOTE — Progress Notes (Signed)
Pt calm and sedated at this time. Will monitor.

## 2013-01-19 NOTE — Progress Notes (Signed)
PCCM NOTE  Date of admission: 3/20 Pt Profile:  11 F with no known PMH but reportedly with hx of drug abuse brought to [facility] ED from home via EMS after witnessed arrest. 5 mins of no CPR prior to EMS arrival. approx 10 mins of CPR before ROSC. Initial rhythm reportedly VF. Initial ECG revealed LBBB. Emergent cath > no CAD noted. Hypothermia protocol initiated in ED. Gastric contents noted in oropharynx upon intubation and food particulate matter suctioned from ETT  Hospital Course: 3/19 Admit after OOH arrest. Intubated. Hypothermia protocol initiated 3/19 cardiac cath: no significant CAD   LINES/TUBES: Radial A-line 3/20 >> out ETT 3/20 >>  L [place] CVL 3/20 >>    MICRO:  Resp 3/20 >> NOF  ABX:  Unasyn 3/20 >>   PROPHYLAXIS:  DVT: SQ heparin  SUP: PPI  CONSULTANTS:  LHC Cards 3/20   SUBJECTIVE/OVERNIGHT/INTERVAL HX Off pressors, failed SBT   EXAM:  Gen: WDWN, agitated at times  HEENT: [place]/AT, PERRL Neck: no JVD noted Lungs: coarse bilateral, prolonged exp phase Cardiovascular: Tachy, regular, IV/VI syst M @ LLSB Abdomen: mildly distended, soft, NT, diminished BS Ext: warm, no edema Neuro: agitated at times  Skin: no rashes, no track marks noted  DATA:  Recent Labs Lab 01/18/13 0349 01/18/13 0800 01/19/13 0457  NA 142 145 141  K 3.2* 3.0* 3.5  CL 110 113* 108  CO2 21 21 22   BUN 7 6 7   CREATININE 0.50 0.51 0.56  GLUCOSE 96 93 114*    Recent Labs Lab 01/16/13 1133  01/16/13 1957 01/17/13 0443 01/19/13 0457  HGB 11.4*  < > 12.6 11.2* 9.4*  HCT 34.6*  < > 37.0 33.0* 28.2*  WBC 15.1*  --   --  13.0* 9.3  PLT 145*  --   --  155 128*  < > = values in this interval not displayed.  ABG    Component Value Date/Time   PHART 7.358 01/16/2013 1633   PCO2ART 36.3 01/16/2013 1633   PO2ART 133.0* 01/16/2013 1633   HCO3 21.0 01/16/2013 1633   TCO2 24 01/16/2013 1957   ACIDBASEDEF 5.0* 01/16/2013 1633   O2SAT 99.0 01/16/2013 1633   Cardiac Panel (last 3  results) No results found for this basename: CKTOTAL, CKMB, TROPONINI, RELINDX,  in the last 72 hours   PCXR: persistent R>L airspace disease; ett and CVL in good position    IMPRESSION:   Principal Problem:   Cardiac arrest Active Problems:   Ventricular fibrillation   Acute respiratory failure with hypoxia   Pulmonary edema   Aspiration into respiratory tract   Hypertension   Polysubstance abuse   Alcohol abuse   Smoker   Hypokalemia   Left bundle branch block   Thrombocytopenia, mild   Hyperglycemia, no hx of DM   Anoxic encephalopathy   Mitral regurgitation   Nonischemic cardiomyopathy   ASSESSMENT / PLAN:  PULMONARY A: VDRF post arrest Pulmonary edema pattern on CXR - cardiogenic vs non-cardiogenic (aspiration)-->looking more like aspiration  3/23: failed SBT, CXR a little worse w/ R>L airspace disease  P:   Cont full support Vent bundle implemented Daily WUA/SBT if indicated See ID section  Lasix X 1  CARDIOVASCULAR A: Cardiac arrest, initial rhythm 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 Has no obvious evidence of bleeding, could be dilutional   P:  Monitor plts while on SQ heparin Recheck cbc later to be sure plts and cbc holding. May need to stop heparin    INFECTIOUS A: aspiration PNA  P:   Micro and abx as above will complete 8d rx    ENDOCRINE A:  Hyperglycemia without prior hx of DM P:   SSI ordered   NEUROLOGIC A:  Polysubstance abuse inc cocaine and EtOH S/p rewarming  P:   Trial precedex  Will add thiamine and folate  Supportive care  I have personally obtained a history, examined the patient, evaluated laboratory and imaging results, formulated  the assessment and plan and placed orders. CRITICAL CARE: The patient is critically ill with multiple organ systems failure and requires high complexity decision making for assessment and support, frequent evaluation and titration of therapies, application of advanced monitoring technologies and extensive interpretation of multiple databases. Critical Care Time devoted to patient care services described in this note is 35 minutes.    Billy Fischer, MD ; [facility] [phone].  After 5:30 PM or weekends, call [phone]

## 2013-01-19 NOTE — Progress Notes (Signed)
Pt with intermittent agitation; waking up attempting to pull out ET tube; mitts in place bilateral hands; pt reorientation provided; IV fentanyl/precedex increased; PRN ativan given; VSS; will continue to monitor

## 2013-01-19 NOTE — Progress Notes (Signed)
Dr. Sung Amabile in when we tried wean.  Pt becoming aggitated and placed back on full support

## 2013-01-19 NOTE — Progress Notes (Signed)
Pt very agitated, trying to get out of bed. Combative toward staff. HR 180's.  Unable to calm pt and reorient. Fentanyl bolus given. Prn ativan given. Dr. Tula Nakayama notified.

## 2013-01-19 NOTE — Progress Notes (Addendum)
Patient Name: [name] Date of Encounter: 01/19/2013     Principal Problem:   Cardiac arrest Active Problems:   Ventricular fibrillation   Acute respiratory failure with hypoxia   Pulmonary edema   Aspiration into respiratory tract   Hypertension   Polysubstance abuse   Alcohol abuse   Smoker   Hypokalemia   Left bundle branch block   Thrombocytopenia, mild   Hyperglycemia, no hx of DM   Anoxic encephalopathy    SUBJECTIVE  Came up from sedation with agitation.  Now fully sedated.    CURRENT MEDS . ampicillin-sulbactam (UNASYN) IV  1.5 g Intravenous Q8H  . antiseptic oral rinse  15 mL Mouth Rinse QID  . chlorhexidine  15 mL Mouth Rinse BID  . heparin  5,000 Units Subcutaneous Q8H  . insulin aspart  0-9 Units Subcutaneous Q4H  . pantoprazole (PROTONIX) IV  40 mg Intravenous Q24H    OBJECTIVE  Filed Vitals:   01/19/13 0400 01/19/13 0500 01/19/13 0600 01/19/13 0700  BP: 111/77 117/81 123/91 126/85  Pulse:      Temp: 97.4 F (36.3 C)     TempSrc: Oral     Resp: 14 14 14 14   Height:      Weight:      SpO2: 100% 100% 100% 100%    Intake/Output Summary (Last 24 hours) at 01/19/13 0824 Last data filed at 01/19/13 0700  Gross per 24 hour  Intake 2153.92 ml  Output    860 ml  Net 1293.92 ml   Filed Weights   01/16/13 1239 01/16/13 1430 01/19/13 0300  Weight: 122 lb (55.339 kg) 127 lb 13.9 oz (58 kg) 130 lb 1.1 oz (59 kg)    PHYSICAL EXAM  General: intubated and sedated at present.  HEENT:  Normal.  Intubation  Neck: Supple without bruits or JVD. Lungs:  Resp regular and unlabored, CTA. Heart: RRR.  Currently has harsh apical greater than LVOT murmur.  3/6.   Abdomen: Soft, non-tender, non-distended, BS + x 4.  Extremities: No clubbing, cyanosis or edema. DP/PT/Radials 2+ and equal bilaterally.  Accessory Clinical Findings  CBC  Recent Labs  01/16/13 1133  01/17/13 0443 01/19/13 0457  WBC 15.1*  --  13.0* 9.3  NEUTROABS 5.1  --   --    --   HGB 11.4*  < > 11.2* 9.4*  HCT 34.6*  < > 33.0* 28.2*  MCV 92.8  --  89.7 91.3  PLT 145*  --  155 128*  < > = values in this interval not displayed. Basic Metabolic Panel  Recent Labs  01/17/13 1031  01/18/13 0349 01/18/13 0800 01/19/13 0457  NA 142  < > 142 145 141  K 4.1  < > 3.2* 3.0* 3.5  CL 111  < > 110 113* 108  CO2 21  < > 21 21 22   GLUCOSE 158*  < > 96 93 114*  BUN 10  < > 7 6 7   CREATININE 0.45*  < > 0.50 0.51 0.56  CALCIUM 8.4  < > 8.2* 7.3* 8.5  MG 1.7  --  1.8  --   --   PHOS 2.0*  --  3.5  --   --   < > = values in this interval not displayed. Liver Function Tests  Recent Labs  01/16/13 1133  AST 361*  ALT 213*  ALKPHOS 81  BILITOT 0.2*  PROT 7.1  ALBUMIN 3.3*   No results found for this basename: LIPASE, AMYLASE,  in the last 72 hours Cardiac Enzymes No results found for this basename: CKTOTAL, CKMB, CKMBINDEX, TROPONINI,  in the last 72 hours BNP No components found with this basename: POCBNP,  D-Dimer No results found for this basename: DDIMER,  in the last 72 hours Hemoglobin A1C No results found for this basename: HGBA1C,  in the last 72 hours Fasting Lipid Panel No results found for this basename: CHOL, HDL, LDLCALC, TRIG, CHOLHDL, LDLDIRECT,  in the last 72 hours Thyroid Function Tests No results found for this basename: TSH, T4TOTAL, FREET3, T3FREE, THYROIDAB,  in the last 72 hours  TELE  See strips  ECG  NSR.  LBBB  Radiology/Studies  Dg Chest [facility] 1 View  01/19/2013  *RADIOLOGY REPORT*  Clinical Data: Respiratory failure.  PORTABLE CHEST - 1 VIEW  Comparison: 01/17/2013  Findings: Endotracheal tube, nasogastric tube, and left subclavian central line are stable in position.  Perihilar interstitial and airspace edema or infiltrates, right greater than left, not significantly changed since previous exam.  There is blunting of the right lateral costophrenic angle suggesting small effusion. Heart size upper limits normal for  technique.  IMPRESSION:  1.  Stable asymmetric edema/infiltrates. 2. Support hardware stable in position.   Original Report Authenticated By: D. Andria Rhein, MD    Dg Chest Port 1 View  01/17/2013  *RADIOLOGY REPORT*  Clinical Data: Respiratory failure  PORTABLE CHEST - 1 VIEW  Comparison: Yesterday  Findings: Tubular structures stable.  Bilateral consolidation right greater than left improved.  Normal heart size.  No pneumothorax.  IMPRESSION: Improved airspace disease.   Original Report Authenticated By: Jolaine Click, M.D.    Dg Chest Port 1 View  01/16/2013  **ADDENDUM** CREATED: 01/16/2013 14:23:00  This has been made a PRA call report utilizing dashboard call feature.  **END ADDENDUM** SIGNED BY: Almedia Balls. Constance Goltz, M.D.   01/16/2013  *RADIOLOGY REPORT*  Clinical Data: Line placement.  PORTABLE CHEST - 1 VIEW  Comparison: None.  Findings: Left central line tip projects at the level of the distal superior vena cava.  Endotracheal tube tip 4 cm above the carina.  Nasogastric tube courses below the diaphragm.  The tip is not included on this exam.  No gross pneumothorax.  Heart slightly enlarged.  Diffuse air space disease greater on the right suggestive of pulmonary edema.  Infectious infiltrate not excluded.  Calcified aorta consistent with age advanced atherosclerotic type changes.  IMPRESSION: Left central line tip projects at the level of the distal superior vena cava.  Endotracheal tube tip 4 cm above the carina.  Nasogastric tube courses below the diaphragm.  The tip is not included on this exam.  Heart slightly enlarged.  Diffuse air space disease greater on the right suggestive of pulmonary edema.  Infectious infiltrate not excluded.  This is a call report.   Original Report Authenticated By: Lacy Duverney, M.D.    I/0 up about 1300 yesterday.     ASSESSMENT AND PLAN  1.  OOH VF arrest of uncertain etiology 2.  LV dysfunction with baseline LBBB/no CAD---findings suggest NICM 3.  History of  substance abuse.   4.  Hypokalemia. 5.  Apical murmur with MR on echo  -- suspect will need TEE eventually to assess MR.    Replace K.  Monitor.  She will need EP evaluation, and I suspect device given documented arrest.   May consider low dose beta blocker when she is awake.    Signed, Shawnie Pons MD, [facility], FSCAI

## 2013-01-20 ENCOUNTER — Inpatient Hospital Stay (HOSPITAL_COMMUNITY): Payer: 59

## 2013-01-20 DIAGNOSIS — F17200 Nicotine dependence, unspecified, uncomplicated: Secondary | ICD-10-CM

## 2013-01-20 DIAGNOSIS — I428 Other cardiomyopathies: Secondary | ICD-10-CM

## 2013-01-20 LAB — POCT I-STAT 3, ART BLOOD GAS (G3+)
Bicarbonate: 24.1 mEq/L — ABNORMAL HIGH (ref 20.0–24.0)
TCO2: 25 mmol/L (ref 0–100)
TCO2: 27 mmol/L (ref 0–100)
pCO2 arterial: 40.6 mmHg (ref 35.0–45.0)
pCO2 arterial: 43.6 mmHg (ref 35.0–45.0)
pH, Arterial: 7.381 (ref 7.350–7.450)
pH, Arterial: 7.378 (ref 7.350–7.450)
pO2, Arterial: 94 mmHg (ref 80.0–100.0)
pO2, Arterial: 68 mmHg — ABNORMAL LOW (ref 80.0–100.0)

## 2013-01-20 LAB — CBC
MCH: 30 pg (ref 26.0–34.0)
MCV: 87.5 fL (ref 78.0–100.0)
Platelets: 140 10*3/uL — ABNORMAL LOW (ref 150–400)
RDW: 14.6 % (ref 11.5–15.5)
WBC: 7.2 10*3/uL (ref 4.0–10.5)

## 2013-01-20 LAB — COMPREHENSIVE METABOLIC PANEL
AST: 36 U/L (ref 0–37)
Albumin: 2.6 g/dL — ABNORMAL LOW (ref 3.5–5.2)
Calcium: 8.8 mg/dL (ref 8.4–10.5)
Creatinine, Ser: 0.6 mg/dL (ref 0.50–1.10)
Sodium: 141 mEq/L (ref 135–145)

## 2013-01-20 LAB — GLUCOSE, CAPILLARY: Glucose-Capillary: 147 mg/dL — ABNORMAL HIGH (ref 70–99)

## 2013-01-20 MED ORDER — POTASSIUM CHLORIDE 20 MEQ/15ML (10%) PO LIQD
40.0000 meq | Freq: Two times a day (BID) | ORAL | Status: DC
  Administered 2013-01-20 – 2013-01-23 (×7): 40 meq
  Filled 2013-01-20 (×8): qty 30

## 2013-01-20 MED ORDER — FUROSEMIDE 10 MG/ML IJ SOLN
40.0000 mg | Freq: Three times a day (TID) | INTRAMUSCULAR | Status: DC
  Administered 2013-01-20 – 2013-01-21 (×3): 40 mg via INTRAVENOUS
  Filled 2013-01-20 (×7): qty 4

## 2013-01-20 MED ORDER — POTASSIUM CHLORIDE 20 MEQ PO PACK: 20.0000 meq | PACK | Freq: Once | ORAL | Status: DC

## 2013-01-20 MED ORDER — LISINOPRIL 2.5 MG PO TABS
2.5000 mg | ORAL_TABLET | Freq: Two times a day (BID) | ORAL | Status: DC
  Administered 2013-01-20 – 2013-01-22 (×3): 2.5 mg via ORAL
  Filled 2013-01-20 (×8): qty 1

## 2013-01-20 MED ORDER — POTASSIUM CHLORIDE CRYS ER 20 MEQ PO TBCR
20.0000 meq | EXTENDED_RELEASE_TABLET | Freq: Once | ORAL | Status: AC
  Administered 2013-01-20: 20 meq via ORAL
  Filled 2013-01-20: qty 1

## 2013-01-20 MED ORDER — FUROSEMIDE 10 MG/ML IJ SOLN
40.0000 mg | INTRAMUSCULAR | Status: DC
  Administered 2013-01-20: 40 mg via INTRAVENOUS

## 2013-01-20 NOTE — Progress Notes (Signed)
NUTRITION FOLLOW UP  Intervention:   1. Recommend monitor Mag and Phos for signs of refeeding syndrome.  2. To optimize pt protein intake: Recommend decrease Jevity 1.2 to  A goal rate of 45 ml/hr and add 30 ml Pro-stat once daily. This EN regimen would provide 1396 kcal, 75 gm protein, and 871 ml free water.  3. RD will continue to follow    Nutrition Dx:   Inadequate oral intake related to inability to eat as evidenced by NPO diet.    Goal:   Pt to meet >/=90% estimated nutrition needs. Met   Monitor:   Vent status, weight trends, labs, I/O's, TF tolerance   Assessment:   Remains intubated, TF was initiated on 3/23. Jevity 1.2 @ 20 ml/hr and advance to goal rate of 50 ml/hr. This provides 1440 kcal, 67 gm protein, and 968 ml free water.    Given pt's known substance abuse, pt is at increased risk for refeeding syndrome. Mag and Phos were WNL prior to TF starting, has now been checked since 3/22. K+ low on 3/22, repleted and remains WNL.      Height: Ht Readings from Last 1 Encounters:  01/16/13 5\' 4"  (1.626 m)    Weight Status:   Wt Readings from Last 1 Encounters:  01/20/13 133 lb 9.6 oz (60.6 kg)   Patient is currently intubated on ventilator support.  MV: 7.2 Temp:Temp (24hrs), Avg:96.8 F (36 C), Min:92.7 F (33.7 C), Max:99.7 F (37.6 C)  Re-estimated needs:  Kcal: 1458 Protein: >/= 72 gm Fluid: >/= 1.5 L   Skin: intact    Diet Order:   NPO   Intake/Output Summary (Last 24 hours) at 01/20/13 0951 Last data filed at 01/20/13 0900  Gross per 24 hour  Intake 2447.93 ml  Output   2910 ml  Net -462.07 ml  + 3.9 L this admission   Last BM: PTA    Labs:   Recent Labs Lab 01/17/13 0443 01/17/13 1031  01/18/13 0349  01/19/13 0457 01/19/13 1600 01/20/13 0400  NA 143 142  < > 142  < > 141 141 141  K 3.3* 4.1  < > 3.2*  < > 3.5 3.6 4.1  CL 109 111  < > 110  < > 108 107 107  CO2 20 21  < > 21  < > 22 24 24   BUN 12 10  < > 7  < > 7 8 9    CREATININE 0.44* 0.45*  < > 0.50  < > 0.56 0.58 0.60  CALCIUM 8.2* 8.4  < > 8.2*  < > 8.5 9.2 8.8  MG  --  1.7  --  1.8  --   --   --   --   PHOS  --  2.0*  --  3.5  --   --   --   --   GLUCOSE 177* 158*  < > 96  < > 114* 131* 166*  < > = values in this interval not displayed.  CBG (last 3)   Recent Labs  01/19/13 2332 01/20/13 0338 01/20/13 0721  GLUCAP 151* 147* 176*    Scheduled Meds: . ampicillin-sulbactam (UNASYN) IV  1.5 g Intravenous Q8H  . antiseptic oral rinse  15 mL Mouth Rinse QID  . chlorhexidine  15 mL Mouth Rinse BID  . famotidine  20 mg Oral BID  . furosemide  40 mg Intravenous Q8H  . heparin  5,000 Units Subcutaneous Q8H  . insulin aspart  0-9  Units Subcutaneous Q4H  . metoprolol tartrate  12.5 mg Per Tube BID  . potassium chloride  40 mEq Per Tube BID    Continuous Infusions: . sodium chloride 10 mL/hr at 01/19/13 0800  . dexmedetomidine 1.2 mcg/kg/hr (01/20/13 0800)  . feeding supplement (JEVITY 1.2 CAL) 20 mL/hr at 01/18/13 1330  . fentaNYL infusion INTRAVENOUS 200 mcg/hr (01/20/13 0800)    Clarene Duke RD, LDN Pager [phone] After Hours pager [phone]

## 2013-01-20 NOTE — Progress Notes (Addendum)
Patient Name: [name] Date of Encounter: 01/20/2013  Principal Problem:   Cardiac arrest Active Problems:   Ventricular fibrillation   Acute respiratory failure with hypoxia   Pulmonary edema   Aspiration into respiratory tract   Hypertension   Polysubstance abuse   Alcohol abuse   Smoker   Hypokalemia   Left bundle branch block   Thrombocytopenia, mild   Hyperglycemia, no hx of DM   Anoxic encephalopathy   Mitral regurgitation   Nonischemic cardiomyopathy   Cardiomyopathy    SUBJECTIVE: Intubated, sedated, responds to stimulus  OBJECTIVE Filed Vitals:   01/20/13 0403 01/20/13 0500 01/20/13 0600 01/20/13 0715  BP: 105/62 106/64 103/71 106/65  Pulse: 82   88  Temp:  98.1 F (36.7 C) 97.9 F (36.6 C) 96.8 F (36 C)  TempSrc:  Core (Comment) Core (Comment) Core (Comment)  Resp: 14 18 14 14   CVP 12     Weight: 133 (122 on admit)     SpO2: 100% 100% 100% 100%    Intake/Output Summary (Last 24 hours) at 01/20/13 0810 Last data filed at 01/20/13 0600  Gross per 24 hour  Intake 2227.61 ml  Output   3010 ml  Net -782.39 ml   Filed Weights   01/16/13 1430 01/19/13 0300 01/20/13 0400  Weight: 127 lb 13.9 oz (58 kg) 130 lb 1.1 oz (59 kg) 133 lb 9.6 oz (60.6 kg)    PHYSICAL EXAM General: Well developed, well nourished, female, sedated on vent Head: Normocephalic, atraumatic.  Neck: Supple without bruits, JVD at 9 cm. Lungs:  Resp regular and unlabored, rales bases Heart: RRR, S1, S2, no S3, S4, 3/6 murmur; no rub. Abdomen: Soft, non-tender, non-distended, BS + x 4.  Extremities: No clubbing, cyanosis, no edema.  Neuro: Sedated. Moves extremities in response to stimulus   LABS: CBC: Recent Labs  01/19/13 1600 01/20/13 0400  WBC 8.8 7.2  HGB 10.0* 9.4*  HCT 29.4* 27.4*  MCV 89.6 87.5  PLT 138* 140*   Basic Metabolic Panel: Recent Labs  01/17/13 1031  01/18/13 0349  01/19/13 1600 01/20/13 0400  NA 142  < > 142  < > 141 141  K 4.1  < >  3.2*  < > 3.6 4.1  CL 111  < > 110  < > 107 107  CO2 21  < > 21  < > 24 24  GLUCOSE 158*  < > 96  < > 131* 166*  BUN 10  < > 7  < > 8 9  CREATININE 0.45*  < > 0.50  < > 0.58 0.60  CALCIUM 8.4  < > 8.2*  < > 9.2 8.8  MG 1.7  --  1.8  --   --   --   PHOS 2.0*  --  3.5  --   --   --   < > = values in this interval not displayed. Liver Function Tests: Recent Labs  01/20/13 0400  AST 36  ALT 56*  ALKPHOS 63  BILITOT 0.6  PROT 6.4  ALBUMIN 2.6*   BNP: Pro B Natriuretic peptide (BNP)  Date/Time Value Range Status  01/20/2013  4:00 AM 1935.0* 0 - 125 pg/mL Final   TELE:  SR with PVCs; ST with stimulation   ECG: 20-Jan-2013 07:04:51 [facility]-CCU ROUTINE RECORD Normal sinus rhythm Left bundle branch block Abnormal ECG 38mm/s 56mm/mV 100Hz  8.0.1 12SL 241 HD CID: 1 Referred by: Unconfirmed Vent. rate 78 BPM PR interval 190 ms  QRS duration 140 ms QT/QTc 486/554 ms P-R-T axes 28 15 71  Radiology/Studies: Dg Chest Port 1 View 01/20/2013  *RADIOLOGY REPORT*  Clinical Data: Follow up respiratory failure  PORTABLE CHEST - 1 VIEW  Comparison: 01/19/2013  Findings: Perihilar opacities, left greater than right. Given interval change, this appearance suggests mild interstitial edema.  Suspected small right pleural effusion.  No pneumothorax.  Cardiomegaly.  Endotracheal tube terminates 3 cm above the carina.  Enteric tube courses into the stomach.  Left subclavian venous catheter terminates at the cavoatrial junction.  IMPRESSION: Endotracheal tube terminates 3 cm above the carina.  Cardiomegaly with mild interstitial edema and suspected small right pleural effusion.   Original Report Authenticated By: Charline Bills, M.D.      Current Medications:  . ampicillin-sulbactam (UNASYN) IV  1.5 g Intravenous Q8H  . antiseptic oral rinse  15 mL Mouth Rinse QID  . chlorhexidine  15 mL Mouth Rinse BID  . famotidine  20 mg Oral BID  . heparin  5,000 Units Subcutaneous Q8H  .  insulin aspart  0-9 Units Subcutaneous Q4H  . metoprolol tartrate  12.5 mg Per Tube BID   . sodium chloride 10 mL/hr at 01/19/13 0800  . dexmedetomidine 1.2 mcg/kg/hr (01/20/13 1610)  . feeding supplement (JEVITY 1.2 CAL) 20 mL/hr at 01/18/13 1330  . fentaNYL infusion INTRAVENOUS 200 mcg/hr (01/20/13 9604)    ASSESSMENT AND PLAN: Principal Problem:   Cardiac arrest Active Problems:   Ventricular fibrillation   Acute respiratory failure with hypoxia   Pulmonary edema   Aspiration into respiratory tract   Hypertension   Polysubstance abuse   Alcohol abuse   Smoker   Hypokalemia   Left bundle branch block   Thrombocytopenia, mild   Hyperglycemia, no hx of DM   Anoxic encephalopathy   Mitral regurgitation   Nonischemic cardiomyopathy   Cardiomyopathy  1. OOH VF arrest of uncertain etiology. Monitor. She will need EP evaluation, and probably a device given documented arrest once she recovers from acute event.  2. LV dysfunction with baseline LBBB/no CAD---findings suggest NICM - CVP up, add Lasix 40 mg IV q 8 hr and follow I/O, weights, renal function.  3. History of substance abuse. UDS this admission negative except for benzos (given 2 hr before UDS performed)  4. Hypokalemia. supp and improved    5. Apical murmur with MR on echo -- suspect will need TEE eventually to assess MR. Murmur seems louder than on initial evaluation. Timing of TEE or repeat echo per MD  6. Tachycardia -  Add low dose beta blocker when BP will tolerate. Since she needs Lasix, will hold off for now.  Signed, Theodore Demark , PA-C 8:10 AM 01/20/2013  Patient seen with PA, agree with the above note.  1. CHF: Acute on chronic systolic CHF, EF 54% (nonischemic cardiomyopathy).  Urine drug screen negative.  She is volume overloaded with probably an S3.  CVP 12+. - Lasix 40 mg IV every 8 hrs - Replace K - Add lisinopril 2.5 mg bid.  - Check co-ox, TSH, SPEP - Would get cardiac MRI prior to discharge  to assess for infiltrative disease.  2. Mitral regurgitation: Prominent murmur.  Read as moderate on initial echo.  May be functional.  Repeat limited echo when optimized prior to discharge to reassess MR.  3. Cardiac arrest: If she has neurologic recovery, which it looks like she will, will need ICD prior to discharge.   [name] 01/20/2013 1:12 PM

## 2013-01-20 NOTE — Progress Notes (Signed)
0820:  Inc FIO2 100% pt desat during suction with good waveform.  Wakeup assessment was not tolerated. Inc ve 17.5, Inc HR 140's, Inc RR .  Obtained large amount of tan bld tinged thick secretions.  Inc Peep to 10 after inc in FIO2 did not help. After 10 min pt then compensated.  ABG to follow.  RT to adjust settings back to baseline as tolerated

## 2013-01-20 NOTE — Progress Notes (Signed)
PCCM NOTE  Date of admission: 3/20 Pt Profile:  72 F with no known PMH but reportedly with hx of drug abuse brought to [facility] ED from home via EMS after witnessed arrest. 5 mins of no CPR prior to EMS arrival. approx 10 mins of CPR before ROSC. Initial rhythm reportedly VF. Initial ECG revealed LBBB. Emergent cath > no CAD noted. Hypothermia protocol initiated in ED. Gastric contents noted in oropharynx upon intubation and food particulate matter suctioned from ETT  Hospital Course: 3/19 Admit after OOH arrest. Intubated. Hypothermia protocol initiated 3/19 cardiac cath: no significant CAD   LINES/TUBES: Radial A-line 3/20 >> out ETT 3/20 >>  L [place] CVL 3/20 >>    MICRO:  Resp 3/20 >> NOF  ABX:  Unasyn 3/20 >>   PROPHYLAXIS:  DVT: SQ heparin  SUP: PPI  CONSULTANTS:  LHC Cards 3/20   SUBJECTIVE/OVERNIGHT/INTERVAL HX 01/19/13: Off pressors, failed SBT 01/20/13: Followed some commands yesterday during SBT. Currently on precedex, fentanyl gtt and ativan prn. WUA results in significant agitatio followed by desaturation and HR to 140s, SBT results in desaturations and tachycardia and inc fin fio2 to 100%   EXAM:  Gen: WDWN, agitated at times  HEENT: [place]/AT, PERRL Neck: no JVD noted Lungs: coarse bilateral, prolonged exp phase Cardiovascular: Tachy, regular, IV/VI syst M @ LLSB Abdomen: mildly distended, soft, NT, diminished BS Ext: warm, no edema Neuro: agitated at times  Skin: no rashes, no track marks noted  DATA:  Recent Labs Lab 01/19/13 0457 01/19/13 1600 01/20/13 0400  NA 141 141 141  K 3.5 3.6 4.1  CL 108 107 107  CO2 22 24 24   BUN 7 8 9   CREATININE 0.56 0.58 0.60  GLUCOSE 114* 131* 166*    Recent Labs Lab 01/19/13 0457 01/19/13 1600 01/20/13 0400  HGB 9.4* 10.0* 9.4*  HCT 28.2* 29.4* 27.4*  WBC 9.3 8.8 7.2  PLT 128* 138* 140*    ABG    Component Value Date/Time   PHART 7.381 01/20/2013 0858   PCO2ART 40.6 01/20/2013 0858   PO2ART 94.0 01/20/2013  0858   HCO3 24.1* 01/20/2013 0858   TCO2 25 01/20/2013 0858   ACIDBASEDEF 1.0 01/20/2013 0858   O2SAT 97.0 01/20/2013 0858   Cardiac Panel (last 3 results) No results found for this basename: CKTOTAL, CKMB, TROPONINI, RELINDX,  in the last 72 hours   IMGAING x 48h Dg Chest Port 1 View  01/20/2013  *RADIOLOGY REPORT*  Clinical Data: Follow up respiratory failure  PORTABLE CHEST - 1 VIEW  Comparison: 01/19/2013  Findings: Perihilar opacities, left greater than right. Given interval change, this appearance suggests mild interstitial edema.  Suspected small right pleural effusion.  No pneumothorax.  Cardiomegaly.  Endotracheal tube terminates 3 cm above the carina.  Enteric tube courses into the stomach.  Left subclavian venous catheter terminates at the cavoatrial junction.  IMPRESSION: Endotracheal tube terminates 3 cm above the carina.  Cardiomegaly with mild interstitial edema and suspected small right pleural effusion.   Original Report Authenticated By: Charline Bills, M.D.    Dg Chest Port 1 View  01/19/2013  *RADIOLOGY REPORT*  Clinical Data: Respiratory failure.  PORTABLE CHEST - 1 VIEW  Comparison: 01/17/2013  Findings: Endotracheal tube, nasogastric tube, and left subclavian central line are stable in position.  Perihilar interstitial and airspace edema or infiltrates, right greater than left, not significantly changed since previous exam.  There is blunting of the right lateral costophrenic angle suggesting small effusion. Heart size upper limits normal  for technique.  IMPRESSION:  1.  Stable asymmetric edema/infiltrates. 2. Support hardware stable in position.   Original Report Authenticated By: D. Andria Rhein, MD      IMPRESSION:   Principal Problem:   Cardiac arrest Active Problems:   Ventricular fibrillation   Acute respiratory failure with hypoxia   Pulmonary edema   Aspiration into respiratory tract   Hypertension   Polysubstance abuse   Alcohol abuse   Smoker    Hypokalemia   Left bundle branch block   Thrombocytopenia, mild   Hyperglycemia, no hx of DM   Anoxic encephalopathy   Mitral regurgitation   Nonischemic cardiomyopathy   Cardiomyopathy   ASSESSMENT / PLAN:  PULMONARY  Recent Labs Lab 01/16/13 1242 01/16/13 1624 01/16/13 1633 01/16/13 1758 01/16/13 1957 01/20/13 0858  PHART 7.200*  --  7.358  --   --  7.381  PCO2ART 49.8*  --  36.3  --   --  40.6  PO2ART 53.0*  --  133.0*  --   --  94.0  HCO3 19.5*  --  21.0  --   --  24.1*  TCO2 21 24 25  22 24 24 25   O2SAT 79.0  --  99.0  --   --  97.0    A: VDRF post arrest Pulmonary edema pattern on CXR - cardiogenic vs non-cardiogenic (aspiration)-->looking more like aspiration  3/24 - looks like she is devloping flash pulm edema with SBT and wua  P:   Cont full support Vent bundle implemented Daily WUA/SBT if indicated/tolearated See ID section   CARDIOVASCULAR  No results found for this basename: TROPONINI,  in the last 168 hours  Recent Labs Lab 01/20/13 0400  PROBNP 1935.0*    A: Cardiac arrest, initial rhythm 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5 9.2 8.8  MG  --  1.7  --  1.8  --   --   --   --   PHOS  --  2.0*  --  3.5  --   --   --   --   < > = values in this interval not displayed.  A:   Normal lytes P: monitor  GASTROINTESTINAL  Recent Labs Lab  01/16/13 1133 01/16/13 1201 01/16/13 2001 01/20/13 0400  AST 361*  --   --  36  ALT 213*  --   --  56*  ALKPHOS 81  --   --  63  BILITOT 0.2*  --   --  0.6  PROT 7.1  --   --  6.4  ALBUMIN 3.3*  --   --  2.6*  INR  --  1.15 1.11  --  A: Mild transaminits P:   TFs initiated 3/23   HEMATOLOGIC  Recent Labs Lab 01/19/13 0457 01/19/13 1600 01/20/13 0400  HGB 9.4* 10.0* 9.4*  HCT 28.2* 29.4* 27.4*  WBC 9.3 8.8 7.2  PLT 128* 138* 140*    Recent Labs Lab 01/16/13 1201 01/16/13 2001  INR 1.15 1.11    A:  Mild thrombocytopenia--> a little worse        Mild anemia-->drifted from 11.2-->9.4. Has no obvious evidence of bleeding, could be dilutional   P:  Monitor plts while on SQ heparin PRBC for hgb < 8gm% (cardiac arrest)  INFECTIOUS No results found for this basename: LATICACIDVEN, PROCALCITON,  in the last 168 hours  A: aspiration PNA  P:   Micro and abx as above will complete 8d rx    ENDOCRINE CBG (last 3)   Recent Labs  01/19/13 2332 01/20/13 0338 01/20/13 0721  GLUCAP 151* 147* 176*     A:  Hyperglycemia without prior hx of DM P:   SSI ordered   NEUROLOGIC  A:  Polysubstance abuse inc cocaine and EtOH. S/p rewarming   3/24 - aggitated on wua but then develops flash pulm edema  P:   Continues sedation  thiamine and folate  Supportive care   GLOBAL 3/25 - family goes away during time of MD rounds. So no update   CRITICAL CARE: The patient is critically ill with multiple organ systems failure and requires high complexity decision making for assessment and support, frequent evaluation and titration of therapies, application of advanced monitoring technologies and extensive interpretation of multiple databases. Critical Care Time devoted to patient care services described in this note is 35 minutes.      Dr. Kalman Shan, M.D., [facility].C.P Pulmonary and Critical Care Medicine Staff Physician  [facility] [place] [facility]  and Critical Care Pager: [phone], If no answer or between  15:00h - 7:00h: call [phone]  01/20/2013 12:08 PM

## 2013-01-21 ENCOUNTER — Inpatient Hospital Stay (HOSPITAL_COMMUNITY): Payer: 59

## 2013-01-21 LAB — CBC WITH DIFFERENTIAL/PLATELET
Eosinophils Relative: 1 % (ref 0–5)
HCT: 29.2 % — ABNORMAL LOW (ref 36.0–46.0)
Lymphocytes Relative: 28 % (ref 12–46)
Lymphs Abs: 2.8 10*3/uL (ref 0.7–4.0)
MCH: 30 pg (ref 26.0–34.0)
MCV: 88.5 fL (ref 78.0–100.0)
Monocytes Absolute: 1 10*3/uL (ref 0.1–1.0)
RBC: 3.3 MIL/uL — ABNORMAL LOW (ref 3.87–5.11)
WBC: 10 10*3/uL (ref 4.0–10.5)

## 2013-01-21 LAB — CARBOXYHEMOGLOBIN
Carboxyhemoglobin: 1.2 % (ref 0.5–1.5)
O2 Saturation: 75.4 %
Total hemoglobin: 8.5 g/dL — ABNORMAL LOW (ref 12.0–16.0)

## 2013-01-21 LAB — GLUCOSE, CAPILLARY
Glucose-Capillary: 134 mg/dL — ABNORMAL HIGH (ref 70–99)
Glucose-Capillary: 152 mg/dL — ABNORMAL HIGH (ref 70–99)
Glucose-Capillary: 154 mg/dL — ABNORMAL HIGH (ref 70–99)
Glucose-Capillary: 158 mg/dL — ABNORMAL HIGH (ref 70–99)

## 2013-01-21 LAB — BASIC METABOLIC PANEL
CO2: 29 mEq/L (ref 19–32)
Chloride: 100 mEq/L (ref 96–112)
Creatinine, Ser: 0.73 mg/dL (ref 0.50–1.10)
Glucose, Bld: 163 mg/dL — ABNORMAL HIGH (ref 70–99)

## 2013-01-21 LAB — PRO B NATRIURETIC PEPTIDE: Pro B Natriuretic peptide (BNP): 1206 pg/mL — ABNORMAL HIGH (ref 0–125)

## 2013-01-21 MED ORDER — FENTANYL BOLUS VIA INFUSION
25.0000 ug | INTRAVENOUS | Status: DC | PRN
  Administered 2013-01-21 – 2013-01-23 (×8): 50 ug via INTRAVENOUS
  Filled 2013-01-21 (×2): qty 50

## 2013-01-21 MED ORDER — SODIUM CHLORIDE 0.9 % IV SOLN
0.0000 ug/h | INTRAVENOUS | Status: DC
  Administered 2013-01-21: 200 ug/h via INTRAVENOUS
  Administered 2013-01-22: 150 ug/h via INTRAVENOUS
  Administered 2013-01-23: 100 ug/h via INTRAVENOUS
  Filled 2013-01-21 (×3): qty 50

## 2013-01-21 MED ORDER — FENTANYL BOLUS VIA INFUSION
50.0000 ug | Freq: Four times a day (QID) | INTRAVENOUS | Status: DC | PRN
  Filled 2013-01-21: qty 200

## 2013-01-21 MED ORDER — DEXMEDETOMIDINE BOLUS VIA INFUSION
1.0000 ug/kg | Freq: Once | INTRAVENOUS | Status: AC
  Administered 2013-01-21: 60 ug via INTRAVENOUS
  Filled 2013-01-21: qty 60

## 2013-01-21 MED ORDER — CARVEDILOL 3.125 MG PO TABS
3.1250 mg | ORAL_TABLET | Freq: Two times a day (BID) | ORAL | Status: DC
  Administered 2013-01-22 (×2): 3.125 mg via ORAL
  Filled 2013-01-21 (×7): qty 1

## 2013-01-21 MED ORDER — SODIUM CHLORIDE 0.9 % IV SOLN
1.0000 mg/h | INTRAVENOUS | Status: DC
  Administered 2013-01-21 (×2): 2 mg/h via INTRAVENOUS
  Administered 2013-01-22: 7 mg/h via INTRAVENOUS
  Administered 2013-01-22 – 2013-01-23 (×2): 6 mg/h via INTRAVENOUS
  Administered 2013-01-23: 3 mg/h via INTRAVENOUS
  Filled 2013-01-21 (×7): qty 10

## 2013-01-21 MED ORDER — SODIUM CHLORIDE 0.9 % IV SOLN
50.0000 ug/h | INTRAVENOUS | Status: DC
  Administered 2013-01-21: 300 ug/h via INTRAVENOUS
  Filled 2013-01-21: qty 50

## 2013-01-21 MED ORDER — MIDAZOLAM BOLUS VIA INFUSION
1.0000 mg | INTRAVENOUS | Status: DC | PRN
  Filled 2013-01-21 (×2): qty 4

## 2013-01-21 NOTE — Progress Notes (Signed)
PCCM NOTE LOS 5 days Date of admit 01/16/2013 11:11 AM PCP is No primary provider on file.    Date of admission: 3/20 Pt Profile:  35 F with no known PMH but reportedly with hx of drug abuse brought to [facility] ED from home via EMS after witnessed arrest. 5 mins of no CPR prior to EMS arrival. approx 10 mins of CPR before ROSC. Initial rhythm reportedly VF. Initial ECG revealed LBBB. Emergent cath > no CAD noted. Hypothermia protocol initiated in ED. Gastric contents noted in oropharynx upon intubation and food particulate matter suctioned from ETT  Hospital Course: 3/19 Admit after OOH arrest. Intubated. Hypothermia protocol initiated 3/19 cardiac cath: no significant CAD   LINES/TUBES: Radial A-line 3/20 >> out ETT 3/20 >>  L  CVL 3/20 >>    MICRO:  Resp 3/20 >> NOF  ABX:  Unasyn 3/20 >>   PROPHYLAXIS:  DVT: SQ heparin  SUP: PPI  CONSULTANTS:  LHC Cards 3/20  EVENTS 01/19/13: Off pressors, failed SBT 01/20/13: Followed some commands yesterday during SBT. Currently on precedex, fentanyl gtt and ativan prn. WUA results in significant agitatio followed by desaturation and HR to 140s, SBT results in desaturations and tachycardia and inc fin fio2 to 100%     SUBJECTIVE/OVERNIGHT/INTERVAL HX 01/21/13 - Oon fent gtt + ativan 2mg   Q4h + 1.51mcg precedex -> even with sedation RASS +4, off sedation on WUA - very agitated RASS +4.  Remains at  High risk for self extubation. BP very high during agitation. STill desturates during SBT   EXAM:  [id number] illl looking HEENT: [place]/AT, PERRL Neck: no JVD noted Lungs: coarse bilateral, prolonged exp phase Cardiovascular: Tachy, regular, IV/VI syst M @ LLSB Abdomen: mildly distended, soft, NT, diminished BS Ext: warm, no edema Neuro: RASS + 4 Skin: no rashes, no track marks noted  DATA:  Recent Labs Lab 01/19/13 1600 01/20/13 0400 01/21/13 0410  NA 141 141 141  K 3.6 4.1 4.0  CL 107 107 100  CO2 24 24 29   BUN 8 9  10   CREATININE 0.58 0.60 0.73  GLUCOSE 131* 166* 163*    Recent Labs Lab 01/19/13 1600 01/20/13 0400 01/21/13 0410  HGB 10.0* 9.4* 9.9*  HCT 29.4* 27.4* 29.2*  WBC 8.8 7.2 10.0  PLT 138* 140* 181    ABG    Component Value Date/Time   PHART 7.378 01/20/2013 1551   PCO2ART 43.6 01/20/2013 1551   PO2ART 68.0* 01/20/2013 1551   HCO3 25.6* 01/20/2013 1551   TCO2 27 01/20/2013 1551   ACIDBASEDEF 1.0 01/20/2013 0858   O2SAT 93.0 01/20/2013 1551   Cardiac Panel (last 3 results) No results found for this basename: CKTOTAL, CKMB, TROPONINI, RELINDX,  in the last 72 hours   IMGAING x 48h Dg Chest Port 1 View  01/21/2013  *RADIOLOGY REPORT*  Clinical Data: Endotracheal tube placement.  PORTABLE CHEST - 1 VIEW  Comparison: 01/20/2013.  Findings: Endotracheal tube tip is 24 mm from the carina.  Enteric tube and left subclavian central line are unchanged.  Pulmonary aeration shows shifting airspace disease, now greater on the right than left.  Right pleural effusion.  Bilateral basilar atelectasis. Compared to prior, support apparatus and overall aeration is roughly similar.  IMPRESSION:  1.Support apparatus as described above.  Endotracheal tube tip 24 mm from the carina. 2.  Shifting airspace disease with overall similar aeration compared to prior.  Right pleural effusion.   Original Report Authenticated By: Andreas Newport, M.D.  Dg Chest Port 1 View  01/20/2013  *RADIOLOGY REPORT*  Clinical Data: Follow up respiratory failure  PORTABLE CHEST - 1 VIEW  Comparison: 01/19/2013  Findings: Perihilar opacities, left greater than right. Given interval change, this appearance suggests mild interstitial edema.  Suspected small right pleural effusion.  No pneumothorax.  Cardiomegaly.  Endotracheal tube terminates 3 cm above the carina.  Enteric tube courses into the stomach.  Left subclavian venous catheter terminates at the cavoatrial junction.  IMPRESSION: Endotracheal tube terminates 3 cm above the  carina.  Cardiomegaly with mild interstitial edema and suspected small right pleural effusion.   Original Report Authenticated By: Charline Bills, M.D.      IMPRESSION:   Principal Problem:   Cardiac arrest Active Problems:   Ventricular fibrillation   Acute respiratory failure with hypoxia   Pulmonary edema   Aspiration into respiratory tract   Hypertension   Polysubstance abuse   Alcohol abuse   Smoker   Hypokalemia   Left bundle branch block   Thrombocytopenia, mild   Hyperglycemia, no hx of DM   Anoxic encephalopathy   Mitral regurgitation   Nonischemic cardiomyopathy   Cardiomyopathy   ASSESSMENT / PLAN:  PULMONARY  Recent Labs Lab 01/16/13 1242  01/16/13 1633 01/16/13 1758 01/16/13 1957 01/20/13 0858 01/20/13 1551  PHART 7.200*  --  7.358  --   --  7.381 7.378  PCO2ART 49.8*  --  36.3  --   --  40.6 43.6  PO2ART 53.0*  --  133.0*  --   --  94.0 68.0*  HCO3 19.5*  --  21.0  --   --  24.1* 25.6*  TCO2 21  < > 25  22 24 24 25 27   O2SAT 79.0  --  99.0  --   --  97.0 93.0  < > = values in this interval not displayed.  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 No CAD by [facility] 3/20  01/20/13 and 01/21/13 - clinicaly flash pulm edema with sbt  P:  Cards following B Blocker  Aggressive diuresis stostart 3/24./14   RENAL  Recent Labs Lab 01/17/13 0443 01/17/13 1031  01/18/13 0349  01/18/13 0800 01/19/13 0457 01/19/13 1600 01/20/13 0400 01/21/13 0410  NA 143 142  < > 142 145 141 141 141 141  K 3.3* 4.1  < > 3.2* 3.0* 3.5 3.6 4.1 4.0  CL 109 111  < > 110 113* 108 107 107 100  CO2 20 21  < > 21 21 22 24 24 29   GLUCOSE 177* 158*  < > 96 93 114* 131* 166* 163*  BUN 12 10  < > 7 6 7 8 9 10   CREATININE 0.44* 0.45*  < > 0.50 0.51 0.56 0.58 0.60 0.73  CALCIUM 8.2* 8.4  < > 8.2* 7.3* 8.5 9.2 8.8 9.3  MG  --  1.7  --  1.8  --   --   --   --  1.9  PHOS  --  2.0*  --  3.5  --   --   --   --  5.7*  < > = values in this interval not displayed.  A:   Normal lytes P: monitor  GASTROINTESTINAL  Recent Labs Lab 01/16/13 1133 01/16/13 1201 01/16/13 2001 01/20/13 0400  AST 361*  --   --  36  ALT 213*  --   --  56*  ALKPHOS 81  --   --  63  BILITOT 0.2*  --   --  0.6  PROT 7.1  --   --  6.4  ALBUMIN 3.3*  --   --  2.6*  INR  --  1.15 1.11  --     A: Mild transaminits P:   TFs initiated 3/23   HEMATOLOGIC  Recent Labs Lab 01/19/13 1600 01/20/13 0400 01/21/13 0410  HGB 10.0* 9.4* 9.9*  HCT 29.4* 27.4* 29.2*  WBC 8.8 7.2 10.0  PLT 138* 140* 181    Recent Labs Lab 01/16/13 1201 01/16/13 2001  INR 1.15 1.11    A:  Mild thrombocytopenia--> a little worse        Mild anemia-->drifted from 11.2-->9.4. Has no obvious evidence of bleeding, could be dilutional   P:  Monitor plts while on SQ heparin PRBC for hgb < 8gm% (cardiac arrest)  INFECTIOUS No results found for this basename: LATICACIDVEN, PROCALCITON,  in the last 168 hours  A: aspiration PNA  P:   Micro and abx as above will complete 8d rx    ENDOCRINE CBG (last 3)   Recent Labs  01/21/13 0014 01/21/13 0350 01/21/13 0721  GLUCAP 134* 154* 178*     A:  Hyperglycemia without prior hx of DM P:   SSI ordered   NEUROLOGIC  A:  Polysubstance abuse inc cocaine and EtOH. S/p rewarming   3/24 - aggitated on wua but then develops flash pulm edema 01/21/13 - RASS +4 on fent,  precedex gtt + ativan scheduled  P:   Increase dose ranges on fent and precedex. Add versed gtt  thiamine and folate  Supportive care   GLOBAL 3/25 - daughter updatd. She is in tears. Spiriitual. Expresses desire to be ppatient for weeks for mom's recovery    CRITICAL CARE: The patient is critically ill with multiple organ systems failure and requires high complexity decision making for assessment and support, frequent evaluation and titration of therapies, application of advanced monitoring technologies and extensive interpretation of multiple databases. Critical Care Time devoted to patient care services described in this note is 35 minutes.      Dr. Kalman Shan, M.D., [facility].C.P Pulmonary and Critical Care Medicine Staff Physician [place] [facility] [place] [facility] and Critical Care Pager: [phone], If no answer or between  15:00h - 7:00h: call [phone]  01/21/2013 8:54 AM

## 2013-01-21 NOTE — Clinical Social Work Psychosocial (Addendum)
Clinical Social Work Department BRIEF PSYCHOSOCIAL ASSESSMENT 01/21/2013  Patient:  [name], [name]     Account Number:  [id number]     Admit date:  01/16/2013  Clinical Social Worker:  Read Drivers  Date/Time:  01/21/2013 02:16 PM  Referred by:  RN  Date Referred:  01/21/2013 Referred for  Other - See comment   Other Referral:   Pt daughter requesting assistance with Medicaid process   Interview type:  Other - See comment Other interview type:   pt intubated and sedated; therefore, unable to participate in assessment.  CSW met with patient daughter, [name].    PSYCHOSOCIAL DATA Living Status:  ALONE Admitted from facility:   Level of care:   Primary support name:  [name] Primary support relationship to patient:  CHILD, ADULT Degree of support available:   good    CURRENT CONCERNS Current Concerns  Post-Acute Placement   Other Concerns:   none    SOCIAL WORK ASSESSMENT / PLAN CSW met with pt daughter at daughter's request to ask for assistance with helping pt apply for Medicaid.  CSW referred pt daughter to the financial counselor who could assist with this.  Pt daughter also requested food vouchers.  CSW relayed to the pt daughter that we no longer provide food vouchers and would be happy to provide a listing of providers in the community that would be available to provide hot meals to her.  Pt daughter requested bus passess for her appointments that she alleges are for the pt.  CSW stated that once [name] has an appointment to request CSW and we can assess the need when the need arises on a case-by-case basis.   Assessment/plan status:  Ongoing psycho social needs Other assessment/ plan:   CSW will continue to follow for assistance with appropriate disposition.   Information/referral to community resources:   Hot meals resources were refused by [name]    PATIENT'S/FAMILY'S RESPONSE TO PLAN OF CARE: Pt daughter was appreciative and thankful for medical staff and CSW  assistance while pt has been in ICU.    Vickii Penna, LCSWA [phone] Clinical Social Work

## 2013-01-21 NOTE — Clinical Social Work Note (Signed)
CSW met with RN at pt bedside.  Pt is sedated and intubated and unable to participate in the assessment.  RN provided information re: pt support system.  RN reports family is very supportive and have been sleeping here at the hospital in the waiting area since mom has been here.    CSW met with Arleta Creek (pt daughter) in the waiting area of ICU.  Arleta Creek stated that she lives with mom and has recently had surgery herself.  Arleta Creek is in the process of applying for Medicaid and Guardianship for pt.  Arleta Creek is also requesting bus passes and meal vouchers for herself.  CSW will supply family with the financial counselor's information (number and location).  CSW will continue to follow.  CSW will contact CSW department re: meal vouchers.  Vickii Penna, LCSWA [phone]  Clinical Social Work

## 2013-01-21 NOTE — Significant Event (Signed)
Noted to be hypotensive on eMD rounds. Pt is on precedex, versed, fentanyl infusions. CVP 1-2. Has lasix ordered and presently due   Plan: Decrease versed and fentanyl infusions sedation regimen reviewed and adjusted D/C lasix   Billy Fischer, MD ; [facility] service Mobile [phone].  After 5:30 PM or weekends, call [phone]

## 2013-01-21 NOTE — Clinical Social Work Note (Addendum)
CSW consulted with Tobi Bastos, financial counselor [phone]) regarding the pt eligibility for Medicaid.  Tobi Bastos informed me that the pt was already being followed.  Tobi Bastos has spoken to pt sister, Egbert Garibaldi.  CSW relayed this information to [place], pt daughter.  [phone] Penna, LCSWA [phone]  Clinical Social Work

## 2013-01-21 NOTE — Progress Notes (Signed)
eLink Physician-Brief Progress Note Patient Name: [name] DOB: 10/08/1961 MRN: [id number]  Date of Service  01/21/2013   HPI/Events of Note  Continued issues with agitation despite max precedex at 1.2 and prn ativan q4 hours.  Current HR of 88 with BP of 90/55 (64).  Fentanyl is at 200 mcg/hr with bolus fentanyl.  At times BP will spike up along with HR.  eICU Interventions  Plan: Will try additional bolus of precedex Continue to monitor. May require additional ativan   Intervention Category Major Interventions: Delirium, psychosis, severe agitation - evaluation and management  DETERDING,ELIZABETH 01/21/2013, 2:44 AM

## 2013-01-21 NOTE — Progress Notes (Signed)
[name], female   DOB: [birth year], 52 y.o.   MRN: [id number]    SUBJECTIVE: Patient heavily sedated this morning, very agitated when not sedated.  BP fluctuating considerably depending on level of agitation.  She diuresed very well yesterday, weight down considerably, CVP 4 this morning.   Marland Kitchen ampicillin-sulbactam (UNASYN) IV  1.5 g Intravenous Q8H  . antiseptic oral rinse  15 mL Mouth Rinse QID  . chlorhexidine  15 mL Mouth Rinse BID  . famotidine  20 mg Oral BID  . furosemide  40 mg Intravenous Q8H  . heparin  5,000 Units Subcutaneous Q8H  . insulin aspart  0-9 Units Subcutaneous Q4H  . lisinopril  2.5 mg Oral BID  . metoprolol tartrate  12.5 mg Per Tube BID  . potassium chloride  40 mEq Per Tube BID  Fentanyl gtt Dexmedetomidine gtt   Filed Vitals:   01/21/13 0510 01/21/13 0600 01/21/13 0611 01/21/13 0700  BP: 148/69  174/72 193/75  Pulse:      Temp: 100.4 F (38 C) 99.3 F (37.4 C) 99.7 F (37.6 C) 99.5 F (37.5 C)  TempSrc:      Resp: 23 28 16 29   Height:      Weight:      SpO2: 100% 100% 100% 100%    Intake/Output Summary (Last 24 hours) at 01/21/13 0805 Last data filed at 01/21/13 0734  Gross per 24 hour  Intake 2010.2 ml  Output   7710 ml  Net -5699.8 ml    LABS: Basic Metabolic Panel:  Recent Labs  78/29/56 0400 01/21/13 0410  NA 141 141  K 4.1 4.0  CL 107 100  CO2 24 29  GLUCOSE 166* 163*  BUN 9 10  CREATININE 0.60 0.73  CALCIUM 8.8 9.3  MG  --  1.9  PHOS  --  5.7*   Liver Function Tests:  Recent Labs  01/20/13 0400  AST 36  ALT 56*  ALKPHOS 63  BILITOT 0.6  PROT 6.4  ALBUMIN 2.6*   No results found for this basename: LIPASE, AMYLASE,  in the last 72 hours CBC:  Recent Labs  01/20/13 0400 01/21/13 0410  WBC 7.2 10.0  NEUTROABS  --  6.2  HGB 9.4* 9.9*  HCT 27.4* 29.2*  MCV 87.5 88.5  PLT 140* 181   Cardiac Enzymes: No results found for this basename: CKTOTAL, CKMB, CKMBINDEX, TROPONINI,  in the last 72  hours BNP: No components found with this basename: POCBNP,  D-Dimer: No results found for this basename: DDIMER,  in the last 72 hours Hemoglobin A1C: No results found for this basename: HGBA1C,  in the last 72 hours Fasting Lipid Panel: No results found for this basename: CHOL, HDL, LDLCALC, TRIG, CHOLHDL, LDLDIRECT,  in the last 72 hours Thyroid Function Tests: No results found for this basename: TSH, T4TOTAL, FREET3, T3FREE, THYROIDAB,  in the last 72 hours Anemia Panel: No results found for this basename: VITAMINB12, FOLATE, FERRITIN, TIBC, IRON, RETICCTPCT,  in the last 72 hours  RADIOLOGY: Dg Chest Port 1 View  01/21/2013  *RADIOLOGY REPORT*  Clinical Data: Endotracheal tube placement.  PORTABLE CHEST - 1 VIEW  Comparison: 01/20/2013.  Findings: Endotracheal tube tip is 24 mm from the carina.  Enteric tube and left subclavian central line are unchanged.  Pulmonary aeration shows shifting airspace disease, now greater on the right than left.  Right pleural effusion.  Bilateral basilar atelectasis. Compared to prior, support apparatus and overall aeration is roughly similar.  IMPRESSION:  1.Support apparatus as described above.  Endotracheal tube tip 24 mm from the carina. 2.  Shifting airspace disease with overall similar aeration compared to prior.  Right pleural effusion.   Original Report Authenticated By: Andreas Newport, M.D.    Dg Chest Port 1 View  01/20/2013  *RADIOLOGY REPORT*  Clinical Data: Follow up respiratory failure  PORTABLE CHEST - 1 VIEW  Comparison: 01/19/2013  Findings: Perihilar opacities, left greater than right. Given interval change, this appearance suggests mild interstitial edema.  Suspected small right pleural effusion.  No pneumothorax.  Cardiomegaly.  Endotracheal tube terminates 3 cm above the carina.  Enteric tube courses into the stomach.  Left subclavian venous catheter terminates at the cavoatrial junction.  IMPRESSION: Endotracheal tube terminates 3 cm above  the carina.  Cardiomegaly with mild interstitial edema and suspected small right pleural effusion.   Original Report Authenticated By: Charline Bills, M.D.    Dg Chest Port 1 View  01/19/2013  *RADIOLOGY REPORT*  Clinical Data: Respiratory failure.  PORTABLE CHEST - 1 VIEW  Comparison: 01/17/2013  Findings: Endotracheal tube, nasogastric tube, and left subclavian central line are stable in position.  Perihilar interstitial and airspace edema or infiltrates, right greater than left, not significantly changed since previous exam.  There is blunting of the right lateral costophrenic angle suggesting small effusion. Heart size upper limits normal for technique.  IMPRESSION:  1.  Stable asymmetric edema/infiltrates. 2. Support hardware stable in position.   Original Report Authenticated By: D. Andria Rhein, MD    Dg Chest Port 1 View  01/17/2013  *RADIOLOGY REPORT*  Clinical Data: Respiratory failure  PORTABLE CHEST - 1 VIEW  Comparison: Yesterday  Findings: Tubular structures stable.  Bilateral consolidation right greater than left improved.  Normal heart size.  No pneumothorax.  IMPRESSION: Improved airspace disease.   Original Report Authenticated By: Jolaine Click, M.D.    Dg Chest Port 1 View  01/16/2013  **ADDENDUM** CREATED: 01/16/2013 14:23:00  This has been made a PRA call report utilizing dashboard call feature.  **END ADDENDUM** SIGNED BY: Almedia Balls. Constance Goltz, M.D.   01/16/2013  *RADIOLOGY REPORT*  Clinical Data: Line placement.  PORTABLE CHEST - 1 VIEW  Comparison: None.  Findings: Left central line tip projects at the level of the distal superior vena cava.  Endotracheal tube tip 4 cm above the carina.  Nasogastric tube courses below the diaphragm.  The tip is not included on this exam.  No gross pneumothorax.  Heart slightly enlarged.  Diffuse air space disease greater on the right suggestive of pulmonary edema.  Infectious infiltrate not excluded.  Calcified aorta consistent with age advanced  atherosclerotic type changes.  IMPRESSION: Left central line tip projects at the level of the distal superior vena cava.  Endotracheal tube tip 4 cm above the carina.  Nasogastric tube courses below the diaphragm.  The tip is not included on this exam.  Heart slightly enlarged.  Diffuse air space disease greater on the right suggestive of pulmonary edema.  Infectious infiltrate not excluded.  This is a call report.   Original Report Authenticated By: Lacy Duverney, M.D.     PHYSICAL EXAM General: intubated, sedated Neck: No JVD, no thyromegaly or thyroid nodule.  Lungs: Dependent crackles CV: Nondisplaced PMI.  Heart regular S1/S2, no S3/S4, 2/6 HSM apex.  No peripheral edema.  Abdomen: Soft, no hepatosplenomegaly, no distention.  Extremities: No clubbing or cyanosis.   TELEMETRY: Reviewed telemetry pt in NSR  ASSESSMENT AND PLAN: 52 yo with history  of substance abuse had out of hospital arrest with defibrillation in the field.  Cath without significant CAD.  Now intubated. 1. CHF: Acute on chronic systolic CHF, EF 16% (nonischemic cardiomyopathy). Urine drug screen negative. Volume status much better today, CVP 3-4.  Vigorous diuresis yesterday.  - Lasix 40 mg per tube daily (can stop IV).  CVP monitoring.  - Continue lisinopril and Coreg at low dose if SBP > 100.  - Check co-ox, TSH, SPEP  - Would get cardiac MRI prior to discharge to assess for infiltrative disease.  2. Mitral regurgitation: Prominent murmur. Read as moderate on initial echo. May be functional. Repeat limited echo when optimized prior to discharge to reassess MR.  3. Cardiac arrest: If she has neurologic recovery, which it looks like she will, will need ICD prior to discharge.  4. Agitation: History of ETOH, ? Withdrawing.  Will complicate vent weaning.   [name] 01/21/2013 8:12 AM

## 2013-01-22 ENCOUNTER — Inpatient Hospital Stay (HOSPITAL_COMMUNITY): Payer: 59

## 2013-01-22 DIAGNOSIS — I5021 Acute systolic (congestive) heart failure: Secondary | ICD-10-CM

## 2013-01-22 DIAGNOSIS — I509 Heart failure, unspecified: Secondary | ICD-10-CM

## 2013-01-22 LAB — CBC WITH DIFFERENTIAL/PLATELET
Basophils Absolute: 0 10*3/uL (ref 0.0–0.1)
Eosinophils Absolute: 0.2 10*3/uL (ref 0.0–0.7)
Eosinophils Relative: 2 % (ref 0–5)
HCT: 25.7 % — ABNORMAL LOW (ref 36.0–46.0)
MCH: 30.2 pg (ref 26.0–34.0)
MCHC: 33.9 g/dL (ref 30.0–36.0)
MCV: 89.2 fL (ref 78.0–100.0)
Monocytes Absolute: 1.2 10*3/uL — ABNORMAL HIGH (ref 0.1–1.0)
Platelets: 221 10*3/uL (ref 150–400)
RDW: 14.3 % (ref 11.5–15.5)

## 2013-01-22 LAB — GLUCOSE, CAPILLARY
Glucose-Capillary: 129 mg/dL — ABNORMAL HIGH (ref 70–99)
Glucose-Capillary: 113 mg/dL — ABNORMAL HIGH (ref 70–99)

## 2013-01-22 LAB — BASIC METABOLIC PANEL
BUN: 12 mg/dL (ref 6–23)
CO2: 27 mEq/L (ref 19–32)
Calcium: 9.4 mg/dL (ref 8.4–10.5)
Creatinine, Ser: 0.62 mg/dL (ref 0.50–1.10)
Glucose, Bld: 191 mg/dL — ABNORMAL HIGH (ref 70–99)

## 2013-01-22 LAB — MAGNESIUM: Magnesium: 2.2 mg/dL (ref 1.5–2.5)

## 2013-01-22 LAB — PROTIME-INR
INR: 1.03 (ref 0.00–1.49)
Prothrombin Time: 13.4 seconds (ref 11.6–15.2)

## 2013-01-22 MED ORDER — FENTANYL CITRATE 0.05 MG/ML IJ SOLN: 200.0000 ug | Freq: Once | INTRAMUSCULAR | Status: DC

## 2013-01-22 MED ORDER — VECURONIUM BROMIDE 10 MG IV SOLR: 10.0000 mg | Freq: Once | INTRAVENOUS | Status: DC

## 2013-01-22 MED ORDER — ETOMIDATE 2 MG/ML IV SOLN: 40.0000 mg | Freq: Once | INTRAVENOUS | Status: DC

## 2013-01-22 MED ORDER — MIDAZOLAM HCL 2 MG/2ML IJ SOLN: 4.0000 mg | Freq: Once | INTRAMUSCULAR | Status: DC

## 2013-01-22 MED ORDER — PROPOFOL 10 MG/ML IV EMUL: 5.0000 ug/kg/min | Freq: Once | INTRAVENOUS | Status: DC

## 2013-01-22 NOTE — Clinical Social Work Note (Signed)
CSW spoke to pt daughter Arleta Creek) and relayed Tobi Bastos (financial counselor's) phone number so daughter can make sure Tobi Bastos has everything she needs in order for pt to be considered for Medicaid.  Tobi Bastos gave CSW permission to relay phone number to pt.  Tobi Bastos, Artist for pt, [phone]  Vickii Penna, LCSWA [phone]  Clinical Social Work

## 2013-01-22 NOTE — Progress Notes (Signed)
Spoke with daughter over the phone, patient is ready for trach she consented over the phone but wished for her brother there to sign the consent.  When I spoke to the brother, he said he would rather speak to his sister before he signs anything.  At this point, I signed the consent and RN will get paper signed.  If family declines then will cancel but as of now will arrange for trach tomorrow at noon.  Alyson Reedy, M.D. [facility] Pulmonary/Critical Care Medicine. Pager: [phone]. After hours pager: [phone].

## 2013-01-22 NOTE — Progress Notes (Addendum)
PCCM NOTE LOS 6 days Date of admit 01/16/2013 11:11 AM PCP is No primary provider on file.    Date of admission: 3/20 Pt Profile:  61 F with no known PMH but reportedly with hx of drug abuse brought to [facility] ED from home via EMS after witnessed arrest. 5 mins of no CPR prior to EMS arrival. approx 10 mins of CPR before ROSC. Initial rhythm reportedly VF. Initial ECG revealed LBBB. Emergent cath > no CAD noted. Hypothermia protocol initiated in ED. Gastric contents noted in oropharynx upon intubation and food particulate matter suctioned from ETT  Hospital Course: 3/19 Admit after OOH arrest. Intubated. Hypothermia protocol initiated 3/19 cardiac cath: no significant CAD   LINES/TUBES: Radial A-line 3/20 >> out ETT 3/20 >>  L [place] CVL 3/20 >>    MICRO:  Resp 3/20 >> NOF  ABX:  Unasyn 3/20 >>  (3/28, needs to be established) Anti-infectives   Start     Dose/Rate Route Frequency Ordered Stop   01/16/13 1300  ampicillin-sulbactam (UNASYN) 1.5 g in sodium chloride 0.9 % 50 mL IVPB     1.5 g 100 mL/hr over 30 Minutes Intravenous Every 8 hours 01/16/13 1205         PROPHYLAXIS:  DVT: SQ heparin  SUP: PPI  CONSULTANTS:  [facility] Cards 3/20  EVENTS 01/19/13: Off pressors, failed SBT 01/20/13: Followed some commands yesterday during SBT. Currently on precedex, fentanyl gtt and ativan prn. WUA results in significant agitatio followed by desaturation and HR to 140s, SBT results in desaturations and tachycardia and inc fin fio2 to 100%   01/21/13 - Oon fent gtt + ativan 2mg   Q4h + 1.36mcg precedex -> even with sedation RASS +4, off sedation on WUA - very agitated RASS +4.  Remains at  High risk for self extubation. BP very high during agitation. STill desturates during SBT    SUBJECTIVE/OVERNIGHT/INTERVAL HX 01/22/13 - heavily sedated. vEry agiated on WUA. Flucturating bp  EXAM:  RUE:[id number] illl looking HEENT: /AT, PERRL Neck: no JVD noted Lungs: coarse bilateral,  prolonged exp phase Cardiovascular: Tachy, regular, IV/VI syst M @ LLSB Abdomen: mildly distended, soft, NT, diminished BS Ext: warm, no edema Neuro: RASS + 4 during WUA Skin: no rashes, no track marks noted  DATA:  Recent Labs Lab 01/20/13 0400 01/21/13 0410 01/22/13 0410  NA 141 141 136  K 4.1 4.0 4.7  CL 107 100 100  CO2 24 29 27   BUN 9 10 12   CREATININE 0.60 0.73 0.62  GLUCOSE 166* 163* 191*    Recent Labs Lab 01/20/13 0400 01/21/13 0410 01/22/13 0410  HGB 9.4* 9.9* 8.7*  HCT 27.4* 29.2* 25.7*  WBC 7.2 10.0 9.6  PLT 140* 181 221    ABG    Component Value Date/Time   PHART 7.378 01/20/2013 1551   PCO2ART 43.6 01/20/2013 1551   PO2ART 68.0* 01/20/2013 1551   HCO3 25.6* 01/20/2013 1551   TCO2 27 01/20/2013 1551   ACIDBASEDEF 1.0 01/20/2013 0858   O2SAT 75.4 01/21/2013 1055   Cardiac Panel (last 3 results) No results found for this basename: CKTOTAL, CKMB, TROPONINI, RELINDX,  in the last 72 hours   IMGAING x 48h Dg Chest Port 1 View  01/21/2013  *RADIOLOGY REPORT*  Clinical Data: Endotracheal tube placement.  PORTABLE CHEST - 1 VIEW  Comparison: 01/20/2013.  Findings: Endotracheal tube tip is 24 mm from the carina.  Enteric tube and left subclavian central line are unchanged.  Pulmonary aeration shows shifting airspace disease,  now greater on the right than left.  Right pleural effusion.  Bilateral basilar atelectasis. Compared to prior, support apparatus and overall aeration is roughly similar.  IMPRESSION:  1.Support apparatus as described above.  Endotracheal tube tip 24 mm from the carina. 2.  Shifting airspace disease with overall similar aeration compared to prior.  Right pleural effusion.   Original Report Authenticated By: Andreas Newport, M.D.      IMPRESSION:   Principal Problem:   Cardiac arrest Active Problems:   Ventricular fibrillation   Acute respiratory failure with hypoxia   Pulmonary edema   Aspiration into respiratory tract   Hypertension    Polysubstance abuse   Alcohol abuse   Smoker   Hypokalemia   Left bundle branch block   Thrombocytopenia, mild   Hyperglycemia, no hx of DM   Anoxic encephalopathy   Mitral regurgitation   Nonischemic cardiomyopathy   Cardiomyopathy   ASSESSMENT / PLAN:  PULMONARY  Recent Labs Lab 01/16/13 1242  01/16/13 1633 01/16/13 1758 01/16/13 1957 01/20/13 0858 01/20/13 1551 01/21/13 1055  PHART 7.200*  --  7.358  --   --  7.381 7.378  --   PCO2ART 49.8*  --  36.3  --   --  40.6 43.6  --   PO2ART 53.0*  --  133.0*  --   --  94.0 68.0*  --   HCO3 19.5*  --  21.0  --   --  24.1* 25.6*  --   TCO2 21  < > 25  22 24 24 25 27   --   O2SAT 79.0  --  99.0  --   --  97.0 93.0 75.4  < > = values in this interval not displayed.  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 No CAD by [facility] 3/20  01/20/13 and 01/21/13 and 01/22/13 - clinicaly flash pulm edema with sbt  P:  Cards following B Blocker  Aggressive diuresis stostart 3/24./14 Cards suggesting future MRI and ICD when improved   RENAL  Recent Labs Lab 01/17/13 0443 01/17/13 1031  01/18/13 0349  01/19/13 0457 01/19/13 1600 01/20/13 0400 01/21/13 0410 01/22/13 0410  NA 143 142  < > 142  < > 141 141 141 141 136  K 3.3* 4.1  < > 3.2*  < > 3.5 3.6 4.1 4.0 4.7  CL 109 111  < > 110  < > 108 107 107 100 100  CO2 20 21  < > 21  < > 22 24 24 29 27   GLUCOSE 177* 158*   < > 96  < > 114* 131* 166* 163* 191*  BUN 12 10  < > 7  < > 7 8 9 10 12   CREATININE 0.44* 0.45*  < > 0.50  < > 0.56 0.58 0.60 0.73 0.62  CALCIUM 8.2* 8.4  < > 8.2*  < > 8.5 9.2 8.8 9.3 9.4  MG  --  1.7  --  1.8  --   --   --   --  1.9 2.2  PHOS  --  2.0*  --  3.5  --   --   --   --  5.7* 4.9*  < > = values in this interval not displayed.  A:   Normal lytes P: monitor  GASTROINTESTINAL  Recent Labs Lab 01/16/13 1133 01/16/13 1201 01/16/13 2001 01/20/13 0400  AST 361*  --   --  36  ALT 213*  --   --  56*  ALKPHOS 81  --   --  63  BILITOT 0.2*  --   --  0.6  PROT 7.1  --   --  6.4  ALBUMIN 3.3*  --   --  2.6*  INR  --  1.15 1.11  --     A: Mild transaminits P:   TFs initiated 3/23   HEMATOLOGIC  Recent Labs Lab 01/20/13 0400 01/21/13 0410 01/22/13 0410  HGB 9.4* 9.9* 8.7*  HCT 27.4* 29.2* 25.7*  WBC 7.2 10.0 9.6  PLT 140* 181 221    Recent Labs Lab 01/16/13 1201 01/16/13 2001  INR 1.15 1.11    A:  Mild thrombocytopenia--> a little worse        Mild anemia-->drifted from 11.2-->9.4. Has no obvious evidence of bleeding, could be dilutional   P:  Monitor plts while on SQ heparin PRBC for hgb < 8gm% (cardiac arrest)  INFECTIOUS No results found for this basename: LATICACIDVEN, PROCALCITON,  in the last 168 hours  A: aspiration PNA  P:   Micro and abx as above will complete 8d rx    ENDOCRINE CBG (last 3)   Recent Labs  01/21/13 2321 01/22/13 0419 01/22/13 0728  GLUCAP 143* 170* 135*     A:  Hyperglycemia without prior hx of DM P:   SSI ordered   NEUROLOGIC  A:  Polysubstance abuse inc cocaine and EtOH. S/p rewarming   3/24 - aggitated on wua but then develops flash pulm edema 01/21/13 and 01/22/13- RASS +4 on fent, precedex gtt + ativan scheduled  P:   Increase dose ranges on fent and precedex. Add versed gtt  thiamine and folate  Supportive care   GLOBAL 3/25 - daughter updatd. She is in tears. Spiriitual. Expresses desire  to be ppatient for weeks for mom's recovery 3/26 - son updated. Likely needs trach/ltac. I called one numeber [phone] but a very groggy "daughter in law" referred me to the daughter Marline Backbone on [phone] but this person denied identity   CRITICAL CARE: The patient is critically ill with multiple organ systems failure and requires high complexity decision making for assessment and support, frequent evaluation and titration of therapies, application of advanced monitoring technologies and extensive interpretation of multiple databases. Critical Care Time devoted to patient care services described in this note is 35 minutes.      Dr. Kalman Shan, M.D., [facility].C.P Pulmonary and Critical Care Medicine Staff Physician [facility] Pulmonary and Critical Care Pager: [phone], If no answer or between  15:00h - 7:00h: call [phone]  01/22/2013 10:21 AM

## 2013-01-22 NOTE — Progress Notes (Signed)
[name], female   DOB: [birth year], 52 y.o.   MRN: [id number]     SUBJECTIVE: Patient heavily sedated this morning, agitated when not sedated.  BP fluctuating considerably depending on level of agitation.  CVP 12 today but distal port on line not working well so unsure how accurate this is.   Marland Kitchen ampicillin-sulbactam (UNASYN) IV  1.5 g Intravenous Q8H  . antiseptic oral rinse  15 mL Mouth Rinse QID  . carvedilol  3.125 mg Oral BID WC  . chlorhexidine  15 mL Mouth Rinse BID  . famotidine  20 mg Oral BID  . heparin  5,000 Units Subcutaneous Q8H  . insulin aspart  0-9 Units Subcutaneous Q4H  . lisinopril  2.5 mg Oral BID  . potassium chloride  40 mEq Per Tube BID  Fentanyl gtt Dexmedetomidine gtt Versed gtt  Filed Vitals:   01/22/13 0400 01/22/13 0430 01/22/13 0500 01/22/13 0600  BP: 91/62 93/63 122/67 83/54  Pulse:  77    Temp: 98.6 F (37 C)  98.2 F (36.8 C) 98.4 F (36.9 C)  TempSrc:      Resp: 14 14 14 14   Height:      Weight:      SpO2: 100% 100% 100% 100%    Intake/Output Summary (Last 24 hours) at 01/22/13 0722 Last data filed at 01/22/13 0641  Gross per 24 hour  Intake 2612.09 ml  Output   2385 ml  Net 227.09 ml    LABS: Basic Metabolic Panel:  Recent Labs  13/24/40 0410 01/22/13 0410  NA 141 136  K 4.0 4.7  CL 100 100  CO2 29 27  GLUCOSE 163* 191*  BUN 10 12  CREATININE 0.73 0.62  CALCIUM 9.3 9.4  MG 1.9 2.2  PHOS 5.7* 4.9*   Liver Function Tests:  Recent Labs  01/20/13 0400  AST 36  ALT 56*  ALKPHOS 63  BILITOT 0.6  PROT 6.4  ALBUMIN 2.6*   No results found for this basename: LIPASE, AMYLASE,  in the last 72 hours CBC:  Recent Labs  01/21/13 0410 01/22/13 0410  WBC 10.0 9.6  NEUTROABS 6.2 5.2  HGB 9.9* 8.7*  HCT 29.2* 25.7*  MCV 88.5 89.2  PLT 181 221   Cardiac Enzymes: No results found for this basename: CKTOTAL, CKMB, CKMBINDEX, TROPONINI,  in the last 72 hours BNP: No components found with this  basename: POCBNP,  D-Dimer: No results found for this basename: DDIMER,  in the last 72 hours Hemoglobin A1C: No results found for this basename: HGBA1C,  in the last 72 hours Fasting Lipid Panel: No results found for this basename: CHOL, HDL, LDLCALC, TRIG, CHOLHDL, LDLDIRECT,  in the last 72 hours Thyroid Function Tests:  Recent Labs  01/21/13 0830  TSH 1.222   Anemia Panel: No results found for this basename: VITAMINB12, FOLATE, FERRITIN, TIBC, IRON, RETICCTPCT,  in the last 72 hours  RADIOLOGY: Dg Chest Port 1 View  01/21/2013  *RADIOLOGY REPORT*  Clinical Data: Endotracheal tube placement.  PORTABLE CHEST - 1 VIEW  Comparison: 01/20/2013.  Findings: Endotracheal tube tip is 24 mm from the carina.  Enteric tube and left subclavian central line are unchanged.  Pulmonary aeration shows shifting airspace disease, now greater on the right than left.  Right pleural effusion.  Bilateral basilar atelectasis. Compared to prior, support apparatus and overall aeration is roughly similar.  IMPRESSION:  1.Support apparatus as described above.  Endotracheal tube tip 24 mm from the carina. 2.  Shifting airspace disease with overall similar aeration compared to prior.  Right pleural effusion.   Original Report Authenticated By: Andreas Newport, M.D.    Dg Chest Port 1 View  01/20/2013  *RADIOLOGY REPORT*  Clinical Data: Follow up respiratory failure  PORTABLE CHEST - 1 VIEW  Comparison: 01/19/2013  Findings: Perihilar opacities, left greater than right. Given interval change, this appearance suggests mild interstitial edema.  Suspected small right pleural effusion.  No pneumothorax.  Cardiomegaly.  Endotracheal tube terminates 3 cm above the carina.  Enteric tube courses into the stomach.  Left subclavian venous catheter terminates at the cavoatrial junction.  IMPRESSION: Endotracheal tube terminates 3 cm above the carina.  Cardiomegaly with mild interstitial edema and suspected small right pleural  effusion.   Original Report Authenticated By: Charline Bills, M.D.    Dg Chest Port 1 View  01/19/2013  *RADIOLOGY REPORT*  Clinical Data: Respiratory failure.  PORTABLE CHEST - 1 VIEW  Comparison: 01/17/2013  Findings: Endotracheal tube, nasogastric tube, and left subclavian central line are stable in position.  Perihilar interstitial and airspace edema or infiltrates, right greater than left, not significantly changed since previous exam.  There is blunting of the right lateral costophrenic angle suggesting small effusion. Heart size upper limits normal for technique.  IMPRESSION:  1.  Stable asymmetric edema/infiltrates. 2. Support hardware stable in position.   Original Report Authenticated By: D. Andria Rhein, MD    Dg Chest Port 1 View  01/17/2013  *RADIOLOGY REPORT*  Clinical Data: Respiratory failure  PORTABLE CHEST - 1 VIEW  Comparison: Yesterday  Findings: Tubular structures stable.  Bilateral consolidation right greater than left improved.  Normal heart size.  No pneumothorax.  IMPRESSION: Improved airspace disease.   Original Report Authenticated By: Jolaine Click, M.D.    Dg Chest Port 1 View  01/16/2013  **ADDENDUM** CREATED: 01/16/2013 14:23:00  This has been made a PRA call report utilizing dashboard call feature.  **END ADDENDUM** SIGNED BY: Almedia Balls. Constance Goltz, M.D.   01/16/2013  *RADIOLOGY REPORT*  Clinical Data: Line placement.  PORTABLE CHEST - 1 VIEW  Comparison: None.  Findings: Left central line tip projects at the level of the distal superior vena cava.  Endotracheal tube tip 4 cm above the carina.  Nasogastric tube courses below the diaphragm.  The tip is not included on this exam.  No gross pneumothorax.  Heart slightly enlarged.  Diffuse air space disease greater on the right suggestive of pulmonary edema.  Infectious infiltrate not excluded.  Calcified aorta consistent with age advanced atherosclerotic type changes.  IMPRESSION: Left central line tip projects at the level of the  distal superior vena cava.  Endotracheal tube tip 4 cm above the carina.  Nasogastric tube courses below the diaphragm.  The tip is not included on this exam.  Heart slightly enlarged.  Diffuse air space disease greater on the right suggestive of pulmonary edema.  Infectious infiltrate not excluded.  This is a call report.   Original Report Authenticated By: Lacy Duverney, M.D.     PHYSICAL EXAM General: intubated, sedated Neck: No JVD, no thyromegaly or thyroid nodule.  Lungs: Dependent crackles CV: Nondisplaced PMI.  Heart regular S1/S2, no S3/S4, 2/6 HSM apex.  No peripheral edema.  Abdomen: Soft, no hepatosplenomegaly, no distention.  Extremities: No clubbing or cyanosis.   TELEMETRY: Reviewed telemetry pt in NSR  ASSESSMENT AND PLAN: 52 yo with history of substance abuse had out of hospital arrest with defibrillation in the field.  Cath without significant CAD.  Now intubated. 1. CHF: Acute on chronic systolic CHF, EF 96% (nonischemic cardiomyopathy). Urine drug screen negative. Suspect CVP is not accurate. BP low in setting of heavy sedation.  Co-ox 75% yesterday.  - Would given Lasix IV dose today when BP is more stable.   - Continue lisinopril and Coreg at low dose if SBP > 100.  - Would get cardiac MRI prior to discharge to assess for infiltrative disease.  2. Mitral regurgitation: Prominent murmur. Read as moderate on initial echo. May be functional. Repeat limited echo when optimized prior to discharge to reassess MR.  3. Cardiac arrest: If she has neurologic recovery, which it looks like she will, will need ICD prior to discharge.  4. Agitation: History of ETOH, ? Withdrawing.  Will complicate vent weaning.   [name] 01/22/2013 7:22 AM

## 2013-01-22 NOTE — Progress Notes (Signed)
Advanced ETT back to 23 at lip. PT vitals WNL. Expiratory TV good. Pending x-ray for tube placement. RN aware.

## 2013-01-23 ENCOUNTER — Inpatient Hospital Stay (HOSPITAL_COMMUNITY): Payer: 59

## 2013-01-23 ENCOUNTER — Encounter (HOSPITAL_COMMUNITY): Payer: 59

## 2013-01-23 LAB — POCT I-STAT 3, ART BLOOD GAS (G3+)
Bicarbonate: 27.3 mEq/L — ABNORMAL HIGH (ref 20.0–24.0)
O2 Saturation: 90 %
TCO2: 29 mmol/L (ref 0–100)
pCO2 arterial: 55.5 mmHg — ABNORMAL HIGH (ref 35.0–45.0)
pO2, Arterial: 65 mmHg — ABNORMAL LOW (ref 80.0–100.0)

## 2013-01-23 LAB — BLOOD GAS, ARTERIAL
Acid-Base Excess: 1.2 mmol/L (ref 0.0–2.0)
Bicarbonate: 24 mEq/L (ref 20.0–24.0)
FIO2: 100 %
O2 Saturation: 99.7 %
PEEP: 0.5 cmH2O
Patient temperature: 98.6
TCO2: 24.9 mmol/L (ref 0–100)

## 2013-01-23 LAB — MAGNESIUM: Magnesium: 2.5 mg/dL (ref 1.5–2.5)

## 2013-01-23 LAB — PROTEIN ELECTROPHORESIS, SERUM
Beta Globulin: 6.8 % (ref 4.7–7.2)
Gamma Globulin: 12.4 % (ref 11.1–18.8)
M-Spike, %: NOT DETECTED g/dL
Total Protein ELP: 6.5 g/dL (ref 6.0–8.3)

## 2013-01-23 LAB — CBC WITH DIFFERENTIAL/PLATELET
Basophils Absolute: 0 10*3/uL (ref 0.0–0.1)
Eosinophils Absolute: 0.3 10*3/uL (ref 0.0–0.7)
Eosinophils Relative: 3 % (ref 0–5)
Lymphocytes Relative: 41 % (ref 12–46)
MCH: 29.7 pg (ref 26.0–34.0)
MCV: 88.8 fL (ref 78.0–100.0)
Platelets: 251 10*3/uL (ref 150–400)
RDW: 14.5 % (ref 11.5–15.5)
WBC: 9 10*3/uL (ref 4.0–10.5)

## 2013-01-23 LAB — GLUCOSE, CAPILLARY
Glucose-Capillary: 151 mg/dL — ABNORMAL HIGH (ref 70–99)
Glucose-Capillary: 149 mg/dL — ABNORMAL HIGH (ref 70–99)
Glucose-Capillary: 127 mg/dL — ABNORMAL HIGH (ref 70–99)
Glucose-Capillary: 98 mg/dL (ref 70–99)

## 2013-01-23 LAB — BASIC METABOLIC PANEL
CO2: 25 mEq/L (ref 19–32)
Calcium: 9.5 mg/dL (ref 8.4–10.5)
Creatinine, Ser: 0.7 mg/dL (ref 0.50–1.10)
GFR calc non Af Amer: >90 mL/min (ref >90)

## 2013-01-23 MED ORDER — PANTOPRAZOLE SODIUM 40 MG IV SOLR
40.0000 mg | INTRAVENOUS | Status: DC
  Administered 2013-01-23 – 2013-01-28 (×6): 40 mg via INTRAVENOUS
  Filled 2013-01-23 (×7): qty 40

## 2013-01-23 MED ORDER — SODIUM CHLORIDE 0.9 % IV SOLN
25.0000 ug/h | INTRAVENOUS | Status: DC
  Administered 2013-01-23: 50 ug/h via INTRAVENOUS
  Administered 2013-01-24 – 2013-01-25 (×3): 400 ug/h via INTRAVENOUS
  Filled 2013-01-23 (×5): qty 50

## 2013-01-23 MED ORDER — HALOPERIDOL LACTATE 5 MG/ML IJ SOLN
5.0000 mg | Freq: Four times a day (QID) | INTRAMUSCULAR | Status: DC | PRN
  Administered 2013-01-23: 5 mg via INTRAVENOUS
  Filled 2013-01-23: qty 1

## 2013-01-23 MED ORDER — CARVEDILOL 3.125 MG PO TABS
3.1250 mg | ORAL_TABLET | Freq: Two times a day (BID) | ORAL | Status: DC
  Administered 2013-01-23 – 2013-01-28 (×9): 3.125 mg via ORAL
  Filled 2013-01-23 (×12): qty 1

## 2013-01-23 MED ORDER — MIDAZOLAM HCL 2 MG/2ML IJ SOLN
INTRAMUSCULAR | Status: AC
  Filled 2013-01-23: qty 4

## 2013-01-23 MED ORDER — LORAZEPAM 2 MG/ML IJ SOLN
INTRAMUSCULAR | Status: AC
  Administered 2013-01-23: 1 mg
  Filled 2013-01-23: qty 1

## 2013-01-23 MED ORDER — ROCURONIUM BROMIDE 50 MG/5ML IV SOLN
INTRAVENOUS | Status: AC
  Filled 2013-01-23: qty 2

## 2013-01-23 MED ORDER — SUCCINYLCHOLINE CHLORIDE 20 MG/ML IJ SOLN
INTRAMUSCULAR | Status: AC
  Filled 2013-01-23: qty 1

## 2013-01-23 MED ORDER — FENTANYL CITRATE 0.05 MG/ML IJ SOLN
INTRAMUSCULAR | Status: AC
  Filled 2013-01-23: qty 2

## 2013-01-23 MED ORDER — FUROSEMIDE 10 MG/ML IJ SOLN
40.0000 mg | Freq: Every day | INTRAMUSCULAR | Status: DC
  Administered 2013-01-23 – 2013-01-26 (×4): 40 mg via INTRAVENOUS
  Filled 2013-01-23 (×5): qty 4

## 2013-01-23 MED ORDER — DEXMEDETOMIDINE HCL IN NACL 200 MCG/50ML IV SOLN
0.2000 ug/kg/h | INTRAVENOUS | Status: DC
  Administered 2013-01-23: 0.3 ug/kg/h via INTRAVENOUS
  Administered 2013-01-23: 0.6 ug/kg/h via INTRAVENOUS
  Filled 2013-01-23: qty 50

## 2013-01-23 MED ORDER — SODIUM CHLORIDE 0.9 % IV SOLN
INTRAVENOUS | Status: DC
  Administered 2013-01-23 – 2013-01-26 (×3): 20 mL/h via INTRAVENOUS

## 2013-01-23 MED ORDER — SODIUM CHLORIDE 0.9 % IV BOLUS (SEPSIS): 250.0000 mL | Freq: Once | INTRAVENOUS | Status: DC

## 2013-01-23 MED ORDER — LORAZEPAM 2 MG/ML IJ SOLN
INTRAMUSCULAR | Status: AC
  Filled 2013-01-23: qty 1

## 2013-01-23 MED ORDER — JEVITY 1.2 CAL PO LIQD
1000.0000 mL | ORAL | Status: DC
  Administered 2013-01-23: 1000 mL
  Administered 2013-01-25: 20:00:00
  Filled 2013-01-23 (×4): qty 1000

## 2013-01-23 MED ORDER — ETOMIDATE 2 MG/ML IV SOLN
INTRAVENOUS | Status: AC
  Administered 2013-01-23: 20 mg
  Filled 2013-01-23: qty 20

## 2013-01-23 MED ORDER — LIDOCAINE HCL (CARDIAC) 20 MG/ML IV SOLN
INTRAVENOUS | Status: AC
  Filled 2013-01-23: qty 5

## 2013-01-23 MED ORDER — SODIUM CHLORIDE 0.9 % IV SOLN
INTRAVENOUS | Status: DC
Start: 2013-01-23 — End: 2013-01-31
  Administered 2013-01-23: 10 mL/h via INTRAVENOUS
  Administered 2013-01-29: via INTRAVENOUS

## 2013-01-23 MED ORDER — DEXMEDETOMIDINE HCL IN NACL 200 MCG/50ML IV SOLN
0.2000 ug/kg/h | INTRAVENOUS | Status: DC
  Administered 2013-01-23 (×2): 1.2 ug/kg/h via INTRAVENOUS
  Administered 2013-01-23: 1 ug/kg/h via INTRAVENOUS
  Filled 2013-01-23 (×4): qty 50

## 2013-01-23 NOTE — Procedures (Signed)
Extubation Procedure Note  Patient Details:   Name: [name] DOB: January 08, 1961 MRN: [id number]   Airway Documentation:     Evaluation  O2 sats: stable throughout Complications: No apparent complications Patient did tolerate procedure well. Bilateral Breath Sounds: Rhonchi;Diminished Suctioning: Airway Yes Vitals WNL  Gwendolyn Grant 01/23/2013, 12:33 PM

## 2013-01-23 NOTE — Progress Notes (Signed)
Progressive agitation, likely secondary to polysubstance withdrawal.  Refractory to Precedex gtt and intermittent benzodiazepines.  Concern for respiratory depression / hypercarbia as needs escalating doses of sedation.  Discussed with family: will re intubate and maintain sedated over the weekend.  Will reconsider trach vs extubation on Monday.  Critical care time 40 minutes.  Orlean Bradford, M.D. Pulmonary and Critical Care Medicine [facility] Pager: [phone]

## 2013-01-23 NOTE — Procedures (Signed)
Name: [name] MRN: [id number] DOB: 16-May-1961  DOS:   PROCEDURE NOTE  Procedure:  Endotracheal intubation.  Indication:  Acute respiratory failure  Consent:  Consent was implied due to the emergency nature of the procedure.  Anesthesia:  A total of 10 mg of Etomidate was given intravenously.  Procedure summary:  Appropriate equipment was assembled. The patient was identified as [name] and safety timeout was performed. The patient was placed supine, with head in sniffing position. After adequate level of anesthesia was achieved, a Mac 3 blade was inserted into the oropharynx and the vocal cords were visualized. A 7.5 endotracheal tube was inserted withhout difficulty and visualized going through the vocal cords. The stylette was removed and cuff inflated. Colorimetric change was noted on the CO2 meter. Breath sounds were heard over both lung fields equally. Post procedure chest xray was ordered.  Complications:  No immediate complications were noted.  Hemodynamic parameters and oxygenation remained stable throughout the procedure.    Orlean Bradford, M.D. Pulmonary and Critical Care Medicine [facility] Cell: [phone] Pager: [phone]  01/23/2013, 3:10 PM

## 2013-01-23 NOTE — Progress Notes (Signed)
[name], female   DOB: 1961-05-03, 52 y.o.   MRN: [id number]    SUBJECTIVE: Patient sedated this morning but per nursing she wakes up and follows commands when sedation is weaned.  Gets agitated with sedation weaning.  BP fluctuating considerably depending on level of agitation, low this morning (she is sedated).   Marland Kitchen ampicillin-sulbactam (UNASYN) IV  1.5 g Intravenous Q8H  . antiseptic oral rinse  15 mL Mouth Rinse QID  . carvedilol  3.125 mg Oral BID WC  . chlorhexidine  15 mL Mouth Rinse BID  . etomidate  40 mg Intravenous Once  . famotidine  20 mg Oral BID  . fentaNYL  200 mcg Intravenous Once  . heparin  5,000 Units Subcutaneous Q8H  . insulin aspart  0-9 Units Subcutaneous Q4H  . lisinopril  2.5 mg Oral BID  . midazolam  4 mg Intravenous Once  . potassium chloride  40 mEq Per Tube BID  . propofol  5-70 mcg/kg/min Intravenous Once  . vecuronium  10 mg Intravenous Once  Fentanyl gtt Dexmedetomidine gtt Versed gtt  Filed Vitals:   01/23/13 0630 01/23/13 0700 01/23/13 0800 01/23/13 0811  BP: 74/53 80/56 78/49  78/49  Pulse:    69  Temp: 97.7 F (36.5 C) 96.4 F (35.8 C) 96.1 F (35.6 C) 96.8 F (36 C)  TempSrc:      Resp: 14 14 14 24   Height:      Weight:      SpO2: 100% 100% 100% 100%    Intake/Output Summary (Last 24 hours) at 01/23/13 0836 Last data filed at 01/23/13 0630  Gross per 24 hour  Intake 2125.2 ml  Output   1945 ml  Net  180.2 ml    LABS: Basic Metabolic Panel:  Recent Labs  40/98/11 0410 01/23/13 0420  NA 136 133*  K 4.7 4.4  CL 100 98  CO2 27 25  GLUCOSE 191* 154*  BUN 12 14  CREATININE 0.62 0.70  CALCIUM 9.4 9.5  MG 2.2 2.5  PHOS 4.9* 6.1*   Liver Function Tests: No results found for this basename: AST, ALT, ALKPHOS, BILITOT, PROT, ALBUMIN,  in the last 72 hours No results found for this basename: LIPASE, AMYLASE,  in the last 72 hours CBC:  Recent Labs  01/22/13 0410 01/23/13 0420  WBC 9.6 9.0  NEUTROABS  5.2 4.0  HGB 8.7* 9.3*  HCT 25.7* 27.8*  MCV 89.2 88.8  PLT 221 251   Cardiac Enzymes: No results found for this basename: CKTOTAL, CKMB, CKMBINDEX, TROPONINI,  in the last 72 hours BNP: No components found with this basename: POCBNP,  D-Dimer: No results found for this basename: DDIMER,  in the last 72 hours Hemoglobin A1C: No results found for this basename: HGBA1C,  in the last 72 hours Fasting Lipid Panel: No results found for this basename: CHOL, HDL, LDLCALC, TRIG, CHOLHDL, LDLDIRECT,  in the last 72 hours Thyroid Function Tests:  Recent Labs  01/21/13 0830  TSH 1.222   Anemia Panel: No results found for this basename: VITAMINB12, FOLATE, FERRITIN, TIBC, IRON, RETICCTPCT,  in the last 72 hours  PHYSICAL EXAM General: intubated, sedated but opens eyes to stimulation.  Neck: No JVD, no thyromegaly or thyroid nodule.  Lungs: Dependent crackles CV: Nondisplaced PMI.  Heart regular S1/S2, no S3/S4, 2/6 HSM apex.  No peripheral edema.  Abdomen: Soft, no hepatosplenomegaly, no distention.  Extremities: No clubbing or cyanosis.   TELEMETRY: Reviewed telemetry pt in NSR  ASSESSMENT  AND PLAN: 52 yo with history of substance abuse had out of hospital arrest with defibrillation in the field.  Cath without significant CAD.  Now intubated. 1. CHF: Acute on chronic systolic CHF, EF 16% (nonischemic cardiomyopathy). Urine drug screen negative. Suspect CVP is not accurate. BP low in setting of heavy sedation.    - Would give Lasix IV dose today when BP is more stable.   - Continue to hold lisinopril until BP is higher.   - Consider cardiac MRI prior to discharge to assess for infiltrative disease.  - Check co-ox 2. Mitral regurgitation: Prominent murmur. Read as moderate on initial echo. May be functional. Repeat limited echo when optimized prior to discharge to reassess MR.  3. Cardiac arrest: If she has neurologic recovery, which it looks like she will, will need ICD prior to  discharge.  4. Agitation: History of ETOH, ? Withdrawing.  Will complicate vent weaning.   [name] 01/23/2013 8:36 AM

## 2013-01-23 NOTE — Progress Notes (Signed)
PULMONARY  / CRITICAL CARE MEDICINE  Name: [name] MRN: [id number] DOB: 22-Sep-1961    ADMISSION DATE:  01/16/2013 CONSULTATION DATE:  01/16/2013  REFERRING MD :  EDP PRIMARY SERVICE:  PCCM  CHIEF COMPLAINT:  Cardiac arrest  BRIEF PATIENT DESCRIPTION: 52 yo with history of drug abuse admitted after VF arrest treated with hypothermia protocol.  SIGNIFICANT EVENTS / STUDIES:  3/19  VF arrest, hypothermia protocol  3/19  Cardiac cath >>> no significant CAD 3/20  2D echo >>> EF 20%, diastolic dysfunction, MR  LINES / TUBES: OETT 3/20 >>> 3/27 L Bennett CVL 3/20 >>>   CULTURES: 3/20  Respiratory >>> neg  ANTIBIOTICS: Unasyn 3/20 >>> 3/27  INTERVAL HISTORY:  No acute overnight events.  VITAL SIGNS: Temp:  [96.1 F (35.6 C)-99.3 F (37.4 C)] 98.8 F (37.1 C) (03/27 1232) Pulse Rate:  [69-98] 98 (03/27 1232) Resp:  [14-32] 32 (03/27 1232) BP: (71-200)/(44-136) 128/64 mmHg (03/27 1232) SpO2:  [99 %-100 %] 99 % (03/27 1232) FiO2 (%):  [40 %] 40 % (03/27 0811)  HEMODYNAMICS: CVP:  [0 mmHg-12 mmHg] 0 mmHg  VENTILATOR SETTINGS: Vent Mode:  [-] PRVC FiO2 (%):  [40 %] 40 % Set Rate:  [14 bmp] 14 bmp Vt Set:  [500 mL] 500 mL PEEP:  [5 cmH20] 5 cmH20 Plateau Pressure:  [21 cmH20-26 cmH20] 21 cmH20  INTAKE / OUTPUT: Intake/Output     03/26 0701 - 03/27 0700 03/27 0701 - 03/28 0700   I.V. (mL/kg) 877.9 (15.8) 168.4 (3)   NG/GT 1175 50   IV Piggyback 150 250   Total Intake(mL/kg) 2202.9 (39.6) 468.4 (8.4)   Urine (mL/kg/hr) 2030 (1.5) 450 (1.4)   Total Output 2030 450   Net +172.9 +18.4        Emesis Occurrence 1 x      PHYSICAL EXAMINATION: General:  Appears comfortable, mechanically ventilated, synchronous Neuro:  Sleepy but arouses to stimulation, follows commands HEENT:  PERRL, OETT / OGT Cardiovascular:  RRR, no m/r/g Lungs:  Bilateral diminished air entry, systolic murmur Abdomen:  Soft, nontender, bowel sounds diminished Musculoskeletal:  Moves all  extremities, no edema Skin:  Intact  LABS:  Recent Labs Lab 01/16/13 1633  01/16/13 2001  01/20/13 0400 01/20/13 0858 01/20/13 1551 01/21/13 0410 01/22/13 0410 01/22/13 1730 01/23/13 0420  HGB 12.6  < >  --   < > 9.4*  --   --  9.9* 8.7*  --  9.3*  WBC  --   --   --   < > 7.2  --   --  10.0 9.6  --  9.0  PLT  --   --   --   < > 140*  --   --  181 221  --  251  NA 146*  < > 145  < > 141  --   --  141 136  --  133*  K 3.3*  < > 3.3*  < > 4.1  --   --  4.0 4.7  --  4.4  CL 113*  < > 112  < > 107  --   --  100 100  --  98  CO2  --   --  22  < > 24  --   --  29 27  --  25  GLUCOSE 207*  < > 166*  < > 166*  --   --  163* 191*  --  154*  BUN 12  < > 12  < >  9  --   --  10 12  --  14  CREATININE 0.50  < > 0.52  < > 0.60  --   --  0.73 0.62  --  0.70  CALCIUM  --   --  8.2*  < > 8.8  --   --  9.3 9.4  --  9.5  MG  --   --   --   < >  --   --   --  1.9 2.2  --  2.5  PHOS  --   --   --   < >  --   --   --  5.7* 4.9*  --  6.1*  AST  --   --   --   --  36  --   --   --   --   --   --   ALT  --   --   --   --  56*  --   --   --   --   --   --   ALKPHOS  --   --   --   --  63  --   --   --   --   --   --   BILITOT  --   --   --   --  0.6  --   --   --   --   --   --   PROT  --   --   --   --  6.4  --   --   --   --   --   --   ALBUMIN  --   --   --   --  2.6*  --   --   --   --   --   --   APTT  --   --  31  --   --   --   --   --   --  55*  --   INR  --   --  1.11  --   --   --   --   --   --  1.03  --   PROBNP  --   --   --   --  1935.0*  --   --  1206.0*  --   --   --   PHART 7.358  --   --   --   --  7.381 7.378  --   --   --   --   PCO2ART 36.3  --   --   --   --  40.6 43.6  --   --   --   --   PO2ART 133.0*  --   --   --   --  94.0 68.0*  --   --   --   --   < > = values in this interval not displayed.  Recent Labs Lab 01/22/13 1959 01/23/13 0018 01/23/13 0351 01/23/13 0805 01/23/13 1134  GLUCAP 122* 143* 151* 113* 149*   CXR:  None today  ASSESSMENT /  PLAN:  PULMONARY A:  Acute respiratory failure in setting of cardiac arrest.  Minimal oxygen requirements.  Appropriate Ve and mentation. P:   Hold tracheostomy Extubation trial, discussed with family Supplemental oxygen PRN Gaol SpO2>92  CARDIOVASCULAR A: VF cardiac arrest, suspected Cocaine-induced but drug screen negative.  Nonischemic cardiomyopathy. Acute systolic / diastolic CHF.  No hemodynamically significant CAD.  P:  Cardiology following May need ICD / heart MRI Restart Coreg May restart Lisinopril in AM  RENAL A:  No active issues. P:   Trend BMP Start Lasix 40 IV daily  GASTROINTESTINAL A:  No active issues. P:   Bedside Swallow evaluation when extubated D/c Pepcid  HEMATOLOGIC A:  Anemia, stable. P:  Trend CBC Heparin for DVT Px  INFECTIOUS A:  Suspected aspiration, completed 7 days of abx. P:   D/c Unasyn  ENDOCRINE  A:  Hyperglycemia. P:   SSI  NEUROLOGIC A:  Encephalopathy. Withdrawal? P:   Continue Precedex D/c Fentanyl / Versed.  TODAY'S SUMMARY: 52 yo with h/o drug abuse s/p VF cardiac arrest / hypothermia.  Nonischemic cardiomyopathy (EF 20%), on Lasix, Coreg, needs Lisinopril in AM.  Cardiac cath negative.  Extubated without immediate complications.  Encephalopathic, hyperventilated, possibly withdrawing - maintained on Precedex.  Need cardiac MRI / ICD before d/c.  Cardiology following.  I have personally obtained a history, examined the patient, evaluated laboratory and imaging results, formulated the assessment and plan and placed orders.  CRITICAL CARE:  The patient is critically ill with multiple organ systems failure and requires high complexity decision making for assessment and support, frequent evaluation and titration of therapies, application of advanced monitoring technologies and extensive interpretation of multiple databases. Critical Care Time devoted to patient care services described in this note is 50 minutes.    Lonia Farber, MD Pulmonary and Critical Care Medicine [facility] Pager: [phone]  01/23/2013, 12:46 PM

## 2013-01-24 DIAGNOSIS — F1010 Alcohol abuse, uncomplicated: Secondary | ICD-10-CM

## 2013-01-24 LAB — CBC WITH DIFFERENTIAL/PLATELET
Basophils Absolute: 0 10*3/uL (ref 0.0–0.1)
Basophils Relative: 0 % (ref 0–1)
Hemoglobin: 9.2 g/dL — ABNORMAL LOW (ref 12.0–15.0)
MCHC: 35.1 g/dL (ref 30.0–36.0)
Monocytes Relative: 9 % (ref 3–12)
Neutro Abs: 8.1 10*3/uL — ABNORMAL HIGH (ref 1.7–7.7)
Neutrophils Relative %: 58 % (ref 43–77)
WBC: 14.1 10*3/uL — ABNORMAL HIGH (ref 4.0–10.5)

## 2013-01-24 LAB — BASIC METABOLIC PANEL
CO2: 24 mEq/L (ref 19–32)
Calcium: 9.4 mg/dL (ref 8.4–10.5)
Chloride: 100 mEq/L (ref 96–112)
Creatinine, Ser: 1.11 mg/dL — ABNORMAL HIGH (ref 0.50–1.10)
GFR calc non Af Amer: 57 mL/min — ABNORMAL LOW (ref >90)
Glucose, Bld: 109 mg/dL — ABNORMAL HIGH (ref 70–99)
Potassium: 3.5 mEq/L (ref 3.5–5.1)

## 2013-01-24 LAB — GLUCOSE, CAPILLARY
Glucose-Capillary: 102 mg/dL — ABNORMAL HIGH (ref 70–99)
Glucose-Capillary: 116 mg/dL — ABNORMAL HIGH (ref 70–99)
Glucose-Capillary: 129 mg/dL — ABNORMAL HIGH (ref 70–99)
Glucose-Capillary: 135 mg/dL — ABNORMAL HIGH (ref 70–99)
Glucose-Capillary: 146 mg/dL — ABNORMAL HIGH (ref 70–99)
Glucose-Capillary: 93 mg/dL (ref 70–99)

## 2013-01-24 LAB — CARBOXYHEMOGLOBIN
Carboxyhemoglobin: 1.1 % (ref 0.5–1.5)
Methemoglobin: 0.9 % (ref 0.0–1.5)

## 2013-01-24 LAB — PHOSPHORUS: Phosphorus: 6.8 mg/dL — ABNORMAL HIGH (ref 2.3–4.6)

## 2013-01-24 MED ORDER — MIDAZOLAM HCL 2 MG/2ML IJ SOLN
2.0000 mg | INTRAMUSCULAR | Status: DC | PRN
  Administered 2013-01-24 (×3): 4 mg via INTRAVENOUS
  Filled 2013-01-24: qty 4
  Filled 2013-01-24: qty 2
  Filled 2013-01-24 (×2): qty 4

## 2013-01-24 MED ORDER — PRO-STAT SUGAR FREE PO LIQD
30.0000 mL | Freq: Every day | ORAL | Status: DC
  Administered 2013-01-24: 30 mL
  Filled 2013-01-24 (×4): qty 30

## 2013-01-24 MED ORDER — MIDAZOLAM HCL 2 MG/2ML IJ SOLN
2.0000 mg | Freq: Once | INTRAMUSCULAR | Status: AC
  Administered 2013-01-24: 2 mg via INTRAVENOUS

## 2013-01-24 MED ORDER — SODIUM CHLORIDE 0.9 % IV BOLUS (SEPSIS)
500.0000 mL | Freq: Once | INTRAVENOUS | Status: AC
  Administered 2013-01-24: 500 mL via INTRAVENOUS

## 2013-01-24 MED ORDER — POTASSIUM CHLORIDE 20 MEQ/15ML (10%) PO LIQD
40.0000 meq | Freq: Every day | ORAL | Status: AC
  Administered 2013-01-24 – 2013-01-25 (×2): 40 meq via ORAL
  Filled 2013-01-24 (×2): qty 30

## 2013-01-24 NOTE — Progress Notes (Signed)
eLink Physician-Brief Progress Note Patient Name: JAQUANDA WICKERSHAM DOB: May 03, 1961 MRN: [id number]  Date of Service  01/24/2013   HPI/Events of Note   Drop BP, dc precedex  eICU Interventions  Add int versed to fent drip   Intervention Category Major Interventions: Change in mental status - evaluation and management  Patricia Perales J. 01/24/2013, 12:29 AM

## 2013-01-24 NOTE — Progress Notes (Signed)
[name], female   DOB: [birth year], 52 y.o.   MRN: [id number]     SUBJECTIVE: Extubated yesterday for a couple of hours but re-intubated it appears due to agitation.  Per daughter she was a heavy drinker prior to her cardiac arrest.  This am, she is awake/alert on the vent.   Marland Kitchen antiseptic oral rinse  15 mL Mouth Rinse QID  . carvedilol  3.125 mg Oral BID WC  . chlorhexidine  15 mL Mouth Rinse BID  . furosemide  40 mg Intravenous Daily  . heparin  5,000 Units Subcutaneous Q8H  . insulin aspart  0-9 Units Subcutaneous Q4H  . pantoprazole (PROTONIX) IV  40 mg Intravenous Q24H  Fentanyl gtt Versed gtt  Filed Vitals:   01/24/13 0530 01/24/13 0600 01/24/13 0745 01/24/13 0800  BP: 82/49 80/49  123/70  Pulse:    96  Temp:   100 F (37.8 C)   TempSrc:   Oral   Resp: 22 14    Height:      Weight:      SpO2: 100% 100%      Intake/Output Summary (Last 24 hours) at 01/24/13 0810 Last data filed at 01/24/13 0600  Gross per 24 hour  Intake 1461.47 ml  Output   2100 ml  Net -638.53 ml    LABS: Basic Metabolic Panel:  Recent Labs  82/95/62 0420 01/24/13 0443  NA 133* 140  K 4.4 3.5  CL 98 100  CO2 25 24  GLUCOSE 154* 109*  BUN 14 25*  CREATININE 0.70 1.11*  CALCIUM 9.5 9.4  MG 2.5 2.7*  PHOS 6.1* 6.8*   Liver Function Tests: No results found for this basename: AST, ALT, ALKPHOS, BILITOT, PROT, ALBUMIN,  in the last 72 hours No results found for this basename: LIPASE, AMYLASE,  in the last 72 hours CBC:  Recent Labs  01/23/13 0420 01/24/13 0443  WBC 9.0 14.1*  NEUTROABS 4.0 8.1*  HGB 9.3* 9.2*  HCT 27.8* 26.2*  MCV 88.8 85.6  PLT 251 320   Cardiac Enzymes: No results found for this basename: CKTOTAL, CKMB, CKMBINDEX, TROPONINI,  in the last 72 hours BNP: No components found with this basename: POCBNP,  D-Dimer: No results found for this basename: DDIMER,  in the last 72 hours Hemoglobin A1C: No results found for this basename: HGBA1C,  in  the last 72 hours Fasting Lipid Panel: No results found for this basename: CHOL, HDL, LDLCALC, TRIG, CHOLHDL, LDLDIRECT,  in the last 72 hours Thyroid Function Tests:  Recent Labs  01/21/13 0830  TSH 1.222   Anemia Panel: No results found for this basename: VITAMINB12, FOLATE, FERRITIN, TIBC, IRON, RETICCTPCT,  in the last 72 hours  PHYSICAL EXAM General: intubated, awake/alert Neck: No JVD, no thyromegaly or thyroid nodule.  Lungs: Dependent crackles CV: Nondisplaced PMI.  Heart regular S1/S2, no S3/S4, 2/6 HSM apex.  No peripheral edema.  Abdomen: Soft, no hepatosplenomegaly, no distention.  Extremities: No clubbing or cyanosis.   TELEMETRY: Reviewed telemetry pt in NSR  ASSESSMENT AND PLAN: 52 yo with history of substance abuse had out of hospital arrest with defibrillation in the field.  Cath without significant CAD.  Now intubated. 1. CHF: Acute on chronic systolic CHF, EF 13% (nonischemic cardiomyopathy). Urine drug screen negative. She is a heavy drinker and may have an ETOH cardiomyopathy. BP low in setting of heavy sedation.   Co-ox 65% this morning.  - Agree with IV Lasix at current dosing to keep  I/Os negative.   - Continue Coreg at low dose as tolerated by BP.   - Consider cardiac MRI prior to discharge to assess for infiltrative disease.  2. Mitral regurgitation: Prominent murmur. Read as moderate on initial echo. May be functional. Repeat limited echo when optimized prior to discharge to reassess MR.  3. Cardiac arrest: Will need to consider ICD prior to discharge.  4. Agitation: Suspect withdrawal.  We will check in again on her on Monday unless called over the weekend.    [name] 01/24/2013 8:10 AM

## 2013-01-24 NOTE — Progress Notes (Signed)
PULMONARY  / CRITICAL CARE MEDICINE  Name: [name] MRN: [id number] DOB: 1961-06-11    ADMISSION DATE:  01/16/2013 CONSULTATION DATE:  01/16/2013  REFERRING MD :  EDP PRIMARY SERVICE:  PCCM  CHIEF COMPLAINT:  Cardiac arrest  BRIEF PATIENT DESCRIPTION: 52 yo with history of drug abuse admitted after VF arrest treated with hypothermia protocol.  SIGNIFICANT EVENTS / STUDIES:  3/19  VF arrest, hypothermia protocol  3/19  Cardiac cath >>> no significant CAD 3/20  2D echo >>> EF 20%, diastolic dysfunction, MR  LINES / TUBES: OETT 3/20 >>> 3/27 OETT 3/27 >>  L [place] CVL 3/20 >>>   CULTURES: 3/20  Respiratory >>> neg  ANTIBIOTICS: Unasyn 3/20 >>> 3/27  INTERVAL HISTORY:  Failed extubation 3/27 precedex stopped 3/28 am due to hypotension  VITAL SIGNS: Temp:  [97.5 F (36.4 C)-100.8 F (38.2 C)] 100 F (37.8 C) (03/28 0745) Pulse Rate:  [78-102] 96 (03/28 0800) Resp:  [14-50] 21 (03/28 0957) BP: (56-183)/(30-96) 81/56 mmHg (03/28 0957) SpO2:  [92 %-100 %] 100 % (03/28 0957) FiO2 (%):  [36 %-100 %] 40 % (03/28 0735) Weight:  [54.5 kg (120 lb 2.4 oz)] 54.5 kg (120 lb 2.4 oz) (03/28 0400)  HEMODYNAMICS:    VENTILATOR SETTINGS: Vent Mode:  [-] PRVC FiO2 (%):  [36 %-100 %] 40 % Set Rate:  [14 bmp] 14 bmp Vt Set:  [500 mL] 500 mL PEEP:  [5 cmH20] 5 cmH20 Plateau Pressure:  [20 cmH20-25 cmH20] 20 cmH20  INTAKE / OUTPUT: Intake/Output     03/27 0701 - 03/28 0700 03/28 0701 - 03/29 0700   I.V. (mL/kg) 1111.5 (20.4)    NG/GT 50    IV Piggyback 300    Total Intake(mL/kg) 1461.5 (26.8)    Urine (mL/kg/hr) 2100 (1.6)    Total Output 2100     Net -638.5            PHYSICAL EXAMINATION: General:  Appears comfortable, mechanically ventilated, synchronous Neuro:  Sleepy but arouses to stimulation, follows commands HEENT:  PERRL, OETT / OGT Cardiovascular:  RRR, no m/r/g Lungs:  Bilateral diminished air entry, systolic murmur Abdomen:  Soft, nontender, bowel sounds  diminished Musculoskeletal:  Moves all extremities, no edema Skin:  Intact  LABS:  Recent Labs Lab 01/20/13 0400  01/20/13 1551 01/21/13 0410 01/22/13 0410 01/22/13 1730 01/23/13 0420 01/23/13 1428 01/23/13 1430 01/24/13 0443  HGB 9.4*  --   --  9.9* 8.7*  --  9.3*  --   --  9.2*  WBC 7.2  --   --  10.0 9.6  --  9.0  --   --  14.1*  PLT 140*  --   --  181 221  --  251  --   --  320  NA 141  --   --  141 136  --  133*  --   --  140  K 4.1  --   --  4.0 4.7  --  4.4  --   --  3.5  CL 107  --   --  100 100  --  98  --   --  100  CO2 24  --   --  29 27  --  25  --   --  24  GLUCOSE 166*  --   --  163* 191*  --  154*  --   --  109*  BUN 9  --   --  10 12  --  14  --   --  25*  CREATININE 0.60  --   --  0.73 0.62  --  0.70  --   --  1.11*  CALCIUM 8.8  --   --  9.3 9.4  --  9.5  --   --  9.4  MG  --   --   --  1.9 2.2  --  2.5  --   --  2.7*  PHOS  --   --   --  5.7* 4.9*  --  6.1*  --   --  6.8*  AST 36  --   --   --   --   --   --   --   --   --   ALT 56*  --   --   --   --   --   --   --   --   --   ALKPHOS 63  --   --   --   --   --   --   --   --   --   BILITOT 0.6  --   --   --   --   --   --   --   --   --   PROT 6.4  --   --   --   --   --   --   --   --   --   ALBUMIN 2.6*  --   --   --   --   --   --   --   --   --   APTT  --   --   --   --   --  55*  --   --   --   --   INR  --   --   --   --   --  1.03  --   --   --   --   PROBNP 1935.0*  --   --  1206.0*  --   --   --   --   --   --   PHART  --   < > 7.378  --   --   --   --  7.299* 7.515*  --   PCO2ART  --   < > 43.6  --   --   --   --  55.5* 29.9*  --   PO2ART  --   < > 68.0*  --   --   --   --  65.0* 258.0*  --   < > = values in this interval not displayed.  Recent Labs Lab 01/23/13 1614 01/23/13 2008 01/23/13 2345 01/24/13 0344 01/24/13 0737  GLUCAP 127* 98 102* 93 116*   CXR:  3/27 >> basilar atx, ETT and CVC OK,   ASSESSMENT / PLAN:  PULMONARY A:  Acute respiratory failure in setting of cardiac  arrest.  Minimal oxygen requirements.  Failed extubation 3/27 largely due to agitation P:   Continue MV through the weekend Trach candidate, tentatively planned for Monday [name]) Wean sedating meds as able and assess for SBT daily  CARDIOVASCULAR A: VF cardiac arrest. Nonischemic cardiomyopathy. Acute systolic / diastolic CHF.  No hemodynamically significant CAD. P:  Cardiology following May need ICD / heart MRI to complete w/u Coreg + lasix as ordered Relative hypotension > IVF bolus x 1 on 3/28 Restart Lisinopril? Will defer this to cardiology, likely BP will not  tolerate while on sedating meds.   RENAL A:  hypokalemia P:   supplement K Trend BMP Lasix IV as ordered  GASTROINTESTINAL A:  No active issues. P:   PPI for SUP  HEMATOLOGIC A:  Anemia, stable. P:  Trend CBC Heparin for DVT Px  INFECTIOUS A:  Suspected aspiration, completed 7 days of abx. P:   D/c Unasyn 3/27  ENDOCRINE  A:  Hyperglycemia. P:   SSI  NEUROLOGIC A:  Encephalopathy. Withdrawal? P:   Fentanyl gtt + intermittent versed.   TODAY'S SUMMARY: 51 yo with h/o drug abuse s/p VF cardiac arrest / hypothermia.  Nonischemic cardiomyopathy (EF 20%), on Lasix, Coreg, needs Lisinopril in AM.  Cardiac cath negative.  Extubated without immediate complications.  Encephalopathic, hyperventilated, possibly withdrawing - maintained on Precedex.  Need cardiac MRI / ICD before d/c.  Cardiology following.  I have personally obtained a history, examined the patient, evaluated laboratory and imaging results, formulated the assessment and plan and placed orders.  CRITICAL CARE:  The patient is critically ill with multiple organ systems failure and requires high complexity decision making for assessment and support, frequent evaluation and titration of therapies, application of advanced monitoring technologies and extensive interpretation of multiple databases. Critical Care Time devoted to patient care services  described in this note is 50 minutes.   Leslye Peer., MD Pulmonary and Critical Care Medicine [facility] Pager: [phone]  01/24/2013, 11:14 AM

## 2013-01-24 NOTE — Progress Notes (Signed)
NUTRITION FOLLOW UP  Intervention:   1. Recommend advance Jevity 1.2 to 50 ml/hr, previous goal rate. Add 30 ml pro-stat daily. This will provide 1540 kcal, 82 gm protein, 968 ml free water.  2. Recommend consult nutrition for management of enteral nutrition.  3. RD will continue to follow    Nutrition Dx:   Inadequate oral intake related to inability to eat as evidenced by NPO diet. Ongoing   Goal:   Meet >/=90% estimated nutrition needs. Met   Monitor:   Vent status, TF tolerance, weight trends, labs   Assessment:   Pt was extubated on 3/27. Pt required reintubation with in hours of extubation related to aggitation. Per notes pt with hx of polysubstance abuse and possibly withdrawal symptoms exacerbating situation.  Planned to keep on vent through the weekend and readdress trach/extubation on Monday.   Pt remains with orders for Jevity 1.2 for enteral nutrition. If a trach is planned, recommend PEG at that time for continued nutrition support.   Patient has OG in place with tip of tube in stomach. Jevity 1.2 is infusing @ 30 ml/hr. Per RN pt TF was restarted this morning, currently advance back to goal rate. Previous tube feeding regimen was providing 1440 kcal, 67 grams protein, and 968 ml H2O. As pt was tolerating this rate previously, does not need to slowly advance to goal, recommend increase to 50 ml/hr.     Height: Ht Readings from Last 1 Encounters:  01/16/13 5\' 4"  (1.626 m)    Weight Status:   Wt Readings from Last 1 Encounters:  01/24/13 120 lb 2.4 oz (54.5 kg)   Patient is currently intubated on ventilator support.  MV: 10 Temp:Temp (24hrs), Avg:99.9 F (37.7 C), Min:97.9 F (36.6 C), Max:100.8 F (38.2 C)  Re-estimated needs:  Kcal: 1587 Protein: >/= 72 gm  Fluid: >/= 1.5 L   Skin: intact   Diet Order:  NPO   Intake/Output Summary (Last 24 hours) at 01/24/13 1137 Last data filed at 01/24/13 0600  Gross per 24 hour  Intake 1012.47 ml  Output   1775  ml  Net -762.53 ml    Last BM: unknown   Labs:   Recent Labs Lab 01/22/13 0410 01/23/13 0420 01/24/13 0443  NA 136 133* 140  K 4.7 4.4 3.5  CL 100 98 100  CO2 27 25 24   BUN 12 14 25*  CREATININE 0.62 0.70 1.11*  CALCIUM 9.4 9.5 9.4  MG 2.2 2.5 2.7*  PHOS 4.9* 6.1* 6.8*  GLUCOSE 191* 154* 109*    CBG (last 3)   Recent Labs  01/23/13 2345 01/24/13 0344 01/24/13 0737  GLUCAP 102* 93 116*    Scheduled Meds: . antiseptic oral rinse  15 mL Mouth Rinse QID  . carvedilol  3.125 mg Oral BID WC  . chlorhexidine  15 mL Mouth Rinse BID  . furosemide  40 mg Intravenous Daily  . heparin  5,000 Units Subcutaneous Q8H  . insulin aspart  0-9 Units Subcutaneous Q4H  . pantoprazole (PROTONIX) IV  40 mg Intravenous Q24H  . potassium chloride  40 mEq Oral Daily  . sodium chloride  500 mL Intravenous Once    Continuous Infusions: . sodium chloride 10 mL/hr (01/23/13 1300)  . sodium chloride 20 mL/hr (01/23/13 1337)  . feeding supplement (JEVITY 1.2 CAL) 1,000 mL (01/23/13 2000)  . fentaNYL infusion INTRAVENOUS 400 mcg/hr (01/24/13 0341)    Clarene Duke RD, LDN Pager [phone] After Hours pager [phone]

## 2013-01-25 LAB — PHOSPHORUS: Phosphorus: 4.8 mg/dL — ABNORMAL HIGH (ref 2.3–4.6)

## 2013-01-25 LAB — BASIC METABOLIC PANEL
Calcium: 9.4 mg/dL (ref 8.4–10.5)
GFR calc Af Amer: >90 mL/min (ref >90)
GFR calc non Af Amer: >90 mL/min (ref >90)
Glucose, Bld: 140 mg/dL — ABNORMAL HIGH (ref 70–99)
Potassium: 3.8 mEq/L (ref 3.5–5.1)
Sodium: 140 mEq/L (ref 135–145)

## 2013-01-25 LAB — CBC WITH DIFFERENTIAL/PLATELET
Basophils Relative: 0 % (ref 0–1)
Eosinophils Absolute: 0.2 10*3/uL (ref 0.0–0.7)
Eosinophils Relative: 2 % (ref 0–5)
Lymphs Abs: 2.8 10*3/uL (ref 0.7–4.0)
MCH: 29.7 pg (ref 26.0–34.0)
MCHC: 33.3 g/dL (ref 30.0–36.0)
MCV: 89.1 fL (ref 78.0–100.0)
Neutrophils Relative %: 66 % (ref 43–77)
Platelets: 363 10*3/uL (ref 150–400)
RDW: 14.7 % (ref 11.5–15.5)

## 2013-01-25 LAB — GLUCOSE, CAPILLARY
Glucose-Capillary: 165 mg/dL — ABNORMAL HIGH (ref 70–99)
Glucose-Capillary: 126 mg/dL — ABNORMAL HIGH (ref 70–99)
Glucose-Capillary: 115 mg/dL — ABNORMAL HIGH (ref 70–99)

## 2013-01-25 LAB — MAGNESIUM: Magnesium: 2.7 mg/dL — ABNORMAL HIGH (ref 1.5–2.5)

## 2013-01-25 MED ORDER — FENTANYL CITRATE 0.05 MG/ML IJ SOLN: 50.0000 ug | INTRAMUSCULAR | Status: DC | PRN

## 2013-01-25 MED ORDER — ONDANSETRON HCL 4 MG/2ML IJ SOLN
INTRAMUSCULAR | Status: AC
  Administered 2013-01-25: 4 mg via INTRAVENOUS
  Filled 2013-01-25: qty 2

## 2013-01-25 MED ORDER — ONDANSETRON HCL 4 MG/2ML IJ SOLN
4.0000 mg | Freq: Four times a day (QID) | INTRAMUSCULAR | Status: DC | PRN
  Administered 2013-01-26: 4 mg via INTRAVENOUS
  Filled 2013-01-25: qty 2

## 2013-01-25 MED ORDER — AMIODARONE HCL IN DEXTROSE 360-4.14 MG/200ML-% IV SOLN
INTRAVENOUS | Status: AC
  Filled 2013-01-25: qty 200

## 2013-01-25 NOTE — Progress Notes (Signed)
PULMONARY  / CRITICAL CARE MEDICINE  Name: [name] MRN: [id number] DOB: August 28, 1961    ADMISSION DATE:  01/16/2013 CONSULTATION DATE:  01/16/2013  REFERRING MD :  EDP PRIMARY SERVICE:  PCCM  CHIEF COMPLAINT:  Cardiac arrest  BRIEF PATIENT DESCRIPTION: 52 yo with history of drug abuse admitted after VF arrest treated with hypothermia protocol.  SIGNIFICANT EVENTS / STUDIES:  3/19  VF arrest, hypothermia protocol  3/19  Cardiac cath >>> no significant CAD 3/20  2D echo >>> EF 20%, diastolic dysfunction, MR  LINES / TUBES: OETT 3/20 >>> 3/27 OETT 3/27 >>  L [place] CVL 3/20 >>>   CULTURES: 3/20  Respiratory >>> neg  ANTIBIOTICS: Unasyn 3/20 >>> 3/27  INTERVAL HISTORY:  Failed extubation 3/27 precedex stopped 3/28 am due to hypotension  VITAL SIGNS: Temp:  [98.2 F (36.8 C)-99.6 F (37.6 C)] 99.1 F (37.3 C) (03/29 0752) Pulse Rate:  [93-109] 107 (03/29 0836) Resp:  [14-34] 27 (03/29 0836) BP: (81-164)/(42-87) 138/70 mmHg (03/29 0836) SpO2:  [100 %] 100 % (03/29 0836) FiO2 (%):  [40 %] 40 % (03/29 0836) Weight:  [54.6 kg (120 lb 5.9 oz)] 54.6 kg (120 lb 5.9 oz) (03/29 0416)  HEMODYNAMICS:    VENTILATOR SETTINGS: Vent Mode:  [-] PRVC FiO2 (%):  [40 %] 40 % Set Rate:  [14 bmp] 14 bmp Vt Set:  [500 mL] 500 mL PEEP:  [5 cmH20] 5 cmH20 Plateau Pressure:  [19 cmH20-25 cmH20] 20 cmH20  INTAKE / OUTPUT: Intake/Output     03/28 0701 - 03/29 0700 03/29 0701 - 03/30 0700   I.V. (mL/kg) 1970 (36.1) 60 (1.1)   NG/GT 1340 50   IV Piggyback     Total Intake(mL/kg) 3310 (60.6) 110 (2)   Urine (mL/kg/hr) 1785 (1.4)    Total Output 1785     Net +1525 +110          PHYSICAL EXAMINATION: General:  Appears comfortable, mechanically ventilated, synchronous Neuro:  Sleepy but arouses to stimulation, follows commands HEENT:  PERRL, OETT / OGT Cardiovascular:  RRR, no m/r/g Lungs:  Bilateral diminished air entry, systolic murmur Abdomen:  Soft, nontender, bowel sounds  diminished Musculoskeletal:  Moves all extremities, no edema Skin:  Intact  LABS:  Recent Labs Lab 01/20/13 0400  01/20/13 1551 01/21/13 0410  01/22/13 1730 01/23/13 0420 01/23/13 1428 01/23/13 1430 01/24/13 0443 01/25/13 0440  HGB 9.4*  --   --  9.9*  < >  --  9.3*  --   --  9.2* 8.7*  WBC 7.2  --   --  10.0  < >  --  9.0  --   --  14.1* 11.7*  PLT 140*  --   --  181  < >  --  251  --   --  320 363  NA 141  --   --  141  < >  --  133*  --   --  140 140  K 4.1  --   --  4.0  < >  --  4.4  --   --  3.5 3.8  CL 107  --   --  100  < >  --  98  --   --  100 104  CO2 24  --   --  29  < >  --  25  --   --  24 25  GLUCOSE 166*  --   --  163*  < >  --  154*  --   --  109* 140*  BUN 9  --   --  10  < >  --  14  --   --  25* 26*  CREATININE 0.60  --   --  0.73  < >  --  0.70  --   --  1.11* 0.64  CALCIUM 8.8  --   --  9.3  < >  --  9.5  --   --  9.4 9.4  MG  --   --   --  1.9  < >  --  2.5  --   --  2.7* 2.7*  PHOS  --   --   --  5.7*  < >  --  6.1*  --   --  6.8* 4.8*  AST 36  --   --   --   --   --   --   --   --   --   --   ALT 56*  --   --   --   --   --   --   --   --   --   --   ALKPHOS 63  --   --   --   --   --   --   --   --   --   --   BILITOT 0.6  --   --   --   --   --   --   --   --   --   --   PROT 6.4  --   --   --   --   --   --   --   --   --   --   ALBUMIN 2.6*  --   --   --   --   --   --   --   --   --   --   APTT  --   --   --   --   --  55*  --   --   --   --   --   INR  --   --   --   --   --  1.03  --   --   --   --   --   PROBNP 1935.0*  --   --  1206.0*  --   --   --   --   --   --   --   PHART  --   < > 7.378  --   --   --   --  7.299* 7.515*  --   --   PCO2ART  --   < > 43.6  --   --   --   --  55.5* 29.9*  --   --   PO2ART  --   < > 68.0*  --   --   --   --  65.0* 258.0*  --   --   < > = values in this interval not displayed.  Recent Labs Lab 01/24/13 0737 01/24/13 1258 01/24/13 1616 01/24/13 2000 01/24/13 2326  GLUCAP 116* 144* 129* 135* 146*    Imaging: Dg Chest Port 1 View  01/23/2013  *RADIOLOGY REPORT*  Clinical Data: Respiratory difficulty  PORTABLE CHEST - 1 VIEW  Comparison: Yesterday  Findings: Endotracheal tube tip remains 6 mm above the carina and should be retracted.  NG tube, left subclavian center  venous catheter are stable.  Bibasilar atelectasis increased.  Upper normal heart size.  Upper lungs clear.  No pneumothorax  IMPRESSION: Increased bibasilar atelectasis.  Endotracheal tube tip just above the carina.   Original Report Authenticated By: Jolaine Click, M.D.    Dg Abd Portable 1v  01/23/2013  *RADIOLOGY REPORT*  Clinical Data: NG tube placement  PORTABLE ABDOMEN - 1 VIEW  Comparison: None.  Findings: The NG tube is coiled in the stomach and the tip is in the gastric antrum or duodenal bulb.  No disproportionate dilatation of bowel.  Moderate stool burden in the ascending colon.  IMPRESSION: NG tube tip is either in the antrum of the stomach or duodenal bulb region.   Original Report Authenticated By: Jolaine Click, M.D.       ASSESSMENT / PLAN:  PULMONARY A:  Acute respiratory failure in setting of cardiac arrest.  Minimal oxygen requirements.  Failed extubation 3/27 largely due to agitation P:   Continue MV through the weekend Trach candidate, tentatively planned for Monday [name]) Wean sedating meds as able and assess for SBT daily 3-29 tenuous for weaning  CARDIOVASCULAR A: VF cardiac arrest. Nonischemic cardiomyopathy. Acute systolic / diastolic CHF.  No hemodynamically significant CAD. P:  Cardiology following May need ICD / heart MRI to complete w/u Coreg + lasix as ordered Relative hypotension > IVF bolus x 1 on 3/28 Restart Lisinopril? Will defer this to cardiology, likely BP will not tolerate while on sedating meds.   RENAL  Recent Labs Lab 01/23/13 0420 01/24/13 0443 01/25/13 0440  K 4.4 3.5 3.8    A:  hypokalemia P:   supplement K Trend BMP Lasix IV as ordered  GASTROINTESTINAL A:  No  active issues. P:   PPI for SUP  HEMATOLOGIC  Recent Labs  01/24/13 0443 01/25/13 0440  HGB 9.2* 8.7*    A:  Anemia, stable. P:  Trend CBC Heparin for DVT Px  INFECTIOUS A:  Suspected aspiration, completed 7 days of abx. P:   D/c Unasyn 3/27  ENDOCRINE  CBG (last 3)   Recent Labs  01/24/13 1616 01/24/13 2000 01/24/13 2326  GLUCAP 129* 135* 146*     A:  Hyperglycemia. P:   SSI  NEUROLOGIC A:  Encephalopathy. Withdrawal?awake and follows commands 3-29 P:   Fentanyl gtt + intermittent versed.   TODAY'S SUMMARY: 52 yo with h/o drug abuse s/p VF cardiac arrest / hypothermia.  Nonischemic cardiomyopathy (EF 20%), on Lasix, Coreg, needs Lisinopril in AM.  Cardiac cath negative.  Extubated without immediate complications.  Encephalopathic, hyperventilated, possibly withdrawing - maintained on Precedex.  Need cardiac MRI / ICD before d/c.  Cardiology following. 3/29 no wean   Brett Canales Minor ACNP [facility] PCCM Pager [phone] till 3 pm If no answer page [phone] 01/25/2013, 9:04 AM  CC time 35 min.  Patient seen and examined, agree with above note.  I dictated the care and orders written for this patient under my direction.  Alyson Reedy, MD [phone]

## 2013-01-25 NOTE — Progress Notes (Signed)
80cc fentanyl drip 10 mcg/ml wasted in sink followed by water flush by 2 RN, myself and Riccardo Dubin RN

## 2013-01-25 NOTE — Progress Notes (Signed)
Pt indicated she needed to have a bowel movement.  While placing on bedpan, began to vomit.  Tube feedings stopped, og placed to suction, yankaur used to suction mouth. Vomited 350cc tan/yellow material.  Dr. Molli Knock notified, orders received.  Will continue to monitor pt closely.

## 2013-01-26 ENCOUNTER — Inpatient Hospital Stay (HOSPITAL_COMMUNITY): Payer: 59

## 2013-01-26 LAB — CBC WITH DIFFERENTIAL/PLATELET
Basophils Relative: 0 % (ref 0–1)
Eosinophils Absolute: 0 10*3/uL (ref 0.0–0.7)
HCT: 28.9 % — ABNORMAL LOW (ref 36.0–46.0)
Hemoglobin: 9.7 g/dL — ABNORMAL LOW (ref 12.0–15.0)
Lymphocytes Relative: 16 % (ref 12–46)
Lymphs Abs: 3.3 10*3/uL (ref 0.7–4.0)
MCH: 29.6 pg (ref 26.0–34.0)
MCHC: 33.6 g/dL (ref 30.0–36.0)
MCV: 88.1 fL (ref 78.0–100.0)
Neutro Abs: 16.6 10*3/uL — ABNORMAL HIGH (ref 1.7–7.7)

## 2013-01-26 LAB — GLUCOSE, CAPILLARY
Glucose-Capillary: 141 mg/dL — ABNORMAL HIGH (ref 70–99)
Glucose-Capillary: 106 mg/dL — ABNORMAL HIGH (ref 70–99)
Glucose-Capillary: 110 mg/dL — ABNORMAL HIGH (ref 70–99)
Glucose-Capillary: 112 mg/dL — ABNORMAL HIGH (ref 70–99)

## 2013-01-26 LAB — BLOOD GAS, ARTERIAL
Drawn by: 270271
FIO2: 0.5 %
pCO2 arterial: 32.4 mmHg — ABNORMAL LOW (ref 35.0–45.0)
pH, Arterial: 7.482 — ABNORMAL HIGH (ref 7.350–7.450)
pO2, Arterial: 43.8 mmHg — ABNORMAL LOW (ref 80.0–100.0)

## 2013-01-26 LAB — PHOSPHORUS: Phosphorus: 5.4 mg/dL — ABNORMAL HIGH (ref 2.3–4.6)

## 2013-01-26 LAB — BASIC METABOLIC PANEL
CO2: 22 mEq/L (ref 19–32)
Chloride: 102 mEq/L (ref 96–112)
Creatinine, Ser: 0.76 mg/dL (ref 0.50–1.10)
Glucose, Bld: 118 mg/dL — ABNORMAL HIGH (ref 70–99)

## 2013-01-26 LAB — MAGNESIUM: Magnesium: 3 mg/dL — ABNORMAL HIGH (ref 1.5–2.5)

## 2013-01-26 NOTE — Procedures (Signed)
Extubation Procedure Note  Patient Details:   Name: [name] DOB: Nov 12, 1960 MRN: [id number]   Airway Documentation:     Evaluation  O2 sats: stable throughout Complications: No apparent complications Patient did tolerate procedure well. Bilateral Breath Sounds: Rhonchi Suctioning: Oral;Airway Yes  Patient's airway and mouth suctioned out throughly, patient extubated and placed on 3LNC. Patient has bilateral breathsounds and no stridor is present. HR 1-4 100% 129/87   Adolm Joseph 01/26/2013, 10:50 AM

## 2013-01-26 NOTE — Progress Notes (Signed)
PULMONARY  / CRITICAL CARE MEDICINE  Name: [name] MRN: [id number] DOB: [birth year]    ADMISSION DATE:  01/16/2013 CONSULTATION DATE:  01/16/2013  REFERRING MD :  EDP PRIMARY SERVICE:  PCCM  CHIEF COMPLAINT:  Cardiac arrest  BRIEF PATIENT DESCRIPTION: 52 yo with history of drug abuse admitted after VF arrest treated with hypothermia protocol.  SIGNIFICANT EVENTS / STUDIES:  3/19  VF arrest, hypothermia protocol  3/19  Cardiac cath >>> no significant CAD 3/20  2D echo >>> EF 20%, diastolic dysfunction, MR  LINES / TUBES: OETT 3/20 >>> 3/27 OETT 3/27 >>  L [place] CVL 3/20 >>>   CULTURES: 3/20  Respiratory >>> neg  ANTIBIOTICS: Unasyn 3/20 >>> 3/27  INTERVAL HISTORY:  Failed extubation 3/27 precedex stopped 3/28 am due to hypotension  VITAL SIGNS: Temp:  [98.4 F (36.9 C)-99.2 F (37.3 C)] 98.8 F (37.1 C) (03/30 0800) Pulse Rate:  [92-112] 112 (03/30 0722) Resp:  [13-32] 23 (03/30 0800) BP: (93-134)/(47-93) 133/93 mmHg (03/30 0800) SpO2:  [100 %] 100 % (03/30 0800) FiO2 (%):  [40 %] 40 % (03/30 0800) Weight:  [53 kg (116 lb 13.5 oz)] 53 kg (116 lb 13.5 oz) (03/30 0400)  HEMODYNAMICS: CVP:  [4 mmHg] 4 mmHg  VENTILATOR SETTINGS: Vent Mode:  [-] CPAP;PSV FiO2 (%):  [40 %] 40 % Set Rate:  [14 bmp] 14 bmp Vt Set:  [500 mL] 500 mL PEEP:  [5 cmH20] 5 cmH20 Pressure Support:  [10 cmH20] 10 cmH20 Plateau Pressure:  [18 cmH20-20 cmH20] 18 cmH20  INTAKE / OUTPUT: Intake/Output     03/29 0701 - 03/30 0700 03/30 0701 - 03/31 0700   I.V. (mL/kg) 980 (18.5) 40 (0.8)   Other 30    NG/GT 625 30   Total Intake(mL/kg) 1635 (30.8) 70 (1.3)   Urine (mL/kg/hr) 1755 (1.4)    Emesis/NG output 350 (0.3)    Total Output 2105     Net -470 +70        Emesis Occurrence 2 x      PHYSICAL EXAMINATION: General:  Appears comfortable, mechanically ventilated, synchronous Neuro:  wide awake HEENT:  PERRL, OETT / OGT Cardiovascular:  RRR, no m/r/g Lungs:  Bilateral  diminished air entry, systolic murmur Abdomen:  Soft, nontender, bowel sounds diminished Musculoskeletal:  Moves all extremities, no edema Skin:  Intact  LABS:  Recent Labs Lab 01/20/13 0400  01/20/13 1551 01/21/13 0410  01/22/13 1730  01/23/13 1428 01/23/13 1430 01/24/13 0443 01/25/13 0440 01/26/13 0430  HGB 9.4*  --   --  9.9*  < >  --   < >  --   --  9.2* 8.7* 9.7*  WBC 7.2  --   --  10.0  < >  --   < >  --   --  14.1* 11.7* 20.9*  PLT 140*  --   --  181  < >  --   < >  --   --  320 363 538*  NA 141  --   --  141  < >  --   < >  --   --  140 140 142  K 4.1  --   --  4.0  < >  --   < >  --   --  3.5 3.8 4.0  CL 107  --   --  100  < >  --   < >  --   --  100 104 102  CO2 24  --   --  29  < >  --   < >  --   --  24 25 22   GLUCOSE 166*  --   --  163*  < >  --   < >  --   --  109* 140* 118*  BUN 9  --   --  10  < >  --   < >  --   --  25* 26* 34*  CREATININE 0.60  --   --  0.73  < >  --   < >  --   --  1.11* 0.64 0.76  CALCIUM 8.8  --   --  9.3  < >  --   < >  --   --  9.4 9.4 9.9  MG  --   --   --  1.9  < >  --   < >  --   --  2.7* 2.7* 3.0*  PHOS  --   --   --  5.7*  < >  --   < >  --   --  6.8* 4.8* 5.4*  AST 36  --   --   --   --   --   --   --   --   --   --   --   ALT 56*  --   --   --   --   --   --   --   --   --   --   --   ALKPHOS 63  --   --   --   --   --   --   --   --   --   --   --   BILITOT 0.6  --   --   --   --   --   --   --   --   --   --   --   PROT 6.4  --   --   --   --   --   --   --   --   --   --   --   ALBUMIN 2.6*  --   --   --   --   --   --   --   --   --   --   --   APTT  --   --   --   --   --  55*  --   --   --   --   --   --   INR  --   --   --   --   --  1.03  --   --   --   --   --   --   PROBNP 1935.0*  --   --  1206.0*  --   --   --   --   --   --   --   --   PHART  --   < > 7.378  --   --   --   --  7.299* 7.515*  --   --   --   PCO2ART  --   < > 43.6  --   --   --   --  55.5* 29.9*  --   --   --   PO2ART  --   < > 68.0*  --   --   --    --  65.0*  258.0*  --   --   --   < > = values in this interval not displayed.  Recent Labs Lab 01/25/13 1630 01/25/13 2002 01/25/13 2311 01/26/13 0423 01/26/13 0737  GLUCAP 126* 116* 115* 112* 141*   Imaging: Dg Chest Port 1 View  01/26/2013  *RADIOLOGY REPORT*  Clinical Data: Intubated, respiratory failure, substance abuse  PORTABLE CHEST - 1 VIEW  Comparison: 01/23/2013  Findings: Low endotracheal tube approximate 6 mm above the carina as before.  This could be retracted 2 cm.  Normal heart size and vascularity.  The left subclavian central line tip in the SVC. Minimal left base atelectasis otherwise clear lungs.  No edema, collapse, consolidation, effusion or pneumothorax.  Atherosclerosis of the aorta.  NG tube coiled the stomach.  IMPRESSION: Minimal left base atelectasis.  Overall improving aeration.  Low endotracheal tube just above the carina could be retracted 2 cm.   Original Report Authenticated By: Judie Petit. Shick, M.D.       ASSESSMENT / PLAN:  PULMONARY A:  Acute respiratory failure in setting of cardiac arrest.  Minimal oxygen requirements.  Failed extubation 3/27 largely due to agitation. P:   Continue MV through the weekend Trach candidate, tentatively planned for Monday (JY) Wean sedating meds as able and assess for SBT daily 3-30 looks ok on 10 PS. Number wise she might be weanable. Clinically I doubt she will stay extubated but it is not unreasonable to attempt extubation again 3-30. If she fails then there is no doubt she needs trach.  CARDIOVASCULAR  Intake/Output Summary (Last 24 hours) at 01/26/13 0846 Last data filed at 01/26/13 0800  Gross per 24 hour  Intake   1565 ml  Output   2035 ml  Net   -470 ml    A: VF cardiac arrest. Nonischemic cardiomyopathy. Acute systolic / diastolic CHF.  No hemodynamically significant CAD. P:  Cardiology following May need ICD / heart MRI to complete w/u Coreg + lasix as ordered Relative hypotension > IVF bolus x 1 on  3/28 Restart Lisinopril? Will defer this to cardiology, likely BP will not tolerate while on sedating meds.   RENAL  Recent Labs Lab 01/24/13 0443 01/25/13 0440 01/26/13 0430  K 3.5 3.8 4.0    A:  hypokalemia P:   supplement K Trend BMP Lasix IV as ordered  GASTROINTESTINAL A:  No active issues. P:   PPI for SUP  HEMATOLOGIC  Recent Labs  01/25/13 0440 01/26/13 0430  HGB 8.7* 9.7*    A:  Anemia, stable. P:  Trend CBC Heparin for DVT Px  INFECTIOUS A:  Suspected aspiration, completed 7 days of abx. P:   D/c Unasyn 3/27  ENDOCRINE  CBG (last 3)   Recent Labs  01/25/13 2311 01/26/13 0423 01/26/13 0737  GLUCAP 115* 112* 141*     A:  Hyperglycemia. P:   SSI  NEUROLOGIC A:  Encephalopathy. Withdrawal?awake and follows commands 3-30 P:   Fentanyl gtt + intermittent versed.   TODAY'S SUMMARY: 52 yo with h/o drug abuse s/p VF cardiac arrest / hypothermia.  Nonischemic cardiomyopathy (EF 20%), on Lasix, Coreg, needs Lisinopril in AM.  Cardiac cath negative.  Extubated without immediate complications.  Encephalopathic, hyperventilated, possibly withdrawing - maintained on Precedex.  Need cardiac MRI / ICD before d/c.  Cardiology following. 3/29 no wean  3/30 consider extubation. If fails again then no doubt she needs a trach.  Brett Canales Minor ACNP [facility] PCCM Pager [phone] till 3 pm If no answer  page [phone] 01/26/2013, 8:41 AM  Patient appears much more interactive and cooperative this AM.  Will extubate, if fails then will reintubate with intention to trach early this week.  But patient is much more cooperative than before.  Otherwise will continue current care.  CC time 35 min.  Patient seen and examined, agree with above note.  I dictated the care and orders written for this patient under my direction.  Alyson Reedy, MD [phone]

## 2013-01-26 NOTE — Progress Notes (Signed)
Dr.Siqueiros CCM in to see patient related to increase respirations in the last hour with rate up to 40, respirations are rapid and shallow. Patient denies shortness of breath or pain, very quiet though. Patient on vent. Mask at 30%. RT called for stat ABG's and need for bipap.

## 2013-01-26 NOTE — Progress Notes (Signed)
Dr.Siqueiros back in to see patient. Aware of ABG's. Patient tolerating bipap at this time. Will continue to monitor closely. MD is fine to decrease FIO2 but to maintain sats at 94% or better.

## 2013-01-26 NOTE — Progress Notes (Signed)
SUBJECTIVE:  No chest pain.  Intubated but wide awake.    PHYSICAL EXAM Filed Vitals:   01/26/13 0433 01/26/13 0500 01/26/13 0600 01/26/13 0700  BP:  113/81 120/71 123/82  Pulse: 100     Temp:      TempSrc:      Resp: 16 14 19 14   Height:      Weight:      SpO2: 100% 100% 100% 100%   General:  No distress Lungs:  Clear Heart:  RRR, tachy  Abdomen:  Positive bowel sounds, no rebound no guarding Extremities:  No edema  LABS: No results found for this basename: CKTOTAL, CKMB, CKMBINDEX, TROPONINI   Results for orders placed during the hospital encounter of 01/16/13 (from the past 24 hour(s))  GLUCOSE, CAPILLARY     Status: Abnormal   Collection Time    01/25/13  7:46 AM      Result Value Range   Glucose-Capillary 165 (*) 70 - 99 mg/dL  GLUCOSE, CAPILLARY     Status: Abnormal   Collection Time    01/25/13 12:00 PM      Result Value Range   Glucose-Capillary 142 (*) 70 - 99 mg/dL  GLUCOSE, CAPILLARY     Status: Abnormal   Collection Time    01/25/13  4:30 PM      Result Value Range   Glucose-Capillary 126 (*) 70 - 99 mg/dL  GLUCOSE, CAPILLARY     Status: Abnormal   Collection Time    01/25/13  8:02 PM      Result Value Range   Glucose-Capillary 116 (*) 70 - 99 mg/dL  GLUCOSE, CAPILLARY     Status: Abnormal   Collection Time    01/25/13 11:11 PM      Result Value Range   Glucose-Capillary 115 (*) 70 - 99 mg/dL  GLUCOSE, CAPILLARY     Status: Abnormal   Collection Time    01/26/13  4:23 AM      Result Value Range   Glucose-Capillary 112 (*) 70 - 99 mg/dL  CBC WITH DIFFERENTIAL     Status: Abnormal   Collection Time    01/26/13  4:30 AM      Result Value Range   WBC 20.9 (*) 4.0 - 10.5 K/uL   RBC 3.28 (*) 3.87 - 5.11 MIL/uL   Hemoglobin 9.7 (*) 12.0 - 15.0 g/dL   HCT 91.4 (*) 78.2 - 95.6 %   MCV 88.1  78.0 - 100.0 fL   MCH 29.6  26.0 - 34.0 pg   MCHC 33.6  30.0 - 36.0 g/dL   RDW 21.3  08.6 - 57.8 %   Platelets 538 (*) 150 - 400 K/uL   Neutrophils  Relative 79 (*) 43 - 77 %   Lymphocytes Relative 16  12 - 46 %   Monocytes Relative 5  3 - 12 %   Eosinophils Relative 0  0 - 5 %   Basophils Relative 0  0 - 1 %   Neutro Abs 16.6 (*) 1.7 - 7.7 K/uL   Lymphs Abs 3.3  0.7 - 4.0 K/uL   Monocytes Absolute 1.0  0.1 - 1.0 K/uL   Eosinophils Absolute 0.0  0.0 - 0.7 K/uL   Basophils Absolute 0.0  0.0 - 0.1 K/uL   RBC Morphology POLYCHROMASIA PRESENT     WBC Morphology ATYPICAL LYMPHOCYTES     Smear Review LARGE PLATELETS PRESENT    BASIC METABOLIC PANEL     Status: Abnormal  Collection Time    01/26/13  4:30 AM      Result Value Range   Sodium 142  135 - 145 mEq/L   Potassium 4.0  3.5 - 5.1 mEq/L   Chloride 102  96 - 112 mEq/L   CO2 22  19 - 32 mEq/L   Glucose, Bld 118 (*) 70 - 99 mg/dL   BUN 34 (*) 6 - 23 mg/dL   Creatinine, Ser 1.61  0.50 - 1.10 mg/dL   Calcium 9.9  8.4 - 09.6 mg/dL   GFR calc non Af Amer >90  >90 mL/min   GFR calc Af Amer >90  >90 mL/min  MAGNESIUM     Status: Abnormal   Collection Time    01/26/13  4:30 AM      Result Value Range   Magnesium 3.0 (*) 1.5 - 2.5 mg/dL  PHOSPHORUS     Status: Abnormal   Collection Time    01/26/13  4:30 AM      Result Value Range   Phosphorus 5.4 (*) 2.3 - 4.6 mg/dL    Intake/Output Summary (Last 24 hours) at 01/26/13 0737 Last data filed at 01/26/13 0600  Gross per 24 hour  Intake   1565 ml  Output   2105 ml  Net   -540 ml    ASSESSMENT AND PLAN:  CHF:  (Nonischemic cardiomyopathy):  BP labile.  We will probably start a low dose ACE after extubation.    Cardiac Arrest:   VF arrest.  EP to follow as she recovers for consideration of ICD.   Possible extubation.    Rollene Rotunda 01/26/2013 7:37 AM

## 2013-01-26 NOTE — Progress Notes (Signed)
E-link MD made aware of ABG results and that bipap is in place at 100% at this time. Continue to monitor closely

## 2013-01-27 ENCOUNTER — Inpatient Hospital Stay (HOSPITAL_COMMUNITY): Payer: 59

## 2013-01-27 LAB — CBC WITH DIFFERENTIAL/PLATELET
Basophils Absolute: 0 10*3/uL (ref 0.0–0.1)
Eosinophils Absolute: 0 10*3/uL (ref 0.0–0.7)
HCT: 30.2 % — ABNORMAL LOW (ref 36.0–46.0)
Lymphocytes Relative: 15 % (ref 12–46)
MCHC: 33.4 g/dL (ref 30.0–36.0)
Monocytes Relative: 4 % (ref 3–12)
Neutro Abs: 26.7 10*3/uL — ABNORMAL HIGH (ref 1.7–7.7)
Platelets: 735 10*3/uL — ABNORMAL HIGH (ref 150–400)
RDW: 15.1 % (ref 11.5–15.5)
WBC: 33 10*3/uL — ABNORMAL HIGH (ref 4.0–10.5)

## 2013-01-27 LAB — GLUCOSE, CAPILLARY
Glucose-Capillary: 100 mg/dL — ABNORMAL HIGH (ref 70–99)
Glucose-Capillary: 108 mg/dL — ABNORMAL HIGH (ref 70–99)
Glucose-Capillary: 109 mg/dL — ABNORMAL HIGH (ref 70–99)
Glucose-Capillary: 111 mg/dL — ABNORMAL HIGH (ref 70–99)
Glucose-Capillary: 117 mg/dL — ABNORMAL HIGH (ref 70–99)

## 2013-01-27 LAB — PHOSPHORUS: Phosphorus: 5.7 mg/dL — ABNORMAL HIGH (ref 2.3–4.6)

## 2013-01-27 LAB — BASIC METABOLIC PANEL
BUN: 34 mg/dL — ABNORMAL HIGH (ref 6–23)
Calcium: 10 mg/dL (ref 8.4–10.5)
GFR calc Af Amer: >90 mL/min (ref >90)
GFR calc non Af Amer: >90 mL/min (ref >90)
Glucose, Bld: 111 mg/dL — ABNORMAL HIGH (ref 70–99)
Potassium: 3.6 mEq/L (ref 3.5–5.1)
Sodium: 145 mEq/L (ref 135–145)

## 2013-01-27 LAB — HEPATIC FUNCTION PANEL
Bilirubin, Direct: 0.1 mg/dL (ref 0.0–0.3)
Indirect Bilirubin: 0.3 mg/dL (ref 0.3–0.9)
Total Bilirubin: 0.4 mg/dL (ref 0.3–1.2)

## 2013-01-27 MED ORDER — ALBUTEROL SULFATE (5 MG/ML) 0.5% IN NEBU
2.5000 mg | INHALATION_SOLUTION | RESPIRATORY_TRACT | Status: DC
  Administered 2013-01-27 – 2013-01-30 (×18): 2.5 mg via RESPIRATORY_TRACT
  Filled 2013-01-27 (×18): qty 0.5

## 2013-01-27 MED ORDER — IPRATROPIUM BROMIDE 0.02 % IN SOLN
0.5000 mg | Freq: Four times a day (QID) | RESPIRATORY_TRACT | Status: DC
  Administered 2013-01-27 – 2013-01-30 (×12): 0.5 mg via RESPIRATORY_TRACT
  Filled 2013-01-27 (×13): qty 2.5

## 2013-01-27 MED ORDER — THIAMINE HCL 100 MG/ML IJ SOLN
100.0000 mg | Freq: Every day | INTRAMUSCULAR | Status: DC
  Administered 2013-01-27 – 2013-01-28 (×2): 100 mg via INTRAVENOUS
  Filled 2013-01-27 (×3): qty 1

## 2013-01-27 MED ORDER — FOLIC ACID 5 MG/ML IJ SOLN
1.0000 mg | Freq: Every day | INTRAMUSCULAR | Status: DC
  Administered 2013-01-27 – 2013-01-28 (×2): 1 mg via INTRAVENOUS
  Filled 2013-01-27 (×3): qty 0.2

## 2013-01-27 NOTE — Progress Notes (Signed)
Patient: [name] / Admit Date: 01/16/2013 / Date of Encounter: 01/27/2013, 6:48 AM   Subjective  Extubated yesterday AM to [place], then developed resp distress requiring bipap - currently comfortable on bipap. Denies CP or SOB.   Objective   Telemetry: NSR/ST. Some leads show QRS of varying amplitude.  Physical Exam: Filed Vitals:   01/27/13 0600  BP: 136/92  Pulse: 103  Temp: 98.2  Resp: 22, 100% bipap   General: Thin AAF in no acute distress. Head: Normocephalic, atraumatic, sclera non-icteric, no xanthomas, nares are without discharge. Neck: JVD not elevated. Lungs: Coarse but fairly good air movement, on bipap. Breathing is unlabored. Heart: Reg rhythm, borderline tachycardic, S1 S2 with 2/6 SEM at the apex. No rubs or gallops.  Abdomen: Soft, non-tender, non-distended with normoactive bowel sounds. No rebound/guarding. No obvious abdominal masses. Msk:  Strength and tone appear normal for age. Extremities: No clubbing or cyanosis. No edema.  Distal pedal pulses are 2+ and equal bilaterally. Neuro: Difficult to fully assess with questions while on bipap but alert, awake, follows commands and moves all extremities spontaneously.    Intake/Output Summary (Last 24 hours) at 01/27/13 0648 Last data filed at 01/27/13 0600  Gross per 24 hour  Intake 985.67 ml  Output   1401 ml  Net -415.33 ml    Inpatient Medications:  . antiseptic oral rinse  15 mL Mouth Rinse QID  . carvedilol  3.125 mg Oral BID WC  . chlorhexidine  15 mL Mouth Rinse BID  . feeding supplement  30 mL Per Tube Q1500  . furosemide  40 mg Intravenous Daily  . heparin  5,000 Units Subcutaneous Q8H  . insulin aspart  0-9 Units Subcutaneous Q4H  . pantoprazole (PROTONIX) IV  40 mg Intravenous Q24H    Labs:  Recent Labs  01/26/13 0430 01/27/13 0400  NA 142 145  K 4.0 3.6  CL 102 105  CO2 22 25  GLUCOSE 118* 111*  BUN 34* 34*  CREATININE 0.76 0.71  CALCIUM 9.9 10.0  MG 3.0* 3.0*  PHOS 5.4*  5.7*   No results found for this basename: AST, ALT, ALKPHOS, BILITOT, PROT, ALBUMIN,  in the last 72 hours  Recent Labs  01/26/13 0430 01/27/13 0400  WBC 20.9* 33.0*  NEUTROABS 16.6* 26.7*  HGB 9.7* 10.1*  HCT 28.9* 30.2*  MCV 88.1 88.6  PLT 538* 735*   Radiology/Studies:  Dg Chest Port 1 View 01/26/2013  *RADIOLOGY REPORT*  Clinical Data: Intubated, respiratory failure, substance abuse  PORTABLE CHEST - 1 VIEW  Comparison: 01/23/2013  Findings: Low endotracheal tube approximate 6 mm above the carina as before.  This could be retracted 2 cm.  Normal heart size and vascularity.  The left subclavian central line tip in the SVC. Minimal left base atelectasis otherwise clear lungs.  No edema, collapse, consolidation, effusion or pneumothorax.  Atherosclerosis of the aorta.  NG tube coiled the stomach.  IMPRESSION: Minimal left base atelectasis.  Overall improving aeration.  Low endotracheal tube just above the carina could be retracted 2 cm.   Original Report Authenticated By: Judie Petit. Miles Costain, M.D.    Dg Chest Port 1 View 01/23/2013  *RADIOLOGY REPORT*  Clinical Data: Respiratory difficulty  PORTABLE CHEST - 1 VIEW  Comparison: Yesterday  Findings: Endotracheal tube tip remains 6 mm above the carina and should be retracted.  NG tube, left subclavian center venous catheter are stable.  Bibasilar atelectasis increased.  Upper normal heart size.  Upper lungs clear.  No pneumothorax  IMPRESSION: Increased bibasilar atelectasis.  Endotracheal tube tip just above the carina.   Original Report Authenticated By: Jolaine Click, M.D.     Assessment and Plan  1. Cardiac arrest - cath without CAD. Will need to consider ICD prior to discharge. 2. NICM/acute systolic CHF - EF 25%. UDS neg but history of EtOH - question alcohol cardiomyopathy versus other etiology, with plan for cardiac MRI being considered. Cont Coreg. Diuresis slowing down so may be nearing point to change from IV lasix. Consider initiation of low dose  ACEI. Will ask MD to review telemetry as some leads show QRS with varying amplitude. 3. Acute respiratory failure - failed extubation 3/27 due to agitation but was able to be extubated yesterday, currently tolerating bipap. 4. Heme - WBC/platelets continue to climb with smear showing atypical lymphocytes and large platelets. Initial smear with absolute lymphocytosis. Unclear connection to the above, ?infectious (ie mono) vs hematologic malignancy 5. EtOH abuse - withdrawal protocol to be determined by primary team. 6. H/o cocaine abuse - UDS neg for cocaine on admission. Needs to abtain from this forever given need for BB. 7. Mitral regurgitation - mod by echo, Dr. Shirlee Latch considering repeat limited echo by prior notes. 8. Hypermagnesemia - last parenteral source was 3/27 and with normal Cr it is unclear why this continues to be elevated. Appreciate PCCM input.  Signed, Ronie Spies PA-C  Patient seen with PA, agree with the above note.  CVP is 2 today.  Suspect hypoxemia/Bipap need may be due to aspiration pneumonitis/PNA.  Significant elevation in WBCs.  - Stop Lasix today - Can continue current Coreg dose.  When BP more stable, add ACEI/titrate Coreg.  [name] 01/27/2013 9:15 AM

## 2013-01-27 NOTE — Progress Notes (Signed)
PULMONARY  / CRITICAL CARE MEDICINE  Name: [name] MRN: [id number] DOB: 30-Nov-1960    ADMISSION DATE:  01/16/2013 CONSULTATION DATE:  01/16/2013  REFERRING MD :  EDP PRIMARY SERVICE:  PCCM  CHIEF COMPLAINT:  Cardiac arrest  BRIEF PATIENT DESCRIPTION: 52 yo with history of drug, EtOH abuse, cardiomyopathy, admitted after VF arrest, treated with hypothermia protocol.  SIGNIFICANT EVENTS / STUDIES:  3/19  VF arrest, hypothermia protocol  3/19  Cardiac cath >>> no significant CAD 3/20  2D echo >>> EF 20%, diastolic dysfunction, MR  LINES / TUBES: OETT 3/20 >>> 3/27 OETT 3/27 >> 3/30 L  CVL 3/20 >>>   CULTURES: 3/20  Respiratory >>> neg  ANTIBIOTICS: Unasyn 3/20 >>> 3/27  INTERVAL HISTORY:  Extubated 3/30, tolerated but required BiPAP o/n Poorly oriented, but no longer agitated  VITAL SIGNS: Temp:  [97.7 F (36.5 C)-98.6 F (37 C)] 98.6 F (37 C) (03/31 0800) Pulse Rate:  [100-115] 115 (03/31 0930) Resp:  [20-46] 21 (03/31 1100) BP: (100-163)/(56-101) 154/96 mmHg (03/31 1100) SpO2:  [91 %-100 %] 99 % (03/31 1100) FiO2 (%):  [30 %-100 %] 55 % (03/31 0818) Weight:  [50.5 kg (111 lb 5.3 oz)] 50.5 kg (111 lb 5.3 oz) (03/31 0500)  HEMODYNAMICS: CVP:  [2 mmHg-3 mmHg] 2 mmHg  VENTILATOR SETTINGS: Vent Mode:  [-]  FiO2 (%):  [30 %-100 %] 55 %  INTAKE / OUTPUT: Intake/Output     03/30 0701 - 03/31 0700 03/31 0701 - 04/01 0700   P.O. 50    I.V. (mL/kg) 795.7 (15.8) 80 (1.6)   Other     NG/GT 90    Total Intake(mL/kg) 935.7 (18.5) 80 (1.6)   Urine (mL/kg/hr) 1400 (1.2) 185 (0.8)   Emesis/NG output     Stool 1 (0)    Total Output 1401 185   Net -465.3 -105          PHYSICAL EXAMINATION: General:  Appears comfortable on BiPAP, synchronous Neuro:  wide awake, able to talk, non-focal, confused HEENT:  PERRL, OP dry Cardiovascular:  RRR, no m/r/g Lungs:  Bilateral diminished air entry, systolic murmur, no wheeze Abdomen:  Soft, nontender, bowel sounds  diminished Musculoskeletal:  Moves all extremities, no edema Skin:  Intact  LABS:  Recent Labs Lab 01/21/13 0410  01/22/13 1730  01/23/13 1428 01/23/13 1430  01/25/13 0440 01/26/13 0430 01/26/13 1934 01/27/13 0400  HGB 9.9*  < >  --   < >  --   --   < > 8.7* 9.7*  --  10.1*  WBC 10.0  < >  --   < >  --   --   < > 11.7* 20.9*  --  33.0*  PLT 181  < >  --   < >  --   --   < > 363 538*  --  735*  NA 141  < >  --   < >  --   --   < > 140 142  --  145  K 4.0  < >  --   < >  --   --   < > 3.8 4.0  --  3.6  CL 100  < >  --   < >  --   --   < > 104 102  --  105  CO2 29  < >  --   < >  --   --   < > 25 22  --  25  GLUCOSE 163*  < >  --   < >  --   --   < > 140* 118*  --  111*  BUN 10  < >  --   < >  --   --   < > 26* 34*  --  34*  CREATININE 0.73  < >  --   < >  --   --   < > 0.64 0.76  --  0.71  CALCIUM 9.3  < >  --   < >  --   --   < > 9.4 9.9  --  10.0  MG 1.9  < >  --   < >  --   --   < > 2.7* 3.0*  --  3.0*  PHOS 5.7*  < >  --   < >  --   --   < > 4.8* 5.4*  --  5.7*  AST  --   --   --   --   --   --   --   --   --   --  38*  ALT  --   --   --   --   --   --   --   --   --   --  31  ALKPHOS  --   --   --   --   --   --   --   --   --   --  126*  BILITOT  --   --   --   --   --   --   --   --   --   --  0.4  PROT  --   --   --   --   --   --   --   --   --   --  8.8*  ALBUMIN  --   --   --   --   --   --   --   --   --   --  3.5  APTT  --   --  55*  --   --   --   --   --   --   --  40*  INR  --   --  1.03  --   --   --   --   --   --   --  1.18  PROBNP 1206.0*  --   --   --   --   --   --   --   --   --   --   PHART  --   --   --   --  7.299* 7.515*  --   --   --  7.482*  --   PCO2ART  --   --   --   --  55.5* 29.9*  --   --   --  32.4*  --   PO2ART  --   --   --   --  65.0* 258.0*  --   --   --  43.8*  --   < > = values in this interval not displayed.  Recent Labs Lab 01/26/13 1548 01/26/13 1919 01/27/13 0014 01/27/13 0318 01/27/13 0815  GLUCAP 110* 112* 108* 117* 97    Imaging: Dg Chest Port 1 View  01/27/2013  *RADIOLOGY REPORT*  Clinical Data: Respiratory distress.  PORTABLE CHEST - 1 VIEW  Comparison: 01/26/2013.  Findings: Trachea is midline.  Heart size normal.  Left subclavian central line tip projects over the SVC.  There is new right lower lobe air space disease.  Possible tiny right pleural effusion. Left lung is clear.  IMPRESSION: New right lower lobe collapse/consolidation and tiny right pleural effusion.   Original Report Authenticated By: Leanna Battles, M.D.    Dg Chest Port 1 View  01/26/2013  *RADIOLOGY REPORT*  Clinical Data: Intubated, respiratory failure, substance abuse  PORTABLE CHEST - 1 VIEW  Comparison: 01/23/2013  Findings: Low endotracheal tube approximate 6 mm above the carina as before.  This could be retracted 2 cm.  Normal heart size and vascularity.  The left subclavian central line tip in the SVC. Minimal left base atelectasis otherwise clear lungs.  No edema, collapse, consolidation, effusion or pneumothorax.  Atherosclerosis of the aorta.  NG tube coiled the stomach.  IMPRESSION: Minimal left base atelectasis.  Overall improving aeration.  Low endotracheal tube just above the carina could be retracted 2 cm.   Original Report Authenticated By: Judie Petit. Shick, M.D.       ASSESSMENT / PLAN:  PULMONARY A:  Acute respiratory failure in setting of cardiac arrest.  Minimal oxygen requirements.  Failed extubation 3/27 largely due to agitation. P:   Continue BiPAP prn, suspect we can wean to off 3/31 Will need to address code status, ie would we re-intubate if she decompensated Add scheduled BD's  CARDIOVASCULAR  Intake/Output Summary (Last 24 hours) at 01/27/13 1152 Last data filed at 01/27/13 1100  Gross per 24 hour  Intake 765.67 ml  Output   1585 ml  Net -819.33 ml    A: VF cardiac arrest. Nonischemic cardiomyopathy. Acute systolic / diastolic CHF.  No hemodynamically significant CAD. Suspect euvolemic at this point 3/31, CVP  2 P:  Cardiology following May need ICD / heart MRI to complete w/u Hold coreg, ACE-I, lasix at this point 3/31 given relative hypotension Dose any diuretics daily for now  RENAL  Recent Labs Lab 01/25/13 0440 01/26/13 0430 01/27/13 0400  K 3.8 4.0 3.6    A:  Hypokalemia, resolved P:   Trend BMP  GASTROINTESTINAL A:  Dysphagia P:   PPI for SUP Swallow eval ordered 3/31  HEMATOLOGIC  Recent Labs  01/26/13 0430 01/27/13 0400  HGB 9.7* 10.1*    A:  Anemia, stable. P:  Trend CBC Heparin for DVT Px  INFECTIOUS A:  Suspected aspiration, completed 7 days of abx. P:   D/c'd Unasyn 3/27  ENDOCRINE  CBG (last 3)   Recent Labs  01/27/13 0014 01/27/13 0318 01/27/13 0815  GLUCAP 108* 117* 97    A:  Hyperglycemia. P:   SSI  NEUROLOGIC A:  Encephalopathy. Withdrawal? awake and follows commands but confused.  P:   Avoid sedating meds Watch closely for EtOH withdrawal Add thiamine, folate  TODAY'S SUMMARY: 52 yo with h/o drug abuse s/p VF cardiac arrest / hypothermia.  Nonischemic cardiomyopathy (EF 20%). Cardiac cath negative.  Acute on chronic resp failure.   CC time 30 min.  Patient seen and examined, agree with above note.  I dictated the care and orders written for this patient under my direction.  Levy Pupa, MD, PhD 01/27/2013, 12:04 PM [facility] [phone] or if no answer [phone]

## 2013-01-27 NOTE — Progress Notes (Signed)
Rehab Admissions Coordinator Note:  Patient was screened by Trish Mage for appropriateness for an Inpatient Acute Rehab Consult.  At this time, we are recommending Inpatient Rehab consult.  Trish Mage 01/27/2013, 12:26 PM  I can be reached at [phone].

## 2013-01-27 NOTE — Evaluation (Addendum)
Clinical/Bedside Swallow Evaluation Patient Details  Name: [name] MRN: [id number] Date of Birth: 08/23/1961  Today's Date: 01/27/2013 Time: 4098-1191 SLP Time Calculation (min): 20 min  Past Medical History:  Past Medical History  Diagnosis Date  . Crack cocaine use   . ETOH abuse   . Anxiety   . Depression   . Pulmonary embolism     a. 04/2009 - treated with coumadin x 8 mos.  . DVT (deep venous thrombosis)     a. 04/2009 - treated with coumadin x 8 mos.  . Rectal bleeding     a. int hemorrhoids by colonoscopy in 04/2006, Bx neg for IBD  . Duodenitis     a. 05/2006 2/2 H pylori +/- NSAIDS  . History of CVA (cerebrovascular accident)   . Tobacco abuse   . Chest pain     a. 04/2009 Echo: EF 55-60%, Gr1 DD, Mild MR.  Marland Kitchen Hypertension   . Heart murmur   . Stroke   . Seizures   . Anemia   . Vitamin B12 deficiency anemia    Past Surgical History: History reviewed. No pertinent past surgical history. HPI:  52 yo with history of drug, EtOH abuse, cardiomyopathy, admitted after VF arrest, treated with hypothermia protocol.Pt was intubated fro 3/20 to 3/27 and then reintubated on 3/27 until 3/30. She has required BiPAP up to a few hours ago today.    Assessment / Plan / Recommendation Clinical Impression  Pt presents with overt evidence of aspiration with thin and thick consistencies. Dysphagia appears more serious than a post-intubation dysphagia. Pt with wet, gurgling vocal quality, suspect poorly managed pharyngeal secretions, weak multiple swallows. Pt does demonstrate toelrance of small bites of puree without wet vocal quality or cough.   At this time, pt is not recommended to initiate a PO diet, aspiration risk is high with all textures though pt likely to tolerate pills in puree as needed. Pt respiratory status is also unstable; she may require more BiPAP treatment or reintubation. If pt is able to participate, recommend MBS in am. If not, consider short term nutrition via  small bore NG until pt can participate in MBS.     Aspiration Risk  Severe    Diet Recommendation NPO except meds   Medication Administration: Whole meds with puree Postural Changes and/or Swallow Maneuvers: Seated upright 90 degrees    Other  Recommendations Recommended Consults: MBS   Follow Up Recommendations   (TBD by MBS)    Frequency and Duration        Pertinent Vitals/Pain NA    SLP Swallow Goals     Swallow Study Prior Functional Status       General HPI: 52 yo with history of drug, EtOH abuse, cardiomyopathy, admitted after VF arrest, treated with hypothermia protocol.Pt was intubated fro 3/20 to 3/27 and then reintubated on 3/27 until 3/30. She has required BiPAP up to a few hours ago today.  Type of Study: Bedside swallow evaluation Diet Prior to this Study: NPO Temperature Spikes Noted: No Respiratory Status: Supplemental O2 delivered via (comment) History of Recent Intubation: Yes Length of Intubations (days): 11 days Date extubated: 01/26/13 Behavior/Cognition: Alert;Cooperative Oral Cavity - Dentition: Adequate natural dentition Self-Feeding Abilities: Able to feed self Patient Positioning: Upright in bed Baseline Vocal Quality: Wet;Breathy;Hoarse;Low vocal intensity Volitional Cough: Weak;Wet;Congested Volitional Swallow: Able to elicit    Oral/Motor/Sensory Function Overall Oral Motor/Sensory Function: Appears within functional limits for tasks assessed   Ice Chips Ice chips: Impaired Presentation:  Spoon Pharyngeal Phase Impairments: Suspected delayed Swallow;Decreased hyoid-laryngeal movement;Wet Vocal Quality   Thin Liquid Thin Liquid: Impaired Presentation: Cup Pharyngeal  Phase Impairments: Suspected delayed Swallow;Decreased hyoid-laryngeal movement;Multiple swallows;Wet Vocal Quality;Cough - Immediate    Nectar Thick Nectar Thick Liquid: Impaired Presentation: Cup Pharyngeal Phase Impairments: Suspected delayed Swallow;Wet Vocal  Quality;Multiple swallows;Decreased hyoid-laryngeal movement;Cough - Immediate   Honey Thick Honey Thick Liquid: Not tested   Puree Puree: Impaired Presentation: Spoon Pharyngeal Phase Impairments: Decreased hyoid-laryngeal movement;Multiple swallows   Solid   GO    Solid: Not tested      Harlon Ditty, MA CCC-SLP [phone]  Claudine Mouton 01/27/2013,3:36 PM

## 2013-01-27 NOTE — Progress Notes (Signed)
SLP Cancellation Note  Patient Details Name: [name] MRN: [id number] DOB: 13-Sep-1961   Cancelled treatment:       Reason Eval/Treat Not Completed: Medical issues which prohibited therapy. Pt on BiPAP this am. RN unsure if pt will progress or decline to reintubation. SLP will recheck in pm for swallow eval readiness.    Rosanna Bickle, Riley Nearing 01/27/2013, 9:31 AM

## 2013-01-27 NOTE — Evaluation (Signed)
Physical Therapy Evaluation Patient Details Name: [name] MRN: [id number] DOB: 31-Aug-1961 Today's Date: 01/27/2013 Time: 9528-4132 PT Time Calculation (min): 20 min  PT Assessment / Plan / Recommendation Clinical Impression  52 yo with history of drug abuse admitted after VF arrest treated with hypothermia protocol. Extubated yesterday, currently requring bi-pap. Vitals remained stable throughout eval today. Per her daughter PTA this patient was independent with all ADLs and mobility. Today she presents to PT with decreased activity tolerance, balance and generalized weakness impacting her functional independence. Will benefit physical therapy in the acute setting to maximize functional independence for safe d/c plan.     PT Assessment  Patient needs continued PT services    Follow Up Recommendations  CIR;Supervision/Assistance - 24 hour    Does the patient have the potential to tolerate intense rehabilitation      Barriers to Discharge        Equipment Recommendations   (tbd)    Recommendations for Other Services     Frequency Min 3X/week    Precautions / Restrictions Precautions Precautions: Fall Restrictions Weight Bearing Restrictions: No   Pertinent Vitals/Pain Denies pain, vitals stable, pt on bi-pap during our session today      Mobility  Bed Mobility Bed Mobility: Supine to Sit Supine to Sit: 4: Min guard;HOB elevated (30 degrees) Details for Bed Mobility Assistance: assist to maneuver lines and leads, cues for squencing and gaurding for safety Transfers Transfers: Sit to Stand;Stand to Sit Sit to Stand: 4: Min assist;With upper extremity assist;From bed Stand to Sit: 4: Min assist;With upper extremity assist;To bed Details for Transfer Assistance: min stability assist throughout, pt a bit impuslive needing cues for safety Ambulation/Gait Ambulation/Gait Assistance: 4: Min assist Ambulation Distance (Feet): 8 Feet (4 ft forward and 4 ft  backward) Assistive device: 1 person hand held assist Ambulation/Gait Assistance Details: ambulated with RUE HHA and min stability assist around her trunk, cues for safety  Gait Pattern: Step-through pattern;Narrow base of support Gait velocity: decreased General Gait Details: generally unsteady with increased lateral sway    Exercises     PT Diagnosis: Difficulty walking;Abnormality of gait;Generalized weakness  PT Problem List: Decreased activity tolerance;Decreased balance;Decreased mobility;Decreased strength;Cardiopulmonary status limiting activity;Decreased cognition PT Treatment Interventions: DME instruction;Gait training;Functional mobility training;Therapeutic activities;Therapeutic exercise;Balance training;Neuromuscular re-education;Patient/family education;Cognitive remediation;Stair training   PT Goals Acute Rehab PT Goals PT Goal Formulation: With patient Time For Goal Achievement: 02/03/13 Potential to Achieve Goals: Good Pt will go Supine/Side to Sit: Independently PT Goal: Supine/Side to Sit - Progress: Goal set today Pt will go Sit to Supine/Side: Independently PT Goal: Sit to Supine/Side - Progress: Goal set today Pt will go Sit to Stand: Independently PT Goal: Sit to Stand - Progress: Goal set today Pt will go Stand to Sit: Independently PT Goal: Stand to Sit - Progress: Goal set today Pt will Transfer Bed to Chair/Chair to Bed: Independently Pt will Stand: Independently;6 - 10 min;with no upper extremity support PT Goal: Stand - Progress: Goal set today Pt will Ambulate: >150 feet;Independently PT Goal: Ambulate - Progress: Goal set today Pt will Go Up / Down Stairs: 3-5 stairs;with min assist PT Goal: Up/Down Stairs - Progress: Goal set today Pt will Perform Home Exercise Program: Independently PT Goal: Perform Home Exercise Program - Progress: Goal set today  Visit Information  Last PT Received On: 01/27/13 Assistance Needed: +1 (+2 helpful for lines)     Subjective Data  Subjective: Patient nodding appropriately throughout session but had bi-pap on. Denied  pain. Patient Stated Goal: to go home (per daughter)   Prior Functioning  Home Living Lives With: Daughter;Significant other Available Help at Discharge: Family;Available 24 hours/day Type of Home: House Home Access: Stairs to enter [employer] of Steps: 3 Entrance Stairs-Rails: None Home Layout: One level Firefighter: Standard Home Adaptive Equipment: None Prior Function Level of Independence: Independent Vocation: Retired Musician:  (bi-pap)    Cognition  Cognition Overall Cognitive Status: Difficult to assess Area of Impairment: Safety/judgement Difficult to assess due to: Other (comment) (bi-pap) Arousal/Alertness: Awake/alert Orientation Level:  (per RN pt thought she was at home this morning) Behavior During Session: [facility] for tasks performed Safety/Judgement: Impulsive    Extremity/Trunk Assessment Right Upper Extremity Assessment RUE ROM/Strength/Tone: [facility] for tasks assessed Left Upper Extremity Assessment LUE ROM/Strength/Tone: WFL for tasks assessed Right Lower Extremity Assessment RLE ROM/Strength/Tone: Deficits RLE ROM/Strength/Tone Deficits: grossly 4/5 Left Lower Extremity Assessment LLE ROM/Strength/Tone: Deficits LLE ROM/Strength/Tone Deficits: grossly 4/5 Trunk Assessment Trunk Assessment: Normal   Balance Balance Balance Assessed: Yes Dynamic Sitting Balance Dynamic Sitting - Balance Support: No upper extremity supported Dynamic Sitting - Level of Assistance: 5: Stand by assistance Dynamic Sitting - Comments: pt able to take on and off her socks sitting EOB needing gaurding for safety, tends to fall posteriorly but able to correct with cueing Static Standing Balance Static Standing - Balance Support: Right upper extremity supported Static Standing - Level of Assistance: 4: Min assist  End of Session PT - End of  Session Equipment Utilized During Treatment: Gait belt;Oxygen (pt on bi-pap throughout sessio) Activity Tolerance: Patient tolerated treatment well Patient left: in bed;with call bell/phone within reach Nurse Communication: Mobility status  GP     [facility] HELEN 01/27/2013, 10:05 AM

## 2013-01-28 ENCOUNTER — Inpatient Hospital Stay (HOSPITAL_COMMUNITY): Payer: 59

## 2013-01-28 LAB — CBC WITH DIFFERENTIAL/PLATELET
Basophils Absolute: 0 10*3/uL (ref 0.0–0.1)
Basophils Relative: 0 % (ref 0–1)
Eosinophils Absolute: 0.2 10*3/uL (ref 0.0–0.7)
HCT: 30.1 % — ABNORMAL LOW (ref 36.0–46.0)
Hemoglobin: 9.9 g/dL — ABNORMAL LOW (ref 12.0–15.0)
Lymphocytes Relative: 24 % (ref 12–46)
MCHC: 32.9 g/dL (ref 30.0–36.0)
Monocytes Relative: 7 % (ref 3–12)
Neutrophils Relative %: 68 % (ref 43–77)
RDW: 15.1 % (ref 11.5–15.5)
WBC: 17.4 10*3/uL — ABNORMAL HIGH (ref 4.0–10.5)

## 2013-01-28 LAB — BASIC METABOLIC PANEL
Calcium: 10.1 mg/dL (ref 8.4–10.5)
GFR calc non Af Amer: >90 mL/min (ref >90)
Potassium: 3.3 mEq/L — ABNORMAL LOW (ref 3.5–5.1)
Sodium: 150 mEq/L — ABNORMAL HIGH (ref 135–145)

## 2013-01-28 LAB — GLUCOSE, CAPILLARY
Glucose-Capillary: 89 mg/dL (ref 70–99)
Glucose-Capillary: 90 mg/dL (ref 70–99)

## 2013-01-28 LAB — MAGNESIUM: Magnesium: 3 mg/dL — ABNORMAL HIGH (ref 1.5–2.5)

## 2013-01-28 LAB — PHOSPHORUS: Phosphorus: 5.1 mg/dL — ABNORMAL HIGH (ref 2.3–4.6)

## 2013-01-28 MED ORDER — ENSURE COMPLETE PO LIQD
237.0000 mL | Freq: Two times a day (BID) | ORAL | Status: DC
  Administered 2013-01-28 – 2013-01-30 (×3): 237 mL via ORAL

## 2013-01-28 MED ORDER — POTASSIUM CHLORIDE CRYS ER 20 MEQ PO TBCR
EXTENDED_RELEASE_TABLET | ORAL | Status: AC
  Filled 2013-01-28: qty 2

## 2013-01-28 MED ORDER — CARVEDILOL 6.25 MG PO TABS
6.2500 mg | ORAL_TABLET | Freq: Two times a day (BID) | ORAL | Status: DC
  Administered 2013-01-28 – 2013-01-31 (×6): 6.25 mg via ORAL
  Filled 2013-01-28 (×8): qty 1

## 2013-01-28 MED ORDER — STARCH (THICKENING) PO POWD
ORAL | Status: DC | PRN
  Filled 2013-01-28: qty 227

## 2013-01-28 MED ORDER — POTASSIUM CHLORIDE CRYS ER 20 MEQ PO TBCR
40.0000 meq | EXTENDED_RELEASE_TABLET | Freq: Once | ORAL | Status: AC
  Administered 2013-01-28: 40 meq via ORAL

## 2013-01-28 NOTE — Progress Notes (Addendum)
[name], female   DOB: [birth year], 52 y.o.   MRN: [id number]    SUBJECTIVE: Awake/alert, BP improved.  No complaints this morning.  Sodium is up.    Marland Kitchen ipratropium  0.5 mg Nebulization Q6H   And  . albuterol  2.5 mg Nebulization Q4H  . antiseptic oral rinse  15 mL Mouth Rinse QID  . carvedilol  3.125 mg Oral BID WC  . chlorhexidine  15 mL Mouth Rinse BID  . folic acid  1 mg Intravenous Daily  . heparin  5,000 Units Subcutaneous Q8H  . insulin aspart  0-9 Units Subcutaneous Q4H  . pantoprazole (PROTONIX) IV  40 mg Intravenous Q24H  . thiamine  100 mg Intravenous Daily    Filed Vitals:   01/28/13 0400 01/28/13 0600 01/28/13 0738 01/28/13 0809  BP: 140/82 142/74  146/84  Pulse:    94  Temp: 98.3 F (36.8 C)  97.5 F (36.4 C)   TempSrc: Oral  Oral   Resp: 27 24    Height:      Weight:      SpO2: 99% 100%      Intake/Output Summary (Last 24 hours) at 01/28/13 4098 Last data filed at 01/27/13 2200  Gross per 24 hour  Intake    280 ml  Output    320 ml  Net    -40 ml    LABS: Basic Metabolic Panel:  Recent Labs  11/91/47 0400 01/28/13 0442  NA 145 150*  K 3.6 3.3*  CL 105 111  CO2 25 24  GLUCOSE 111* 96  BUN 34* 36*  CREATININE 0.71 0.58  CALCIUM 10.0 10.1  MG 3.0* 3.0*  PHOS 5.7* 5.1*   Liver Function Tests:  Recent Labs  01/27/13 0400  AST 38*  ALT 31  ALKPHOS 126*  BILITOT 0.4  PROT 8.8*  ALBUMIN 3.5   No results found for this basename: LIPASE, AMYLASE,  in the last 72 hours CBC:  Recent Labs  01/27/13 0400 01/28/13 0442  WBC 33.0* 17.4*  NEUTROABS 26.7* 11.8*  HGB 10.1* 9.9*  HCT 30.2* 30.1*  MCV 88.6 90.1  PLT 735* 888*   PHYSICAL EXAM General: awake/alert, NAD Neck: No JVD, no thyromegaly or thyroid nodule.  Lungs: Dependent crackles CV: Nondisplaced PMI.  Heart regular S1/S2, no S3/S4, 2/6 HSM apex.  No peripheral edema.  Abdomen: Soft, no hepatosplenomegaly, no distention.  Extremities: No clubbing or  cyanosis.   TELEMETRY: Reviewed telemetry pt in NSR  ASSESSMENT AND PLAN: 52 yo with history of substance abuse had out of hospital arrest with defibrillation in the field.  Cath without significant CAD.   1. CHF: Acute on chronic systolic CHF, EF 82% (nonischemic cardiomyopathy). Urine drug screen negative. She is a heavy drinker and may have an ETOH cardiomyopathy. BP better today. - Does not appear volume overloaded, no lasix at this time.  - Increase Coreg to 6.25 mg bid.   - Cardiac MRI perhaps tomorrow to assess for evidence of infiltrative disease/myocarditis.  - Will need initiation of ACEI.  BUN is high currently, will plan to start tomorrow as long as this is not worse.  2. Mitral regurgitation: Prominent murmur. Read as moderate on initial echo. May be functional. Repeat limited echo when optimized prior to discharge to reassess MR.  3. Cardiac arrest: Will need evaluation for ICD prior to discharge.   4. Pulmonary: Possible aspiration pneumonitis, improved.   [name] 01/28/2013 8:22 AM

## 2013-01-28 NOTE — Progress Notes (Signed)
NUTRITION FOLLOW UP  Intervention:   1. Ensure Complete po BID, each supplement provides 350 kcal and 13 grams of protein. RN to thicken to appropriate consistency.  2. Encourage pt to order meals/items she will best tolerate 3. RD will continue to follow    Nutrition Dx:   Inadequate oral intake now related to dislike of D2 diet as evidenced by refusing meal  Goal:   PO intake to meet >/=90% estimated nutrition needs. Unmet   Monitor:   PO intake, diet tolerance, weight trends, labs   Assessment:   Pt was extubated and seen by SLP for diet. Upgraded to D2-honey thick.  RD was paged by RN re: pt not happy with meal tray. Pt had received D2 chicken and dumplings and stated it was too salty. This item is on the heart healthy diet and low in sodium. RD offered to order pt another item for lunch, pt refused. Pt was agreeable to drink Ensure (RN thickened). Pt was encouraged to order foods with the help of the ambassador for better food tolerance.   At the time of RD visit pt was also complaining about "having to move" and concerns about her living situation. Pt was difficult to understand and did not want to elaborate about the situation at this time.   Height: Ht Readings from Last 1 Encounters:  01/16/13 5\' 4"  (1.626 m)    Weight Status:   Wt Readings from Last 1 Encounters:  01/27/13 111 lb 5.3 oz (50.5 kg)    Re-estimated needs:  Kcal: 1400-1600 Protein: 50-60 gm  Fluid: 1.4-1.6 L   Skin: intact   Diet Order: Dysphagia   Intake/Output Summary (Last 24 hours) at 01/28/13 1519 Last data filed at 01/28/13 0930  Gross per 24 hour  Intake    140 ml  Output    400 ml  Net   -260 ml    Last BM: 3/31    Labs:   Recent Labs Lab 01/26/13 0430 01/27/13 0400 01/28/13 0442  NA 142 145 150*  K 4.0 3.6 3.3*  CL 102 105 111  CO2 22 25 24   BUN 34* 34* 36*  CREATININE 0.76 0.71 0.58  CALCIUM 9.9 10.0 10.1  MG 3.0* 3.0* 3.0*  PHOS 5.4* 5.7* 5.1*  GLUCOSE 118* 111*  96    CBG (last 3)   Recent Labs  01/28/13 0420 01/28/13 0736 01/28/13 1159  GLUCAP 89 90 101*    Scheduled Meds: . ipratropium  0.5 mg Nebulization Q6H   And  . albuterol  2.5 mg Nebulization Q4H  . antiseptic oral rinse  15 mL Mouth Rinse QID  . carvedilol  6.25 mg Oral BID WC  . folic acid  1 mg Intravenous Daily  . heparin  5,000 Units Subcutaneous Q8H  . pantoprazole (PROTONIX) IV  40 mg Intravenous Q24H  . thiamine  100 mg Intravenous Daily    Continuous Infusions: . sodium chloride Stopped (01/26/13 2247)    Clarene Duke RD, LDN Pager [phone] After Hours pager [phone]

## 2013-01-28 NOTE — Progress Notes (Signed)
Physical Therapy Treatment Patient Details Name: [name] MRN: [id number] DOB: [birth year] Today's Date: 01/28/2013 Time: 7846-9629 PT Time Calculation (min): 22 min  PT Assessment / Plan / Recommendation Comments on Treatment Session  52 yo with history of drug abuse admitted after VF arrest treated with hypothermia protocol. Progressing nicely today. Ambulating further. Still demonstrating gross weakness, balance impairments and decreased activity tolerance. Still feel she would benefit from some sort of post-acute rehab so as to reach modI level prior to d/c home with daughter. Patient reports her daughter will likely not be available 24 hours. If CIR not an option I would recommend ST-SNF as a backup plan.     Follow Up Recommendations  CIR;Supervision/Assistance - 24 hour      Does the patient have the potential to tolerate intense rehabilitation     Barriers to Discharge        Equipment Recommendations  None recommended by PT    Recommendations for Other Services    Frequency Min 3X/week   Plan Discharge plan remains appropriate;Frequency remains appropriate    Precautions / Restrictions Precautions Precautions: Fall Restrictions Weight Bearing Restrictions: No   Pertinent Vitals/Pain HR up to 116 with ambulation    Mobility  Bed Mobility Supine to Sit: 5: Supervision;HOB flat Transfers Transfers: Sit to Stand;Stand to Sit Sit to Stand: 4: Min guard;From bed;From chair/3-in-1;With upper extremity assist Stand to Sit: 4: Min guard;To chair/3-in-1;To bed;With upper extremity assist Details for Transfer Assistance: cues for safety, gaurding for stability, definitely needs upper extremities due to weak lower extremities Ambulation/Gait Ambulation/Gait Assistance: 4: Min assist Ambulation Distance (Feet): 60 Feet Assistive device: None Ambulation/Gait Assistance Details: Did not need HHA today however demonstrates increased lateral sway especially with distraction,  several LOB needing minA to correct Gait Pattern: Narrow base of support;Step-through pattern;Decreased stride length      PT Goals Acute Rehab PT Goals PT Goal: Supine/Side to Sit - Progress: Progressing toward goal PT Goal: Sit to Stand - Progress: Progressing toward goal PT Goal: Stand to Sit - Progress: Progressing toward goal PT Transfer Goal: Bed to Chair/Chair to Bed - Progress: Progressing toward goal PT Goal: Stand - Progress: Progressing toward goal PT Goal: Ambulate - Progress: Progressing toward goal  Visit Information  Last PT Received On: 01/28/13 Assistance Needed: +1    Subjective Data  Subjective: I know my daughter won't be there all the time.  Patient Stated Goal: to get stronger   Cognition  Cognition Overall Cognitive Status: Appears within functional limits for tasks assessed/performed Arousal/Alertness: Awake/alert Orientation Level: Disoriented to;Situation;Time;Place (thought she was at [facility] rehab) Behavior During Session: Flat affect    Balance  Static Standing Balance Static Standing - Balance Support: Right upper extremity supported Static Standing - Level of Assistance: 5: Stand by assistance Static Standing - Comment/# of Minutes: stood 4 minutes performing lateral and A/P weight shifts  End of Session PT - End of Session Equipment Utilized During Treatment: Gait belt Activity Tolerance: Patient tolerated treatment well Patient left: in chair Nurse Communication: Mobility status   GP     [facility] HELEN 01/28/2013, 12:55 PM

## 2013-01-28 NOTE — Progress Notes (Signed)
PULMONARY  / CRITICAL CARE MEDICINE  Name: [name] MRN: [id number] DOB: 1961-01-26    ADMISSION DATE:  01/16/2013 CONSULTATION DATE:  01/16/2013  REFERRING MD :  EDP PRIMARY SERVICE:  PCCM  CHIEF COMPLAINT:  Cardiac arrest  BRIEF PATIENT DESCRIPTION: 52 yo with history of drug, EtOH abuse, cardiomyopathy, admitted after VF arrest, treated with hypothermia protocol.  SIGNIFICANT EVENTS / STUDIES:  3/19  VF arrest, hypothermia protocol  3/19  Cardiac cath >>> no significant CAD 3/20  2D echo >>> EF 20%, diastolic dysfunction, MR  LINES / TUBES: OETT 3/20 >>> 3/27 OETT 3/27 >> 3/30 L [place] CVL 3/20 >>>   CULTURES: 3/20  Respiratory >>> neg  ANTIBIOTICS: Unasyn 3/20 >>> 3/27  INTERVAL HISTORY:  Much more awake and better oriented Still hasn't fully passed swallow eval  VITAL SIGNS: Temp:  [97.5 F (36.4 C)-99.1 F (37.3 C)] 97.5 F (36.4 C) (04/01 0738) Pulse Rate:  [88-108] 88 (04/01 0810) Resp:  [16-33] 23 (04/01 1133) BP: (111-162)/(68-91) 144/78 mmHg (04/01 1133) SpO2:  [75 %-100 %] 95 % (04/01 1133)  HEMODYNAMICS:    VENTILATOR SETTINGS:    INTAKE / OUTPUT: Intake/Output     03/31 0701 - 04/01 0700 04/01 0701 - 04/02 0700   P.O.     I.V. (mL/kg) 300 (5.9)    NG/GT     Total Intake(mL/kg) 300 (5.9)    Urine (mL/kg/hr) 360 (0.3) 300 (1.2)   Stool     Total Output 360 300   Net -60 -300        Urine Occurrence 1 x      PHYSICAL EXAMINATION: General:  Appears comfortable on RA Neuro:  wide awake, able to talk, non-focal, less confused HEENT:  PERRL, OP dry Cardiovascular:  RRR, no m/r/g Lungs:  Bilateral diminished air entry, systolic murmur, no wheeze Abdomen:  Soft, nontender, bowel sounds diminished Musculoskeletal:  Moves all extremities, no edema Skin:  Intact  LABS:  Recent Labs Lab 01/22/13 1730  01/23/13 1428 01/23/13 1430  01/26/13 0430 01/26/13 1934 01/27/13 0400 01/28/13 0442  HGB  --   < >  --   --   < > 9.7*  --   10.1* 9.9*  WBC  --   < >  --   --   < > 20.9*  --  33.0* 17.4*  PLT  --   < >  --   --   < > 538*  --  735* 888*  NA  --   < >  --   --   < > 142  --  145 150*  K  --   < >  --   --   < > 4.0  --  3.6 3.3*  CL  --   < >  --   --   < > 102  --  105 111  CO2  --   < >  --   --   < > 22  --  25 24  GLUCOSE  --   < >  --   --   < > 118*  --  111* 96  BUN  --   < >  --   --   < > 34*  --  34* 36*  CREATININE  --   < >  --   --   < > 0.76  --  0.71 0.58  CALCIUM  --   < >  --   --   < >  9.9  --  10.0 10.1  MG  --   < >  --   --   < > 3.0*  --  3.0* 3.0*  PHOS  --   < >  --   --   < > 5.4*  --  5.7* 5.1*  AST  --   --   --   --   --   --   --  38*  --   ALT  --   --   --   --   --   --   --  31  --   ALKPHOS  --   --   --   --   --   --   --  126*  --   BILITOT  --   --   --   --   --   --   --  0.4  --   PROT  --   --   --   --   --   --   --  8.8*  --   ALBUMIN  --   --   --   --   --   --   --  3.5  --   APTT 55*  --   --   --   --   --   --  40*  --   INR 1.03  --   --   --   --   --   --  1.18  --   PHART  --   --  7.299* 7.515*  --   --  7.482*  --   --   PCO2ART  --   --  55.5* 29.9*  --   --  32.4*  --   --   PO2ART  --   --  65.0* 258.0*  --   --  43.8*  --   --   < > = values in this interval not displayed.  Recent Labs Lab 01/27/13 1550 01/27/13 2001 01/28/13 0007 01/28/13 0420 01/28/13 0736  GLUCAP 111* 100* 102* 89 90   Imaging: Dg Chest Port 1 View  01/27/2013  *RADIOLOGY REPORT*  Clinical Data: Respiratory distress.  PORTABLE CHEST - 1 VIEW  Comparison: 01/26/2013.  Findings: Trachea is midline.  Heart size normal.  Left subclavian central line tip projects over the SVC.  There is new right lower lobe air space disease.  Possible tiny right pleural effusion. Left lung is clear.  IMPRESSION: New right lower lobe collapse/consolidation and tiny right pleural effusion.   Original Report Authenticated By: Leanna Battles, M.D.    Dg Swallowing Func-speech  Pathology  01/28/2013  Breck Coons [place], CCC-SLP     01/28/2013 10:35 AM Objective Swallowing Evaluation: Modified Barium Swallowing Study   Patient Details  Name: [name] MRN: [id number] Date of Birth: 1961-06-25  Today's Date: 01/28/2013 Time: 6962-9528 SLP Time Calculation (min): 20 min  Past Medical History:  Past Medical History  Diagnosis Date  . Crack cocaine use   . ETOH abuse   . Anxiety   . Depression   . Pulmonary embolism     a. 04/2009 - treated with coumadin x 8 mos.  . DVT (deep venous thrombosis)     a. 04/2009 - treated with coumadin x 8 mos.  . Rectal bleeding     a. int hemorrhoids by colonoscopy in 04/2006, Bx neg for IBD  . Duodenitis     a.  05/2006 2/2 H pylori +/- NSAIDS  . History of CVA (cerebrovascular accident)   . Tobacco abuse   . Chest pain     a. 04/2009 Echo: EF 55-60%, Gr1 DD, Mild MR.  Marland Kitchen Hypertension   . Heart murmur   . Stroke   . Seizures   . Anemia   . Vitamin B12 deficiency anemia    Past Surgical History: History reviewed. No pertinent past  surgical history. HPI:  52 yo with history of drug, EtOH abuse, cardiomyopathy, admitted  after VF arrest, treated with hypothermia protocol.Pt was  intubated fro 3/20 to 3/27 and then reintubated on 3/27 until  3/30. She has required BiPAP up to a few hours ago today.  BSE  yesterday with s/s aspiration and MBS recommended.     Assessment / Plan / Recommendation Clinical Impression  Dysphagia Diagnosis: Mild oral phase dysphagia;Moderate  pharyngeal phase dysphagia;Mild cervical esophageal phase  dysphagia Clinical impression: Pt. exhibited mild oral dysphagia with  decreased oral prep, mastication and transit with cracker and  delayed transit with pill (began to masticate pill).  Moderate  motor pharyngeal phase deficits include reduced laryngeal  elevation, anterior hyoid elevation resulting in incomplete  epiglottic deflection and frank silent aspiration with nectar  consistency.  Min verbal cue to cough cleared all aspirate from   trachea.  Pt. continued to aspirate despite implementing a chin  tuck posture with nectar barium.  Vallecular and pyriform sinus  residue mild-moderate which pt. sensed most of the time to  decrease amount to non e or minimal.  SLP recommends initiating a  Dys 2 diet and honey thick liquids, pills whole in applesauce.   ST will continue to follow.    Treatment Recommendation  Therapy as outlined in treatment plan below    Diet Recommendation Dysphagia 2 (Fine chop);Honey-thick liquid   Liquid Administration via: Cup;No straw Medication Administration: Whole meds with puree Supervision: Patient able to self feed;Full supervision/cueing  for compensatory strategies Compensations: Slow rate;Small sips/bites;Multiple dry swallows  after each bite/sip Postural Changes and/or Swallow Maneuvers: Seated upright 90  degrees    Other  Recommendations Oral Care Recommendations: Oral care BID   Follow Up Recommendations   (to be determined)    Frequency and Duration min 2x/week  2 weeks   Pertinent Vitals/Pain     SLP Swallow Goals Patient will utilize recommended strategies during swallow to  increase swallowing safety with: Minimal assistance      Reason for Referral Objectively evaluate swallowing function   Oral Phase Oral Preparation/Oral Phase Oral Phase: Impaired Oral - Solids Oral - Regular: Impaired mastication;Weak lingual  manipulation;Delayed oral transit Oral - Pill: Weak lingual manipulation;Delayed oral transit  (began masticating cracker)   Pharyngeal Phase Pharyngeal Phase Pharyngeal Phase: Impaired Pharyngeal - Pudding Pharyngeal - Pudding Teaspoon: Not tested Pharyngeal - Pudding Cup: Not tested Pharyngeal - Honey Pharyngeal - Honey Teaspoon: Reduced laryngeal elevation;Reduced  tongue base retraction;Pharyngeal residue - valleculae;Pharyngeal  residue - pyriform sinuses Pharyngeal - Honey Cup: Reduced laryngeal elevation;Reduced  tongue base retraction;Pharyngeal residue - valleculae;Pharyngeal  residue -  pyriform sinuses Pharyngeal - Honey Syringe: Not tested Pharyngeal - Nectar Pharyngeal - Nectar Teaspoon: Reduced laryngeal elevation;Reduced  tongue base retraction;Pharyngeal residue - valleculae;Pharyngeal  residue - pyriform sinuses Pharyngeal - Nectar Cup: Reduced laryngeal elevation;Reduced  tongue base retraction;Pharyngeal residue - valleculae;Pharyngeal  residue - pyriform sinuses;Penetration/Aspiration during  swallow;Reduced airway/laryngeal closure;Significant aspiration  (Amount);Reduced epiglottic inversion;Reduced anterior laryngeal  mobility Penetration/Aspiration details (nectar cup): Material enters  airway,  passes BELOW cords without attempt by patient to eject  out (silent aspiration);Material enters airway, CONTACTS cords  and not ejected out (cued coughed removed all aspirates!) Pharyngeal - Nectar Straw: Not tested Pharyngeal - Nectar Syringe: Not tested Pharyngeal - Thin Pharyngeal - Ice Chips: Not tested Pharyngeal - Thin Teaspoon: Not tested Pharyngeal - Thin Cup: Not tested Pharyngeal - Thin Straw: Not tested Pharyngeal - Thin Syringe: Not tested Pharyngeal - Solids Pharyngeal - Puree: Not tested Pharyngeal - Mechanical Soft: Not tested Pharyngeal - Regular: Pharyngeal residue - valleculae;Reduced  tongue base retraction Pharyngeal - Multi-consistency: Not tested Pharyngeal - Pill: Within functional limits  Cervical Esophageal Phase    GO    Cervical Esophageal Phase Cervical Esophageal Phase: Impaired Cervical Esophageal Phase - Comment Cervical Esophageal Comment:  (tight UES)         Darrow Bussing.Ed CCC-SLP Pager [phone]  01/28/2013       ASSESSMENT / PLAN:  PULMONARY A:  Acute respiratory failure in setting of cardiac arrest.  Minimal oxygen requirements.  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 Nonischemic cardiomyopathy. Acute systolic / diastolic CHF.  No hemodynamically significant CAD. Moderate Mitral regurg. Suspect euvolemic at this point  P:  Cardiology following Will need ICD / heart MRI to complete w/u Repeat TTE scheduled to better eval mitral valve Coreg, ACE-I to be dose by cardiology Dose any diuretics daily for now, lasix currently on hold (clinically dry, hypernatremia)  RENAL  Recent Labs Lab 01/26/13 0430 01/27/13 0400 01/28/13 0442  K 4.0 3.6 3.3*    A:  Hypokalemia P:   Replace K Trend BMP  GASTROINTESTINAL A:  Dysphagia P:   PPI for SUP Still needs thickened liquids. SLP following  HEMATOLOGIC  Recent Labs  01/27/13 0400 01/28/13 0442  HGB 10.1* 9.9*    A:  Anemia, stable. P:  Trend CBC Heparin for DVT Px  INFECTIOUS A:  Suspected aspiration, completed 7 days of abx. P:   D/c'd Unasyn 3/27  ENDOCRINE   Recent Labs  01/28/13 0007 01/28/13 0420 01/28/13 0736  GLUCAP 102* 89 90    A:  Hyperglycemia, resolved P:   D/c SSI on 4/1  NEUROLOGIC A:  Encephalopathy, much improved P:   Avoid sedating meds Watch closely for EtOH withdrawal Added thiamine, folate 3/31  TODAY'S SUMMARY: 52 yo with h/o drug abuse s/p VF cardiac arrest / hypothermia.  Nonischemic cardiomyopathy (EF 20%). Cardiac cath negative.  Acute on chronic resp failure. Improved. Will need cardiac MRI, probable AICD   Patient seen and examined, agree with above note.  I dictated the care and orders written for this patient under my direction.  Levy Pupa, MD, PhD 01/28/2013, 11:54 AM [facility] [phone] or if no answer [phone]

## 2013-01-28 NOTE — Progress Notes (Signed)
Pt refused to eat lunch tray; Harvin Hazel, RD came and spoke with pt; pt continues to refuse her diet; not happy with thickened diet; Pt became upset with RN and demanded that her daughter come up to hospital.  Called and notified daughter.

## 2013-01-28 NOTE — Procedures (Signed)
Objective Swallowing Evaluation: Modified Barium Swallowing Study  Patient Details  Name: [name] MRN: [id number] Date of Birth: Dec 19, 1960  Today's Date: 01/28/2013 Time: 5784-6962 SLP Time Calculation (min): 20 min  Past Medical History:  Past Medical History  Diagnosis Date  . Crack cocaine use   . ETOH abuse   . Anxiety   . Depression   . Pulmonary embolism     a. 04/2009 - treated with coumadin x 8 mos.  . DVT (deep venous thrombosis)     a. 04/2009 - treated with coumadin x 8 mos.  . Rectal bleeding     a. int hemorrhoids by colonoscopy in 04/2006, Bx neg for IBD  . Duodenitis     a. 05/2006 2/2 H pylori +/- NSAIDS  . History of CVA (cerebrovascular accident)   . Tobacco abuse   . Chest pain     a. 04/2009 Echo: EF 55-60%, Gr1 DD, Mild MR.  Marland Kitchen Hypertension   . Heart murmur   . Stroke   . Seizures   . Anemia   . Vitamin B12 deficiency anemia    Past Surgical History: History reviewed. No pertinent past surgical history. HPI:  52 yo with history of drug, EtOH abuse, cardiomyopathy, admitted after VF arrest, treated with hypothermia protocol.Pt was intubated fro 3/20 to 3/27 and then reintubated on 3/27 until 3/30. She has required BiPAP up to a few hours ago today.  BSE yesterday with s/s aspiration and MBS recommended.     Assessment / Plan / Recommendation Clinical Impression  Dysphagia Diagnosis: Mild oral phase dysphagia;Moderate pharyngeal phase dysphagia;Mild cervical esophageal phase dysphagia Clinical impression: Pt. exhibited mild oral dysphagia with decreased oral prep, mastication and transit with cracker and delayed transit with pill (began to masticate pill).  Moderate motor pharyngeal phase deficits include reduced laryngeal elevation, anterior hyoid elevation resulting in incomplete epiglottic deflection and frank silent aspiration with nectar consistency.  Min verbal cue to cough cleared all aspirate from trachea.  Pt. continued to aspirate despite  implementing a chin tuck posture with nectar barium.  Vallecular and pyriform sinus residue mild-moderate which pt. sensed most of the time to decrease amount to non e or minimal.  SLP recommends initiating a Dys 2 diet and honey thick liquids, pills whole in applesauce.  ST will continue to follow.    Treatment Recommendation  Therapy as outlined in treatment plan below    Diet Recommendation Dysphagia 2 (Fine chop);Honey-thick liquid   Liquid Administration via: Cup;No straw Medication Administration: Whole meds with puree Supervision: Patient able to self feed;Full supervision/cueing for compensatory strategies Compensations: Slow rate;Small sips/bites;Multiple dry swallows after each bite/sip Postural Changes and/or Swallow Maneuvers: Seated upright 90 degrees    Other  Recommendations Oral Care Recommendations: Oral care BID   Follow Up Recommendations   (to be determined)    Frequency and Duration min 2x/week  2 weeks   Pertinent Vitals/Pain     SLP Swallow Goals Patient will utilize recommended strategies during swallow to increase swallowing safety with: Minimal assistance      Reason for Referral Objectively evaluate swallowing function   Oral Phase Oral Preparation/Oral Phase Oral Phase: Impaired Oral - Solids Oral - Regular: Impaired mastication;Weak lingual manipulation;Delayed oral transit Oral - Pill: Weak lingual manipulation;Delayed oral transit (began masticating cracker)   Pharyngeal Phase Pharyngeal Phase Pharyngeal Phase: Impaired Pharyngeal - Pudding Pharyngeal - Pudding Teaspoon: Not tested Pharyngeal - Pudding Cup: Not tested Pharyngeal - Honey Pharyngeal - Honey Teaspoon: Reduced laryngeal elevation;Reduced  tongue base retraction;Pharyngeal residue - valleculae;Pharyngeal residue - pyriform sinuses Pharyngeal - Honey Cup: Reduced laryngeal elevation;Reduced tongue base retraction;Pharyngeal residue - valleculae;Pharyngeal residue - pyriform  sinuses Pharyngeal - Honey Syringe: Not tested Pharyngeal - Nectar Pharyngeal - Nectar Teaspoon: Reduced laryngeal elevation;Reduced tongue base retraction;Pharyngeal residue - valleculae;Pharyngeal residue - pyriform sinuses Pharyngeal - Nectar Cup: Reduced laryngeal elevation;Reduced tongue base retraction;Pharyngeal residue - valleculae;Pharyngeal residue - pyriform sinuses;Penetration/Aspiration during swallow;Reduced airway/laryngeal closure;Significant aspiration (Amount);Reduced epiglottic inversion;Reduced anterior laryngeal mobility Penetration/Aspiration details (nectar cup): Material enters airway, passes BELOW cords without attempt by patient to eject out (silent aspiration);Material enters airway, CONTACTS cords and not ejected out (cued coughed removed all aspirates!) Pharyngeal - Nectar Straw: Not tested Pharyngeal - Nectar Syringe: Not tested Pharyngeal - Thin Pharyngeal - Ice Chips: Not tested Pharyngeal - Thin Teaspoon: Not tested Pharyngeal - Thin Cup: Not tested Pharyngeal - Thin Straw: Not tested Pharyngeal - Thin Syringe: Not tested Pharyngeal - Solids Pharyngeal - Puree: Not tested Pharyngeal - Mechanical Soft: Not tested Pharyngeal - Regular: Pharyngeal residue - valleculae;Reduced tongue base retraction Pharyngeal - Multi-consistency: Not tested Pharyngeal - Pill: Within functional limits  Cervical Esophageal Phase    GO    Cervical Esophageal Phase Cervical Esophageal Phase: Impaired Cervical Esophageal Phase - Comment Cervical Esophageal Comment:  (tight UES)         Darrow Bussing.Ed [employer] [phone]  01/28/2013

## 2013-01-28 NOTE — Clinical Social Work Note (Signed)
Pt being reviewed for possible CIR.  Vickii Penna, LCSWA [phone]  Clinical Social Work

## 2013-01-29 ENCOUNTER — Encounter (HOSPITAL_COMMUNITY): Payer: Self-pay

## 2013-01-29 ENCOUNTER — Ambulatory Visit (HOSPITAL_COMMUNITY): Admit: 2013-01-29 | Payer: Self-pay | Admitting: Internal Medicine

## 2013-01-29 ENCOUNTER — Inpatient Hospital Stay (HOSPITAL_COMMUNITY): Payer: MEDICAID

## 2013-01-29 ENCOUNTER — Encounter (HOSPITAL_COMMUNITY)
Admission: EM | Disposition: A | Discharge status: Home Health | Payer: Self-pay | Source: Home / Self Care | Attending: Pulmonary Disease

## 2013-01-29 DIAGNOSIS — I472 Ventricular tachycardia: Secondary | ICD-10-CM

## 2013-01-29 DIAGNOSIS — I428 Other cardiomyopathies: Secondary | ICD-10-CM

## 2013-01-29 LAB — CBC WITH DIFFERENTIAL/PLATELET
Basophils Absolute: 0.1 10*3/uL (ref 0.0–0.1)
Basophils Relative: 0 % (ref 0–1)
Eosinophils Absolute: 0.2 10*3/uL (ref 0.0–0.7)
Eosinophils Relative: 1 % (ref 0–5)
Lymphs Abs: 4.5 10*3/uL — ABNORMAL HIGH (ref 0.7–4.0)
MCH: 29.5 pg (ref 26.0–34.0)
MCHC: 32.7 g/dL (ref 30.0–36.0)
Neutrophils Relative %: 66 % (ref 43–77)
Platelets: 951 10*3/uL (ref 150–400)
RBC: 3.42 MIL/uL — ABNORMAL LOW (ref 3.87–5.11)
RDW: 15.2 % (ref 11.5–15.5)

## 2013-01-29 LAB — BASIC METABOLIC PANEL
Calcium: 9.9 mg/dL (ref 8.4–10.5)
GFR calc Af Amer: >90 mL/min (ref >90)
GFR calc non Af Amer: >90 mL/min (ref >90)
Potassium: 3.6 mEq/L (ref 3.5–5.1)
Sodium: 148 mEq/L — ABNORMAL HIGH (ref 135–145)

## 2013-01-29 SURGERY — IMPLANTABLE CARDIOVERTER DEFIBRILLATOR IMPLANT
Anesthesia: LOCAL

## 2013-01-29 MED ORDER — LISINOPRIL 5 MG PO TABS
5.0000 mg | ORAL_TABLET | Freq: Every day | ORAL | Status: DC
  Administered 2013-01-29 – 2013-01-31 (×3): 5 mg via ORAL
  Filled 2013-01-29 (×3): qty 1

## 2013-01-29 MED ORDER — VITAMIN B-1 100 MG PO TABS
100.0000 mg | ORAL_TABLET | Freq: Every day | ORAL | Status: DC
  Administered 2013-01-29 – 2013-01-31 (×3): 100 mg via ORAL
  Filled 2013-01-29 (×3): qty 1

## 2013-01-29 MED ORDER — GADOBENATE DIMEGLUMINE 529 MG/ML IV SOLN
15.0000 mL | Freq: Once | INTRAVENOUS | Status: AC
  Administered 2013-01-29: 15 mL via INTRAVENOUS

## 2013-01-29 MED ORDER — PANTOPRAZOLE SODIUM 40 MG PO TBEC
40.0000 mg | DELAYED_RELEASE_TABLET | Freq: Every day | ORAL | Status: DC
  Administered 2013-01-29 – 2013-01-31 (×3): 40 mg via ORAL
  Filled 2013-01-29 (×3): qty 1

## 2013-01-29 MED ORDER — SODIUM CHLORIDE 0.9 % IJ SOLN
10.0000 mL | INTRAMUSCULAR | Status: DC | PRN
  Administered 2013-01-29: 30 mL

## 2013-01-29 MED ORDER — POTASSIUM CHLORIDE CRYS ER 20 MEQ PO TBCR
40.0000 meq | EXTENDED_RELEASE_TABLET | Freq: Once | ORAL | Status: AC
  Administered 2013-01-29: 40 meq via ORAL
  Filled 2013-01-29: qty 2

## 2013-01-29 MED ORDER — FOLIC ACID 1 MG PO TABS
1.0000 mg | ORAL_TABLET | Freq: Every day | ORAL | Status: DC
  Administered 2013-01-29 – 2013-01-31 (×3): 1 mg via ORAL
  Filled 2013-01-29 (×3): qty 1

## 2013-01-29 NOTE — Progress Notes (Signed)
PULMONARY  / CRITICAL CARE MEDICINE  Name: [name] MRN: [id number] DOB: 1961/01/26    ADMISSION DATE:  01/16/2013 CONSULTATION DATE:  01/16/2013  REFERRING MD :  EDP PRIMARY SERVICE:  PCCM  CHIEF COMPLAINT:  Cardiac arrest  BRIEF PATIENT DESCRIPTION: 52 yo with history of drug, EtOH abuse, cardiomyopathy, admitted after VF arrest, treated with hypothermia protocol.  SIGNIFICANT EVENTS / STUDIES:  3/19  VF arrest, hypothermia protocol  3/19  Cardiac cath >>> no significant CAD 3/20  2D echo >>> EF 20%, diastolic dysfunction, MR  LINES / TUBES: OETT 3/20 >>> 3/27 OETT 3/27 >> 3/30 L [place] CVL 3/20 >>>   CULTURES: 3/20  Respiratory >>> neg  ANTIBIOTICS: Unasyn 3/20 >>> 3/27  INTERVAL HISTORY:  Much more awake and better oriented Still hasn't fully passed swallow eval  VITAL SIGNS: Temp:  [98 F (36.7 C)-98.2 F (36.8 C)] 98 F (36.7 C) (04/02 0453) Pulse Rate:  [86-90] 90 (04/02 0453) Resp:  [18] 18 (04/02 0453) BP: (119-131)/(62-78) 120/62 mmHg (04/02 0936) SpO2:  [95 %-98 %] 98 % (04/02 1200) Weight:  [49.9 kg (110 lb 0.2 oz)] 49.9 kg (110 lb 0.2 oz) (04/02 0453)  HEMODYNAMICS:    VENTILATOR SETTINGS:    INTAKE / OUTPUT: Intake/Output     04/01 0701 - 04/02 0700 04/02 0701 - 04/03 0700   P.O.  240   I.V. (mL/kg)     Total Intake(mL/kg)  240 (4.8)   Urine (mL/kg/hr) 300 (0.3)    Total Output 300     Net -300 +240        Urine Occurrence 1 x      PHYSICAL EXAMINATION: General:  Appears comfortable on RA Neuro:  wide awake, able to talk, non-focal, less confused HEENT:  PERRL, OP dry Cardiovascular:  RRR, no m/r/g Lungs:  Bilateral diminished air entry, systolic murmur, no wheeze Abdomen:  Soft, nontender, bowel sounds diminished Musculoskeletal:  Moves all extremities, no edema Skin:  Intact  LABS:  Recent Labs Lab 01/22/13 1730  01/23/13 1428 01/23/13 1430  01/26/13 0430 01/26/13 1934 01/27/13 0400 01/28/13 0442 01/29/13 0510   HGB  --   < >  --   --   < > 9.7*  --  10.1* 9.9* 10.1*  WBC  --   < >  --   --   < > 20.9*  --  33.0* 17.4* 16.3*  PLT  --   < >  --   --   < > 538*  --  735* 888* 951*  NA  --   < >  --   --   < > 142  --  145 150* 148*  K  --   < >  --   --   < > 4.0  --  3.6 3.3* 3.6  CL  --   < >  --   --   < > 102  --  105 111 111  CO2  --   < >  --   --   < > 22  --  25 24 23   GLUCOSE  --   < >  --   --   < > 118*  --  111* 96 97  BUN  --   < >  --   --   < > 34*  --  34* 36* 34*  CREATININE  --   < >  --   --   < > 0.76  --  0.71 0.58 0.65  CALCIUM  --   < >  --   --   < > 9.9  --  10.0 10.1 9.9  MG  --   < >  --   --   < > 3.0*  --  3.0* 3.0*  --   PHOS  --   < >  --   --   < > 5.4*  --  5.7* 5.1*  --   AST  --   --   --   --   --   --   --  38*  --   --   ALT  --   --   --   --   --   --   --  31  --   --   ALKPHOS  --   --   --   --   --   --   --  126*  --   --   BILITOT  --   --   --   --   --   --   --  0.4  --   --   PROT  --   --   --   --   --   --   --  8.8*  --   --   ALBUMIN  --   --   --   --   --   --   --  3.5  --   --   APTT 55*  --   --   --   --   --   --  40*  --   --   INR 1.03  --   --   --   --   --   --  1.18  --   --   PHART  --   --  7.299* 7.515*  --   --  7.482*  --   --   --   PCO2ART  --   --  55.5* 29.9*  --   --  32.4*  --   --   --   PO2ART  --   --  65.0* 258.0*  --   --  43.8*  --   --   --   < > = values in this interval not displayed.  Recent Labs Lab 01/27/13 2001 01/28/13 0007 01/28/13 0420 01/28/13 0736 01/28/13 1159  GLUCAP 100* 102* 89 90 101*   Imaging: Dg Swallowing Func-speech Pathology  01/28/2013  Royce Macadamia, CCC-SLP     01/28/2013 10:35 AM Objective Swallowing Evaluation: Modified Barium Swallowing Study   Patient Details  Name: [name] MRN: [id number] Date of Birth: 1961-02-19  Today's Date: 01/28/2013 Time: 0865-7846 SLP Time Calculation (min): 20 min  Past Medical History:  Past Medical History  Diagnosis Date  . Crack cocaine  use   . ETOH abuse   . Anxiety   . Depression   . Pulmonary embolism     a. 04/2009 - treated with coumadin x 8 mos.  . DVT (deep venous thrombosis)     a. 04/2009 - treated with coumadin x 8 mos.  . Rectal bleeding     a. int hemorrhoids by colonoscopy in 04/2006, Bx neg for IBD  . Duodenitis     a. 05/2006 2/2 H pylori +/- NSAIDS  . History of CVA (cerebrovascular accident)   . Tobacco abuse   . Chest pain  a. 04/2009 Echo: EF 55-60%, Gr1 DD, Mild MR.  Marland Kitchen Hypertension   . Heart murmur   . Stroke   . Seizures   . Anemia   . Vitamin B12 deficiency anemia    Past Surgical History: History reviewed. No pertinent past  surgical history. HPI:  52 yo with history of drug, EtOH abuse, cardiomyopathy, admitted  after VF arrest, treated with hypothermia protocol.Pt was  intubated fro 3/20 to 3/27 and then reintubated on 3/27 until  3/30. She has required BiPAP up to a few hours ago today.  BSE  yesterday with s/s aspiration and MBS recommended.     Assessment / Plan / Recommendation Clinical Impression  Dysphagia Diagnosis: Mild oral phase dysphagia;Moderate  pharyngeal phase dysphagia;Mild cervical esophageal phase  dysphagia Clinical impression: Pt. exhibited mild oral dysphagia with  decreased oral prep, mastication and transit with cracker and  delayed transit with pill (began to masticate pill).  Moderate  motor pharyngeal phase deficits include reduced laryngeal  elevation, anterior hyoid elevation resulting in incomplete  epiglottic deflection and frank silent aspiration with nectar  consistency.  Min verbal cue to cough cleared all aspirate from  trachea.  Pt. continued to aspirate despite implementing a chin  tuck posture with nectar barium.  Vallecular and pyriform sinus  residue mild-moderate which pt. sensed most of the time to  decrease amount to non e or minimal.  SLP recommends initiating a  Dys 2 diet and honey thick liquids, pills whole in applesauce.   ST will continue to follow.    Treatment Recommendation   Therapy as outlined in treatment plan below    Diet Recommendation Dysphagia 2 (Fine chop);Honey-thick liquid   Liquid Administration via: Cup;No straw Medication Administration: Whole meds with puree Supervision: Patient able to self feed;Full supervision/cueing  for compensatory strategies Compensations: Slow rate;Small sips/bites;Multiple dry swallows  after each bite/sip Postural Changes and/or Swallow Maneuvers: Seated upright 90  degrees    Other  Recommendations Oral Care Recommendations: Oral care BID   Follow Up Recommendations   (to be determined)    Frequency and Duration min 2x/week  2 weeks   Pertinent Vitals/Pain     SLP Swallow Goals Patient will utilize recommended strategies during swallow to  increase swallowing safety with: Minimal assistance      Reason for Referral Objectively evaluate swallowing function   Oral Phase Oral Preparation/Oral Phase Oral Phase: Impaired Oral - Solids Oral - Regular: Impaired mastication;Weak lingual  manipulation;Delayed oral transit Oral - Pill: Weak lingual manipulation;Delayed oral transit  (began masticating cracker)   Pharyngeal Phase Pharyngeal Phase Pharyngeal Phase: Impaired Pharyngeal - Pudding Pharyngeal - Pudding Teaspoon: Not tested Pharyngeal - Pudding Cup: Not tested Pharyngeal - Honey Pharyngeal - Honey Teaspoon: Reduced laryngeal elevation;Reduced  tongue base retraction;Pharyngeal residue - valleculae;Pharyngeal  residue - pyriform sinuses Pharyngeal - Honey Cup: Reduced laryngeal elevation;Reduced  tongue base retraction;Pharyngeal residue - valleculae;Pharyngeal  residue - pyriform sinuses Pharyngeal - Honey Syringe: Not tested Pharyngeal - Nectar Pharyngeal - Nectar Teaspoon: Reduced laryngeal elevation;Reduced  tongue base retraction;Pharyngeal residue - valleculae;Pharyngeal  residue - pyriform sinuses Pharyngeal - Nectar Cup: Reduced laryngeal elevation;Reduced  tongue base retraction;Pharyngeal residue - valleculae;Pharyngeal  residue -  pyriform sinuses;Penetration/Aspiration during  swallow;Reduced airway/laryngeal closure;Significant aspiration  (Amount);Reduced epiglottic inversion;Reduced anterior laryngeal  mobility Penetration/Aspiration details (nectar cup): Material enters  airway, passes BELOW cords without attempt by patient to eject  out (silent aspiration);Material enters airway, CONTACTS cords  and not ejected out (cued coughed removed all aspirates!)  Pharyngeal - Nectar Straw: Not tested Pharyngeal - Nectar Syringe: Not tested Pharyngeal - Thin Pharyngeal - Ice Chips: Not tested Pharyngeal - Thin Teaspoon: Not tested Pharyngeal - Thin Cup: Not tested Pharyngeal - Thin Straw: Not tested Pharyngeal - Thin Syringe: Not tested Pharyngeal - Solids Pharyngeal - Puree: Not tested Pharyngeal - Mechanical Soft: Not tested Pharyngeal - Regular: Pharyngeal residue - valleculae;Reduced  tongue base retraction Pharyngeal - Multi-consistency: Not tested Pharyngeal - Pill: Within functional limits  Cervical Esophageal Phase    GO    Cervical Esophageal Phase Cervical Esophageal Phase: Impaired Cervical Esophageal Phase - Comment Cervical Esophageal Comment:  (tight UES)         Darrow Bussing.Ed CCC-SLP Pager [phone]  01/28/2013       ASSESSMENT / PLAN:  PULMONARY A:  Acute respiratory failure in setting of cardiac arrest.  Minimal oxygen requirements.  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 - Advance physical activity. - Smoking cessation. - IS and flutter valve.  CARDIOVASCULAR  Intake/Output Summary (Last 24 hours) at 01/29/13 1407 Last data filed at 01/29/13 4540  Gross per 24 hour  Intake    240 ml  Output      0 ml  Net    240 ml    A: VF cardiac arrest. Nonischemic cardiomyopathy. Acute systolic / diastolic CHF.  No hemodynamically significant CAD. Moderate Mitral regurg. Suspect  euvolemic at this point  P:  - Cardiology following. - Will need ICD / heart MRI to complete w/u. - Repeat TTE scheduled to better eval mitral valve. - Coreg, ACE-I to be dose by cardiology. - Dose any diuretics daily for now, lasix currently on hold (clinically dry, hypernatremia).  RENAL  Recent Labs Lab 01/27/13 0400 01/28/13 0442 01/29/13 0510  K 3.6 3.3* 3.6    A:  Hypokalemia P:   - Replace K. - Trend BMP.  GASTROINTESTINAL A:  Dysphagia P:   - PPI for SUP. - Still needs thickened liquids. SLP following.  HEMATOLOGIC  Recent Labs  01/28/13 0442 01/29/13 0510  HGB 9.9* 10.1*   A:  Anemia, stable. P:  - Trend CBC. - Heparin for DVT Px  INFECTIOUS A:  Suspected aspiration, completed 7 days of abx. P:   - D/c'd Unasyn 3/27.  ENDOCRINE   Recent Labs  01/28/13 0420 01/28/13 0736 01/28/13 1159  GLUCAP 89 90 101*   A:  Hyperglycemia, resolved P:   - D/c SSI on 4/1.  NEUROLOGIC A:  Encephalopathy, much improved P:   - Avoid sedating meds - Watch closely for EtOH withdrawal - Added thiamine, folate 3/31  TODAY'S SUMMARY: 52 yo with h/o drug abuse s/p VF cardiac arrest / hypothermia.  Nonischemic cardiomyopathy (EF 20%). Cardiac cath negative.  Acute on chronic resp failure. Improved. Will need cardiac MRI, probable AICD  Will transfer to [facility] service, PCCM will sign off, please call back if needed.  Alyson Reedy, M.D. [facility] Pulmonary/Critical Care Medicine. Pager: [phone]. After hours pager: [phone].

## 2013-01-29 NOTE — Consult Note (Addendum)
ELECTROPHYSIOLOGY CONSULT NOTE  [name] MRN: [id number], DOB/AGE: 51-Mar-1962   Admit date: 01/16/2013 Date of Consult: 01/29/2013  Primary Physician: None Primary Cardiologist: New to [place] Reason for Consultation: VT/VF arrest  History of Present Illness [name] is a 52 year old woman with polysubstance abuse and LBBB (documented since 2010) who was admitted on 01/16/2013 after out-of-hospital arrest. In review of her records, she presented to her PCP in February with chest pain in setting of crack cocaine use 3 days prior. This admission her urine drug screen was negative for cocaine. According to her H&P, she was found by family who then called EMS. Per EMS staff, she was found to have a shockable rhythm and received defibrillation x 4 with conversion to sinus and LBBB. On admission, a bedside echo was done revealing global LV dysfunction. She was taken to the cath lab emergently where she was found to have no CAD. She remained intubated and was cooled per protocol. She was slow to wean from mechanical ventilation due to agitation/possible withdrawal but was extubated on 01/26/2013. She required BiPAP until 01/27/2013. She has been diuresed and is no longer volume overloaded. Her WBCs have been elevated and she was treated with Unasyn for suspected aspiration PNA. Of note, she has also been anemic and had increased platelet count. She is scheduled for a cardiac MRI today to rule out an infiltrative process/myocarditis. Dr. Ladona Ridgel has been asked to see her in consultation for EP recommendations regarding ICD for secondary prevention SCD.  Past Medical History Past Medical History  Diagnosis Date  . Crack cocaine use   . ETOH abuse   . Anxiety   . Depression   . Pulmonary embolism     a. 04/2009 - treated with coumadin x 8 mos.  . DVT (deep venous thrombosis)     a. 04/2009 - treated with coumadin x 8 mos.  . Rectal bleeding     a. int hemorrhoids by colonoscopy in  04/2006, Bx neg for IBD  . Duodenitis     a. 05/2006 2/2 H pylori +/- NSAIDS  . History of CVA (cerebrovascular accident)   . Tobacco abuse   . Chest pain     a. 04/2009 Echo: EF 55-60%, Gr1 DD, Mild MR.  Marland Kitchen Hypertension   . Heart murmur   . Stroke   . Seizures   . Anemia   . Vitamin B12 deficiency anemia     Past Surgical History History reviewed. No pertinent past surgical history.   Allergies/Intolerances Allergies  Allergen Reactions  . Omnipaque (Iohexol) Other (See Comments)    Seizure   . Iodinated Diagnostic Agents     Inpatient Medications . ipratropium  0.5 mg Nebulization Q6H   And  . albuterol  2.5 mg Nebulization Q4H  . carvedilol  6.25 mg Oral BID WC  . feeding supplement  237 mL Oral BID BM  . folic acid  1 mg Oral Daily  . heparin  5,000 Units Subcutaneous Q8H  . lisinopril  5 mg Oral Daily  . pantoprazole  40 mg Oral Daily  . thiamine  100 mg Oral Daily   . sodium chloride 20 mL/hr at 01/29/13 0019    Family History Family History  Problem Relation Age of Onset  . Breast cancer    . Diabetes Mother   . CAD Mother   . Colon cancer Father   . CAD Father   . Breast cancer Sister   . Diabetes Sister   .  Diabetes Brother   . CAD Brother     Social History Social History  . Marital Status: Single   Social History Main Topics  . Smoking status: Current Every Day Smoker -- 1.00 packs/day    Types: Cigarettes  . Smokeless tobacco: Not on file     Comment: smokes about 4-5 cigs/day  . Alcohol Use: Yes     Comment: beer and liquor  . Drug Use: Yes    Special: "Crack" cocaine   Social History Narrative   Pt lives in [place] with a roommate.    Review of Systems General: No chills, fever, night sweats or weight changes  Cardiovascular:  No chest pain, dyspnea on exertion, edema, orthopnea, palpitations, paroxysmal nocturnal dyspnea Dermatological: No rash, lesions or masses Respiratory: No cough, dyspnea Urologic: No hematuria,  dysuria Abdominal: No nausea, vomiting, diarrhea, bright red blood per rectum, melena, or hematemesis Neurologic: No visual changes, weakness, changes in mental status All other systems reviewed and are otherwise negative except as noted above.  Physical Exam Blood pressure 120/62, pulse 90, temperature 98 F (36.7 C), temperature source Oral, resp. rate 18, height 5\' 4"  (1.626 m), weight 110 lb 0.2 oz (49.9 kg), SpO2 96.00%.  General: Well developed but ill appearing 52 year old female in no acute distress. HEENT: Normocephalic, atraumatic. EOMs intact. Sclera nonicteric. Oropharynx clear.  Neck: Supple without bruits. No JVD. Lungs: Respirations regular and unlabored. Bibasilar rales otherwise CTA bilaterally. No wheezes or rhonchi. Heart: RRR. S1, S2 present. II/VI systolic murmur at apex. No rub, S3 or S4. Abdomen: Soft, non-tender, non-distended. BS present x 4 quadrants. No hepatosplenomegaly.  Extremities: No clubbing, cyanosis or edema. DP/PT/Radials 2+ and equal bilaterally. Psych: Normal affect. Neuro: Alert and oriented X 3. Moves all extremities spontaneously. Musculoskeletal: No kyphosis. Skin: Intact. Warm and dry. No rashes or petechiae in exposed areas.   Labs No results found for this basename: CKTOTAL, CKMB, TROPONINI,  in the last 72 hours Lab Results  Component Value Date   WBC 16.3* 01/29/2013   HGB 10.1* 01/29/2013   HCT 30.9* 01/29/2013   MCV 90.4 01/29/2013   PLT 951* 01/29/2013    Recent Labs Lab 01/27/13 0400  01/29/13 0510  NA 145  < > 148*  K 3.6  < > 3.6  CL 105  < > 111  CO2 25  < > 23  BUN 34*  < > 34*  CREATININE 0.71  < > 0.65  CALCIUM 10.0  < > 9.9  PROT 8.8*  --   --   BILITOT 0.4  --   --   ALKPHOS 126*  --   --   ALT 31  --   --   AST 38*  --   --   GLUCOSE 111*  < > 97  < > = values in this interval not displayed. No components found with this basename: MAGNESIUM,  No components found with this basename: POCBNP,  No results found for this  basename: TSH, T4TOTAL, FREET3, T3FREE, THYROIDAB,  in the last 72 hours No results found for this basename: VITAMINB12, FOLATE, FERRITIN, TIBC, IRON, RETICCTPCT,  in the last 72 hours  Recent Labs  01/27/13 0400  INR 1.18    Radiology/Studies Dg Chest Port 1 View  01/27/2013  *RADIOLOGY REPORT*  Clinical Data: Respiratory distress.  PORTABLE CHEST - 1 VIEW  Comparison: 01/26/2013.  Findings: Trachea is midline.  Heart size normal.  Left subclavian central line tip projects over the SVC.  There  is new right lower lobe air space disease.  Possible tiny right pleural effusion. Left lung is clear.  IMPRESSION: New right lower lobe collapse/consolidation and tiny right pleural effusion.   Original Report Authenticated By: Leanna Battles, M.D.    Dg Chest Port 1 View  01/26/2013  *RADIOLOGY REPORT*  Clinical Data: Intubated, respiratory failure, substance abuse  PORTABLE CHEST - 1 VIEW  Comparison: 01/23/2013  Findings: Low endotracheal tube approximate 6 mm above the carina as before.  This could be retracted 2 cm.  Normal heart size and vascularity.  The left subclavian central line tip in the SVC. Minimal left base atelectasis otherwise clear lungs.  No edema, collapse, consolidation, effusion or pneumothorax.  Atherosclerosis of the aorta.  NG tube coiled the stomach.  IMPRESSION: Minimal left base atelectasis.  Overall improving aeration.  Low endotracheal tube just above the carina could be retracted 2 cm.   Original Report Authenticated By: Judie Petit. Shick, M.D.     Echocardiogram  Study Conclusions - Left ventricle: The cavity size was normal. Wall thickness was normal. Systolic function was severely reduced. The estimated ejection fraction was in the range of 20% to 25%. Diffuse hypokinesis. There is akinesis of the anteroseptal and apical myocardium. Doppler parameters are consistent with abnormal left ventricular relaxation (grade 1 diastolic dysfunction). - Mitral valve: Calcified annulus.  Moderate regurgitation.  Eccentric, anteriorly directed moderate MR suggests prolapse  of posterior MV leaflet. - Left atrium: The atrium was moderately dilated. - Pulmonary arteries: Systolic pressure was mildly increased. PA peak pressure: 34mm Hg (S).   Cardiac cath 01/16/2013 Hemodynamic Findings:  Central aortic pressure: 169/99  Left ventricular pressure: 162/16/25  Angiographic Findings:  Left main: No obstructive disease noted.  Left Anterior Descending Artery: Large caliber vessel that courses to the apex. There are several moderate caliber diagonal branches.  Circumflex Artery: Large caliber vessel with two moderate caliber marginal branches. No obstructive disease noted.  Right Coronary Artery: Moderate caliber, dominant vessel with no obstructive disease noted.  Left Ventricular Angiogram: Deferred.  Impression:  1. No angiographic evidence of CAD  2. S/p Cardiac arrest  Recommendations: She will be admitted to the PCCM team in the CCU with plans for [facility] cooling protocol.  Complications: None. The patient tolerated the procedure well.    12-lead ECG on admission shows sinus tachycardia at 121 bpm with LBBB, QRS duration 144 msec Telemetry shows SR; no arrhythmias   Assessment and Recommendations 1. VT/VF arrest 2. NICM with acute systolic CHF - EF 25%. UDS neg but history of crack cocaine use and EtOH abuse - question alcohol cardiomyopathy versus other etiology, awaiting cardiac MRI today. Continue Coreg and lisinopril.   3. Leukocytosis and increased platelets - smear showing atypical lymphocytes and large platelets; initial smear with absolute lymphocytosis; unclear connection to the above, ? infectious (ie - mononucleosis) vs hematologic malignancy; per primary team  4. Polysubstance abuse - cocaine and EtOH abuse - UDS negative for cocaine this admission; needs to abstain from this given need for BB   Please see Dr. Lubertha Basque recommendations  below. Signed, Rick Duff, PA-C 01/29/2013, 11:58 AM  EP attending  Patient seen and examined. I agree with the history, physical exam, and assessment as noted above. The patient sustained a ventricular fibrillation arrest in the setting of a nonischemic cardiomyopathy, and history of polysubstance abuse. She has awakened with minimal encephalopathy. She has indwelling catheter in her left subclavian vein. She has left bundle branch block and an ejection fraction of  25%. I've discussed the treatment options with the patient. I would recommend discharge with a life vest, allow her to recover from her prolonged hospitalization, and give her a trial of compliance. If she remains compliant and does not use illegal substances, ICD implantation would be recommended. She will need to be on maximal medical therapy regardless. I will arrange life vest.  Lewayne Bunting, M.D.

## 2013-01-29 NOTE — Consult Note (Signed)
Physical Medicine and Rehabilitation Consult  Reason for Consult: Cardiac arrest with deconditioning.  Referring Physician:  Dr. Sung Amabile.    HPI: [name] is a 52 y.o. female with polysubstance abuse, CP in setting of cocaine use 2/12  and LBBB (documented since 2010) who was admitted on 01/16/2013 after being found down by family. Per EMS staff, she was found to have a shockable rhythm and received defibrillation x 4 with conversion to sinus and LBBB. On admission, a bedside echo was done revealing global LV dysfunction. She was taken to the cath lab emergently where she was found to have no CAD. She remained intubated and was cooled per protocol. She was slow to wean from mechanical ventilation due to agitation/possible withdrawal but was extubated on 01/26/2013. But required BIPAP due to respiratory distress. Dr. Shirlee Latch following and recommended cardiac MRI for workup of NICM as well as evaluation for ICD prior to discharge. Started on D2, honey thick liquids due to aspiration. Therapies initiated and recommending CIR for progression.     Review of Systems  HENT: Negative for hearing loss.   Eyes: Negative for blurred vision.  Respiratory: Negative for shortness of breath.   Cardiovascular: Negative for chest pain and palpitations.  Gastrointestinal: Negative for nausea and abdominal pain.  Genitourinary: Negative for urgency and frequency.  Musculoskeletal: Negative for myalgias, back pain and joint pain.  Neurological: Negative for focal weakness and headaches.   Past Medical History  Diagnosis Date  . Crack cocaine use   . ETOH abuse   . Anxiety   . Depression   . Pulmonary embolism     a. 04/2009 - treated with coumadin x 8 mos.  . DVT (deep venous thrombosis)     a. 04/2009 - treated with coumadin x 8 mos.  . Rectal bleeding     a. int hemorrhoids by colonoscopy in 04/2006, Bx neg for IBD  . Duodenitis     a. 05/2006 2/2 H pylori +/- NSAIDS  . History of CVA  (cerebrovascular accident)   . Tobacco abuse   . Chest pain     a. 04/2009 Echo: EF 55-60%, Gr1 DD, Mild MR.  Marland Kitchen Hypertension   . Heart murmur   . Stroke   . Seizures   . Anemia   . Vitamin B12 deficiency anemia    History reviewed. No pertinent past surgical history. Family History  Problem Relation Age of Onset  . Breast cancer    . Diabetes Mother   . CAD Mother   . Colon cancer Father   . CAD Father   . Breast cancer Sister   . Diabetes Sister   . Diabetes Brother   . CAD Brother    Social History:  Lives with daughter. Unemployed since '92. She  reports that she has been smoking Cigarettes.  She has been smoking about 1.00 pack per day. She does not have any smokeless tobacco history on file.  Daughter reports that  patient drinks a lot of alcohol-- liquor and beer usually but none for a few weeks PTA.  Per reports that she uses illicit drugs ("Crack" cocaine). Daughter can provide supervision past discharge.   Allergies  Allergen Reactions  . Omnipaque (Iohexol) Other (See Comments)    Seizure   . Iodinated Diagnostic Agents    Medications Prior to Admission  Medication Sig Dispense Refill  . aspirin EC 81 MG tablet Take 81 mg by mouth daily.      . bacitracin-polymyxin b (POLYSPORIN) ointment  Apply 1 application topically 2 (two) times daily.      . cloNIDine (CATAPRES) 0.1 MG tablet Take 0.1 mg by mouth daily.      . cyanocobalamin (,VITAMIN B-12,) 1000 MCG/ML injection Inject 1,000 mcg into the muscle every 30 (thirty) days.      . famotidine (PEPCID) 20 MG tablet Take 20 mg by mouth 2 (two) times daily.        Home: Home Living Lives With: Daughter;Significant other Available Help at Discharge: Family;Available 24 hours/day Type of Home: House Home Access: Stairs to enter [employer] of Steps: 3 Entrance Stairs-Rails: None Home Layout: One level Firefighter: Standard Home Adaptive Equipment: None  Functional History: Prior  Function Vocation: Retired Functional Status:  Mobility: Bed Mobility Bed Mobility: Supine to Sit Supine to Sit: 5: Supervision;HOB flat Transfers Transfers: Sit to Stand;Stand to Sit Sit to Stand: 4: Min guard;From bed;From chair/3-in-1;With upper extremity assist Stand to Sit: 4: Min guard;To chair/3-in-1;To bed;With upper extremity assist Ambulation/Gait Ambulation/Gait Assistance: 4: Min assist Ambulation Distance (Feet): 60 Feet Assistive device: None Ambulation/Gait Assistance Details: Did not need HHA today however demonstrates increased lateral sway especially with distraction, several LOB needing minA to correct Gait Pattern: Narrow base of support;Step-through pattern;Decreased stride length Gait velocity: decreased General Gait Details: generally unsteady with increased lateral sway    ADL:    Cognition: Cognition Arousal/Alertness: Awake/alert Orientation Level: Oriented to person;Oriented to place;Oriented to time;Oriented to situation Cognition Overall Cognitive Status: Appears within functional limits for tasks assessed/performed Area of Impairment: Safety/judgement Difficult to assess due to: Other (comment) (bi-pap) Arousal/Alertness: Awake/alert Orientation Level: Disoriented to;Situation;Time;Place (thought she was at [facility] rehab) Behavior During Session: Flat affect Safety/Judgement: Impulsive  Blood pressure 120/62, pulse 90, temperature 98 F (36.7 C), temperature source Oral, resp. rate 18, height 5\' 4"  (1.626 m), weight 49.9 kg (110 lb 0.2 oz), SpO2 98.00%. Physical Exam  Nursing note and vitals reviewed. Constitutional: She is oriented to person, place, and time. She appears well-developed.  Thin female--appears older than stated age.   HENT:  Head: Normocephalic and atraumatic.  Eyes: Pupils are equal, round, and reactive to light.  Neck: Normal range of motion.  Cardiovascular: Normal rate and regular rhythm.   Pulmonary/Chest: Effort normal  and breath sounds normal.  Abdominal: Soft. Bowel sounds are normal.  Musculoskeletal: She exhibits no edema and no tenderness.  Neurological: She is alert and oriented to person, place, and time.  Speech clear. Follows commands without difficulty. Strength grossly 5/5. Good standing balance. Walked with supervision around room.   Psychiatric: She has a normal mood and affect. Her behavior is normal. Judgment and thought content normal.    Results for orders placed during the hospital encounter of 01/16/13 (from the past 24 hour(s))  CBC WITH DIFFERENTIAL     Status: Abnormal   Collection Time    01/29/13  5:10 AM      Result Value Range   WBC 16.3 (*) 4.0 - 10.5 K/uL   RBC 3.42 (*) 3.87 - 5.11 MIL/uL   Hemoglobin 10.1 (*) 12.0 - 15.0 g/dL   HCT 40.9 (*) 81.1 - 91.4 %   MCV 90.4  78.0 - 100.0 fL   MCH 29.5  26.0 - 34.0 pg   MCHC 32.7  30.0 - 36.0 g/dL   RDW 78.2  95.6 - 21.3 %   Platelets 951 (*) 150 - 400 K/uL   Neutrophils Relative 66  43 - 77 %   Neutro Abs 10.7 (*) 1.7 -  7.7 K/uL   Lymphocytes Relative 27  12 - 46 %   Lymphs Abs 4.5 (*) 0.7 - 4.0 K/uL   Monocytes Relative 6  3 - 12 %   Monocytes Absolute 1.0  0.1 - 1.0 K/uL   Eosinophils Relative 1  0 - 5 %   Eosinophils Absolute 0.2  0.0 - 0.7 K/uL   Basophils Relative 0  0 - 1 %   Basophils Absolute 0.1  0.0 - 0.1 K/uL  BASIC METABOLIC PANEL     Status: Abnormal   Collection Time    01/29/13  5:10 AM      Result Value Range   Sodium 148 (*) 135 - 145 mEq/L   Potassium 3.6  3.5 - 5.1 mEq/L   Chloride 111  96 - 112 mEq/L   CO2 23  19 - 32 mEq/L   Glucose, Bld 97  70 - 99 mg/dL   BUN 34 (*) 6 - 23 mg/dL   Creatinine, Ser 1.61  0.50 - 1.10 mg/dL   Calcium 9.9  8.4 - 09.6 mg/dL   GFR calc non Af Amer >90  >90 mL/min   GFR calc Af Amer >90  >90 mL/min  PATHOLOGIST SMEAR REVIEW     Status: None   Collection Time    01/29/13  5:10 AM      Result Value Range   Path Review              Assessment/Plan: Diagnosis:  cardiac arrest 1. Does the need for close, 24 hr/day medical supervision in concert with the patient's rehab needs make it unreasonable for this patient to be served in a less intensive setting? No 2. Co-Morbidities requiring supervision/potential complications: see above 3. Due to bladder management, bowel management, safety and disease management, does the patient require 24 hr/day rehab nursing? No 4. Does the patient require coordinated care of a physician, rehab nurse, PT to address physical and functional deficits in the context of the above medical diagnosis(es)? No Addressing deficits in the following areas: balance and locomotion 5. Can the patient actively participate in an intensive therapy program of at least 3 hrs of therapy per day at least 5 days per week? No 6. The potential for patient to make measurable gains while on inpatient rehab is fair 7. Anticipated functional outcomes upon discharge from inpatient rehab are n/a with PT, n/a with OT, n/a with SLP. 8. Estimated rehab length of stay to reach the above functional goals is: n/a 9. Does the patient have adequate social supports to accommodate these discharge functional goals? Potentially 10. Anticipated D/C setting: Home 11. Anticipated post D/C treatments: HH therapy 12. Overall Rehab/Functional Prognosis: excellent  RECOMMENDATIONS: This patient's condition is appropriate for continued rehabilitative care in the following setting: HHPT Patient has agreed to participate in recommended program. Yes Note that insurance prior authorization may be required for reimbursement for recommended care.  Comment:  Ranelle Oyster, MD, [place] [facility]   01/29/2013

## 2013-01-29 NOTE — Progress Notes (Signed)
SLP Cancellation Note  Patient Details Name: [name] MRN: [id number] DOB: [birth year]   Cancelled treatment:       Reason Eval/Treat Not Completed: Medical issues which prohibited therapy. Pt NPO for procedure. RN reports pt tolerated modified diet well. SLP will f/u when pt able to participate.    Clark Cuff, Riley Nearing 01/29/2013, 10:36 AM

## 2013-01-29 NOTE — Progress Notes (Signed)
Daughter came to desk wanting to take her mother ( pt) outside. Explained to family that her mother is being monitored and that she has a central line IV. No current order to allow pt off the floor or outside with family members. Charge nurse also spoke with pt's family. Will make a call on pt's behalf to MD to ask for off the floor order.

## 2013-01-29 NOTE — Progress Notes (Signed)
MD came to see pt. No order for pt to be able to travel off unit with family. MD explained to family that pt is too weak and needs to be more closely monitored at this time. Pt is on tele and has a central line. Awaiting  ICD placement

## 2013-01-29 NOTE — Progress Notes (Signed)
[name], female   DOB: 1961-04-19, 52 y.o.   MRN: [id number]     SUBJECTIVE: Doing well this morning.  She has been walking, gets tired with prolonged walking.    Marland Kitchen ipratropium  0.5 mg Nebulization Q6H   And  . albuterol  2.5 mg Nebulization Q4H  . carvedilol  6.25 mg Oral BID WC  . feeding supplement  237 mL Oral BID BM  . folic acid  1 mg Oral Daily  . heparin  5,000 Units Subcutaneous Q8H  . lisinopril  5 mg Oral Daily  . pantoprazole  40 mg Oral Daily  . thiamine  100 mg Oral Daily    Filed Vitals:   01/29/13 0139 01/29/13 0301 01/29/13 0453 01/29/13 0748  BP: 122/74  119/64   Pulse: 88  90   Temp: 98.2 F (36.8 C)  98 F (36.7 C)   TempSrc: Oral  Oral   Resp: 18  18   Height:      Weight:   110 lb 0.2 oz (49.9 kg)   SpO2: 97% 98% 97% 96%    Intake/Output Summary (Last 24 hours) at 01/29/13 0759 Last data filed at 01/28/13 0930  Gross per 24 hour  Intake      0 ml  Output    300 ml  Net   -300 ml    LABS: Basic Metabolic Panel:  Recent Labs  14/78/29 0400 01/28/13 0442 01/29/13 0510  NA 145 150* 148*  K 3.6 3.3* 3.6  CL 105 111 111  CO2 25 24 23   GLUCOSE 111* 96 97  BUN 34* 36* 34*  CREATININE 0.71 0.58 0.65  CALCIUM 10.0 10.1 9.9  MG 3.0* 3.0*  --   PHOS 5.7* 5.1*  --    Liver Function Tests:  Recent Labs  01/27/13 0400  AST 38*  ALT 31  ALKPHOS 126*  BILITOT 0.4  PROT 8.8*  ALBUMIN 3.5   No results found for this basename: LIPASE, AMYLASE,  in the last 72 hours CBC:  Recent Labs  01/28/13 0442 01/29/13 0510  WBC 17.4* 16.3*  NEUTROABS 11.8* 10.7*  HGB 9.9* 10.1*  HCT 30.1* 30.9*  MCV 90.1 90.4  PLT 888* 951*   PHYSICAL EXAM General: awake/alert, NAD Neck: No JVD, no thyromegaly or thyroid nodule.  Lungs: Dependent crackles CV: Nondisplaced PMI.  Heart regular S1/S2, no S3/S4, 2/6 HSM apex.  No peripheral edema.  Abdomen: Soft, no hepatosplenomegaly, no distention.  Extremities: No clubbing or  cyanosis.   TELEMETRY: Reviewed telemetry pt in NSR  ASSESSMENT AND PLAN: 52 yo with history of substance abuse had out of hospital arrest with defibrillation in the field.  Cath without significant CAD.   1. CHF: Acute on chronic systolic CHF, EF 56% (nonischemic cardiomyopathy). Urine drug screen negative, and she denies any drug use for over a year. She has been a heavy drinker and may have an ETOH cardiomyopathy.  - Does not appear volume overloaded, no lasix at this time.  - Continue Coreg, add lisinopril. - Cardiac MRI today to assess for evidence of infiltrative disease/myocarditis.  2. Mitral regurgitation: Prominent murmur. Read as moderate on initial echo. May be functional. Repeat limited echo when optimized prior to discharge to reassess MR.  3. Cardiac arrest: Will need evaluation for ICD for secondary prevention.  Discussed with Dr. Ladona Ridgel, made NPO this morning.   4. Pulmonary: Possible aspiration pneumonitis, improved.   [name] 01/29/2013 7:59 AM

## 2013-01-30 ENCOUNTER — Inpatient Hospital Stay (HOSPITAL_COMMUNITY): Payer: MEDICAID

## 2013-01-30 DIAGNOSIS — R5381 Other malaise: Secondary | ICD-10-CM

## 2013-01-30 DIAGNOSIS — I469 Cardiac arrest, cause unspecified: Secondary | ICD-10-CM

## 2013-01-30 LAB — CBC WITH DIFFERENTIAL/PLATELET
Basophils Absolute: 0.1 10*3/uL (ref 0.0–0.1)
Eosinophils Absolute: 0.3 10*3/uL (ref 0.0–0.7)
Eosinophils Relative: 2 % (ref 0–5)
Lymphocytes Relative: 37 % (ref 12–46)
Lymphs Abs: 4.5 10*3/uL — ABNORMAL HIGH (ref 0.7–4.0)
Neutrophils Relative %: 54 % (ref 43–77)
Platelets: 917 10*3/uL (ref 150–400)
RBC: 3.16 MIL/uL — ABNORMAL LOW (ref 3.87–5.11)
RDW: 15.3 % (ref 11.5–15.5)
WBC: 12 10*3/uL — ABNORMAL HIGH (ref 4.0–10.5)

## 2013-01-30 LAB — MAGNESIUM: Magnesium: 2.8 mg/dL — ABNORMAL HIGH (ref 1.5–2.5)

## 2013-01-30 LAB — BASIC METABOLIC PANEL
CO2: 22 mEq/L (ref 19–32)
Calcium: 9.6 mg/dL (ref 8.4–10.5)
GFR calc non Af Amer: >90 mL/min (ref >90)
Glucose, Bld: 88 mg/dL (ref 70–99)
Potassium: 3.7 mEq/L (ref 3.5–5.1)
Sodium: 143 mEq/L (ref 135–145)

## 2013-01-30 MED ORDER — CARVEDILOL 6.25 MG PO TABS: 6.2500 mg | ORAL_TABLET | Freq: Two times a day (BID) | ORAL | Status: DC

## 2013-01-30 MED ORDER — IPRATROPIUM BROMIDE 0.02 % IN SOLN: 0.5000 mg | RESPIRATORY_TRACT | Status: DC | PRN

## 2013-01-30 MED ORDER — LISINOPRIL 5 MG PO TABS: 5.0000 mg | ORAL_TABLET | Freq: Every day | ORAL | Status: DC

## 2013-01-30 MED ORDER — ALBUTEROL SULFATE (5 MG/ML) 0.5% IN NEBU: 2.5000 mg | INHALATION_SOLUTION | RESPIRATORY_TRACT | Status: DC | PRN

## 2013-01-30 MED ORDER — RESOURCE THICKENUP CLEAR PO POWD
ORAL | Status: DC | PRN
  Filled 2013-01-30: qty 125

## 2013-01-30 MED ORDER — IPRATROPIUM BROMIDE 0.02 % IN SOLN: 0.5000 mg | Freq: Four times a day (QID) | RESPIRATORY_TRACT | Status: DC

## 2013-01-30 MED ORDER — STARCH (THICKENING) PO POWD: ORAL | Status: DC

## 2013-01-30 MED ORDER — RESOURCE THICKENUP CLEAR PO POWD: ORAL | Status: DC

## 2013-01-30 MED ORDER — THIAMINE HCL 100 MG PO TABS: 100.0000 mg | ORAL_TABLET | Freq: Every day | ORAL | Status: DC

## 2013-01-30 MED ORDER — FOLIC ACID 1 MG PO TABS: 1.0000 mg | ORAL_TABLET | Freq: Every day | ORAL | Status: DC

## 2013-01-30 NOTE — Procedures (Signed)
SLP reviewed and agree with student findings.   Nickoli Bagheri MA, CCC-SLP [phone]    

## 2013-01-30 NOTE — Progress Notes (Signed)
Speech Language Pathology Dysphagia Treatment Patient Details Name: [name] MRN: [id number] DOB: 12-25-1960 Today's Date: 01/30/2013 Time: 5284-1324 SLP Time Calculation (min): 15 min  Assessment / Plan / Recommendation Clinical Impression  Pt seen for skilled dysphagia treatment, family education re: current diet, compensatory strategies, previous testing results, etc.  Pt's voice today is clear but dysphonic and cough remains weak.  Pt found to have thin water with straws sitting at bedside and she has been consuming this consistency per pt and daughter.  RN reports pt will not consume thickened drinks.  SLP reviewed results of MBS with pt, daughter and RN and indication for testing prior to dietary changes.    Upon review of previous MBS with sensorimotor impairment noted as well as continued recommend repeat MBS.   SLP did not observe pt with po today but in light of plan to dc pt today, rec proceed with testing to allow least restrictive diet.    Orders obtained.  Pt and daughter agreeable, test to be done today at 1300.  Thanks for the consult.      Diet Recommendation  Continue with Current Diet: Honey-thick liquid;Dysphagia 3 (mechanical soft) (pending mbs today)    SLP Plan MBS   Pertinent Vitals/Pain Afebrile, decreased   Swallowing Goals  SLP Swallowing Goals Swallow Study Goal #3 - Progress: Progressing toward goal  General Temperature Spikes Noted: No Respiratory Status: Supplemental O2 delivered via (comment) Behavior/Cognition: Alert;Cooperative;Pleasant mood Oral Cavity - Dentition: Adequate natural dentition Patient Positioning: Upright in bed  Oral Cavity - Oral Hygiene   oral cavity clean  Dysphagia Treatment Treatment focused on: Patient/family/caregiver education Family/Caregiver Educated: daughter [name] Patient observed directly with PO's: No Type of cueing: Verbal Amount of cueing: Moderate   GO     Amber Burnet, MS [facility]  SLP [phone]

## 2013-01-30 NOTE — Progress Notes (Signed)
Rehab admissions - Evaluated for possible admission.  Please see rehab consult done by Dr. Riley Kill today recommending home with [facility] follow up.  Agree with home with HH.  Call me for questions.  #[phone]

## 2013-01-30 NOTE — Progress Notes (Signed)
SLP reviewed and agree with student findings.   Tayja Manzer MA, CCC-SLP [phone]    

## 2013-01-30 NOTE — Procedures (Signed)
Objective Swallowing Evaluation: Modified Barium Swallowing Study  Patient Details  Name: [name] MRN: [id number] Date of Birth: [birth year]  Today's Date: 01/30/2013 Time: 1400-1420 SLP Time Calculation (min): 20 min  Past Medical History:  Past Medical History  Diagnosis Date  . Crack cocaine use   . ETOH abuse   . Anxiety   . Depression   . Pulmonary embolism     a. 04/2009 - treated with coumadin x 8 mos.  . DVT (deep venous thrombosis)     a. 04/2009 - treated with coumadin x 8 mos.  . Rectal bleeding     a. int hemorrhoids by colonoscopy in 04/2006, Bx neg for IBD  . Duodenitis     a. 05/2006 2/2 H pylori +/- NSAIDS  . History of CVA (cerebrovascular accident)   . Tobacco abuse   . Chest pain     a. 04/2009 Echo: EF 55-60%, Gr1 DD, Mild MR.  Marland Kitchen Hypertension   . Heart murmur   . Stroke   . Seizures   . Anemia   . Vitamin B12 deficiency anemia    Past Surgical History: History reviewed. No pertinent past surgical history. HPI:  52 yo with history of drug, EtOH abuse, cardiomyopathy, admitted after VF arrest, treated with hypothermia protocol.Pt was intubated fro 3/20 to 3/27 and then reintubated on 3/27 until 3/30. She has required BiPAP up to a few hours ago today.  BSE yesterday with s/s aspiration and MBS recommended.     Assessment / Plan / Recommendation Clinical Impression  Dysphagia Diagnosis: Mild oral phase dysphagia;Moderate pharyngeal phase dysphagia Clinical impression: Pt presents with a sensory-motor based oropharyngeal dysphagia characterized by a delay in swallow initiation most likely due to sensory impairments from prolonged ventilator dependency. Pt with slightly reduced laryngeal elevation combined with delay in swallow initiation resulted in aspiration/penetration of thin and nectar thick liquids with no attempts to clear with spontaneous cough and an increase in vallecualr and pyriform sinus residuals. Supraglottic swallow and compensartory chin  tuck strategy were implemented , however were ineffective at reducing occurence of aspiration. SLP used mod verbal cues for patient to clear aspirates with voluntary cough and effortful swallow which proved mod effective in clearing aspirates. SLP recommends pt remain on dys-3 diet with honey thick liquids with f/u MBS scheduled post d/c for possible diet advancement.     Treatment Recommendation  F/U MBS in ___ days (Comment) (51)    Diet Recommendation Dysphagia 3 (Mechanical Soft);Honey-thick liquid   Liquid Administration via: Cup;No straw Medication Administration: Whole meds with puree Supervision: Patient able to self feed;Full supervision/cueing for compensatory strategies Compensations: Slow rate;Small sips/bites;Multiple dry swallows after each bite/sip;Clear throat intermittently Postural Changes and/or Swallow Maneuvers: Seated upright 90 degrees;Upright 30-60 min after meal    Other  Recommendations Recommended Consults: MBS Oral Care Recommendations: Oral care QID Other Recommendations: Order thickener from pharmacy;Remove water pitcher;Prohibited food (jello, ice cream, thin soups)   Follow Up Recommendations  Home health SLP    Frequency and Duration min 2x/week  2 weeks   Pertinent Vitals/Pain None reported    SLP Swallow Goals Patient will utilize recommended strategies during swallow to increase swallowing safety with: Minimal assistance Swallow Study Goal #2 - Progress: Progressing toward goal   General HPI: 52 yo with history of drug, EtOH abuse, cardiomyopathy, admitted after VF arrest, treated with hypothermia protocol.Pt was intubated fro 3/20 to 3/27 and then reintubated on 3/27 until 3/30. She has required BiPAP up to a few  hours ago today.  BSE yesterday with s/s aspiration and MBS recommended. Type of Study: Modified Barium Swallowing Study Reason for Referral: Objectively evaluate swallowing function Previous Swallow Assessment: See MBS from 4/1 Diet  Prior to this Study: Honey-thick liquids;Dysphagia 3 (soft) Temperature Spikes Noted: No Respiratory Status: Room air History of Recent Intubation: Yes Length of Intubations (days): 11 days Behavior/Cognition: Alert;Cooperative;Pleasant mood Oral Cavity - Dentition: Adequate natural dentition Oral Motor / Sensory Function: Impaired - see Bedside swallow eval Self-Feeding Abilities: Able to feed self Patient Positioning: Upright in bed Baseline Vocal Quality: Low vocal intensity;Hoarse Volitional Cough: Strong Volitional Swallow: Able to elicit Anatomy: Within functional limits Pharyngeal Secretions: Not observed secondary MBS    Reason for Referral Objectively evaluate swallowing function   Oral Phase Oral Preparation/Oral Phase Oral Phase: Impaired Oral - Honey Oral - Honey Cup: Reduced posterior propulsion;Lingual/palatal residue;Delayed oral transit Oral - Nectar Oral - Nectar Teaspoon: Reduced posterior propulsion;Lingual/palatal residue;Delayed oral transit Oral - Nectar Cup: Weak lingual manipulation;Reduced posterior propulsion;Lingual/palatal residue;Delayed oral transit Oral - Thin Oral - Thin Teaspoon: Reduced posterior propulsion;Lingual/palatal residue;Delayed oral transit Oral - Thin Cup: Reduced posterior propulsion;Lingual/palatal residue;Delayed oral transit Oral - Solids Oral - Puree: Lingual/palatal residue;Reduced posterior propulsion;Delayed oral transit Oral - Mechanical Soft: Reduced posterior propulsion;Lingual/palatal residue;Delayed oral transit Oral - Regular: Reduced posterior propulsion;Lingual/palatal residue;Delayed oral transit Oral - Pill: Not tested   Pharyngeal Phase Pharyngeal Phase Pharyngeal Phase: Impaired Pharyngeal - Pudding Pharyngeal - Pudding Teaspoon: Not tested Pharyngeal - Pudding Cup: Not tested Pharyngeal - Honey Pharyngeal - Honey Teaspoon: Not tested Pharyngeal - Honey Cup: Delayed swallow initiation;Premature spillage to  valleculae;Reduced laryngeal elevation;Reduced tongue base retraction;Pharyngeal residue - valleculae;Pharyngeal residue - pyriform sinuses Pharyngeal - Honey Syringe: Not tested Pharyngeal - Nectar Pharyngeal - Nectar Teaspoon: Delayed swallow initiation;Premature spillage to valleculae;Reduced tongue base retraction;Reduced laryngeal elevation;Pharyngeal residue - pyriform sinuses;Penetration/Aspiration before swallow Penetration/Aspiration details (nectar teaspoon): Material enters airway, passes BELOW cords without attempt by patient to eject out (silent aspiration) Pharyngeal - Nectar Cup: Delayed swallow initiation;Premature spillage to valleculae;Reduced laryngeal elevation;Reduced tongue base retraction;Penetration/Aspiration before swallow;Reduced airway/laryngeal closure;Pharyngeal residue - pyriform sinuses Penetration/Aspiration details (nectar cup): Material enters airway, passes BELOW cords without attempt by patient to eject out (silent aspiration) Pharyngeal - Nectar Straw: Not tested Pharyngeal - Nectar Syringe: Not tested Pharyngeal - Thin Pharyngeal - Ice Chips: Not tested Pharyngeal - Thin Teaspoon: Delayed swallow initiation;Premature spillage to valleculae;Reduced airway/laryngeal closure;Reduced tongue base retraction;Penetration/Aspiration before swallow Penetration/Aspiration details (thin teaspoon): Material enters airway, passes BELOW cords without attempt by patient to eject out (silent aspiration) Pharyngeal - Thin Cup: Delayed swallow initiation;Premature spillage to valleculae;Reduced tongue base retraction;Reduced airway/laryngeal closure;Penetration/Aspiration before swallow;Pharyngeal residue - pyriform sinuses Penetration/Aspiration details (thin cup): Material enters airway, passes BELOW cords without attempt by patient to eject out (silent aspiration) Pharyngeal - Thin Straw: Not tested Pharyngeal - Thin Syringe: Not tested Pharyngeal - Solids Pharyngeal -  Puree: Delayed swallow initiation;Premature spillage to valleculae;Pharyngeal residue - valleculae;Reduced tongue base retraction Pharyngeal - Mechanical Soft: Delayed swallow initiation;Premature spillage to valleculae;Reduced tongue base retraction;Pharyngeal residue - valleculae;Pharyngeal residue - pyriform sinuses Pharyngeal - Regular: Not tested Pharyngeal - Multi-consistency: Not tested Pharyngeal - Pill: Not tested  Cervical Esophageal Phase    GO             Berdine Dance SLP student Berdine Dance 01/30/2013, 3:26 PM

## 2013-01-30 NOTE — Progress Notes (Signed)
Speech Language Pathology Dysphagia Treatment Patient Details Name: [name] MRN: [id number] DOB: 01-03-1961 Today's Date: 01/30/2013 Time: 1400-1420 SLP Time Calculation (min): 20 min  Assessment / Plan / Recommendation Clinical Impression  Treatment focused on pt/family education, use of compensatory strategies, and demonstration of preparation of honey thick-liquids. Pt reported having a strong dislike for honey-thick liquids (resulting in noncompliance of diet recommendation), however stated she had only had prethickened liquids. SLP provided verbal and visual cues with extensive education on how to thicken liquids properly to both pt and her daughter in attempt to decrease aspiration risk and maintain pt's compliance with current diet recommendation. Pt then returned demonstration with minimal cueing. SLP recommends pt f/u for repeat MBS post d/c for possible diet upgrade, as well as outpatient SLP, pt and daughter in agreeance.    Diet Recommendation  Continue with Current Diet: Dysphagia 3 (mechanical soft);Honey-thick liquid    SLP Plan MBS (as OP in one week)   Pertinent Vitals/Pain None reported   Swallowing Goals  SLP Swallowing Goals Patient will utilize recommended strategies during swallow to increase swallowing safety with: Minimal assistance Swallow Study Goal #2 - Progress: Progressing toward goal  General Temperature Spikes Noted: No Respiratory Status: Room air Behavior/Cognition: Alert;Cooperative;Pleasant mood Oral Cavity - Dentition: Adequate natural dentition Patient Positioning: Upright in bed  Oral Cavity - Oral Hygiene     Dysphagia Treatment Treatment focused on: Patient/family/caregiver education Family/Caregiver Educated: daughter [name] Treatment Methods/Modalities: Skilled observation Patient observed directly with PO's: Yes Type of PO's observed: Honey-thick liquids Feeding: Able to feed self Liquids provided via: Cup Type of cueing:  Verbal Amount of cueing: Minimal   GO   Berdine Dance SLP student   Berdine Dance 01/30/2013, 3:29 PM

## 2013-01-30 NOTE — Progress Notes (Signed)
[name], female   DOB: January 21, 1961, 52 y.o.   MRN: [id number]   SUBJECTIVE: Doing well this morning.  She has been walking, gets tired with prolonged walking.  Cardiac MRI yesterday with EF up to 51%.    Marland Kitchen ipratropium  0.5 mg Nebulization Q6H   And  . albuterol  2.5 mg Nebulization Q4H  . carvedilol  6.25 mg Oral BID WC  . feeding supplement  237 mL Oral BID BM  . folic acid  1 mg Oral Daily  . heparin  5,000 Units Subcutaneous Q8H  . lisinopril  5 mg Oral Daily  . pantoprazole  40 mg Oral Daily  . thiamine  100 mg Oral Daily    Filed Vitals:   01/29/13 2116 01/29/13 2319 01/30/13 0308 01/30/13 0638  BP: 108/64   107/63  Pulse: 82   89  Temp: 98.9 F (37.2 C)   98.6 F (37 C)  TempSrc: Oral   Oral  Resp: 17   18  Height:      Weight:    111 lb 8 oz (50.576 kg)  SpO2: 99% 98% 97% 98%    Intake/Output Summary (Last 24 hours) at 01/30/13 0805 Last data filed at 01/29/13 1900  Gross per 24 hour  Intake    480 ml  Output      0 ml  Net    480 ml    LABS: Basic Metabolic Panel:  Recent Labs  14/78/29 0442 01/29/13 0510 01/30/13 0530  NA 150* 148* 143  K 3.3* 3.6 3.7  CL 111 111 109  CO2 24 23 22   GLUCOSE 96 97 88  BUN 36* 34* 29*  CREATININE 0.58 0.65 0.70  CALCIUM 10.1 9.9 9.6  MG 3.0*  --  2.8*  PHOS 5.1*  --  5.3*   Liver Function Tests: No results found for this basename: AST, ALT, ALKPHOS, BILITOT, PROT, ALBUMIN,  in the last 72 hours No results found for this basename: LIPASE, AMYLASE,  in the last 72 hours CBC:  Recent Labs  01/29/13 0510 01/30/13 0530  WBC 16.3* 12.0*  NEUTROABS 10.7* 6.5  HGB 10.1* 9.2*  HCT 30.9* 28.3*  MCV 90.4 89.6  PLT 951* 917*   PHYSICAL EXAM General: awake/alert, NAD Neck: No JVD, no thyromegaly or thyroid nodule.  Lungs: Dependent crackles CV: Nondisplaced PMI.  Heart regular S1/S2, no S3/S4, 1/6 HSM apex.  No peripheral edema.  Abdomen: Soft, no hepatosplenomegaly, no distention.    Extremities: No clubbing or cyanosis.   TELEMETRY: Reviewed telemetry pt in NSR  ASSESSMENT AND PLAN: 53 yo with history of substance abuse had out of hospital arrest with defibrillation in the field.  Cath without significant CAD.   1. CHF: Acute on chronic systolic CHF, EF 56% noted on admission echo. Urine drug screen negative, and she denies any drug use for over a year. EF improved to 51% on cardiac MRI yesterday. Septal wall motion abnormality likely due to LBBB.  Suspect EF was down post-arrest due to stunning. Continue Coreg and lisinopril at current doses.  2. Mitral regurgitation: Murmur less prominent, suspect improvement with improvement in EF.  Will get echo in future to reassess.  3. Cardiac arrest: ? Cause.  No CAD.  It appears initial low EF may have been related to stunning in the setting of cardiac arrest.  UDS negative.  K was low initially, so may have been related to hypokalemia, ?also consider withdrawing from ETOH.  A bit of  a quandary for ICD (secondary prevention).  Agree with Life Vest and followup for compliance.  4. Pulmonary: Possible aspiration pneumonitis, resolved. 5. Noted plan for inpatient rehab.    [name] 01/30/2013 8:05 AM

## 2013-01-30 NOTE — Evaluation (Signed)
Occupational Therapy Evaluation Patient Details Name: [name] MRN: [id number] DOB: 1961/05/27 Today's Date: 01/30/2013 Time: 7829-5621 OT Time Calculation (min): 20 min  OT Assessment / Plan / Recommendation Clinical Impression  Pt is a 52 yr old female admitted after suffering a cardiac arrest.  Presents with overall decreased endurance and slightly decreased balance from prior functional level.  Overall modified independent however with simulated selfcare tasks and functional transfers.  Pt will have PRN supervision from her daughter at discharge and feel  she does not warrant any further OT needs.  PT can address slight higher level balance impairment.  Do feel pt will benefit initially from tub shower seat for energy conservation and safety.      OT Assessment  Patient does not need any further OT services    Follow Up Recommendations  No OT follow up       Equipment Recommendations  Tub/shower seat          Precautions / Restrictions Precautions Precautions: None Restrictions Weight Bearing Restrictions: No   Pertinent Vitals/Pain No report of pain and vitals/O2 stable during session    ADL  Eating/Feeding: Simulated;Independent Where Assessed - Eating/Feeding: Edge of bed Grooming: Simulated;Modified independent Where Assessed - Grooming: Unsupported standing Upper Body Bathing: Simulated;Modified independent Where Assessed - Upper Body Bathing: Unsupported sitting Lower Body Bathing: Simulated;Modified independent Where Assessed - Lower Body Bathing: Unsupported sit to stand Upper Body Dressing: Simulated;Modified independent Where Assessed - Upper Body Dressing: Unsupported sitting Lower Body Dressing: Simulated;Modified independent Where Assessed - Lower Body Dressing: Supported sit to Pharmacist, hospital: Performed;Modified independent Toilet Transfer Method: Other (comment) (ambulate without assistive device) Toilet Transfer Equipment: Regular height  toilet Toileting - Clothing Manipulation and Hygiene: Simulated;Modified independent Where Assessed - Toileting Clothing Manipulation and Hygiene: Sit to stand from 3-in-1 or toilet Tub/Shower Transfer: Simulated;Supervision/safety Tub/Shower Transfer Method: Ambulating Equipment Used: Gait belt Transfers/Ambulation Related to ADLs: Pt overall modified independent for mobility during OT session.  Moves slower than normal gait speed but no LOB noted. ADL Comments: Pt overall modified independent for selfcare tasks and functional transfers.  Recommend tub shower seat for home to help conserve energy as well as continuing to work on endurance with performance of ADLS.  Instructed daughter to not do too much for pt and to let her perform as much as possible with rest breaks PRN.      Visit Information  Last OT Received On: 01/30/13 Assistance Needed: +1    Subjective Data  Subjective: I feel better. Patient Stated Goal: Did not state but agreeable to participate in OT session.   Prior Functioning     Home Living Lives With: Daughter;Significant other Available Help at Discharge: Family;Available 24 hours/day Type of Home: House Home Access: Stairs to enter [employer] of Steps: 3 Entrance Stairs-Rails: None Home Layout: One level Bathroom Shower/Tub: Engineer, manufacturing systems: Standard Bathroom Accessibility: Yes Home Adaptive Equipment: None Prior Function Level of Independence: Independent Vocation: Retired Musician: No difficulties (bi-pap) Dominant Hand: Right         Vision/Perception Vision - History Baseline Vision: No visual deficits Patient Visual Report: No change from baseline Vision - Assessment Eye Alignment: Within Functional Limits Vision Assessment: Vision not tested Perception Perception: Within Functional Limits Praxis Praxis: Intact   Cognition  Cognition Overall Cognitive Status: Appears within functional  limits for tasks assessed/performed Arousal/Alertness: Awake/alert Orientation Level: Oriented X4 / Intact Behavior During Session: [facility] for tasks performed    Extremity/Trunk Assessment Right Upper  Extremity Assessment RUE ROM/Strength/Tone: Within functional levels RUE Sensation: WFL - Light Touch RUE Coordination: WFL - gross/fine motor Left Upper Extremity Assessment LUE ROM/Strength/Tone: Within functional levels LUE Sensation: WFL - Light Touch LUE Coordination: WFL - gross/fine motor Trunk Assessment Trunk Assessment: Normal     Mobility Bed Mobility Bed Mobility: Supine to Sit Supine to Sit: 7: Independent Transfers Transfers: Sit to Stand;Stand to Sit Sit to Stand: With upper extremity assist;6: Modified independent (Device/Increase time);From toilet Stand to Sit: 6: Modified independent (Device/Increase time);To toilet;With upper extremity assist        Balance Balance Balance Assessed: Yes Dynamic Standing Balance Dynamic Standing - Balance Support: No upper extremity supported Dynamic Standing - Level of Assistance: 6: Modified independent (Device/Increase time)   End of Session OT - End of Session Equipment Utilized During Treatment: Gait belt Activity Tolerance: Patient tolerated treatment well Patient left: in bed;with call bell/phone within reach;with family/visitor present Nurse Communication: Mobility status     Meckenzie Balsley OTR/L Pager number [phone] 01/30/2013, 2:31 PM

## 2013-01-30 NOTE — Progress Notes (Signed)
01/30/13 1130 In to speak with pt. about home health needs and discharge planning.  Noted orders for [facility]est as well.  TC to [place], with [facility], to give referral for [facility].  Daughter of pt. is a Public relations account executive.  At this time, the pt. does not want home health services.  Pt. to be dc home today with daughter. Tera Mater, RN, BSN NCM [phone]

## 2013-01-30 NOTE — Progress Notes (Signed)
Per MD order, central line removed. IV cathter intact. Vaseline pressure gauze to site, pressure held x 5 min, no bleeding to site. Pt instructed not to get out of bed for 30 min after the removal of the central line. Instucted to keep dressing CDI x 24hours, if bleeding occurs hold pressure, if bleeding does not stop contact MD or go to the ED. Pt verbalized understanding and did not have any questions. Cristina Ceniceros M  

## 2013-01-30 NOTE — Discharge Summary (Addendum)
Physician Discharge Summary     [name] MRN: [id number] DOB/AGE: 1960/12/10 52 y.o.  Admit date: 01/16/2013 Discharge date: 01/30/2013  Admission Diagnoses: VF arrest Discharge Diagnoses:  Principal Problem:   Cardiac arrest Active Problems:   Ventricular fibrillation   Acute respiratory failure with hypoxia   Pulmonary edema   Aspiration into respiratory tract   Hypertension   Polysubstance abuse   Alcohol abuse   Smoker   Hypokalemia   Left bundle branch block   Thrombocytopenia, mild   Hyperglycemia, no hx of DM   Anoxic encephalopathy   Mitral regurgitation   Nonischemic cardiomyopathy   Cardiomyopathy   Significant Hospital tests/ studies/ interventions and procedures   SIGNIFICANT EVENTS / STUDIES:  3/19 VF arrest, hypothermia protocol  3/19 Cardiac cath >>> no significant CAD  3/20 2D echo >>> EF 20%, diastolic dysfunction, MR  4/2 cardiac MRI: EF up to 51% LINES / TUBES:  OETT 3/20 >>> 3/27  OETT 3/27 >> 3/30  L [place] CVL 3/20 >>> 4/3  CULTURES:  3/20 Respiratory >>> neg   ANTIBIOTICS:  Unasyn 3/20 >>> 3/27   HPI  61 F with no known PMH but reportedly with hx of drug abuse brought to [facility] ED from home via EMS after witnessed arrest. 5 mins of no CPR prior to EMS arrival. approx 10 mins of CPR before ROSC. Initial rhythm reportedly VF. Initial ECG revealed LBBB. Emergent cath > no CAD noted. Hypothermia protocol initiated in ED. Gastric contents noted in oropharynx upon intubation and food particulate matter suctioned from ETT  Hospital Course:  VF cardiac arrest.  Nonischemic cardiomyopathy.  Acute systolic / diastolic CHF. No hemodynamically significant CAD.  Moderate Mitral regurg. Admitted to the ICU s/p cardiac arrest: Treated w/ hypothermia protocol, cardiology and EP was consulted. Eventually repeat TEE showed improved cardiac output. Initial EF 25% noted on admission echo. Urine drug screen negative, and she denies any drug  use for over a year. EF improved to 51% on cardiac MRI yesterday. Septal wall motion abnormality likely due to LBBB. Suspect EF was down post-arrest due to stunning. Continue Coreg and lisinopril at current doses.  . Cardiology recommendations were to continue current medical therapies. Eventually it is felt she may need ICD, but medical compliance was a large concern. Because of this in the short term they have recommended life vest.  Plan - Coreg, ACE-I  by cardiology.  - home w/ life vest - follow up with cards.    Acute respiratory failure in setting of cardiac arrest. Now resolved. Failed extubation 3/27 largely due to agitation, some question asp pneumonitis (vs cardiac pulm edema). Now on room air. Her primary issue will be compliance at discharge. No need for pulmonary follow up. Does need to stop smoking.   Suspected aspiration, completed 7 days of abx.  P:  - D/c'd Unasyn 3/27 -clinically improved/resolved.   Dysphagia  Initially failed swallowing eval. Had improved significantly from a neuro standpoint and had follow up SLP eval on day of discharge. At this point her swallowing had improved and she was cleared for honey thick liquid diet with swallowing precautions and rec to f/u with one week  Anemia, stable.  P:  - Trend CBC.  - Heparin for DVT Px   Acute Encephalopathy, much improved/ resolved. Think this was multi-factorial: metabolic, infection, medication related.  P:  - Added thiamine, folate 3/31  Deconditioning s/p cardiac arrest: felt that she is not candidate for rehab P: Home w/  home health RN, heart failure RN, PT and OT.   Discharge Exam: BP 110/68  Pulse 93  Temp(Src) 98.8 F (37.1 C) (Oral)  Resp 2  Ht 5\' 4"  (1.626 m)  Wt 50.576 kg (111 lb 8 oz)  BMI 19.13 kg/m2  SpO2 94% Room air  PHYSICAL EXAMINATION:   General: Appears comfortable on RA  Neuro: wide awake, able to talk, non-focal, less confused  HEENT: PERRL, OP dry  Cardiovascular: RRR, no  m/r/g  Lungs: Bilateral diminished air entry, systolic murmur, no wheeze  Abdomen: Soft, nontender, bowel sounds diminished  Musculoskeletal: Moves all extremities, no edema  Skin: Intact  Labs at discharge Lab Results  Component Value Date   CREATININE 0.70 01/30/2013   BUN 29* 01/30/2013   NA 143 01/30/2013   K 3.7 01/30/2013   CL 109 01/30/2013   CO2 22 01/30/2013   Lab Results  Component Value Date   WBC 12.0* 01/30/2013   HGB 9.2* 01/30/2013   HCT 28.3* 01/30/2013   MCV 89.6 01/30/2013   PLT 917* 01/30/2013   Lab Results  Component Value Date   ALT 31 01/27/2013   AST 38* 01/27/2013   ALKPHOS 126* 01/27/2013   BILITOT 0.4 01/27/2013   Lab Results  Component Value Date   INR 1.18 01/27/2013   INR 1.03 01/22/2013   INR 1.11 01/16/2013    Current radiology studies Dg Swallowing Func-speech Pathology  01/30/2013  Berdine Dance, Student-SLP     01/30/2013  3:28 PM Objective Swallowing Evaluation: Modified Barium Swallowing Study   Patient Details  Name: [name] MRN: [id number] Date of Birth: [birth year]  Today's Date: 01/30/2013 Time: 1400-1420 SLP Time Calculation (min): 20 min  Past Medical History:  Past Medical History  Diagnosis Date  . Crack cocaine use   . ETOH abuse   . Anxiety   . Depression   . Pulmonary embolism     a. 04/2009 - treated with coumadin x 8 mos.  . DVT (deep venous thrombosis)     a. 04/2009 - treated with coumadin x 8 mos.  . Rectal bleeding     a. int hemorrhoids by colonoscopy in 04/2006, Bx neg for IBD  . Duodenitis     a. 05/2006 2/2 H pylori +/- NSAIDS  . History of CVA (cerebrovascular accident)   . Tobacco abuse   . Chest pain     a. 04/2009 Echo: EF 55-60%, Gr1 DD, Mild MR.  Marland Kitchen Hypertension   . Heart murmur   . Stroke   . Seizures   . Anemia   . Vitamin B12 deficiency anemia    Past Surgical History: History reviewed. No pertinent past  surgical history. HPI:  52 yo with history of drug, EtOH abuse, cardiomyopathy, admitted  after VF arrest, treated with hypothermia protocol.Pt  was  intubated fro 3/20 to 3/27 and then reintubated on 3/27 until  3/30. She has required BiPAP up to a few hours ago today.  BSE  yesterday with s/s aspiration and MBS recommended.     Assessment / Plan / Recommendation Clinical Impression  Dysphagia Diagnosis: Mild oral phase dysphagia;Moderate  pharyngeal phase dysphagia Clinical impression: Pt presents with a sensory-motor based  oropharyngeal dysphagia characterized by a delay in swallow  initiation most likely due to sensory impairments from prolonged  ventilator dependency. Pt with slightly reduced laryngeal  elevation combined with delay in swallow initiation resulted in  aspiration/penetration of thin and nectar thick liquids with no  attempts to clear with  spontaneous cough and an increase in  vallecualr and pyriform sinus residuals. Supraglottic swallow and  compensartory chin tuck strategy were implemented , however were  ineffective at reducing occurence of aspiration. SLP used mod  verbal cues for patient to clear aspirates with voluntary cough  and effortful swallow which proved mod effective in clearing  aspirates. SLP recommends pt remain on dys-3 diet with honey  thick liquids with f/u MBS scheduled post d/c for possible diet  advancement.     Treatment Recommendation  F/U MBS in ___ days (Comment) (7)    Diet Recommendation Dysphagia 3 (Mechanical Soft);Honey-thick  liquid   Liquid Administration via: Cup;No straw Medication Administration: Whole meds with puree Supervision: Patient able to self feed;Full supervision/cueing  for compensatory strategies Compensations: Slow rate;Small sips/bites;Multiple dry swallows  after each bite/sip;Clear throat intermittently Postural Changes and/or Swallow Maneuvers: Seated upright 90  degrees;Upright 30-60 min after meal    Other  Recommendations Recommended Consults: MBS Oral Care Recommendations: Oral care QID Other Recommendations: Order thickener from pharmacy;Remove water  pitcher;Prohibited food  (jello, ice cream, thin soups)   Follow Up Recommendations  Home health SLP    Frequency and Duration min 2x/week  2 weeks   Pertinent Vitals/Pain None reported    SLP Swallow Goals Patient will utilize recommended strategies during swallow to  increase swallowing safety with: Minimal assistance Swallow Study Goal #2 - Progress: Progressing toward goal   General HPI: 52 yo with history of drug, EtOH abuse,  cardiomyopathy, admitted after VF arrest, treated with  hypothermia protocol.Pt was intubated fro 3/20 to 3/27 and then  reintubated on 3/27 until 3/30. She has required BiPAP up to a  few hours ago today.  BSE yesterday with s/s aspiration and MBS  recommended. Type of Study: Modified Barium Swallowing Study Reason for Referral: Objectively evaluate swallowing function Previous Swallow Assessment: See MBS from 4/1 Diet Prior to this Study: Honey-thick liquids;Dysphagia 3 (soft) Temperature Spikes Noted: No Respiratory Status: Room air History of Recent Intubation: Yes Length of Intubations (days): 11 days Behavior/Cognition: Alert;Cooperative;Pleasant mood Oral Cavity - Dentition: Adequate natural dentition Oral Motor / Sensory Function: Impaired - see Bedside swallow  eval Self-Feeding Abilities: Able to feed self Patient Positioning: Upright in bed Baseline Vocal Quality: Low vocal intensity;Hoarse Volitional Cough: Strong Volitional Swallow: Able to elicit Anatomy: Within functional limits Pharyngeal Secretions: Not observed secondary MBS    Reason for Referral Objectively evaluate swallowing function   Oral Phase Oral Preparation/Oral Phase Oral Phase: Impaired Oral - Honey Oral - Honey Cup: Reduced posterior propulsion;Lingual/palatal  residue;Delayed oral transit Oral - Nectar Oral - Nectar Teaspoon: Reduced posterior  propulsion;Lingual/palatal residue;Delayed oral transit Oral - Nectar Cup: Weak lingual manipulation;Reduced posterior  propulsion;Lingual/palatal residue;Delayed oral transit Oral - Thin  Oral - Thin Teaspoon: Reduced posterior  propulsion;Lingual/palatal residue;Delayed oral transit Oral - Thin Cup: Reduced posterior propulsion;Lingual/palatal  residue;Delayed oral transit Oral - Solids Oral - Puree: Lingual/palatal residue;Reduced posterior  propulsion;Delayed oral transit Oral - Mechanical Soft: Reduced posterior  propulsion;Lingual/palatal residue;Delayed oral transit Oral - Regular: Reduced posterior propulsion;Lingual/palatal  residue;Delayed oral transit Oral - Pill: Not tested   Pharyngeal Phase Pharyngeal Phase Pharyngeal Phase: Impaired Pharyngeal - Pudding Pharyngeal - Pudding Teaspoon: Not tested Pharyngeal - Pudding Cup: Not tested Pharyngeal - Honey Pharyngeal - Honey Teaspoon: Not tested Pharyngeal - Honey Cup: Delayed swallow initiation;Premature  spillage to valleculae;Reduced laryngeal elevation;Reduced tongue  base retraction;Pharyngeal residue - valleculae;Pharyngeal  residue - pyriform sinuses Pharyngeal - Honey Syringe: Not tested Pharyngeal -  Nectar Pharyngeal - Nectar Teaspoon: Delayed swallow  initiation;Premature spillage to valleculae;Reduced tongue base  retraction;Reduced laryngeal elevation;Pharyngeal residue -  pyriform sinuses;Penetration/Aspiration before swallow Penetration/Aspiration details (nectar teaspoon): Material enters  airway, passes BELOW cords without attempt by patient to eject  out (silent aspiration) Pharyngeal - Nectar Cup: Delayed swallow initiation;Premature  spillage to valleculae;Reduced laryngeal elevation;Reduced tongue  base retraction;Penetration/Aspiration before swallow;Reduced  airway/laryngeal closure;Pharyngeal residue - pyriform sinuses Penetration/Aspiration details (nectar cup): Material enters  airway, passes BELOW cords without attempt by patient to eject  out (silent aspiration) Pharyngeal - Nectar Straw: Not tested Pharyngeal - Nectar Syringe: Not tested Pharyngeal - Thin Pharyngeal - Ice Chips: Not tested Pharyngeal - Thin  Teaspoon: Delayed swallow initiation;Premature  spillage to valleculae;Reduced airway/laryngeal closure;Reduced  tongue base retraction;Penetration/Aspiration before swallow Penetration/Aspiration details (thin teaspoon): Material enters  airway, passes BELOW cords without attempt by patient to eject  out (silent aspiration) Pharyngeal - Thin Cup: Delayed swallow initiation;Premature  spillage to valleculae;Reduced tongue base retraction;Reduced  airway/laryngeal closure;Penetration/Aspiration before  swallow;Pharyngeal residue - pyriform sinuses Penetration/Aspiration details (thin cup): Material enters  airway, passes BELOW cords without attempt by patient to eject  out (silent aspiration) Pharyngeal - Thin Straw: Not tested Pharyngeal - Thin Syringe: Not tested Pharyngeal - Solids Pharyngeal - Puree: Delayed swallow initiation;Premature spillage  to valleculae;Pharyngeal residue - valleculae;Reduced tongue base  retraction Pharyngeal - Mechanical Soft: Delayed swallow  initiation;Premature spillage to valleculae;Reduced tongue base  retraction;Pharyngeal residue - valleculae;Pharyngeal residue -  pyriform sinuses Pharyngeal - Regular: Not tested Pharyngeal - Multi-consistency: Not tested Pharyngeal - Pill: Not tested  Cervical Esophageal Phase    GO             Berdine Dance SLP student Berdine Dance 01/30/2013, 3:26 PM     Mr Card Morphology Wo/w Cm  01/29/2013  *RADIOLOGY REPORT*  Clinical Data: Cardiac arrest cardiomyopathy  MR CARDIA MORPHOLOGY WITHOUT AND WITH CONTRAST  GE 1.5 T magnet with dedicated cardiac coil.  FIESTA sequences for function and morphology.  10 minutes after 15 mL Multihance contrast was injected, inversion recovery sequences were done to assess for myocardial delayed enhancement.  EF was calculated at a dedicated workstation.  Contrast: 15mL MULTIHANCE GADOBENATE DIMEGLUMINE 529 MG/ML IV SOLN  Comparison: None.  Findings: Normal left ventricular size and wall thickness.  EF 51% with mild  mid to apical septal hypokinesis.  Normal right ventricular size and systolic function.  Mild left atrial enlargement.  Normal right atrial size.  Mitral regurgitation appears mild but flow sequences to quantify were not done.  No significant aortic regurgitation or stenosis.  On delayed enhancement imaging, there was a very small area of basal inferior subendocardial enhancement.  There was also a very small area of mid inferolateral mid-wall enhancement.  Measurements:  LV EDV 111 mL  LV SV 57 mL  LV EF 51%  IMPRESSION: 1. Normal LV size with mildly decreased systolic function, EF 51%. There was mid to apical septal hypokinesis.  2. Normal RV size and systolic function.  3. Very small areas of delayed enhancement in the basal inferior and mid inferolateral walls.  This is not a coronary disease pattern.  Cannot rule out prior myocarditis or infiltrative disease such as sarcoidosis.   Original Report Authenticated By: [name]     Disposition: Home w/ home health RN, heart failure RN, PT and OT.        Future Appointments Provider Department Dept Phone   02/24/2013 12:00 PM Laurey Morale, MD [facility]  Main Office [facility]) [phone]       Medication List    STOP taking these medications       bacitracin-polymyxin b ointment  Commonly known as:  POLYSPORIN     cloNIDine 0.1 MG tablet  Commonly known as:  CATAPRES      TAKE these medications       aspirin EC 81 MG tablet  Take 81 mg by mouth daily.     carvedilol 6.25 MG tablet  Commonly known as:  COREG  Take 1 tablet (6.25 mg total) by mouth 2 (two) times daily with a meal.     cyanocobalamin 1000 MCG/ML injection  Commonly known as:  (VITAMIN B-12)  Inject 1,000 mcg into the muscle every 30 (thirty) days.     famotidine 20 MG tablet  Commonly known as:  PEPCID  Take 20 mg by mouth 2 (two) times daily.     folic acid 1 MG tablet  Commonly known as:  FOLVITE  Take 1 tablet (1 mg total) by mouth daily.      food thickener Powd  Commonly known as:  THICK IT  To make all liquids  honey thick consistency     lisinopril 5 MG tablet  Commonly known as:  PRINIVIL,ZESTRIL  Take 1 tablet (5 mg total) by mouth daily.     thiamine 100 MG tablet  Take 1 tablet (100 mg total) by mouth daily.       Follow-up Information   Follow up with [name], MD On 02/24/2013. (12 noon )    Contact information:   [address] [place] [address] [phone]       Discharged Condition: good  Physician Statement:   The Patient was personally examined, the discharge assessment and plan has been personally reviewed and I agree with ACNP Babcock's assessment and plan. > 30 minutes of time have been dedicated to discharge assessment, planning and discharge instructions.   Signed: BABCOCK,PETE 01/30/2013, 4:17 PM    I have interviewed and examined the patient and reviewed the database. I have formulated the assessment and plan as reflected in the note above with amendments made by me.    Billy Fischer, MD;  PCCM service; Mobile [phone]

## 2013-01-31 LAB — CBC WITH DIFFERENTIAL/PLATELET
Basophils Absolute: 0.1 10*3/uL (ref 0.0–0.1)
HCT: 29.5 % — ABNORMAL LOW (ref 36.0–46.0)
Lymphocytes Relative: 48 % — ABNORMAL HIGH (ref 12–46)
Lymphs Abs: 5.2 10*3/uL — ABNORMAL HIGH (ref 0.7–4.0)
Monocytes Absolute: 0.7 10*3/uL (ref 0.1–1.0)
Neutro Abs: 4.8 10*3/uL (ref 1.7–7.7)
RBC: 3.32 MIL/uL — ABNORMAL LOW (ref 3.87–5.11)
RDW: 15.2 % (ref 11.5–15.5)
WBC: 11 10*3/uL — ABNORMAL HIGH (ref 4.0–10.5)

## 2013-01-31 NOTE — Progress Notes (Signed)
Speech Language Pathology Dysphagia Treatment Patient Details Name: [name] MRN: [id number] DOB: April 01, 1961 Today's Date: 01/31/2013 Time: 2952-8413 SLP Time Calculation (min): 11 min  Assessment / Plan / Recommendation Clinical Impression  Pt. seen for dysphagia therapy.  Reviewed process for thickening liquids with minimal cues and pt./family understood.  Also reviewed recommendation for outpatient ST to facilitate swallowing and repeat MBS.  Pt. consumed honey thick grape juice without s/s aspiration.  Min verbal reminders and rationale provided for 2 swallows.  COntinue Dys 3 texture and honey thick liquids.    Diet Recommendation  Continue with Current Diet: Dysphagia 3 (mechanical soft);Honey-thick liquid    SLP Plan Continue with current plan of care   Pertinent Vitals/Pain none   Swallowing Goals  SLP Swallowing Goals Patient will utilize recommended strategies during swallow to increase swallowing safety with: Minimal assistance Swallow Study Goal #2 - Progress: Progressing toward goal  General Temperature Spikes Noted: No Respiratory Status: Room air Behavior/Cognition: Alert;Cooperative;Pleasant mood Oral Cavity - Dentition: Adequate natural dentition Patient Positioning: Upright in bed  Oral Cavity - Oral Hygiene Does patient have any of the following "at risk" factors?: Other - dysphagia Brush patient's teeth BID with toothbrush (using toothpaste with fluoride): Yes Patient is AT RISK - Oral Care Protocol followed (see row info): Yes   Dysphagia Treatment Treatment focused on: Skilled observation of diet tolerance;Utilization of compensatory strategies;Patient/family/caregiver Dealer Educated: daughter [name] Treatment Methods/Modalities: Skilled observation Patient observed directly with PO's: Yes Type of PO's observed: Honey-thick liquids Feeding: Able to feed self Liquids provided via: Cup Type of cueing: Verbal Amount of cueing:  Minimal   GO     Royce Macadamia M.Ed [employer] [phone]  01/31/2013

## 2013-01-31 NOTE — Progress Notes (Signed)
Patient's telemetry has been discontinued and Tresa Endo from [employer] has fitted patient and educated patient and patient's daughter, patient is being discharged home with daughter.  Lorretta Harp RN

## 2013-01-31 NOTE — Progress Notes (Signed)
Patient was d/c home today with a Life Vest with her daughter. CIR declined patient.  RNCM completed d/c plan. CSW signing off.  Lorri Frederick. [place]  [phone]

## 2013-01-31 NOTE — Discharge Summary (Signed)
Physician Discharge Summary  [name] MRN: [id number] DOB/AGE: 03-May-1961 52 y.o.  Admit date: 01/16/2013 Discharge date: 01/31/2013    Discharge Diagnoses:  Principal Problem:   Cardiac arrest Active Problems:   Ventricular fibrillation   Acute respiratory failure with hypoxia   Pulmonary edema   Aspiration into respiratory tract   Hypertension   Polysubstance abuse   Alcohol abuse   Smoker   Hypokalemia   Left bundle branch block   Thrombocytopenia, mild   Hyperglycemia, no hx of DM   Anoxic encephalopathy   Mitral regurgitation   Nonischemic cardiomyopathy   Cardiomyopathy    Hospital Summary: [name] is a 52 y.o. y/o female with a PMH of         DISCHARGE INSTRUCTIONS BY DIAGNOSIS      Discharge Instructions:   CONSULTS  TUBES / LINES   MICRO DATA    ANTIBIOTICS   SIGNIFICANT DIAGNOSTIC STUDIES    Discharge Exam: General: Neuro: CV:  PULM: GI: Extremities:   Filed Vitals:   01/30/13 1419 01/30/13 2043 01/31/13 0617 01/31/13 0953  BP:  117/75 115/64 94/64  Pulse: 93 80 86 75  Temp:  98.7 F (37.1 C) 98.7 F (37.1 C) 98.4 F (36.9 C)  TempSrc:  Oral Oral Oral  Resp:  16 16   Height:      Weight:   112 lb 6.4 oz (50.984 kg)   SpO2: 94% 100% 98% 100%     Discharge Labs  BMET  Recent Labs Lab 01/25/13 0440 01/26/13 0430 01/27/13 0400 01/28/13 0442 01/29/13 0510 01/30/13 0530  NA 140 142 145 150* 148* 143  K 3.8 4.0 3.6 3.3* 3.6 3.7  CL 104 102 105 111 111 109  CO2 25 22 25 24 23 22   GLUCOSE 140* 118* 111* 96 97 88  BUN 26* 34* 34* 36* 34* 29*  CREATININE 0.64 0.76 0.71 0.58 0.65 0.70  CALCIUM 9.4 9.9 10.0 10.1 9.9 9.6  MG 2.7* 3.0* 3.0* 3.0*  --  2.8*  PHOS 4.8* 5.4* 5.7* 5.1*  --  5.3*    CBC  Recent Labs Lab 01/29/13 0510 01/30/13 0530 01/31/13 0611  HGB 10.1* 9.2* 9.7*  HCT 30.9* 28.3* 29.5*  WBC 16.3* 12.0* 11.0*  PLT 951* 917* 820*    Anti-Coagulation  Recent  Labs Lab 01/27/13 0400  INR 1.18      Discharge Orders   Future Appointments Provider Department Dept Phone   02/24/2013 12:00 PM Laurey Morale, MD [facility] Main Office [place]) [phone]   Future Orders Complete By Expires     (HEART FAILURE PATIENTS) Call MD:  Anytime you have any of the following symptoms: 1) 3 pound weight gain in 24 hours or 5 pounds in 1 week 2) shortness of breath, with or without a dry hacking cough 3) swelling in the hands, feet or stomach 4) if you have to sleep on extra pillows at night in order to breathe.  As directed     Call MD for:  severe uncontrolled pain  As directed     Diet - low sodium heart healthy  As directed     Discharge instructions  As directed     Comments:      Continue swallow restrictions as recommended  Honey thick liquids    Increase activity slowly  As directed         Follow-up Information   Follow up with [name], MD On 02/24/2013. (12 noon )  Contact information:   [address] [place] [address] [phone]       Follow up with see your primary doctor . (2-3 weeks )          Medication List    STOP taking these medications       bacitracin-polymyxin b ointment  Commonly known as:  POLYSPORIN     cloNIDine 0.1 MG tablet  Commonly known as:  CATAPRES      TAKE these medications       aspirin EC 81 MG tablet  Take 81 mg by mouth daily.     carvedilol 6.25 MG tablet  Commonly known as:  COREG  Take 1 tablet (6.25 mg total) by mouth 2 (two) times daily with a meal.     cyanocobalamin 1000 MCG/ML injection  Commonly known as:  (VITAMIN B-12)  Inject 1,000 mcg into the muscle every 30 (thirty) days.     famotidine 20 MG tablet  Commonly known as:  PEPCID  Take 20 mg by mouth 2 (two) times daily.     folic acid 1 MG tablet  Commonly known as:  FOLVITE  Take 1 tablet (1 mg total) by mouth daily.     food thickener Powd  Commonly known  as:  THICK IT  To make all liquids  honey thick consistency     lisinopril 5 MG tablet  Commonly known as:  PRINIVIL,ZESTRIL  Take 1 tablet (5 mg total) by mouth daily.     RESOURCE THICKENUP CLEAR Powd  Honey thick     thiamine 100 MG tablet  Take 1 tablet (100 mg total) by mouth daily.          Disposition:   Discharged Condition: [name] has met maximum benefit of inpatient care and is medically stable and cleared for discharge.  Patient is pending follow up as above.      Time spent on disposition:  Greater than 35 minutes.   Signed: Canary Brim, NP-C [facility] Pgr: [phone]    Patient seen and examined, agree with above note.  I dictated the care and orders written for this patient under my direction.  Alyson Reedy, MD [phone]

## 2013-01-31 NOTE — Progress Notes (Signed)
Physical Therapy Treatment Patient Details Name: [name] MRN: [id number] DOB: [birth year] Today's Date: 01/31/2013 Time: 4098-1191 PT Time Calculation (min): 30 min  PT Assessment / Plan / Recommendation Comments on Treatment Session  52 y/o with history of drug abuse admitted after VF arrest treated with hypothermia protocol. Great progress since I last saw her. Ambulating with higher level balance deficits noted. Attempted ambulation with cane but patient did not appear to like it and it was almost more of a hinderance than a help. Patient aware to have family supervise her with mobility. Patient will benefit from OPPT therapy for balance training. Education provided to her about this and RN reports this has already been set up. She denied needing to practice stairs today.    Follow Up Recommendations  Outpatient PT;Supervision for mobility/OOB     Does the patient have the potential to tolerate intense rehabilitation     Barriers to Discharge        Equipment Recommendations  None recommended by PT    Recommendations for Other Services    Frequency Min 3X/week   Plan Discharge plan needs to be updated;Frequency remains appropriate    Precautions / Restrictions Precautions Precautions: Fall Restrictions Weight Bearing Restrictions: No   Pertinent Vitals/Pain Denies pain    Mobility  Bed Mobility Bed Mobility: Not assessed Transfers Transfers: Sit to Stand;Stand to Sit Sit to Stand: With upper extremity assist;From bed;6: Modified independent (Device/Increase time) Stand to Sit: With upper extremity assist;To bed;6: Modified independent (Device/Increase time) Ambulation/Gait Ambulation/Gait Assistance: 4: Min guard;4: Min assist Ambulation Distance (Feet): 400 Feet Assistive device: Straight cane Ambulation/Gait Assistance Details: mingaurdA for ambulation without AD, increased lateral sway even with slower cautious speed, when distracted she tends to cross midline  to correct imbalance; attempt ambulation with SPC, pt needing minA hand over hand cueing for sequencing of cane and stepping, pt did not like the cane and it was almost more of a falls hazard than a help as she had difficulty sequencing regardless of cueing Gait Pattern: Step-through pattern;Decreased stride length;Narrow base of support      PT Goals Acute Rehab PT Goals PT Goal: Sit to Stand - Progress: Partly met PT Goal: Stand to Sit - Progress: Partly met PT Goal: Ambulate - Progress: Progressing toward goal  Visit Information  Last PT Received On: 01/31/13 Assistance Needed: +1    Subjective Data  Subjective: Im ready to go home.    Cognition  Cognition Overall Cognitive Status: Appears within functional limits for tasks assessed/performed Arousal/Alertness: Awake/alert Orientation Level: Appears intact for tasks assessed Behavior During Session: [facility] for tasks performed    Balance  High Level Balance High Level Balance Activites: Direction changes;Turns;Head turns (vertical and horizontal head turns) High Level Balance Comments: mingaurdA for all dynamic challenges, no major LOB requring assist to correct however pt needing to slow down to perform safely  End of Session PT - End of Session Equipment Utilized During Treatment: Gait belt Activity Tolerance: Patient tolerated treatment well Patient left: in bed;with call bell/phone within reach Nurse Communication: Mobility status   GP     [facility] HELEN 01/31/2013, 8:56 AM

## 2013-01-31 NOTE — Progress Notes (Signed)
[name], female   DOB: 06-07-1961, 52 y.o.   MRN: [id number]    SUBJECTIVE: Doing well this morning.  No complaints.   . carvedilol  6.25 mg Oral BID WC  . feeding supplement  237 mL Oral BID BM  . folic acid  1 mg Oral Daily  . heparin  5,000 Units Subcutaneous Q8H  . lisinopril  5 mg Oral Daily  . pantoprazole  40 mg Oral Daily  . thiamine  100 mg Oral Daily    Filed Vitals:   01/30/13 1355 01/30/13 1419 01/30/13 2043 01/31/13 0617  BP: 110/68  117/75 115/64  Pulse: 89 93 80 86  Temp: 98.8 F (37.1 C)  98.7 F (37.1 C) 98.7 F (37.1 C)  TempSrc: Oral  Oral Oral  Resp: 2  16 16   Height:      Weight:    112 lb 6.4 oz (50.984 kg)  SpO2: 98% 94% 100% 98%    Intake/Output Summary (Last 24 hours) at 01/31/13 0815 Last data filed at 01/30/13 0900  Gross per 24 hour  Intake    240 ml  Output      0 ml  Net    240 ml    LABS: Basic Metabolic Panel:  Recent Labs  40/98/11 0510 01/30/13 0530  NA 148* 143  K 3.6 3.7  CL 111 109  CO2 23 22  GLUCOSE 97 88  BUN 34* 29*  CREATININE 0.65 0.70  CALCIUM 9.9 9.6  MG  --  2.8*  PHOS  --  5.3*   Liver Function Tests: No results found for this basename: AST, ALT, ALKPHOS, BILITOT, PROT, ALBUMIN,  in the last 72 hours No results found for this basename: LIPASE, AMYLASE,  in the last 72 hours CBC:  Recent Labs  01/30/13 0530 01/31/13 0611  WBC 12.0* 11.0*  NEUTROABS 6.5 4.8  HGB 9.2* 9.7*  HCT 28.3* 29.5*  MCV 89.6 88.9  PLT 917* 820*   PHYSICAL EXAM General: awake/alert, NAD Neck: No JVD, no thyromegaly or thyroid nodule.  Lungs: Dependent crackles CV: Nondisplaced PMI.  Heart regular S1/S2, no S3/S4, 1/6 HSM apex.  No peripheral edema.  Abdomen: Soft, no hepatosplenomegaly, no distention.  Extremities: No clubbing or cyanosis.   TELEMETRY: Reviewed telemetry pt in NSR  ASSESSMENT AND PLAN: 52 yo with history of substance abuse had out of hospital arrest with defibrillation in the field.   Cath without significant CAD.   1. CHF: Acute on chronic systolic CHF, EF 91% noted on admission echo. Urine drug screen negative, and she denies any drug use for over a year. EF improved to 51% on cardiac MRI Wednesday. Septal wall motion abnormality likely due to LBBB.  Suspect EF was down post-arrest due to stunning. Continue Coreg and lisinopril at current doses.  2. Mitral regurgitation: Murmur less prominent, suspect improvement with improvement in EF.  Will get echo in future to reassess.  3. Cardiac arrest: ? Cause.  No CAD.  It appears initial low EF may have been related to stunning in the setting of cardiac arrest.  UDS negative.  K was low initially, so may have been related to hypokalemia, ?also consider withdrawing from ETOH.  A bit of a quandary for ICD (secondary prevention).  Agree with Life Vest and followup for compliance => should get Life vest this morning prior to discharge.  4. Pulmonary: Possible aspiration pneumonitis, resolved. 5. I will see her in 2 wks in the office.  [name] 01/31/2013 8:15 AM

## 2013-02-03 ENCOUNTER — Encounter: Payer: Self-pay | Admitting: Internal Medicine

## 2013-02-05 ENCOUNTER — Ambulatory Visit: Payer: 59

## 2013-02-05 ENCOUNTER — Ambulatory Visit: Payer: Self-pay | Attending: Pulmonary Disease | Admitting: Speech Pathology

## 2013-02-05 ENCOUNTER — Ambulatory Visit: Payer: MEDICAID | Admitting: Speech Pathology

## 2013-02-05 DIAGNOSIS — R1313 Dysphagia, pharyngeal phase: Secondary | ICD-10-CM | POA: Insufficient documentation

## 2013-02-05 DIAGNOSIS — IMO0001 Reserved for inherently not codable concepts without codable children: Secondary | ICD-10-CM | POA: Insufficient documentation

## 2013-02-11 ENCOUNTER — Ambulatory Visit: Payer: Self-pay

## 2013-02-13 ENCOUNTER — Ambulatory Visit: Payer: Self-pay | Admitting: Speech Pathology

## 2013-02-17 ENCOUNTER — Ambulatory Visit: Payer: Self-pay | Admitting: Speech Pathology

## 2013-02-19 ENCOUNTER — Ambulatory Visit: Payer: Self-pay | Admitting: Speech Pathology

## 2013-02-21 ENCOUNTER — Encounter: Payer: Self-pay | Admitting: *Deleted

## 2013-02-24 ENCOUNTER — Encounter: Payer: Self-pay | Admitting: Cardiology

## 2013-02-24 ENCOUNTER — Ambulatory Visit (INDEPENDENT_AMBULATORY_CARE_PROVIDER_SITE_OTHER): Payer: Self-pay | Admitting: Cardiology

## 2013-02-24 VITALS — BP 116/62 | HR 76 | Ht 64.0 in | Wt 133.0 lb

## 2013-02-24 DIAGNOSIS — I469 Cardiac arrest, cause unspecified: Secondary | ICD-10-CM

## 2013-02-24 DIAGNOSIS — I447 Left bundle-branch block, unspecified: Secondary | ICD-10-CM

## 2013-02-24 DIAGNOSIS — I499 Cardiac arrhythmia, unspecified: Secondary | ICD-10-CM

## 2013-02-24 DIAGNOSIS — F1910 Other psychoactive substance abuse, uncomplicated: Secondary | ICD-10-CM

## 2013-02-24 DIAGNOSIS — I10 Essential (primary) hypertension: Secondary | ICD-10-CM

## 2013-02-24 DIAGNOSIS — I4901 Ventricular fibrillation: Secondary | ICD-10-CM

## 2013-02-24 DIAGNOSIS — F1010 Alcohol abuse, uncomplicated: Secondary | ICD-10-CM

## 2013-02-24 DIAGNOSIS — I429 Cardiomyopathy, unspecified: Secondary | ICD-10-CM

## 2013-02-24 DIAGNOSIS — I428 Other cardiomyopathies: Secondary | ICD-10-CM

## 2013-02-24 NOTE — Patient Instructions (Addendum)
Your physician has requested that you have an echocardiogram. Echocardiography is a painless test that uses sound waves to create images of your heart. It provides your doctor with information about the size and shape of your heart and how well your heart's chambers and valves are working. This procedure takes approximately one hour. There are no restrictions for this procedure. June 2014  Your physician recommends that you schedule a follow-up appointment with Dr Shirlee Latch  after the echocardiogram in June.

## 2013-02-24 NOTE — Progress Notes (Signed)
[name], female   DOB: 07/08/1961, 52 y.o.   MRN: [id number] PCP: Dr. Dorthula Rue  52 yo with history of ETOH abuse and prior substance abuse was admitted to [facility] in 3/14 after a cardiac arrest.  She was found down and underwent defibrillation in the field by EMS.  In the hospital, she remained in NSR.  She underwent hypothermia protocol.  LHC showed no CAD.  Initial echo showed EF 25% with moderate MR.  She recovered and was extubated.  Cardiac MRI done prior to discharge showed recovery of EF to 51% with small areas of basal inferior and mid inferolateral delayed enhancement.  Initially, she was hypokalemic.  She also reported having quit drinking a couple of days before the cardiac arrest.  She had been a heavy drinker prior.  She is now home, wearing a Lifevest.  She is no longer drinking ETOH or smoking cigarettes.  She feels good overall.  She is fatigued and short of breath after walking 8 blocks (distance from her house to store).  No orthopnea, no PND, no chest pain, no lightheadedness, no tachypalpitations.    Labs (4/14): K 3.7, creatinine 0.7, HCT 29.5  ECG: NSR, LBBB   PMH: 1. B12 deficiency 2. ETOH abuse 3. LBBB 4. Prior drug abuse 5. Cardiac arrest: 3/14.  Defibrillated in the field.  Hypothermia protocol.  Initial echo with EF 25%, moderate eccentric MR.  cMRI done a week or so later showed EF 51%, mid to apical septal hypokinesis, normal RV, small area of delayed enhancement in the basal inferior and mid inferolateral walls, mild MR.  LHC showed no CAD.    SH: Prior ETOH abuse, now abstinent.  Prior drug abuse, now abstinent.  Prior smoking.   FH: No CAD, no cardiomyopathy, no cardiac arrest.   Current Outpatient Prescriptions  Medication Sig Dispense Refill  . aspirin EC 81 MG tablet Take 81 mg by mouth daily.      . bacitracin-polymyxin b (POLYSPORIN) ointment Apply topically 2 (two) times daily.  15 g  0  . carvedilol (COREG) 6.25 MG tablet Take 1  tablet (6.25 mg total) by mouth 2 (two) times daily with a meal.  60 tablet  6  . cyanocobalamin (,VITAMIN B-12,) 1000 MCG/ML injection Inject 1 mL (1,000 mcg total) into the muscle every 30 (thirty) days.  1 mL  0  . famotidine (PEPCID) 20 MG tablet Take 1 tablet (20 mg total) by mouth 2 (two) times daily.  60 tablet  6  . folic acid (FOLVITE) 1 MG tablet Take 1 tablet (1 mg total) by mouth daily.  30 tablet  6  . food thickener (THICK IT) POWD To make all liquids  honey thick consistency  1 Can  12  . lisinopril (PRINIVIL,ZESTRIL) 5 MG tablet Take 1 tablet (5 mg total) by mouth daily.  30 tablet  6  . Maltodextrin-Xanthan Gum (RESOURCE THICKENUP CLEAR) POWD Honey thick  1 Can  12  . thiamine 100 MG tablet Take 1 tablet (100 mg total) by mouth daily.  30 tablet  6   No current facility-administered medications for this visit.    BP 116/62  Pulse 76  Ht 5\' 4"  (1.626 m)  Wt 133 lb (60.328 kg)  BMI 22.82 kg/m2 General: NAD Neck: No JVD, no thyromegaly or thyroid nodule.  Lungs: Clear to auscultation bilaterally with normal respiratory effort. CV: Nondisplaced PMI.  Heart regular S1/S2, no S3/S4, no murmur.  No peripheral edema.  No  carotid bruit.  Normal pedal pulses.  Abdomen: Soft, nontender, no hepatosplenomegaly, no distention.   Neurologic: Alert and oriented x 3.  Psych: Normal affect. Extremities: No clubbing or cyanosis.   Assessment/Plan: 1. Cardiac arrest: Patient has had a full recovery.  Cause of initial arrest is somewhat uncertain.  No CAD.  Initial EF was 25% by echo but she had recovery of EF to 51% by cardiac MRI by the time of discharge, so the initial low EF may have been related to myocardial stunning from the arrest.  She was hypokalemic when she first arrived to the ER.  Also, talking to her later on, it sounds like she had quit drinking 2-3 days prior to the arrest.  She was a heavy drinker before this.  It is possible that the arrest could be related to an adrenergic  surge from ETOH withdrawal.  She had to be treated for withdrawal prior to extubation.  An ICD was not placed while in the hospital, but she is wearing a Lifevest.  No discharge of the Lifevest.  I will have her wear the Lifevest until 6/14 when I will repeat an echo (3 months post-event).  If EF remains reasonably preserved and no further events, I will likely have her take off the vest and will hold off on ICD.  2. ETOH abuse: She has quit drinking completely.  I congratulated her on this.  3. Cardiomyopathy: EF recovered to 51% prior to discharge from the hospital.  She will continue current doses of Coreg and lisinopril.  As above, will repeat echo in 6/14.    Followup after echo.   [name] 02/24/2013

## 2013-02-25 ENCOUNTER — Ambulatory Visit: Payer: Self-pay

## 2013-02-25 NOTE — Addendum Note (Signed)
Addended by: Micki Riley C on: 02/25/2013 03:49 PM   Modules accepted: Orders

## 2013-02-27 ENCOUNTER — Ambulatory Visit: Payer: Self-pay | Attending: Pulmonary Disease

## 2013-02-27 DIAGNOSIS — IMO0001 Reserved for inherently not codable concepts without codable children: Secondary | ICD-10-CM | POA: Insufficient documentation

## 2013-02-27 DIAGNOSIS — R1310 Dysphagia, unspecified: Secondary | ICD-10-CM | POA: Insufficient documentation

## 2013-02-27 DEATH — deceased

## 2013-02-28 ENCOUNTER — Ambulatory Visit (INDEPENDENT_AMBULATORY_CARE_PROVIDER_SITE_OTHER): Payer: Self-pay | Admitting: Internal Medicine

## 2013-02-28 ENCOUNTER — Encounter: Payer: Self-pay | Admitting: Internal Medicine

## 2013-02-28 VITALS — BP 116/69 | HR 83 | Temp 97.4°F | Ht 64.0 in | Wt 135.2 lb

## 2013-02-28 DIAGNOSIS — I252 Old myocardial infarction: Secondary | ICD-10-CM

## 2013-02-28 DIAGNOSIS — K219 Gastro-esophageal reflux disease without esophagitis: Secondary | ICD-10-CM | POA: Insufficient documentation

## 2013-02-28 DIAGNOSIS — D518 Other vitamin B12 deficiency anemias: Secondary | ICD-10-CM

## 2013-02-28 DIAGNOSIS — F1910 Other psychoactive substance abuse, uncomplicated: Secondary | ICD-10-CM

## 2013-02-28 DIAGNOSIS — I469 Cardiac arrest, cause unspecified: Secondary | ICD-10-CM

## 2013-02-28 DIAGNOSIS — J309 Allergic rhinitis, unspecified: Secondary | ICD-10-CM

## 2013-02-28 DIAGNOSIS — J302 Other seasonal allergic rhinitis: Secondary | ICD-10-CM

## 2013-02-28 DIAGNOSIS — Z889 Allergy status to unspecified drugs, medicaments and biological substances status: Secondary | ICD-10-CM | POA: Insufficient documentation

## 2013-02-28 DIAGNOSIS — I10 Essential (primary) hypertension: Secondary | ICD-10-CM

## 2013-02-28 MED ORDER — LORATADINE 10 MG PO TABS: 10.0000 mg | ORAL_TABLET | Freq: Every day | ORAL | Status: DC

## 2013-02-28 MED ORDER — CYANOCOBALAMIN 1000 MCG/ML IJ SOLN
1000.0000 ug | Freq: Once | INTRAMUSCULAR | Status: AC
  Administered 2013-02-28: 1000 ug via INTRAMUSCULAR

## 2013-02-28 MED ORDER — FAMOTIDINE 20 MG PO TABS: 20.0000 mg | ORAL_TABLET | Freq: Two times a day (BID) | ORAL | Status: DC

## 2013-02-28 MED ORDER — LORATADINE 10 MG PO TBDP: 10.0000 mg | ORAL_TABLET | Freq: Every day | ORAL | Status: DC

## 2013-02-28 NOTE — Assessment & Plan Note (Signed)
Status post out of hospital V. fib cardiac arrest. Clean coronaries. Great recovery.  Closely followed by cardiology.

## 2013-02-28 NOTE — Assessment & Plan Note (Signed)
Refill famotidine

## 2013-02-28 NOTE — Assessment & Plan Note (Signed)
Refill Claritin.

## 2013-02-28 NOTE — Assessment & Plan Note (Signed)
BP Readings from Last 3 Encounters:  02/28/13 116/69  02/24/13 116/62  01/31/13 94/64    Lab Results  Component Value Date   NA 143 01/30/2013   K 3.7 01/30/2013   CREATININE 0.70 01/30/2013    Assessment: Blood pressure control:   at goal Progress toward BP goal:    stable Comments: No new changes.  Plan: Medications:  continue current medications, Continue Coreg and lisinopril. Educational resources provided: brochure Self management tools provided: home blood pressure logbook Other plans: Followup with cardiology in mid June with 2-D echo.

## 2013-02-28 NOTE — Progress Notes (Signed)
  Subjective:    [name] Born, female    DOB: 20-Dec-1960, 52 y.o.   MRN: [id number]  HPI patient is a pleasant 52 year old woman with hypertension, recent cardiac arrest who comes to the clinic for a hospital followup after cardiac arrest.  She was admitted in March 2014 status post out of hospital V. fib arrest. Hypothermia protocol was initiated and she got left heart catheter which showed clean coronaries. Her initial EF of 25% which improved to 51% before discharge. She was discharged with life vest and saw Dr. Shirlee Latch with cardiology- who is her back in mid June and repeat 2-D echo and make decision for removing life vest at that time.  She denies any symptoms of chest pain, short of breath, headache, palpitations, dizziness, vision changes since hospital discharge. She denies any nausea vomiting or abdominal pain, diarrhea, weakness numbness. She reports improved in her memory and is following with speech therapy.   she needs refill on famotidine and Claritin.  Review of Systems     As per history of present illness.  Objective:   Physical Exam  General: NAD. HEENT: PERRL, EOMI, no scleral icterus Cardiac: S1, S2, RRR, no rubs, systolic murmur. Pulm: clear to auscultation bilaterally, moving normal volumes of air Abd: soft, nontender, nondistended, BS present Ext: warm and well perfused, no pedal edema Neuro: alert and oriented X3, cranial nerves II-XII grossly intact       Assessment & Plan:

## 2013-02-28 NOTE — Assessment & Plan Note (Signed)
Vitamin B 12 IM monthly shot today.

## 2013-02-28 NOTE — Assessment & Plan Note (Signed)
Patient stopped smoking cigarettes and drinking alcohol. Congratulated her for that.

## 2013-02-28 NOTE — Patient Instructions (Addendum)
Please make followup appointment in 3-4 months or earlier if needed.  Followup with Dr. Shirlee Latch in June as scheduled.   Keep taking all medications regularly as you do.  Refills for Claritin and famotidine were sent to [facility].

## 2013-02-28 NOTE — Addendum Note (Signed)
Addended by: Angelina Ok F on: 02/28/2013 05:27 PM   Modules accepted: Orders

## 2013-03-03 ENCOUNTER — Ambulatory Visit: Payer: Self-pay | Admitting: Speech Pathology

## 2013-03-05 ENCOUNTER — Ambulatory Visit: Payer: Self-pay | Admitting: Speech Pathology

## 2013-03-06 NOTE — Progress Notes (Signed)
Case discussed with Dr. Patel immediately after the resident saw the patient.  We reviewed the resident's history and exam and pertinent patient test results.  I agree with the assessment, diagnosis and plan of care documented in the resident's note. 

## 2013-03-07 ENCOUNTER — Other Ambulatory Visit (HOSPITAL_COMMUNITY): Payer: Self-pay | Admitting: Pulmonary Disease

## 2013-03-07 DIAGNOSIS — R1310 Dysphagia, unspecified: Secondary | ICD-10-CM

## 2013-03-11 ENCOUNTER — Ambulatory Visit (HOSPITAL_COMMUNITY)
Admission: RE | Admit: 2013-03-11 | Discharge: 2013-03-11 | Disposition: A | Discharge status: Home / Self Care | Payer: Self-pay | Source: Ambulatory Visit | Attending: Pulmonary Disease | Admitting: Pulmonary Disease

## 2013-03-11 DIAGNOSIS — R1310 Dysphagia, unspecified: Secondary | ICD-10-CM | POA: Insufficient documentation

## 2013-03-11 DIAGNOSIS — R1313 Dysphagia, pharyngeal phase: Secondary | ICD-10-CM | POA: Insufficient documentation

## 2013-03-11 NOTE — Procedures (Signed)
Objective Swallowing Evaluation: Modified Barium Swallowing Study  Patient Details  Name: [name] MRN: [id number] Date of Birth: [birth year]  Today's Date: 03/11/2013 Time: 1191-4782 SLP Time Calculation (min): 25 min  Past Medical History:  Past Medical History  Diagnosis Date  . Rectal bleeding 2007    internal hemmorhoids by colonoscopy in 04/2006, Bx neg for IBD  . Duodenitis     secondary to H pylori(treatment completed 05/2006), +/- NSAIDS  . Alcoholism   . History of cocaine abuse   . PE 05/16/2009    CT angio positive for PE in 07/10, treated with coumadin for 8 months.   . Crack cocaine use   . ETOH abuse   . Anxiety   . Depression   . Pulmonary embolism     a. 04/2009 - treated with coumadin x 8 mos.  . DVT (deep venous thrombosis)     a. 04/2009 - treated with coumadin x 8 mos.  . Rectal bleeding     a. int hemorrhoids by colonoscopy in 04/2006, Bx neg for IBD  . Duodenitis     a. 05/2006 2/2 H pylori +/- NSAIDS  . History of CVA (cerebrovascular accident)   . Tobacco abuse   . Chest pain     a. 04/2009 Echo: EF 55-60%, Gr1 DD, Mild MR.  Marland Kitchen Hypertension   . Heart murmur   . Stroke   . Seizures   . Anemia   . Vitamin B12 deficiency anemia    Past Surgical History:  Past Surgical History  Procedure Laterality Date  . Cesarean section    . Cardiac catheterization     HPI:  52 yo with history of drug, EtOH abuse, cardiomyopathy, admitted after VF arrest, treated with hypothermia protocol in March, 2014.  Pt was intubated fro 3/20 to 3/27 and then reintubated on 3/27 until 3/30. The pt underwent an MBS on 4/314 and was recommended to consume honey thick liquids and puree due to silent aspiration of thin and honey. Pt returns today for an outpatient MBS.      Assessment / Plan / Recommendation Clinical Impression  Dysphagia Diagnosis: Mild pharyngeal phase dysphagia Clinical impression: Overally pt demonstrates adquate swallow function. With her initial sip  of thin liquids pt did have a delay in initiation with sensed aspiration with ineffective expectoration. However, in all the rest of trials, pts swallow was [facility] with no penetration or aspiration. She demonstrated an ability to consume solids without difficutly. Suspect that pt was not fully prepared with initial sip and this is an isolated event. Overall, aspiration risk is low. Pt is recommended to consume thin liquids and regular solids. SLP provded aspiration precautions, particulary attending to meals, reducing distractions. Pt should f/u with outpatient SLP for further treatment as needed.     Treatment Recommendation  Defer treatment plan to SLP at (Comment) (outpatient)    Diet Recommendation Regular;Thin liquid   Liquid Administration via: Cup;Straw Medication Administration: Whole meds with liquid Supervision: Patient able to self feed Compensations: Slow rate;Small sips/bites Postural Changes and/or Swallow Maneuvers: Seated upright 90 degrees    Other  Recommendations Oral Care Recommendations: Patient independent with oral care   Follow Up Recommendations  Outpatient SLP    Frequency and Duration        Pertinent Vitals/Pain NA    SLP Swallow Goals     General HPI: 52 yo with history of drug, EtOH abuse, cardiomyopathy, admitted after VF arrest, treated with hypothermia protocol in March, 2014.  Pt was intubated fro 3/20 to 3/27 and then reintubated on 3/27 until 3/30. The pt underwent an MBS on 4/314 and was recommended to consume honey thick liquids and puree due to silent aspiration of thin and honey. Pt returns today for an outpatient MBS.  Type of Study: Modified Barium Swallowing Study Reason for Referral: Objectively evaluate swallowing function Previous Swallow Assessment: see HPI Diet Prior to this Study: Dysphagia 3 (soft);Honey-thick liquids Temperature Spikes Noted: N/A Respiratory Status: Room air History of Recent Intubation: No Behavior/Cognition:  Alert;Cooperative;Pleasant mood Oral Cavity - Dentition: Adequate natural dentition Oral Motor / Sensory Function: Within functional limits Self-Feeding Abilities: Able to feed self Patient Positioning: Upright in chair Baseline Vocal Quality: Clear Volitional Cough: Strong Volitional Swallow: Able to elicit Anatomy: Within functional limits Pharyngeal Secretions: Not observed secondary MBS    Reason for Referral Objectively evaluate swallowing function   Oral Phase Oral Preparation/Oral Phase Oral Phase: WFL   Pharyngeal Phase Pharyngeal Phase Pharyngeal Phase: Impaired Pharyngeal - Thin Pharyngeal - Thin Cup: Delayed swallow initiation;Moderate aspiration;Penetration/Aspiration before swallow Penetration/Aspiration details (thin cup): Material enters airway, passes BELOW cords and not ejected out despite cough attempt by patient;Material does not enter airway Pharyngeal - Thin Straw: Within functional limits Pharyngeal - Solids Pharyngeal - Regular: Within functional limits Pharyngeal - Pill: Within functional limits  Cervical Esophageal Phase    GO         Functional Assessment Tool Used: clinical judgement Functional Limitations: Swallowing Swallow Current Status (M8413): 0 percent impaired, limited or restricted Swallow Goal Status (K4401): 0 percent impaired, limited or restricted Swallow Discharge Status [phone]): 0 percent impaired, limited or restricted   [facility], MA CCC-SLP [phone]  Claudine Mouton 03/11/2013, 1:35 PM

## 2013-04-16 ENCOUNTER — Ambulatory Visit (HOSPITAL_COMMUNITY): Payer: Self-pay | Attending: Cardiology | Admitting: Radiology

## 2013-04-16 DIAGNOSIS — I469 Cardiac arrest, cause unspecified: Secondary | ICD-10-CM

## 2013-04-16 DIAGNOSIS — Z8674 Personal history of sudden cardiac arrest: Secondary | ICD-10-CM | POA: Insufficient documentation

## 2013-04-16 NOTE — Progress Notes (Signed)
Limited Echocardiogram performed.  

## 2013-04-28 ENCOUNTER — Telehealth: Payer: Self-pay | Admitting: Cardiology

## 2013-04-28 ENCOUNTER — Encounter: Payer: Self-pay | Admitting: *Deleted

## 2013-04-28 NOTE — Telephone Encounter (Signed)
LM for [name] that I did receive the form for life vest and that pt has appt with Dr Shirlee Latch 04/30/13. I will ask Dr Shirlee Latch to complete form after pt's appt 04/30/13.

## 2013-04-28 NOTE — Telephone Encounter (Signed)
New Prob    Wanting to know if referral was received for a life vest. Please call.

## 2013-04-30 ENCOUNTER — Encounter: Payer: Self-pay | Admitting: Cardiology

## 2013-04-30 ENCOUNTER — Ambulatory Visit (INDEPENDENT_AMBULATORY_CARE_PROVIDER_SITE_OTHER): Payer: Self-pay | Admitting: Cardiology

## 2013-04-30 VITALS — BP 110/70 | HR 88 | Wt 144.0 lb

## 2013-04-30 DIAGNOSIS — I428 Other cardiomyopathies: Secondary | ICD-10-CM

## 2013-04-30 DIAGNOSIS — I4901 Ventricular fibrillation: Secondary | ICD-10-CM

## 2013-04-30 DIAGNOSIS — F1010 Alcohol abuse, uncomplicated: Secondary | ICD-10-CM

## 2013-04-30 NOTE — Patient Instructions (Addendum)
Your physician wants you to follow-up in: 6 months with PA/NP. (January 2015). You will receive a reminder letter in the mail two months in advance. If you don't receive a letter, please call our office to schedule the follow-up appointment.   Your physician wants you to follow-up in: 1 year with Dr Shirlee Latch. (July 2015).  You will receive a reminder letter in the mail two months in advance. If you don't receive a letter, please call our office to schedule the follow-up appointment.

## 2013-04-30 NOTE — Telephone Encounter (Signed)
LM for [name] that Dr Shirlee Latch told pt today she can discontinue life vest.

## 2013-04-30 NOTE — Progress Notes (Signed)
[name], female   DOB: Sep 04, 1961, 52 y.o.   MRN: [id number] PCP: Dr. Dorthula Rue  52 yo with history of ETOH abuse and prior substance abuse was admitted to [facility] in 3/14 after a cardiac arrest.  She was found down and underwent defibrillation in the field by EMS.  In the hospital, she remained in NSR.  She underwent hypothermia protocol.  LHC showed no CAD.  Initial echo showed EF 25% with moderate MR.  She recovered and was extubated.  Cardiac MRI done prior to discharge showed recovery of EF to 51% with small areas of basal inferior and mid inferolateral delayed enhancement.  Initially, she was hypokalemic.  She also reported having quit drinking a couple of days before the cardiac arrest.  She had been a heavy drinker prior.  She is now home, wearing a Lifevest.  She is no longer drinking ETOH or smoking cigarettes.  She feels good overall.  Currently no exertional dyspnea.  No orthopnea, no PND, no chest pain, no lightheadedness, no tachypalpitations.  Echo was done in 6/14 showing EF 55%.   Labs (4/14): K 3.7, creatinine 0.7, HCT 29.5  ECG: NSR, LBBB   PMH: 1. B12 deficiency 2. ETOH abuse 3. LBBB 4. Prior drug abuse 5. Cardiac arrest: 3/14.  Defibrillated in the field.  Hypothermia protocol.  Initial echo with EF 25%, moderate eccentric MR.  cMRI done a week or so later showed EF 51%, mid to apical septal hypokinesis, normal RV, small area of delayed enhancement in the basal inferior and mid inferolateral walls, mild MR.  LHC showed no CAD.  Echo (6/14) with EF 55%, mild septal hypokinesis, normal RV size and systolic function.   SH: Prior ETOH abuse, now abstinent.  Prior drug abuse, now abstinent.  Prior smoking.   FH: No CAD, no cardiomyopathy, no cardiac arrest.   Current Outpatient Prescriptions  Medication Sig Dispense Refill  . aspirin EC 81 MG tablet Take 81 mg by mouth daily.      . carvedilol (COREG) 6.25 MG tablet Take 1 tablet (6.25 mg total) by mouth 2  (two) times daily with a meal.  60 tablet  6  . cyanocobalamin (,VITAMIN B-12,) 1000 MCG/ML injection Inject 1 mL (1,000 mcg total) into the muscle every 30 (thirty) days.  1 mL  0  . famotidine (PEPCID) 20 MG tablet Take 1 tablet (20 mg total) by mouth 2 (two) times daily.  60 tablet  6  . folic acid (FOLVITE) 1 MG tablet Take 1 tablet (1 mg total) by mouth daily.  30 tablet  6  . lisinopril (PRINIVIL,ZESTRIL) 5 MG tablet Take 1 tablet (5 mg total) by mouth daily.  30 tablet  6  . loratadine (CLARITIN) 10 MG tablet Take 1 tablet (10 mg total) by mouth daily.  100 tablet  3  . thiamine 100 MG tablet Take 1 tablet (100 mg total) by mouth daily.  30 tablet  6   No current facility-administered medications for this visit.    BP 110/70  Pulse 88  Wt 144 lb (65.318 kg)  BMI 24.71 kg/m2 General: NAD Neck: No JVD, no thyromegaly or thyroid nodule.  Lungs: Clear to auscultation bilaterally with normal respiratory effort. CV: Nondisplaced PMI.  Heart regular S1/S2, no S3/S4, no murmur.  No peripheral edema.  No carotid bruit.  Normal pedal pulses.  Abdomen: Soft, nontender, no hepatosplenomegaly, no distention.   Neurologic: Alert and oriented x 3.  Psych: Normal affect. Extremities: No  clubbing or cyanosis.   Assessment/Plan: 1. Cardiac arrest: Patient has had a full recovery.  Cause of initial arrest is somewhat uncertain.  No CAD.  Initial EF was 25% by echo but she had recovery of EF to 51% by cardiac MRI by the time of discharge, so the initial low EF may have been related to myocardial stunning from the arrest.  She was hypokalemic when she first arrived to the ER.  Also, talking to her later on, it sounds like she had quit drinking 2-3 days prior to the arrest.  She was a heavy drinker before this.  It is possible that the arrest could be related to an adrenergic surge from ETOH withdrawal.  She had to be treated for withdrawal prior to extubation.  An ICD was not placed while in the hospital,  but she has been wearing a Lifevest.  No discharge of the Lifevest.  Repeat echo in 6/14 showed normal EF, so I will let her take off the Lifevest.  She will continue Coreg.  2. ETOH abuse: She has quit drinking completely.  I congratulated her on this.  3. Cardiomyopathy: EF recovered to 51% prior to discharge from the hospital and was 55% in 6/14.  She will continue current doses of Coreg and lisinopril for now.    [name] 04/30/2013

## 2013-05-30 ENCOUNTER — Encounter: Payer: Self-pay | Admitting: Internal Medicine

## 2013-06-02 ENCOUNTER — Encounter: Payer: Self-pay | Admitting: Internal Medicine

## 2013-06-02 ENCOUNTER — Ambulatory Visit (INDEPENDENT_AMBULATORY_CARE_PROVIDER_SITE_OTHER): Payer: Self-pay | Admitting: Internal Medicine

## 2013-06-02 ENCOUNTER — Ambulatory Visit (HOSPITAL_COMMUNITY)
Admission: RE | Admit: 2013-06-02 | Discharge: 2013-06-02 | Disposition: A | Discharge status: Home / Self Care | Payer: Self-pay | Source: Ambulatory Visit | Attending: Internal Medicine | Admitting: Internal Medicine

## 2013-06-02 VITALS — BP 131/78 | HR 92 | Temp 98.1°F | Ht 64.0 in | Wt 145.0 lb

## 2013-06-02 DIAGNOSIS — R61 Generalized hyperhidrosis: Secondary | ICD-10-CM | POA: Insufficient documentation

## 2013-06-02 DIAGNOSIS — R9389 Abnormal findings on diagnostic imaging of other specified body structures: Secondary | ICD-10-CM

## 2013-06-02 DIAGNOSIS — D649 Anemia, unspecified: Secondary | ICD-10-CM

## 2013-06-02 DIAGNOSIS — R918 Other nonspecific abnormal finding of lung field: Secondary | ICD-10-CM

## 2013-06-02 DIAGNOSIS — N951 Menopausal and female climacteric states: Secondary | ICD-10-CM | POA: Insufficient documentation

## 2013-06-02 DIAGNOSIS — D518 Other vitamin B12 deficiency anemias: Secondary | ICD-10-CM

## 2013-06-02 DIAGNOSIS — I10 Essential (primary) hypertension: Secondary | ICD-10-CM

## 2013-06-02 DIAGNOSIS — Z780 Asymptomatic menopausal state: Secondary | ICD-10-CM

## 2013-06-02 LAB — CBC WITH DIFFERENTIAL/PLATELET
Basophils Absolute: 0 10*3/uL (ref 0.0–0.1)
HCT: 37.1 % (ref 36.0–46.0)
Hemoglobin: 12.3 g/dL (ref 12.0–15.0)
Lymphocytes Relative: 30 % (ref 12–46)
Monocytes Absolute: 0.6 10*3/uL (ref 0.1–1.0)
Monocytes Relative: 5 % (ref 3–12)
Neutro Abs: 7.7 10*3/uL (ref 1.7–7.7)
RDW: 15.9 % — ABNORMAL HIGH (ref 11.5–15.5)
WBC: 12 10*3/uL — ABNORMAL HIGH (ref 4.0–10.5)

## 2013-06-02 LAB — ANEMIA PANEL
%SAT: 62 % — ABNORMAL HIGH (ref 20–55)
Folate: >20 ng/mL
Retic Ct Pct: 1 % (ref 0.4–2.3)
TIBC: 358 ug/dL (ref 250–470)
UIBC: 137 ug/dL (ref 125–400)
Vitamin B-12: 240 pg/mL (ref 211–911)

## 2013-06-02 MED ORDER — VENLAFAXINE HCL ER 75 MG PO CP24: 75.0000 mg | ORAL_CAPSULE | Freq: Every day | ORAL | Status: DC

## 2013-06-02 NOTE — Progress Notes (Signed)
  Subjective:    [name], female    DOB: 09/03/1961, 52 y.o.   MRN: [id number]  HPI [name] is a 52 yo AAF with a PMH of cardiac arrest in 3/14 of unknown etiology (LHC: no CAD), HTN, B12 deficiency, and prior ETOH/polysubstance abuse.  She c/o a 6 month h/o progressive night sweats that have gotten worse over the past 2-3 months to the point that it is interfering with her sleep.  She has to get up and change the bed sheets because she has soaked them from drenching night sweats.  She endorses feeling more agitation lately, but has no other alarming symptoms including lymphadenopathy, cough, palpitations, or weight loss.  She even reports gaining weight.  She believes it is due to menopause.  Her last LMP was about 2-3 years ago and she reports some night sweats at that time and was put on velafaxine 75mg  per day which seemed to help.  She denies any risk factors for tuberculosis or HIV.      Review of Systems  Constitutional: Negative for appetite change and unexpected weight change.  HENT: Negative.   Respiratory: Negative for cough and shortness of breath.   Cardiovascular: Negative for chest pain, palpitations and leg swelling.  Gastrointestinal: Negative for abdominal pain, diarrhea, constipation and blood in stool.  Endocrine: Negative for cold intolerance, polydipsia and polyphagia.  Genitourinary: Negative for dysuria.  Skin: Negative for rash.  Hematological: Negative for adenopathy.  Psychiatric/Behavioral: Positive for agitation.       Objective:   Physical Exam  Constitutional: She is oriented to person, place, and time. She appears well-developed and well-nourished.  HENT:  Head: Normocephalic and atraumatic.  Eyes: Conjunctivae and EOM are normal.  Neck: Neck supple.  Cardiovascular: Normal rate, regular rhythm, normal heart sounds and intact distal pulses.   Pulmonary/Chest: Effort normal and breath sounds normal. She has no wheezes. She has no  rales.  Abdominal: Soft. Bowel sounds are normal. She exhibits no distension. There is no tenderness.  Neurological: She is alert and oriented to person, place, and time.  Skin: Skin is warm and dry.       Assessment & Plan:

## 2013-06-02 NOTE — Assessment & Plan Note (Signed)
Most likely related to menopause.  Pt last LMP was 2-3 years ago during which time she experienced similar symptoms and was prescribed velafaxine.  I will prescribed 75mg  for hot flashes and have the pt f/u in 1-2 months to see if symptoms have improved.

## 2013-06-02 NOTE — Patient Instructions (Addendum)
General Instructions:  1. See me in 1-2 months 2. B12 per day-ask pharmacist to help find this if you have difficulty 3. Venlafaxine 75mg  per day for hot flashes/night sweats   Treatment Goals:  Goals (1 Years of Data) as of 06/02/13         As of Today 04/30/13 02/28/13 02/24/13 01/31/13     Blood Pressure    . Blood Pressure < 140/90  131/78 110/70 116/69 116/62 94/64      Progress Toward Treatment Goals:  Treatment Goal 06/02/2013  Blood pressure at goal  Stop smoking -    Self Care Goals & Plans:  Self Care Goal 06/02/2013  Manage my medications take my medicines as prescribed; refill my medications on time  Monitor my health -  Eat healthy foods eat foods that are low in salt; eat baked foods instead of fried foods  Be physically active take a walk every day       Care Management & Community Referrals:  Referral 06/02/2013  Referrals made for care management support none needed

## 2013-06-02 NOTE — Assessment & Plan Note (Signed)
BP Readings from Last 3 Encounters:  06/02/13 131/78  04/30/13 110/70  02/28/13 116/69    Lab Results  Component Value Date   NA 143 01/30/2013   K 3.7 01/30/2013   CREATININE 0.70 01/30/2013    Assessment: Blood pressure control: controlled Progress toward BP goal:  at goal  Plan: Medications:  continue current medications Educational resources provided: brochure;video Self management tools provided:  (none needed) Other plans: recheck in 1-2 months

## 2013-06-02 NOTE — Assessment & Plan Note (Addendum)
Will start pt on B12 orally; will stop injections for now; will repeat CBC w/diff due to a thrombocytosis and anemia in April 2014

## 2013-06-02 NOTE — Progress Notes (Signed)
I saw and evaluated the patient.  I personally confirmed the key portions of the history and exam documented by Dr. Delane Ginger and I reviewed pertinent patient test results.  The assessment, diagnosis, and plan were formulated together and I agree with the documentation in the resident's note. Pt started menopause 2 or 3 yrs ago and had hot flashes that prevented and mood swings. Started effexor and got better and went off. Sxs cont and increased. Will restart efferor. Other causes considered and R/O.

## 2013-06-02 NOTE — Assessment & Plan Note (Signed)
Pt had abnormal CXR on 01/27/2013 showing new right lower lobe collapse/consolidation and tiny right pleural  effusion; will order a repeat CXR today to see if resolved

## 2013-06-02 NOTE — Assessment & Plan Note (Addendum)
Pt experiencing hot flashes/night sweats and mood swings for 6 months; will prescribe venlafaxine 75mg  po daily for relief of symptoms; will f/u in 1-2 months

## 2013-09-22 ENCOUNTER — Ambulatory Visit (INDEPENDENT_AMBULATORY_CARE_PROVIDER_SITE_OTHER): Payer: No Typology Code available for payment source | Admitting: Internal Medicine

## 2013-09-22 ENCOUNTER — Encounter: Payer: Self-pay | Admitting: Internal Medicine

## 2013-09-22 VITALS — BP 144/94 | HR 92 | Temp 97.9°F | Wt 149.2 lb

## 2013-09-22 DIAGNOSIS — I428 Other cardiomyopathies: Secondary | ICD-10-CM

## 2013-09-22 DIAGNOSIS — N951 Menopausal and female climacteric states: Secondary | ICD-10-CM

## 2013-09-22 DIAGNOSIS — E559 Vitamin D deficiency, unspecified: Secondary | ICD-10-CM

## 2013-09-22 DIAGNOSIS — R918 Other nonspecific abnormal finding of lung field: Secondary | ICD-10-CM

## 2013-09-22 DIAGNOSIS — R9389 Abnormal findings on diagnostic imaging of other specified body structures: Secondary | ICD-10-CM

## 2013-09-22 DIAGNOSIS — E876 Hypokalemia: Secondary | ICD-10-CM

## 2013-09-22 DIAGNOSIS — Z1239 Encounter for other screening for malignant neoplasm of breast: Secondary | ICD-10-CM

## 2013-09-22 DIAGNOSIS — Z23 Encounter for immunization: Secondary | ICD-10-CM

## 2013-09-22 DIAGNOSIS — Z0000 Encounter for general adult medical examination without abnormal findings: Secondary | ICD-10-CM

## 2013-09-22 DIAGNOSIS — I10 Essential (primary) hypertension: Secondary | ICD-10-CM

## 2013-09-22 DIAGNOSIS — D72829 Elevated white blood cell count, unspecified: Secondary | ICD-10-CM

## 2013-09-22 DIAGNOSIS — D696 Thrombocytopenia, unspecified: Secondary | ICD-10-CM

## 2013-09-22 LAB — CBC WITH DIFFERENTIAL/PLATELET
Basophils Absolute: 0 10*3/uL (ref 0.0–0.1)
Basophils Relative: 0 % (ref 0–1)
Eosinophils Absolute: 0.1 10*3/uL (ref 0.0–0.7)
Eosinophils Relative: 2 % (ref 0–5)
HCT: 35.8 % — ABNORMAL LOW (ref 36.0–46.0)
Hemoglobin: 12 g/dL (ref 12.0–15.0)
Lymphs Abs: 2.5 10*3/uL (ref 0.7–4.0)
MCH: 30.3 pg (ref 26.0–34.0)
MCHC: 33.5 g/dL (ref 30.0–36.0)
MCV: 90.4 fL (ref 78.0–100.0)
Monocytes Absolute: 0.5 10*3/uL (ref 0.1–1.0)
Monocytes Relative: 7 % (ref 3–12)
Neutrophils Relative %: 53 % (ref 43–77)
RBC: 3.96 MIL/uL (ref 3.87–5.11)
RDW: 16.8 % — ABNORMAL HIGH (ref 11.5–15.5)

## 2013-09-22 LAB — COMPREHENSIVE METABOLIC PANEL
ALT: 10 U/L (ref 0–35)
AST: 17 U/L (ref 0–37)
Alkaline Phosphatase: 93 U/L (ref 39–117)
CO2: 21 mEq/L (ref 19–32)
Calcium: 9 mg/dL (ref 8.4–10.5)
Chloride: 109 mEq/L (ref 96–112)
Creat: 0.76 mg/dL (ref 0.50–1.10)
Sodium: 139 mEq/L (ref 135–145)
Total Protein: 7.1 g/dL (ref 6.0–8.3)

## 2013-09-22 MED ORDER — DESLORATADINE 5 MG PO TABS: 5.0000 mg | ORAL_TABLET | Freq: Every day | ORAL | Status: DC

## 2013-09-22 MED ORDER — CARVEDILOL PHOSPHATE ER 20 MG PO CP24: 20.0000 mg | ORAL_CAPSULE | Freq: Every day | ORAL | Status: DC

## 2013-09-22 NOTE — Progress Notes (Signed)
Patient ID: SARIE STALL, female   DOB: Dec 23, 1960, 52 y.o.   MRN: [id number]    Subjective:   Patient ID: NOVIS LEAGUE female   DOB: 08/22/1961 52 y.o.   MRN: [id number]  HPI: Ms.Ambert D Pecora is a 52 y.o. is here for a follow-visit.  She has a PMH outlined below.     Past Medical History  Diagnosis Date  . Rectal bleeding 2007    internal hemmorhoids by colonoscopy in 04/2006, Bx neg for IBD  . Duodenitis     secondary to H pylori(treatment completed 05/2006), +/- NSAIDS  . Alcoholism   . History of cocaine abuse   . PE 05/16/2009    CT angio positive for PE in 07/10, treated with coumadin for 8 months.   . Crack cocaine use   . ETOH abuse   . Anxiety   . Depression   . Pulmonary embolism     a. 04/2009 - treated with coumadin x 8 mos.  . DVT (deep venous thrombosis)     a. 04/2009 - treated with coumadin x 8 mos.  . Rectal bleeding     a. int hemorrhoids by colonoscopy in 04/2006, Bx neg for IBD  . Duodenitis     a. 05/2006 2/2 H pylori +/- NSAIDS  . History of CVA (cerebrovascular accident)   . Tobacco abuse   . Chest pain     a. 04/2009 Echo: EF 55-60%, Gr1 DD, Mild MR.  Marland Kitchen Hypertension   . Heart murmur   . Stroke   . Seizures   . Anemia   . ANEMIA, VITAMIN B12 DEFICIENCY 11/11/2007    Documented low 175 on 09/2007; received 6 yrs of IM therapy; switched to po 05/2013    Current Outpatient Prescriptions  Medication Sig Dispense Refill  . aspirin EC 81 MG tablet Take 81 mg by mouth daily.      . carvedilol (COREG) 6.25 MG tablet Take 1 tablet (6.25 mg total) by mouth 2 (two) times daily with a meal.  60 tablet  6  . Cyanocobalamin (VITAMIN B 12 PO) Take 1,000 mcg by mouth daily.      . famotidine (PEPCID) 20 MG tablet Take 1 tablet (20 mg total) by mouth 2 (two) times daily.  60 tablet  6  . folic acid (FOLVITE) 1 MG tablet Take 1 tablet (1 mg total) by mouth daily.  30 tablet  6  . lisinopril (PRINIVIL,ZESTRIL) 5 MG tablet Take 1 tablet (5 mg total) by mouth  daily.  30 tablet  6  . loratadine (CLARITIN) 10 MG tablet Take 1 tablet (10 mg total) by mouth daily.  100 tablet  3  . thiamine 100 MG tablet Take 1 tablet (100 mg total) by mouth daily.  30 tablet  6  . venlafaxine XR (EFFEXOR-XR) 75 MG 24 hr capsule Take 1 capsule (75 mg total) by mouth daily. For hot flashes  30 capsule  2  . cyanocobalamin (,VITAMIN B-12,) 1000 MCG/ML injection Inject 1 mL (1,000 mcg total) into the muscle every 30 (thirty) days.  1 mL  0   No current facility-administered medications for this visit.   Family History  Problem Relation Age of Onset  . Coronary artery disease      1 st degree female relative <60 and female <50  . Diabetes type II      1 st degree relative  . Breast cancer Mother   . Heart disease Mother   . Heart disease  Father   . Heart disease Brother   . Breast cancer      1st degree relative <50  . Diabetes Mother   . CAD Mother   . Colon cancer Father   . CAD Father   . Breast cancer Sister   . Diabetes Sister   . Diabetes Brother   . CAD Brother    History   Social History  . Marital Status: Single    Spouse Name: N/A    Number of Children: 2  . Years of Education: N/A   Occupational History  . Unemployed    Social History Main Topics  . Smoking status: Former Smoker -- 1.00 packs/day    Types: Cigarettes    Quit date: 01/16/2013  . Smokeless tobacco: Never Used     Comment: smokes about 4-5 cigs/day  . Alcohol Use: Yes     Comment: beer and liquor   . Drug Use: No     Comment: former  . Sexual Activity: No   Other Topics Concern  . None   Social History Narrative   ** Merged History Encounter **       ** Data from: 12/05/12 Enc Dept: [facility]   No cocaine or alcohol use since hospital discharge 05/19/2009.      10/13 update:  States she is smoking 1-2 cigarettes daily with at least 1 drink daily usually Brandy.                  ** Data from: 01/17/13 Enc Dept: [facility]   Pt lives in [place]  with a roommate.         Review of Systems: Review of Systems  Constitutional: Negative for fever, chills, weight loss and malaise/fatigue.  Eyes: Negative for blurred vision.  Respiratory: Negative for cough, shortness of breath and wheezing.   Cardiovascular: Negative for chest pain and leg swelling.  Gastrointestinal: Negative for nausea, vomiting, abdominal pain, diarrhea and constipation.  Genitourinary: Negative for dysuria.  Musculoskeletal: Negative for myalgias.  Skin: Negative for rash.  Neurological: Negative for dizziness and headaches.     Objective:  Physical Exam: Filed Vitals:   09/22/13 0924  BP: 144/94  Pulse: 92  Temp: 97.9 F (36.6 C)  TempSrc: Oral  Weight: 149 lb 3.2 oz (67.677 kg)  SpO2: 100%   Physical Exam  Vitals reviewed. Constitutional: She is oriented to person, place, and time and well-developed, well-nourished, and in no distress. No distress.  HENT:  Head: Normocephalic and atraumatic.  Eyes: Conjunctivae are normal. Pupils are equal, round, and reactive to light.  Neck: Neck supple.  Cardiovascular: Normal rate and regular rhythm.  Exam reveals no gallop and no friction rub.   No murmur heard. Pulmonary/Chest: Effort normal and breath sounds normal. No respiratory distress. She has no wheezes.  Abdominal: Soft. Bowel sounds are normal. There is no tenderness.  Neurological: She is alert and oriented to person, place, and time.  Skin: Skin is warm and dry. She is not diaphoretic.  Psychiatric: Mood and affect normal.     Assessment & Plan:   Plan was discussed and formulated with my attending.

## 2013-09-22 NOTE — Patient Instructions (Signed)
We will check your labs today Please continue to follow-up with Dr. Shirlee Latch

## 2013-09-23 ENCOUNTER — Telehealth: Payer: Self-pay | Admitting: Internal Medicine

## 2013-09-23 ENCOUNTER — Other Ambulatory Visit: Payer: Self-pay | Admitting: Internal Medicine

## 2013-09-23 ENCOUNTER — Ambulatory Visit (HOSPITAL_COMMUNITY): Payer: No Typology Code available for payment source

## 2013-09-23 DIAGNOSIS — E559 Vitamin D deficiency, unspecified: Secondary | ICD-10-CM | POA: Insufficient documentation

## 2013-09-23 LAB — VITAMIN D 25 HYDROXY (VIT D DEFICIENCY, FRACTURES): Vit D, 25-Hydroxy: <10 ng/mL — ABNORMAL LOW (ref 30–89)

## 2013-09-23 MED ORDER — CHOLECALCIFEROL 25 MCG (1000 UT) PO CAPS: 1000.0000 [IU] | ORAL_CAPSULE | Freq: Every day | ORAL | Status: DC

## 2013-09-23 MED ORDER — CHOLECALCIFEROL 1.25 MG (50000 UT) PO CAPS: 50000.0000 [IU] | ORAL_CAPSULE | ORAL | Status: DC

## 2013-09-23 NOTE — Assessment & Plan Note (Signed)
Pt's vitamin D level was low.  -begin 50,000 units of D3 once weekly for 6 weeks followed by maintenance dose of 1000 units daily

## 2013-09-23 NOTE — Assessment & Plan Note (Signed)
Pt symptoms have improved on velafaxine 75mg  daily.   -continue current treatment

## 2013-09-23 NOTE — Assessment & Plan Note (Signed)
BP Readings from Last 3 Encounters:  09/22/13 144/94  06/02/13 131/78  04/30/13 110/70    Lab Results  Component Value Date   NA 139 09/22/2013   K 3.8 09/22/2013   CREATININE 0.76 09/22/2013    Assessment: Blood pressure control: mildly elevated Progress toward BP goal:  deteriorated Comments: pt forgot to take her BP medicine today; additionally she had just walked from the parking lot  Plan: Medications:  continue current medications Other plans: continue to monitor for persistent elevation

## 2013-09-23 NOTE — Assessment & Plan Note (Signed)
Resolved.   Lab Results  Component Value Date   WBC 6.6 09/22/2013   HGB 12.0 09/22/2013   HCT 35.8* 09/22/2013   MCV 90.4 09/22/2013   PLT 236 09/22/2013

## 2013-09-23 NOTE — Progress Notes (Signed)
Quick Note:  I will start 50,000 units of D3 for 6 weeks followed by 800 units thereafter. ______

## 2013-09-23 NOTE — Assessment & Plan Note (Addendum)
Pt is followed by Dr. Shirlee Latch.  TEE on 04/16/13 showed LVEFof 55% with mild septal hypokinesis.  LHC on 01/16/13 showed no CAD.    -keep scheduled appt with Dr. Shirlee Latch next month -continue current medical management

## 2013-09-23 NOTE — Assessment & Plan Note (Signed)
Resolved.  Pt had abnormal CXR on 01/27/2013 showing new right lower lobe collapse/consolidation and tiny right pleural  effusion; repeat CXR on 06/02/13 showed no acute cardiopulmonary findings.

## 2013-09-24 ENCOUNTER — Telehealth: Payer: Self-pay | Admitting: *Deleted

## 2013-09-24 MED ORDER — ERGOCALCIFEROL 1.25 MG (50000 UT) PO CAPS: 50000.0000 [IU] | ORAL_CAPSULE | ORAL | Status: DC

## 2013-09-24 NOTE — Telephone Encounter (Signed)
Please call pharmacy for clarification on Vitamin D Rx. Pharmacy usually uses Vit D/2 - which is also 50,000 units but they don't carry Vit D/3 that was ordered.  Not much difference. Pharmacy # [phone]

## 2013-09-24 NOTE — Telephone Encounter (Signed)
Vitamin D2 is fine.  I called the pharmacy and they will change it to D2. Thanks.

## 2013-09-24 NOTE — Telephone Encounter (Signed)
Pharmacy can not get coreg cr, could you please change to regular cavedilol?

## 2013-09-24 NOTE — Telephone Encounter (Signed)
The [facility] requested that I change Ms.Steagall's carvedilol to coreg cr as this would be free for her.  Was this [facility] that indicated they could not get it?  Please advise.  If unable to obtain from [facility] please change back to carvedilol 3.125mg  two times daily with meals.  Thanks.

## 2013-09-29 ENCOUNTER — Other Ambulatory Visit: Payer: Self-pay | Admitting: Internal Medicine

## 2013-09-29 MED ORDER — CARVEDILOL 3.125 MG PO TABS: 3.1250 mg | ORAL_TABLET | Freq: Two times a day (BID) | ORAL | Status: DC

## 2013-09-29 NOTE — Telephone Encounter (Signed)
Thanks

## 2013-09-29 NOTE — Telephone Encounter (Signed)
As per our conversation i have called it to the pharmacy... 3.125mg  1 twice daily w/ a meal #60 w/ 11 refills, please change the med list

## 2013-09-29 NOTE — Telephone Encounter (Signed)
This was a request from the pharmacy.

## 2013-09-30 NOTE — Progress Notes (Signed)
I saw and evaluated the patient.  I personally confirmed the key portions of the history and exam documented by Dr. Gill and I reviewed pertinent patient test results.  The assessment, diagnosis, and plan were formulated together and I agree with the documentation in the resident's note. 

## 2013-10-09 ENCOUNTER — Other Ambulatory Visit: Payer: Self-pay | Admitting: *Deleted

## 2013-10-09 ENCOUNTER — Ambulatory Visit (HOSPITAL_COMMUNITY)
Admission: RE | Admit: 2013-10-09 | Discharge: 2013-10-09 | Disposition: A | Discharge status: Home / Self Care | Payer: No Typology Code available for payment source | Source: Ambulatory Visit | Attending: Internal Medicine | Admitting: Internal Medicine

## 2013-10-09 ENCOUNTER — Telehealth: Payer: Self-pay | Admitting: *Deleted

## 2013-10-09 DIAGNOSIS — Z1239 Encounter for other screening for malignant neoplasm of breast: Secondary | ICD-10-CM

## 2013-10-09 DIAGNOSIS — Z1231 Encounter for screening mammogram for malignant neoplasm of breast: Secondary | ICD-10-CM | POA: Insufficient documentation

## 2013-10-09 NOTE — Telephone Encounter (Signed)
Received a call from [facility] with clarification of dose of Coreg. Pharmacist called Nicolette Bang and confirmed pt was taking coreg 6.25 mg last filled August 29. ( not sure if pt has taken this dose) She is now taking coreg 3.125 mg twice a day. [facility] can order coreg CR and they have the order for Coreg CR 20 mg.   The conversion from Coreg 3.125 mg twice a day is Coreg CR 10 mg daily. Do you want to change to this dose? Our contact at [facility] is Crystal # [phone]  Please respond for the dose of your choice will be ordered.

## 2013-10-09 NOTE — Telephone Encounter (Signed)
I spoke with [facility] several weeks ago regarding Ms. Rochelle's prescription and the correct prescription was provided to them at that time so I am not sure of the confusion.  Please call [facility] again with the appropriate prescription for Coreg CR 20mg  daily.  The only dose I have filled for Ms. Bayle is the Coreg CR 20mg .  I discontinued the 6.25mg  twice daily at her last office visit because [facility] requested the change so that Ms. Kroeker could get her medication for free.  Thanks.

## 2013-10-09 NOTE — Telephone Encounter (Signed)
[facility] will need a new Rx for Coreg 6.25 mg until the Coreg RC 20 arrives within 6 weeks. Your last Rx for coreg on 12/1 was for coreg 3.125 mg twice a day.

## 2013-10-09 NOTE — Telephone Encounter (Signed)
If she needs a prescription for the 6.25mg  twice daily until the Coreg CR arrives please provide that to the appropriate pharmacy whether [facility] or [facility].  Thanks.

## 2013-10-09 NOTE — Telephone Encounter (Signed)
Per cardiology's recommendations she should be taking 6.25mg  of Coreg twice daily.  The equivalent of that would be Coreg CR 20mg  daily.  This is the dosage that [facility] should provide.  Thanks.

## 2013-10-10 ENCOUNTER — Other Ambulatory Visit: Payer: Self-pay | Admitting: Internal Medicine

## 2013-10-10 MED ORDER — CARVEDILOL 6.25 MG PO TABS: 6.2500 mg | ORAL_TABLET | Freq: Two times a day (BID) | ORAL | Status: DC

## 2013-10-10 NOTE — Telephone Encounter (Signed)
I have ordered the Coreg in Epic.  Thanks.

## 2013-10-10 NOTE — Telephone Encounter (Signed)
Pt does need a Rx for Coreg 6.25 mg but you have to enter into EPIC before I can call it in. I can't enter in a new dose of a medication.

## 2013-10-13 NOTE — Telephone Encounter (Signed)
Rx called to [facility].  Crystal at [facility] will call [facility] and be sure Rx was not called to them,

## 2013-12-01 ENCOUNTER — Encounter: Payer: Self-pay | Admitting: Internal Medicine

## 2013-12-01 ENCOUNTER — Ambulatory Visit (INDEPENDENT_AMBULATORY_CARE_PROVIDER_SITE_OTHER): Payer: No Typology Code available for payment source | Admitting: Internal Medicine

## 2013-12-01 VITALS — BP 179/79 | HR 69 | Temp 98.1°F | Ht 64.0 in | Wt 146.5 lb

## 2013-12-01 DIAGNOSIS — I428 Other cardiomyopathies: Secondary | ICD-10-CM

## 2013-12-01 DIAGNOSIS — Z0000 Encounter for general adult medical examination without abnormal findings: Secondary | ICD-10-CM

## 2013-12-01 DIAGNOSIS — E559 Vitamin D deficiency, unspecified: Secondary | ICD-10-CM

## 2013-12-01 DIAGNOSIS — I10 Essential (primary) hypertension: Secondary | ICD-10-CM

## 2013-12-01 DIAGNOSIS — D518 Other vitamin B12 deficiency anemias: Secondary | ICD-10-CM

## 2013-12-01 DIAGNOSIS — D649 Anemia, unspecified: Secondary | ICD-10-CM

## 2013-12-01 NOTE — Assessment & Plan Note (Signed)
Health maintenance UTD.

## 2013-12-01 NOTE — Progress Notes (Signed)
Subjective:   [name] female    DOB: 25-Jul-1961 53 y.o.    MRN: [id number] Health Maintenance Due: No health maintenance topics applied.  _________________________________________________  HPI: [name] is a 53 y.o. female here for a routine visit.  Pt has a PMH outlined below.  Please see problem-based charting assessment and plan note for further details of medical issues addressed at today's visit.  PMH: Past Medical History  Diagnosis Date  . Rectal bleeding 2007    internal hemmorhoids by colonoscopy in 04/2006, Bx neg for IBD  . Duodenitis     secondary to H pylori(treatment completed 05/2006), +/- NSAIDS  . Alcoholism   . History of cocaine abuse   . PE 05/16/2009    CT angio positive for PE in 07/10, treated with coumadin for 8 months.   . Crack cocaine use   . ETOH abuse   . Anxiety   . Depression   . Pulmonary embolism     a. 04/2009 - treated with coumadin x 8 mos.  . DVT (deep venous thrombosis)     a. 04/2009 - treated with coumadin x 8 mos.  . Rectal bleeding     a. int hemorrhoids by colonoscopy in 04/2006, Bx neg for IBD  . Duodenitis     a. 05/2006 2/2 H pylori +/- NSAIDS  . History of CVA (cerebrovascular accident)   . Tobacco abuse   . Chest pain     a. 04/2009 Echo: EF 55-60%, Gr1 DD, Mild MR.  Marland Kitchen Hypertension   . Heart murmur   . Stroke   . Seizures   . Anemia   . ANEMIA, VITAMIN B12 DEFICIENCY 11/11/2007    Documented low 175 on 09/2007; received 6 yrs of IM therapy; switched to po 05/2013     Medications: Current Outpatient Prescriptions on File Prior to Visit  Medication Sig Dispense Refill  . aspirin EC 81 MG tablet Take 81 mg by mouth daily.      . carvedilol (COREG) 6.25 MG tablet Take 1 tablet (6.25 mg total) by mouth 2 (two) times daily with a meal.  60 tablet  6  . Cholecalciferol 1000 UNITS capsule Take 1 capsule (1,000 Units total) by mouth daily.  30 capsule  12  . Cyanocobalamin (VITAMIN B 12 PO) Take  1,000 mcg by mouth daily.      Marland Kitchen desloratadine (CLARINEX) 5 MG tablet Take 1 tablet (5 mg total) by mouth daily.  30 tablet  12  . famotidine (PEPCID) 20 MG tablet Take 1 tablet (20 mg total) by mouth 2 (two) times daily.  60 tablet  6  . folic acid (FOLVITE) 1 MG tablet Take 1 tablet (1 mg total) by mouth daily.  30 tablet  6  . lisinopril (PRINIVIL,ZESTRIL) 5 MG tablet Take 1 tablet (5 mg total) by mouth daily.  30 tablet  6  . thiamine 100 MG tablet Take 1 tablet (100 mg total) by mouth daily.  30 tablet  6  . venlafaxine XR (EFFEXOR-XR) 75 MG 24 hr capsule Take 1 capsule (75 mg total) by mouth daily. For hot flashes  30 capsule  2  . carvedilol (COREG) 3.125 MG tablet Take 1 tablet (3.125 mg total) by mouth 2 (two) times daily with a meal.  60 tablet  3  . ergocalciferol (VITAMIN D2) 50000 UNITS capsule Take 1 capsule (50,000 Units total) by mouth once a week. Take 1 capsule by mouth once a week for  6 weeks  6 capsule  0   No current facility-administered medications on file prior to visit.    Allergies: Allergies  Allergen Reactions  . Omnipaque [Iohexol] Other (See Comments)    Seizure   . Omnipaque [Iohexol] Other (See Comments)    Seizure   . Iodinated Diagnostic Agents     FH: Family History  Problem Relation Age of Onset  . Coronary artery disease      1 st degree female relative <60 and female <50  . Diabetes type II      1 st degree relative  . Breast cancer Mother   . Heart disease Mother   . Heart disease Father   . Heart disease Brother   . Breast cancer      1st degree relative <50  . Diabetes Mother   . CAD Mother   . Colon cancer Father   . CAD Father   . Breast cancer Sister   . Diabetes Sister   . Diabetes Brother   . CAD Brother     SH: History   Social History  . Marital Status: Single    Spouse Name: N/A    Number of Children: 2  . Years of Education: N/A   Occupational History  . Unemployed    Social History Main Topics  . Smoking  status: Former Smoker -- 1.00 packs/day    Types: Cigarettes    Quit date: 01/16/2013  . Smokeless tobacco: Never Used     Comment: smokes about 4-5 cigs/day  . Alcohol Use: Yes     Comment: beer and liquor   . Drug Use: No     Comment: former  . Sexual Activity: No   Other Topics Concern  . None   Social History Narrative   ** Merged History Encounter **       ** Data from: 12/05/12 Enc Dept: [facility]   No cocaine or alcohol use since hospital discharge 05/19/2009.      10/13 update:  States she is smoking 1-2 cigarettes daily with at least 1 drink daily usually Brandy.                  ** [facility]: [id number]   [place]          Review of Systems: Constitutional: Negative for fever, chills and weight loss.  Eyes: Negative for blurred vision.  Respiratory: Negative for cough and shortness of breath.  Cardiovascular: Negative for chest pain, palpitations and leg swelling.  Gastrointestinal: Negative for nausea, vomiting, abdominal pain, diarrhea, constipation and blood in stool.  Genitourinary: Negative for dysuria, urgency and frequency.  Musculoskeletal: Negative for myalgias and back pain.  Neurological: Negative for dizziness, weakness and headaches.     Objective:   Vital Signs: Filed Vitals:   12/01/13 1615 12/01/13 1625  BP: 138/91 179/79  Pulse: 94 69  Temp: 98.1 F (36.7 C)   TempSrc: Oral   Height: 5\' 4"  (1.626 m)   Weight: 146 lb 8 oz (66.452 kg)   SpO2: 99%       BP Readings from Last 3 Encounters:  12/01/13 179/79  09/22/13 144/94  06/02/13 131/78    Physical Exam: Constitutional: Vital signs reviewed.  Patient is well-developed and well-nourished in NAD and cooperative with exam.  Head: Normocephalic and atraumatic. Eyes: PERRL, EOMI, conjunctivae nl, no scleral icterus.  Neck: Supple. Cardiovascular: RRR, no MRG. Pulmonary/Chest: normal  effort, non-tender to palpation,  CTAB, no wheezes, rales, or rhonchi. Abdominal: Soft. NT/ND +BS. Neurological: A&O x3, cranial nerves II-XII are grossly intact, moving all extremities. Extremities: 2+DP b/l; no pitting edema. Skin: Warm, dry and intact. No rash.  Most Recent Laboratory Results:  CMP     Component Value Date/Time   NA 139 09/22/2013 1039   K 3.8 09/22/2013 1039   CL 109 09/22/2013 1039   CO2 21 09/22/2013 1039   GLUCOSE 108* 09/22/2013 1039   BUN 14 09/22/2013 1039   CREATININE 0.76 09/22/2013 1039   CREATININE 0.70 01/30/2013 0530   CALCIUM 9.0 09/22/2013 1039   PROT 7.1 09/22/2013 1039   ALBUMIN 4.2 09/22/2013 1039   AST 17 09/22/2013 1039   ALT 10 09/22/2013 1039   ALKPHOS 93 09/22/2013 1039   BILITOT 0.5 09/22/2013 1039   GFRNONAA >90 01/30/2013 0530   GFRAA >90 01/30/2013 0530    CBC    Component Value Date/Time   WBC 6.6 09/22/2013 1039   RBC 3.96 09/22/2013 1039   RBC 4.35 06/02/2013 1010   HGB 12.0 09/22/2013 1039   HCT 35.8* 09/22/2013 1039   PLT 236 09/22/2013 1039   MCV 90.4 09/22/2013 1039   MCH 30.3 09/22/2013 1039   MCHC 33.5 09/22/2013 1039   RDW 16.8* 09/22/2013 1039   LYMPHSABS 2.5 09/22/2013 1039   MONOABS 0.5 09/22/2013 1039   EOSABS 0.1 09/22/2013 1039   BASOSABS 0.0 09/22/2013 1039    Lipid Panel Lab Results  Component Value Date   CHOL 257* 10/05/2011   HDL 97 10/05/2011   LDLCALC 127* 10/05/2011   TRIG 165* 10/05/2011   CHOLHDL 2.6 10/05/2011    HA1C Lab Results  Component Value Date   HGBA1C  Value: 5.2 (NOTE) The ADA recommends the following therapeutic goal for glycemic control related to Hgb A1c measurement: Goal of therapy: <6.5 Hgb A1c  Reference: American Diabetes Association: Clinical Practice Recommendations 2010, Diabetes Care, 2010, 33: (Suppl  1). 05/17/2009    Urinalysis    Component Value Date/Time   COLORURINE YELLOW 01/16/2013 1352   APPEARANCEUR CLEAR 01/16/2013 1352   LABSPEC 1.015 01/16/2013 1352   PHURINE 7.0 01/16/2013 1352   GLUCOSEU  500* 01/16/2013 1352   HGBUR SMALL* 01/16/2013 1352   HGBUR negative 01/06/2010 1058   BILIRUBINUR NEGATIVE 01/16/2013 1352   KETONESUR NEGATIVE 01/16/2013 1352   PROTEINUR 30* 01/16/2013 1352   UROBILINOGEN 0.2 01/16/2013 1352   NITRITE NEGATIVE 01/16/2013 1352   LEUKOCYTESUR NEGATIVE 01/16/2013 1352    Urine Microalbumin No results found for this basename: MICROALBUR, [id number]    Imaging N/A   Assessment & Plan:   Assessment and plan was discussed and formulated with my attending.

## 2013-12-01 NOTE — Assessment & Plan Note (Signed)
Followed by Dr. Shirlee Latch.  Most likely due to alcohol abuse-pt former polysubstance abuse.  Echo 04/16/13: Estimated ejection fraction was 55%. Mild septal hypokinesis.  LHC 01/16/13: No CAD.  -continue to follow-up with Dr. Fara Chute appt next Month

## 2013-12-01 NOTE — Assessment & Plan Note (Signed)
Continue Vit D 1000 units daily

## 2013-12-01 NOTE — Patient Instructions (Signed)
Thank you for your visit today. Please return to the internal medicine clinic in 6 month(s) or sooner if needed.    Your current medical regimen is effective;  continue present plan and all medications. Please be sure to bring all of your medications with you to every visit.  Please follow up with Dr. Shirlee Latch in March.  Should you have any new or worsening symptoms, please be sure to call the clinic at [phone].  If you believe that you are suffering from a life threatening condition or one that may result in the loss of limb or function, then you should call 911 or proceed to the nearest Emergency Department.

## 2013-12-01 NOTE — Assessment & Plan Note (Signed)
-  continue VitB12 daily

## 2013-12-01 NOTE — Assessment & Plan Note (Signed)
BP Readings from Last 3 Encounters:  12/01/13 179/79  09/22/13 144/94  06/02/13 131/78    Lab Results  Component Value Date   NA 139 09/22/2013   K 3.8 09/22/2013   CREATININE 0.76 09/22/2013    Assessment: Blood pressure control: mildly elevated Progress toward BP goal:  improved Comments: repeat BP 138/91; pt reports compliance with medications  Plan: Medications:  continue current medications Other plans: reassess at next visit

## 2013-12-02 NOTE — Progress Notes (Signed)
Case discussed with Dr. Gill at time of visit.  We reviewed the resident's history and exam and pertinent patient test results.  I agree with the assessment, diagnosis, and plan of care documented in the resident's note. 

## 2014-01-16 ENCOUNTER — Other Ambulatory Visit: Payer: Self-pay | Admitting: *Deleted

## 2014-01-18 MED ORDER — VENLAFAXINE HCL ER 75 MG PO CP24: 75.0000 mg | ORAL_CAPSULE | Freq: Every day | ORAL | Status: DC

## 2014-01-19 NOTE — Telephone Encounter (Signed)
Rx faxed in.

## 2014-01-26 ENCOUNTER — Encounter: Payer: Self-pay | Admitting: Internal Medicine

## 2014-01-26 ENCOUNTER — Ambulatory Visit (INDEPENDENT_AMBULATORY_CARE_PROVIDER_SITE_OTHER): Payer: No Typology Code available for payment source | Admitting: Internal Medicine

## 2014-01-26 VITALS — BP 134/79 | HR 89 | Temp 98.3°F | Ht 64.0 in | Wt 141.6 lb

## 2014-01-26 DIAGNOSIS — M25569 Pain in unspecified knee: Secondary | ICD-10-CM

## 2014-01-26 DIAGNOSIS — M25561 Pain in right knee: Secondary | ICD-10-CM | POA: Insufficient documentation

## 2014-01-26 NOTE — Patient Instructions (Signed)
Please continue to take Tylenol (ACETAMINOPHEN)  500mg .  You can take a maximum of 8 pills in 1 day.  Please call if pain is still uncontrolled.  Please bring your medicines with you each time you come.   Medicines may be  Eye drops  Herbal   Vitamins  Pills  Seeing these help [place] take care of you.  Acetaminophen tablets or caplets What is this medicine? ACETAMINOPHEN (a set a MEE noe fen) is a pain reliever. It is used to treat mild pain and fever. This medicine may be used for other purposes; ask your health care provider or pharmacist if you have questions. COMMON BRAND NAME(S): Aceta, Actamin, Anacin Aspirin Free, Genapap, Genebs, Mapap, Pain & Fever , Pain and Fever , PAIN RELIEF , PAIN RELIEF Extra Strength, Pain Reliever, Panadol, Q-Pap Extra Strength, Q-Pap, Tylenol CrushableTablet, Tylenol Extra Strength, Tylenol, XS No Aspirin, XS Pain Reliever What should I tell my health care provider before I take this medicine? They need to know if you have any of these conditions: -if you often drink alcohol -liver disease -an unusual or allergic reaction to acetaminophen, other medicines, foods, dyes, or preservatives -pregnant or trying to get pregnant -breast-feeding How should I use this medicine? Take this medicine by mouth with a glass of water. Follow the directions on the package or prescription label. Take your medicine at regular intervals. Do not take your medicine more often than directed. Talk to your pediatrician regarding the use of this medicine in children. While this drug may be prescribed for children as young as 87 years of age for selected conditions, precautions do apply. Overdosage: If you think you have taken too much of this medicine contact a poison control center or emergency room at once. NOTE: This medicine is only for you. Do not share this medicine with others. What if I miss a dose? If you miss a dose, take it as soon as you can. If it is almost time  for your next dose, take only that dose. Do not take double or extra doses. What may interact with this medicine? -alcohol -imatinib -isoniazid -other medicines with acetaminophen This list may not describe all possible interactions. Give your health care provider a list of all the medicines, herbs, non-prescription drugs, or dietary supplements you use. Also tell them if you smoke, drink alcohol, or use illegal drugs. Some items may interact with your medicine. What should I watch for while using this medicine? Tell your doctor or health care professional if the pain lasts more than 10 days (5 days for children), if it gets worse, or if there is a new or different kind of pain. Also, check with your doctor if a fever lasts for more than 3 days. Do not take other medicines that contain acetaminophen with this medicine. Always read labels carefully. If you have questions, ask your doctor or pharmacist. If you take too much acetaminophen get medical help right away. Too much acetaminophen can be very dangerous and cause liver damage. Even if you do not have symptoms, it is important to get help right away. What side effects may I notice from receiving this medicine? Side effects that you should report to your doctor or health care professional as soon as possible: -allergic reactions like skin rash, itching or hives, swelling of the face, lips, or tongue -breathing problems -fever or sore throat -redness, blistering, peeling or loosening of the skin, including inside the mouth -trouble passing urine or change in the amount  of urine -unusual bleeding or bruising -unusually weak or tired -yellowing of the eyes or skin Side effects that usually do not require medical attention (report to your doctor or health care professional if they continue or are bothersome): -headache -nausea, stomach upset This list may not describe all possible side effects. Call your doctor for medical advice about side  effects. You may report side effects to FDA at 1-800-FDA-1088. Where should I keep my medicine? Keep out of reach of children. Store at room temperature between 20 and 25 degrees C (68 and 77 degrees F). Protect from moisture and heat. Throw away any unused medicine after the expiration date. NOTE: This sheet is a summary. It may not cover all possible information. If you have questions about this medicine, talk to your doctor, pharmacist, or health care provider.  2014, Elsevier/Gold Standard. (2013-06-09 12:54:16)

## 2014-01-26 NOTE — Assessment & Plan Note (Signed)
Previous imaging in 2010 showed no joint space narrowing but some mild arthritic changes. Pain is likely due to R knee OA.  Patient is not maxed out of 1st line therapy with Tylenol. Do not suspect DVT. - Encourage patient to continue Tylenol with max of 4g per day. - If pain not controlled will consider NSAID therapy as next option.
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
## 2014-01-26 NOTE — Progress Notes (Signed)
Case discussed with Dr. Hoffman at the time of the visit.  We reviewed the resident's history and exam and pertinent patient test results.  I agree with the assessment, diagnosis and plan of care documented in the resident's note. 

## 2014-03-11 ENCOUNTER — Ambulatory Visit: Payer: Self-pay

## 2014-03-30 ENCOUNTER — Other Ambulatory Visit: Payer: Self-pay | Admitting: *Deleted

## 2014-03-30 MED ORDER — FOLIC ACID 1 MG PO TABS: 1.0000 mg | ORAL_TABLET | Freq: Every day | ORAL | Status: DC

## 2014-03-30 MED ORDER — THIAMINE HCL 100 MG PO TABS: 100.0000 mg | ORAL_TABLET | Freq: Every day | ORAL | Status: DC

## 2014-05-11 ENCOUNTER — Encounter: Payer: Self-pay | Admitting: Internal Medicine

## 2014-05-17 ENCOUNTER — Other Ambulatory Visit: Payer: Self-pay | Admitting: Internal Medicine

## 2014-05-18 ENCOUNTER — Ambulatory Visit (HOSPITAL_COMMUNITY)
Admission: RE | Admit: 2014-05-18 | Discharge: 2014-05-18 | Disposition: A | Discharge status: Home / Self Care | Payer: No Typology Code available for payment source | Source: Ambulatory Visit | Attending: Internal Medicine | Admitting: Internal Medicine

## 2014-05-18 ENCOUNTER — Ambulatory Visit: Payer: Self-pay

## 2014-05-18 ENCOUNTER — Ambulatory Visit (INDEPENDENT_AMBULATORY_CARE_PROVIDER_SITE_OTHER): Payer: No Typology Code available for payment source | Admitting: Internal Medicine

## 2014-05-18 ENCOUNTER — Encounter: Payer: Self-pay | Admitting: Internal Medicine

## 2014-05-18 VITALS — BP 142/76 | HR 92 | Temp 97.9°F | Ht 64.0 in | Wt 131.4 lb

## 2014-05-18 DIAGNOSIS — I517 Cardiomegaly: Secondary | ICD-10-CM | POA: Insufficient documentation

## 2014-05-18 DIAGNOSIS — R079 Chest pain, unspecified: Secondary | ICD-10-CM | POA: Insufficient documentation

## 2014-05-18 DIAGNOSIS — M25569 Pain in unspecified knee: Secondary | ICD-10-CM

## 2014-05-18 DIAGNOSIS — Z8674 Personal history of sudden cardiac arrest: Secondary | ICD-10-CM | POA: Insufficient documentation

## 2014-05-18 DIAGNOSIS — I10 Essential (primary) hypertension: Secondary | ICD-10-CM

## 2014-05-18 DIAGNOSIS — E559 Vitamin D deficiency, unspecified: Secondary | ICD-10-CM

## 2014-05-18 DIAGNOSIS — M25561 Pain in right knee: Secondary | ICD-10-CM

## 2014-05-18 DIAGNOSIS — I447 Left bundle-branch block, unspecified: Secondary | ICD-10-CM | POA: Insufficient documentation

## 2014-05-18 LAB — LIPID PANEL
CHOLESTEROL: 247 mg/dL — AB (ref 0–200)
HDL: 90 mg/dL (ref >39)
LDL Cholesterol: 106 mg/dL — ABNORMAL HIGH (ref 0–99)
TRIGLYCERIDES: 256 mg/dL — AB (ref <150)
Total CHOL/HDL Ratio: 2.7 Ratio
VLDL: 51 mg/dL — ABNORMAL HIGH (ref 0–40)

## 2014-05-18 LAB — TROPONIN I: Troponin I: <0.3 ng/mL (ref <0.06)

## 2014-05-18 MED ORDER — CARVEDILOL 12.5 MG PO TABS: 12.5000 mg | ORAL_TABLET | Freq: Two times a day (BID) | ORAL | Status: DC

## 2014-05-18 MED ORDER — FOLIC ACID 1 MG PO TABS: 1.0000 mg | ORAL_TABLET | Freq: Every day | ORAL | Status: DC

## 2014-05-18 NOTE — Patient Instructions (Addendum)
Thank you for your visit today.   Please return to the internal medicine clinic in 3 month(s) or sooner if needed.     I have made the following additions/changes to your medications: I have increased your carvedilol to 12.5mg  twice daily.  Continue to take your lisinopril 5mg  daily and aspirin 81mg  daily.   I have made the following referrals for you: cardiology, Dr. Shirlee Latch.  Please be sure to bring all of your medications with you to every visit; this includes herbal supplements, vitamins, eye drops, and any over-the-counter medications.   Should you have any questions regarding your medications and/or any new or worsening symptoms, please be sure to call the clinic at [phone].   If you continue to have chest pain or believe that you are suffering from a life threatening condition or one that may result in the loss of limb or function, then you should call 911 or proceed to the nearest Emergency Department.     A healthy lifestyle and preventative care can promote health and wellness.   Maintain regular health, dental, and eye exams.  Eat a healthy diet. Foods like vegetables, fruits, whole grains, low-fat dairy products, and lean protein foods contain the nutrients you need without too many calories. Decrease your intake of foods high in solid fats, added sugars, and salt. Get information about a proper diet from your caregiver, if necessary.  Regular physical exercise is one of the most important things you can do for your health. Most adults should get at least 150 minutes of moderate-intensity exercise (any activity that increases your heart rate and causes you to sweat) each week. In addition, most adults need muscle-strengthening exercises on 2 or more days a week.   Maintain a healthy weight. The body mass index (BMI) is a screening tool to identify possible weight problems. It provides an estimate of body fat based on height and weight. Your caregiver can help determine  your BMI, and can help you achieve or maintain a healthy weight. For adults 20 years and older:  A BMI below 18.5 is considered underweight.  A BMI of 18.5 to 24.9 is normal.  A BMI of 25 to 29.9 is considered overweight.  A BMI of 30 and above is considered obese.

## 2014-05-18 NOTE — Assessment & Plan Note (Addendum)
Pt reports CP that started a week ago while sweeping the floor.  She describes the pain as sharp, lasting 2-3 minutes, radiating into her left shoulder.  She had some associated diaphoresis, lightheadedness, weakness, no N/V.  When states it got better when she sat down.  Also reports some "fluttering" that is constantly.  She reports arguing more frequently with her children who live with her that also makes the pain worse.  No relation to food.  Reports pain does not feel like GERD.  No alcohol or recreational drug use.  On exam, she has tenderness to palpation in the left chest area.  Still smoking 1-2 cig/day.  -EKG, stat troponin now -continue ASA 81mg   -check lipid profile  -increase carvedilol to 12.5mg  bid  -instructed pt to go to call 911 and proceed to the nearest ED if she continues to have chest pain  -referral to cardiology, Dr. Shirlee Latch   ADDENDUM: Troponin neg, EKG without acute changes

## 2014-05-18 NOTE — Assessment & Plan Note (Signed)
BP Readings from Last 3 Encounters:  05/18/14 142/76  01/26/14 134/79  12/01/13 179/79    Lab Results  Component Value Date   NA 139 09/22/2013   K 3.8 09/22/2013   CREATININE 0.76 09/22/2013    Assessment: Blood pressure control: mildly elevated Progress toward BP goal:  deteriorated Comments: pt reports compliance with medications   Plan: Medications:  continue current medications of lisinopril 5mg  daily, increase carvedilol to 12.5mg  bid  Other plans: counseled on the importance of smoking cessation

## 2014-05-18 NOTE — Progress Notes (Signed)
[name], female   DOB: 08-10-1961, 53 y.o.   MRN: [id number]    Subjective:   [name] female    DOB: August 10, 1961 53 y.o.    MRN: [id number] Health Maintenance Due: There are no preventive care reminders to display for this patient.  _________________________________________________  HPI: [name] is a 53 y.o. female here for a routine visit.  Pt has a PMH outlined below.  Please see problem-based charting assessment and plan note for further details of medical issues addressed at today's visit.  PMH: Past Medical History  Diagnosis Date  . Rectal bleeding 2007    internal hemmorhoids by colonoscopy in 04/2006, Bx neg for IBD  . Duodenitis     secondary to H pylori(treatment completed 05/2006), +/- NSAIDS  . Alcoholism   . History of cocaine abuse   . PE 05/16/2009    CT angio positive for PE in 07/10, treated with coumadin for 8 months.   . Crack cocaine use   . ETOH abuse   . Anxiety   . Depression   . Pulmonary embolism     a. 04/2009 - treated with coumadin x 8 mos.  . DVT (deep venous thrombosis)     a. 04/2009 - treated with coumadin x 8 mos.  . Rectal bleeding     a. int hemorrhoids by colonoscopy in 04/2006, Bx neg for IBD  . Duodenitis     a. 05/2006 2/2 H pylori +/- NSAIDS  . History of CVA (cerebrovascular accident)   . Tobacco abuse   . Chest pain     a. 04/2009 Echo: EF 55-60%, Gr1 DD, Mild MR.  Marland Kitchen Hypertension   . Heart murmur   . Stroke   . Seizures   . Anemia   . ANEMIA, VITAMIN B12 DEFICIENCY 11/11/2007    Documented low 175 on 09/2007; received 6 yrs of IM therapy; switched to po 05/2013     Medications: Current Outpatient Prescriptions on File Prior to Visit  Medication Sig Dispense Refill  . aspirin EC 81 MG tablet Take 81 mg by mouth daily.      . Cholecalciferol 1000 UNITS capsule Take 1 capsule (1,000 Units total) by mouth daily.  30 capsule  12  . Cyanocobalamin (VITAMIN B 12 PO) Take 1,000 mcg by  mouth daily.      . famotidine (PEPCID) 20 MG tablet Take 1 tablet (20 mg total) by mouth 2 (two) times daily.  60 tablet  6  . lisinopril (PRINIVIL,ZESTRIL) 5 MG tablet Take 1 tablet (5 mg total) by mouth daily.  30 tablet  6  . thiamine 100 MG tablet Take 1 tablet (100 mg total) by mouth daily.  30 tablet  12  . venlafaxine XR (EFFEXOR-XR) 75 MG 24 hr capsule Take 1 capsule (75 mg total) by mouth daily. For hot flashes  30 capsule  2   No current facility-administered medications on file prior to visit.    Allergies: Allergies  Allergen Reactions  . Omnipaque [Iohexol] Other (See Comments)    Seizure   . Omnipaque [Iohexol] Other (See Comments)    Seizure   . Iodinated Diagnostic Agents     FH: Family History  Problem Relation Age of Onset  . Coronary artery disease      1 st degree female relative <60 and female <50  . Diabetes type II      1 st degree relative  . Breast cancer Mother   .  Heart disease Mother   . Heart disease Father   . Heart disease Brother   . Breast cancer      1st degree relative <50  . Diabetes Mother   . CAD Mother   . Colon cancer Father   . CAD Father   . Breast cancer Sister   . Diabetes Sister   . Diabetes Brother   . CAD Brother     SH: History   Social History  . Marital Status: Single    Spouse Name: N/A    Number of Children: 2  . Years of Education: N/A   Occupational History  . Unemployed    Social History Main Topics  . Smoking status: Current Every Day Smoker -- 0.10 packs/day    Types: Cigarettes    Last Attempt to Quit: 01/16/2013  . Smokeless tobacco: Never Used     Comment: smokes about 1-2 cigs/day  . Alcohol Use: Yes     Comment: beer  . Drug Use: No     Comment: former  . Sexual Activity: None   Other Topics Concern  . None   Social History Narrative   ** Merged History Encounter **       ** Data from: 12/05/12 Enc Dept: [facility]   No cocaine or alcohol use since hospital discharge  05/19/2009.      10/13 update:  States she is smoking 1-2 cigarettes daily with at least 1 drink daily usually Brandy.                  ** Data from: [facility]17/13 [id number]   [place]          Review of Systems: Constitutional: Negative for fever, chills and weight loss.  Eyes: Negative for blurred vision.  Respiratory: Negative for cough and shortness of breath.  Cardiovascular: +chest pain, +palpitations and-leg swelling.  Gastrointestinal: Negative for nausea, vomiting, abdominal pain, diarrhea, constipation and blood in stool.  Genitourinary: Negative for dysuria, urgency and frequency.  Musculoskeletal: Negative for myalgias and back pain.  Neurological: Negative for dizziness, weakness and headaches.     Objective:   Vital Signs: Filed Vitals:   05/18/14 1353  BP: 142/76  Pulse: 92  Temp: 97.9 F (36.6 C)  TempSrc: Oral  Height: 5\' 4"  (1.626 m)  Weight: 131 lb 6.4 oz (59.603 kg)  SpO2: 100%      BP Readings from Last 3 Encounters:  05/18/14 142/76  01/26/14 134/79  12/01/13 179/79   Physical Exam: Constitutional: Vital signs reviewed.  Patient is well-developed and well-nourished in NAD and cooperative with exam.  Head: Normocephalic and atraumatic. Eyes: PERRL, EOMI, conjunctivae nl, no scleral icterus.  Neck: Supple. Cardiovascular: RRR, no MRG. Pulmonary/Chest: normal effort, left upper chest tender to palpation, CTAB, no wheezes, rales, or rhonchi. Abdominal: Soft. NT/ND +BS. Neurological: A&O x3, cranial nerves II-XII are grossly intact, moving all extremities. Extremities: 2+DP b/l; no pitting edema. Skin: Warm, dry and intact. No rash.   Assessment & Plan:   Assessment and plan was discussed and formulated with my attending.

## 2014-05-18 NOTE — Assessment & Plan Note (Signed)
Improved. -continue tylenol as needed

## 2014-05-18 NOTE — Assessment & Plan Note (Signed)
Pt reports not taking Vitamin D as instructed, but states she will get some. -Vit D 1000 units daily

## 2014-05-19 NOTE — Telephone Encounter (Signed)
A user error has taken place: encounter opened in error, closed for administrative reasons.

## 2014-05-19 NOTE — Progress Notes (Signed)
INTERNAL MEDICINE TEACHING ATTENDING ADDENDUM - Maysin Carstens, MD: I reviewed and discussed at the time of visit with the resident Dr. Gill, the patient's medical history, physical examination, diagnosis and results of pertinent tests and treatment and I agree with the patient's care as documented.  

## 2014-05-29 IMAGING — CR DG CHEST 1V PORT
1 series · 1 of 1 positions shown · non-contrast
Comparison: Yesterday

CLINICAL DATA: Respiratory failure

PORTABLE CHEST - 1 VIEW

[AP]
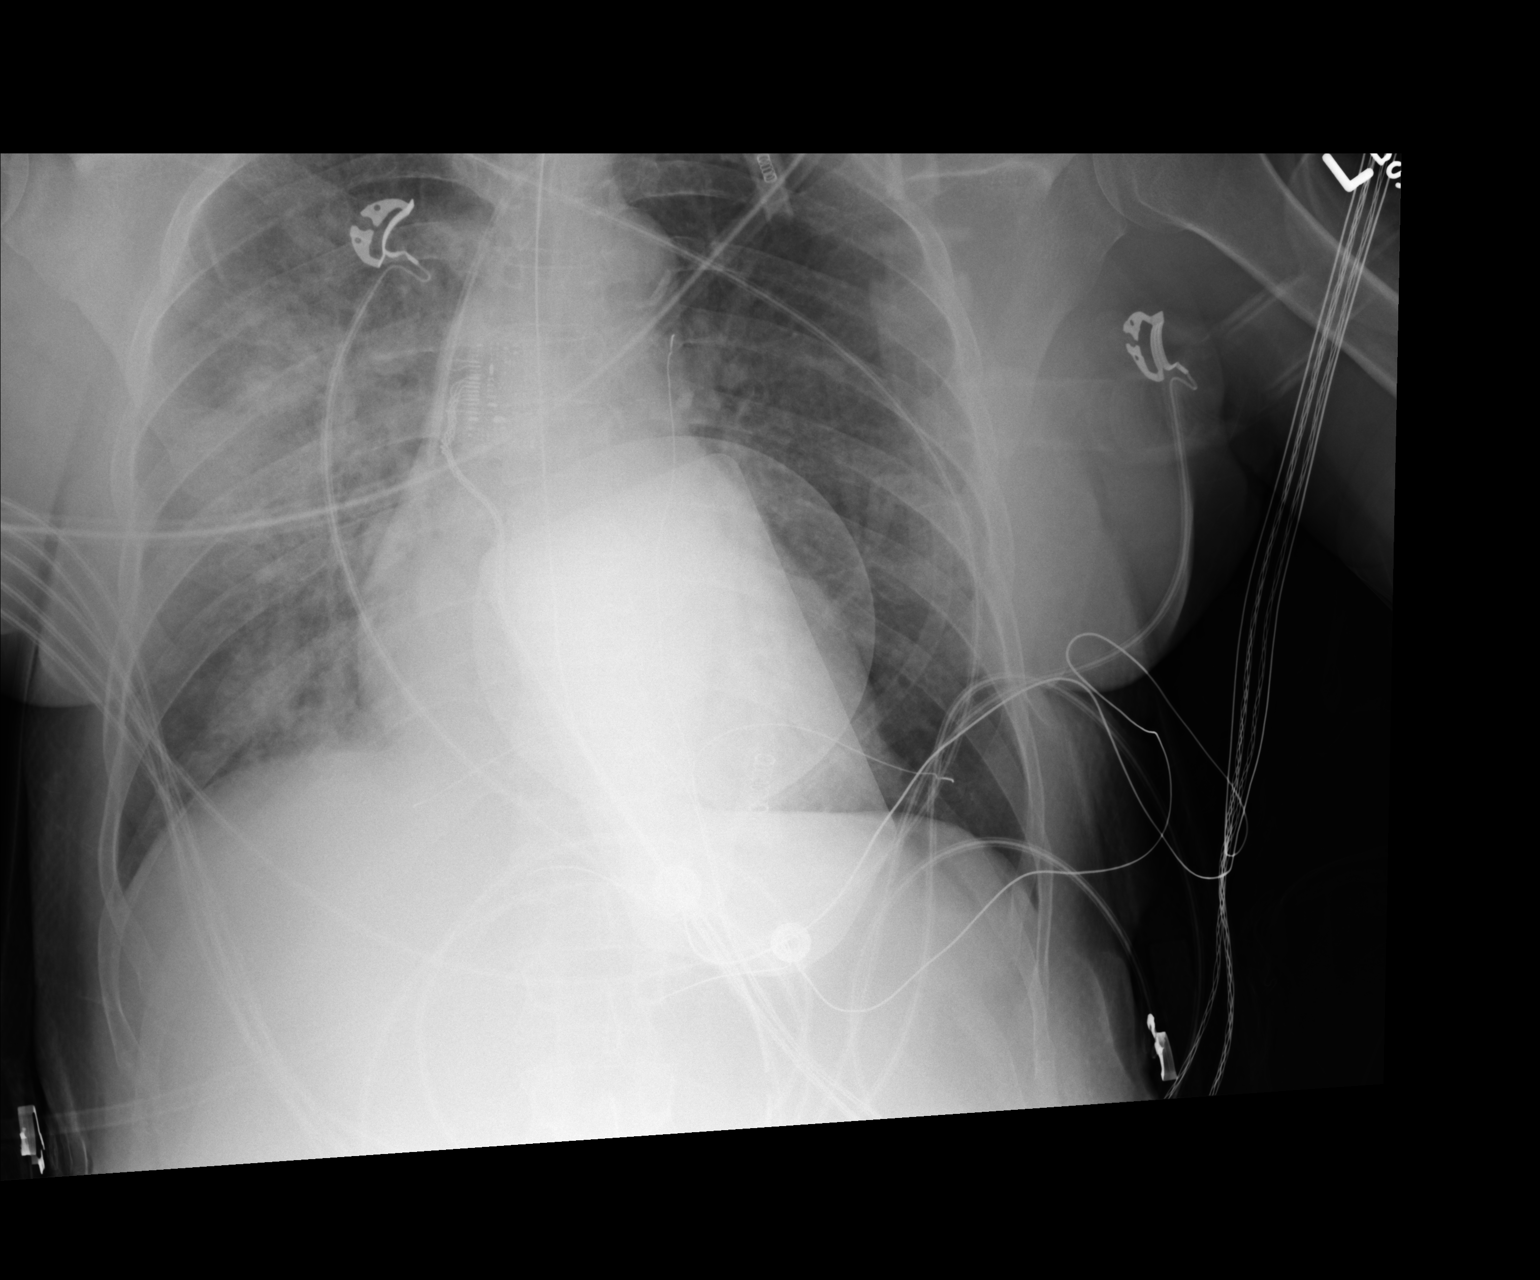

[1 of 1 positions shown; findings below may reference images not displayed]

FINDINGS: Tubular structures stable.  Bilateral consolidation right
greater than left improved.  Normal heart size.  No pneumothorax.
IMPRESSION: Improved airspace disease.

## 2014-05-31 IMAGING — CR DG CHEST 1V PORT
1 series · 1 of 1 positions shown · non-contrast
Comparison: 01/17/2013

CLINICAL DATA: Respiratory failure.

PORTABLE CHEST - 1 VIEW

[AP]
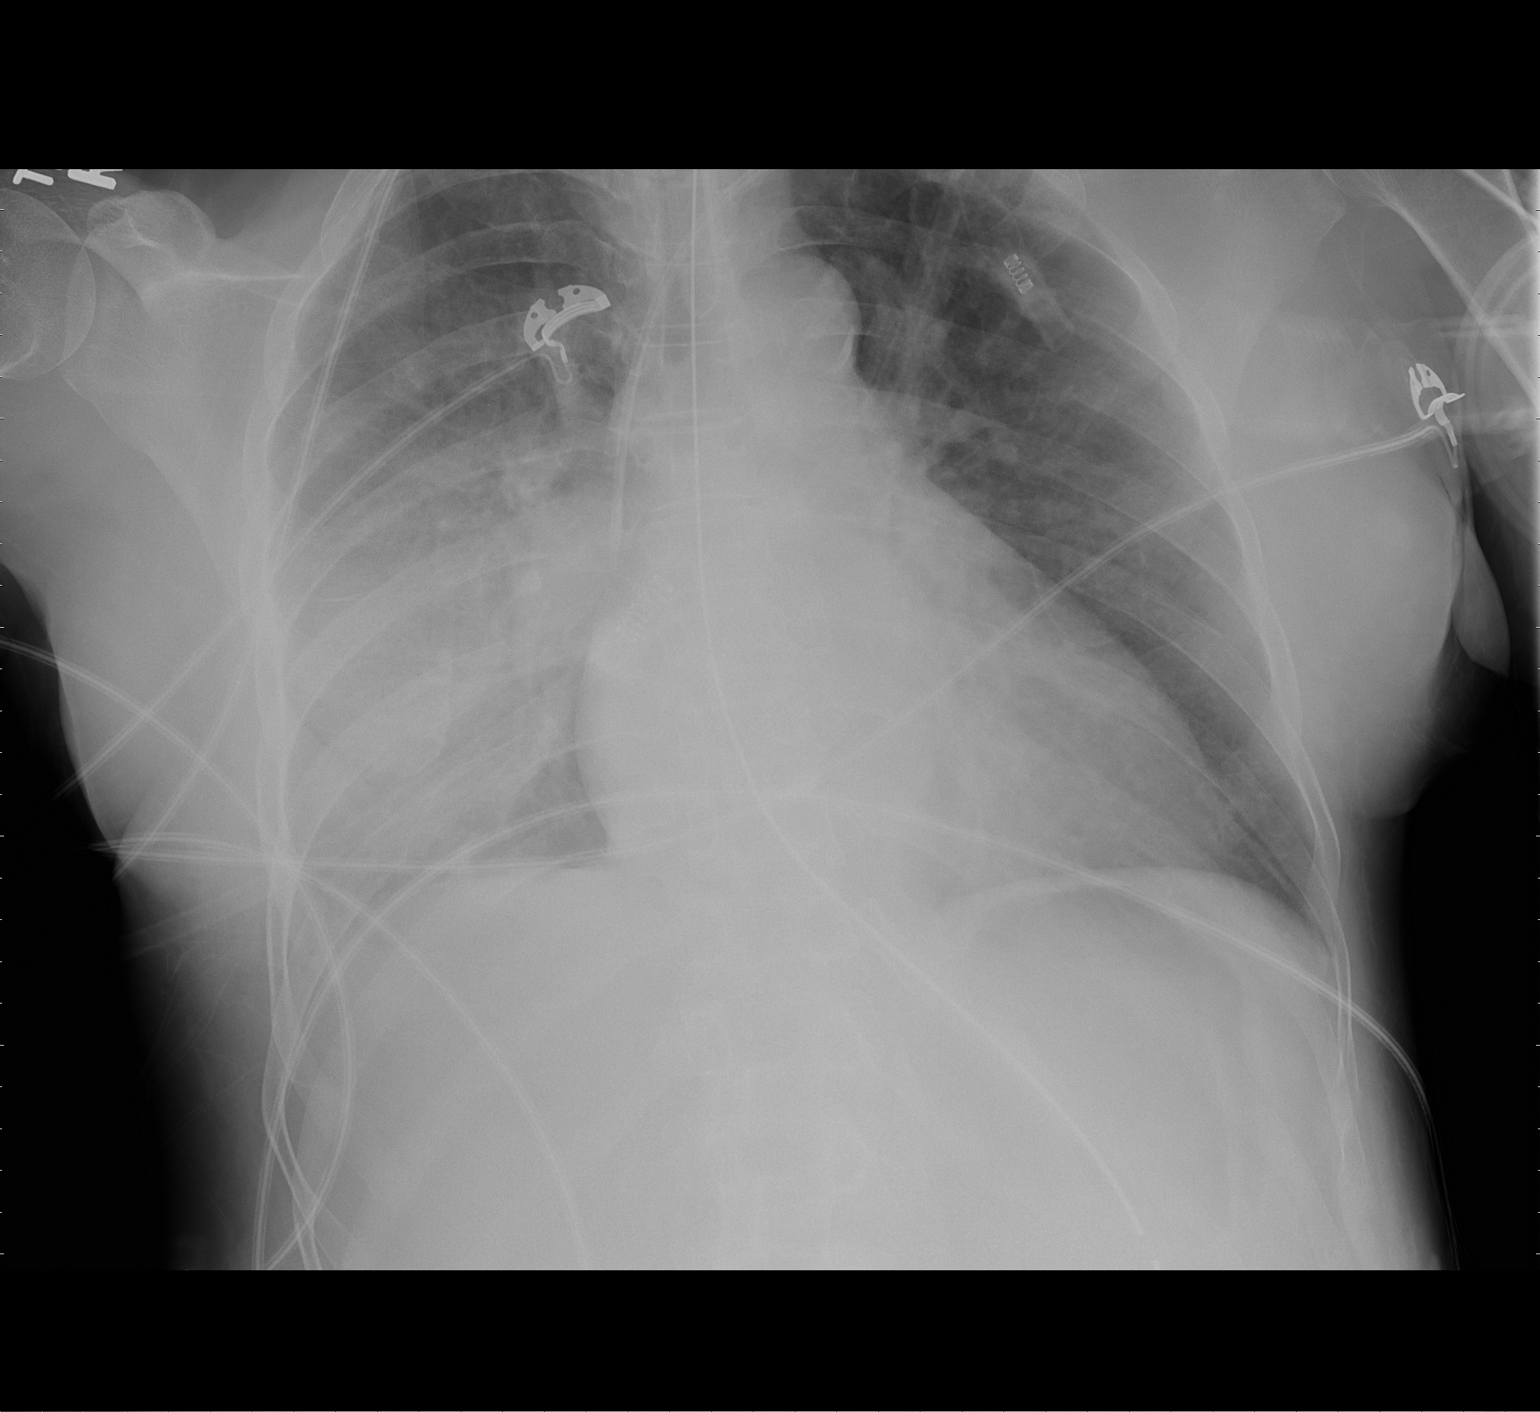

[1 of 1 positions shown; findings below may reference images not displayed]

FINDINGS: Endotracheal tube, nasogastric tube, and left subclavian
central line are stable in position.  Perihilar interstitial and
airspace edema or infiltrates, right greater than left, not
significantly changed since previous exam.  There is blunting of
the right lateral costophrenic angle suggesting small effusion.
Heart size upper limits normal for technique.
IMPRESSION: 1.  Stable asymmetric edema/infiltrates.
2. Support hardware stable in position.

## 2014-06-01 ENCOUNTER — Emergency Department (HOSPITAL_COMMUNITY)
Admission: EM | Admit: 2014-06-01 | Discharge: 2014-06-01 | Disposition: A | Discharge status: Home / Self Care | Payer: No Typology Code available for payment source | Attending: Emergency Medicine | Admitting: Emergency Medicine

## 2014-06-01 ENCOUNTER — Encounter (HOSPITAL_COMMUNITY): Payer: Self-pay | Admitting: Emergency Medicine

## 2014-06-01 DIAGNOSIS — Z79899 Other long term (current) drug therapy: Secondary | ICD-10-CM | POA: Insufficient documentation

## 2014-06-01 DIAGNOSIS — Z8673 Personal history of transient ischemic attack (TIA), and cerebral infarction without residual deficits: Secondary | ICD-10-CM | POA: Insufficient documentation

## 2014-06-01 DIAGNOSIS — Z7982 Long term (current) use of aspirin: Secondary | ICD-10-CM | POA: Insufficient documentation

## 2014-06-01 DIAGNOSIS — R42 Dizziness and giddiness: Secondary | ICD-10-CM | POA: Insufficient documentation

## 2014-06-01 DIAGNOSIS — R011 Cardiac murmur, unspecified: Secondary | ICD-10-CM | POA: Insufficient documentation

## 2014-06-01 DIAGNOSIS — D518 Other vitamin B12 deficiency anemias: Secondary | ICD-10-CM | POA: Insufficient documentation

## 2014-06-01 DIAGNOSIS — I10 Essential (primary) hypertension: Secondary | ICD-10-CM | POA: Insufficient documentation

## 2014-06-01 DIAGNOSIS — F329 Major depressive disorder, single episode, unspecified: Secondary | ICD-10-CM | POA: Insufficient documentation

## 2014-06-01 DIAGNOSIS — N390 Urinary tract infection, site not specified: Secondary | ICD-10-CM

## 2014-06-01 DIAGNOSIS — R109 Unspecified abdominal pain: Secondary | ICD-10-CM | POA: Insufficient documentation

## 2014-06-01 DIAGNOSIS — K921 Melena: Secondary | ICD-10-CM | POA: Insufficient documentation

## 2014-06-01 DIAGNOSIS — F1021 Alcohol dependence, in remission: Secondary | ICD-10-CM | POA: Insufficient documentation

## 2014-06-01 DIAGNOSIS — R3589 Other polyuria: Secondary | ICD-10-CM | POA: Insufficient documentation

## 2014-06-01 DIAGNOSIS — F17200 Nicotine dependence, unspecified, uncomplicated: Secondary | ICD-10-CM | POA: Insufficient documentation

## 2014-06-01 DIAGNOSIS — K625 Hemorrhage of anus and rectum: Secondary | ICD-10-CM

## 2014-06-01 DIAGNOSIS — F3289 Other specified depressive episodes: Secondary | ICD-10-CM | POA: Insufficient documentation

## 2014-06-01 DIAGNOSIS — Z86718 Personal history of other venous thrombosis and embolism: Secondary | ICD-10-CM | POA: Insufficient documentation

## 2014-06-01 DIAGNOSIS — Z86711 Personal history of pulmonary embolism: Secondary | ICD-10-CM | POA: Insufficient documentation

## 2014-06-01 DIAGNOSIS — F411 Generalized anxiety disorder: Secondary | ICD-10-CM | POA: Insufficient documentation

## 2014-06-01 DIAGNOSIS — R358 Other polyuria: Secondary | ICD-10-CM | POA: Insufficient documentation

## 2014-06-01 LAB — COMPREHENSIVE METABOLIC PANEL
ALT: 29 U/L (ref 0–35)
ANION GAP: 22 — AB (ref 5–15)
AST: 87 U/L — ABNORMAL HIGH (ref 0–37)
Albumin: 3.9 g/dL (ref 3.5–5.2)
Alkaline Phosphatase: 79 U/L (ref 39–117)
BUN: 10 mg/dL (ref 6–23)
CALCIUM: 8.7 mg/dL (ref 8.4–10.5)
CO2: 20 meq/L (ref 19–32)
CREATININE: 0.62 mg/dL (ref 0.50–1.10)
Chloride: 100 mEq/L (ref 96–112)
GFR calc Af Amer: >90 mL/min (ref >90)
GLUCOSE: 76 mg/dL (ref 70–99)
Potassium: 3.5 mEq/L — ABNORMAL LOW (ref 3.7–5.3)
Sodium: 142 mEq/L (ref 137–147)
TOTAL PROTEIN: 7.4 g/dL (ref 6.0–8.3)
Total Bilirubin: 0.7 mg/dL (ref 0.3–1.2)

## 2014-06-01 LAB — URINE MICROSCOPIC-ADD ON

## 2014-06-01 LAB — CBC
HEMATOCRIT: 34.2 % — AB (ref 36.0–46.0)
HEMOGLOBIN: 11.3 g/dL — AB (ref 12.0–15.0)
MCH: 31 pg (ref 26.0–34.0)
MCHC: 33 g/dL (ref 30.0–36.0)
MCV: 93.7 fL (ref 78.0–100.0)
Platelets: 238 10*3/uL (ref 150–400)
RBC: 3.65 MIL/uL — AB (ref 3.87–5.11)
RDW: 15.3 % (ref 11.5–15.5)
WBC: 4.4 10*3/uL (ref 4.0–10.5)

## 2014-06-01 LAB — URINALYSIS, ROUTINE W REFLEX MICROSCOPIC
Glucose, UA: NEGATIVE mg/dL
Ketones, ur: 15 mg/dL — AB
Leukocytes, UA: NEGATIVE
NITRITE: NEGATIVE
PH: 5.5 (ref 5.0–8.0)
Protein, ur: NEGATIVE mg/dL
Specific Gravity, Urine: 1.026 (ref 1.005–1.030)
UROBILINOGEN UA: 1 mg/dL (ref 0.0–1.0)

## 2014-06-01 LAB — LIPASE, BLOOD: Lipase: 27 U/L (ref 11–59)

## 2014-06-01 LAB — POC OCCULT BLOOD, ED: Fecal Occult Bld: NEGATIVE

## 2014-06-01 MED ORDER — CEPHALEXIN 500 MG PO CAPS: 500.0000 mg | ORAL_CAPSULE | Freq: Three times a day (TID) | ORAL | Status: DC

## 2014-06-01 NOTE — Discharge Instructions (Signed)
Your blood was likely from your hemorrhoids. You should scheduled follow-up with GI within the next week. You also had blood and bacteria on your urine sample. You should take Keflex 1 tab three times a day for five days. Please also schedule a follow-up appointment with your primary care provider within 1 week.

## 2014-06-01 NOTE — ED Provider Notes (Signed)
CSN: [id number]     Arrival date & time 06/01/14  9038 History   First MD Initiated Contact with Patient 06/01/14 [phone]     Chief Complaint  Patient presents with  . Abdominal Pain  . Rectal Bleeding   Ms. Guzzo is a 53 year old woman w PMH duodenitis (H pylori treated in 2007), internal hemorrhoids, nonischemic cardiomyopathy who presents with bright red blood in her stool this morning. She said she had a normal bowel movement this morning and noticed bright red blood on her toilet paper and in the toilet. She also has an intermittent throbbing lower abdominal pain that she rates 9/10. Her last colonoscopy was in 2007 and does not remember the results. She notes a general lightheadedness. She reports polyuria but denies dysuria. Nothing makes it better but she thinks standing makes it worse. She denies nausea, emesis, subjective fever, change in appetite, or presyncope.  (Consider location/radiation/quality/duration/timing/severity/associated sxs/prior Treatment) Patient is a 53 y.o. female presenting with abdominal pain and hematochezia.  Abdominal Pain Associated symptoms: hematochezia   Associated symptoms: no chest pain, no diarrhea, no dysuria, no fever, no nausea, no shortness of breath and no vomiting   Rectal Bleeding Associated symptoms: abdominal pain and light-headedness   Associated symptoms: no fever and no vomiting     Past Medical History  Diagnosis Date  . Rectal bleeding 2007    internal hemmorhoids by colonoscopy in 04/2006, Bx neg for IBD  . Duodenitis     secondary to H pylori(treatment completed 05/2006), +/- NSAIDS  . Alcoholism   . History of cocaine abuse   . PE 05/16/2009    CT angio positive for PE in 07/10, treated with coumadin for 8 months.   Marland Kitchen ETOH abuse   . Anxiety   . Depression   . Pulmonary embolism     a. 04/2009 - treated with coumadin x 8 mos.  . DVT (deep venous thrombosis)     a. 04/2009 - treated with coumadin x 8 mos.  . Rectal bleeding     a.  int hemorrhoids by colonoscopy in 04/2006, Bx neg for IBD  . Duodenitis     a. 05/2006 2/2 H pylori +/- NSAIDS  . History of CVA (cerebrovascular accident)   . Tobacco abuse   . Chest pain     a. 04/2009 Echo: EF 55-60%, Gr1 DD, Mild MR.  Marland Kitchen Hypertension   . Heart murmur   . Stroke   . Seizures   . ANEMIA, VITAMIN B12 DEFICIENCY 11/11/2007    Documented low 175 on 09/2007; received 6 yrs of IM therapy; switched to po 05/2013    Past Surgical History  Procedure Laterality Date  . Cesarean section    . Cardiac catheterization     Family History  Problem Relation Age of Onset  . Coronary artery disease      1 st degree female relative <60 and female <50  . Diabetes type II      1 st degree relative  . Breast cancer Mother   . Heart disease Mother   . Heart disease Father   . Heart disease Brother   . Breast cancer      1st degree relative <50  . Diabetes Mother   . CAD Mother   . Colon cancer Father   . CAD Father   . Breast cancer Sister   . Diabetes Sister   . Diabetes Brother   . CAD Brother    History  Substance Use  Topics  . Smoking status: Current Every Day Smoker -- 0.10 packs/day    Types: Cigarettes    Last Attempt to Quit: 01/16/2013  . Smokeless tobacco: Never Used     Comment: smokes about 1-2 cigs/day  . Alcohol Use: Yes     Comment: beer   OB History   Grav Para Term Preterm Abortions TAB SAB Ect Mult Living                 Review of Systems  Constitutional: Positive for unexpected weight change. Negative for fever and appetite change.  Respiratory: Negative for chest tightness and shortness of breath.   Cardiovascular: Negative for chest pain.  Gastrointestinal: Positive for abdominal pain, blood in stool and hematochezia. Negative for nausea, vomiting and diarrhea.  Endocrine: Positive for polyuria.  Genitourinary: Negative for dysuria.  Neurological: Positive for weakness and light-headedness. Negative for syncope.      Allergies  Iodinated  diagnostic agents; Omnipaque; and Omnipaque  Home Medications   Prior to Admission medications   Medication Sig Start Date End Date Taking? Authorizing Provider  aspirin EC 81 MG tablet Take 81 mg by mouth daily.   Yes Historical Provider, MD  carvedilol (COREG) 12.5 MG tablet Take 1 tablet (12.5 mg total) by mouth 2 (two) times daily. 05/18/14 05/18/15 Yes Marrian Salvage, MD  Cholecalciferol 1000 UNITS capsule Take 1 capsule (1,000 Units total) by mouth daily. 11/03/13  Yes Marrian Salvage, MD  famotidine (PEPCID) 20 MG tablet Take 1 tablet (20 mg total) by mouth 2 (two) times daily. 02/28/13  Yes Sunday Spillers, MD  folic acid (FOLVITE) 1 MG tablet Take 1 mg by mouth daily.   Yes Historical Provider, MD  lisinopril-hydrochlorothiazide (PRINZIDE,ZESTORETIC) 10-12.5 MG per tablet Take 1 tablet by mouth daily.   Yes Historical Provider, MD  loratadine (CLARITIN) 10 MG tablet Take 10 mg by mouth daily as needed for allergies.    Yes Historical Provider, MD  thiamine 100 MG tablet Take 1 tablet (100 mg total) by mouth daily. 03/30/14  Yes Marrian Salvage, MD  venlafaxine XR (EFFEXOR-XR) 75 MG 24 hr capsule Take 1 capsule (75 mg total) by mouth daily. For hot flashes   Yes Marrian Salvage, MD  vitamin B-12 (CYANOCOBALAMIN) 1000 MCG tablet Take 1,000 mcg by mouth daily.   Yes Historical Provider, MD   BP 118/89  Pulse 85  Temp(Src) 98.6 F (37 C) (Oral)  Resp 16  SpO2 98%  LMP 11/02/2010 Physical Exam  Constitutional: She is oriented to person, place, and time. She appears well-developed and well-nourished. No distress.  HENT:  Mouth/Throat: Oropharynx is clear and moist. No oropharyngeal exudate.  Eyes: EOM are normal. Pupils are equal, round, and reactive to light.  Cardiovascular: Normal rate, regular rhythm and normal heart sounds.  Exam reveals no gallop and no friction rub.   No murmur heard. Pulmonary/Chest: Effort normal and breath sounds normal. No respiratory distress.  Abdominal:  Soft. Bowel sounds are normal. She exhibits no distension and no mass. There is tenderness. There is no rebound and no guarding.  LLQ and RLQ tenderness, not suprapubic  Genitourinary: Vagina normal. Guaiac negative stool. No tenderness or bleeding around the vagina. No foreign body around the vagina. No signs of injury around the vagina. No vaginal discharge found.  No external hemorrhoids  Neurological: She is alert and oriented to person, place, and time.  Skin: She is not diaphoretic.    ED Course  Procedures (including critical  care time) Labs Review Labs Reviewed  CBC - Abnormal; Notable for the following:    RBC 3.65 (*)    Hemoglobin 11.3 (*)    HCT 34.2 (*)    All other components within normal limits  COMPREHENSIVE METABOLIC PANEL - Abnormal; Notable for the following:    Potassium 3.5 (*)    AST 87 (*)    Anion gap 22 (*)    All other components within normal limits  URINALYSIS, ROUTINE W REFLEX MICROSCOPIC - Abnormal; Notable for the following:    Color, Urine AMBER (*)    APPearance HAZY (*)    Hgb urine dipstick LARGE (*)    Bilirubin Urine SMALL (*)    Ketones, ur 15 (*)    All other components within normal limits  URINE MICROSCOPIC-ADD ON - Abnormal; Notable for the following:    Squamous Epithelial / LPF MANY (*)    Bacteria, UA MANY (*)    All other components within normal limits  URINE CULTURE  LIPASE, BLOOD  POC OCCULT BLOOD, ED   Basic Metabolic Panel:  Recent Labs  16/07/9607/03/15 0942  NA 142  K 3.5*  CL 100  CO2 20  GLUCOSE 76  BUN 10  CREATININE 0.62  CALCIUM 8.7   Liver Function Tests:  Recent Labs  06/01/14 0942  AST 87*  ALT 29  ALKPHOS 79  BILITOT 0.7  PROT 7.4  ALBUMIN 3.9    Recent Labs  06/01/14 0942  LIPASE 27   No results found for this basename: AMMONIA,  in the last 72 hours CBC:  Recent Labs  06/01/14 0942  WBC 4.4  HGB 11.3*  HCT 34.2*  MCV 93.7  PLT 238   Urinalysis:  Recent Labs  06/01/14 1131   COLORURINE AMBER*  LABSPEC 1.026  PHURINE 5.5  GLUCOSEU NEGATIVE  HGBUR LARGE*  BILIRUBINUR SMALL*  KETONESUR 15*  PROTEINUR NEGATIVE  UROBILINOGEN 1.0  NITRITE NEGATIVE  LEUKOCYTESUR NEGATIVE   Misc. Labs: [facility] negative  Imaging Review No results found.   EKG Interpretation None      MDM   Final diagnoses:  BRBPR (bright red blood per rectum)  UTI (urinary tract infection), uncomplicated    #BRBPR: Patient reported bright red blood on toilet paper and in toilet bowl today. May be secondary to hemorrhoids vs fissure. She was guaiac negative with no external hemorrhoids on exam but internal hemorrhoids noted in EMR. Exam otherwise notable for diffuse lower abdominal tenderness. Afebrile with no tachycardia. Hemoglobin 11.3 is above baseline of 10. Not actively bleeding. Last colonoscopy 2007 so would benefit from GI outpatient follow-up. This may also be related to hematuria although pelvic exam was negative for gross blood. -f/u with GI outpatient  #Polyuria: Patient reports polyuria without dysuria. Her UA was notable for large hemoglobin and her microscopy had many bacteria. She will be treated as an uncomplicated UTI. UCx pending -f/u with PCP in 1 week -f/u UCx -keflex 500 TID x 5 days  Lorenda HatchetAdam L Jariyah Hackley, MD 06/01/14 1343

## 2014-06-01 NOTE — ED Provider Notes (Signed)
I saw and evaluated the patient, reviewed the resident's note and I agree with the findings and plan.pt c/o brbpr, painless. No hx gi bleeding. No melena. Denies abd pain. No constipation or straining. No rectal pain or trauma. No vaginal bleeding. No hematuria. abd soft nt. Rectal heme neg, no bleeding. No vaginal bleeding. ua w some bact, rbc, few wbc, will cx and rx. Gi and pcp follow up.       Suzi Roots, MD 06/01/14 [phone]

## 2014-06-01 NOTE — Progress Notes (Signed)
[facility] Community Liaison [place],  [place] with patient about [employer]. CL scheduled patient an apt with PCP-[facility] on 8/4 at 2:15 pm. CL will refer patient to [facility] Management for f/u.

## 2014-06-01 NOTE — ED Notes (Signed)
Pt c/o lower abd pain x several days; pt sts some BRB on toilet paper this am; pt sts some frequent urination and denies vomiting

## 2014-06-02 ENCOUNTER — Ambulatory Visit (INDEPENDENT_AMBULATORY_CARE_PROVIDER_SITE_OTHER): Payer: No Typology Code available for payment source | Admitting: Internal Medicine

## 2014-06-02 ENCOUNTER — Encounter: Payer: Self-pay | Admitting: Internal Medicine

## 2014-06-02 VITALS — BP 137/90 | HR 75 | Temp 97.5°F | Ht 64.5 in | Wt 127.7 lb

## 2014-06-02 DIAGNOSIS — N3000 Acute cystitis without hematuria: Secondary | ICD-10-CM

## 2014-06-02 DIAGNOSIS — N3001 Acute cystitis with hematuria: Secondary | ICD-10-CM

## 2014-06-02 DIAGNOSIS — I10 Essential (primary) hypertension: Secondary | ICD-10-CM

## 2014-06-02 DIAGNOSIS — K219 Gastro-esophageal reflux disease without esophagitis: Secondary | ICD-10-CM

## 2014-06-02 DIAGNOSIS — I428 Other cardiomyopathies: Secondary | ICD-10-CM

## 2014-06-02 DIAGNOSIS — N3091 Cystitis, unspecified with hematuria: Secondary | ICD-10-CM | POA: Insufficient documentation

## 2014-06-02 LAB — URINE CULTURE

## 2014-06-02 IMAGING — CR DG CHEST 1V PORT
1 series · 1 of 1 positions shown · non-contrast
Comparison: 01/20/2013.

CLINICAL DATA: Endotracheal tube placement.

PORTABLE CHEST - 1 VIEW

[AP]
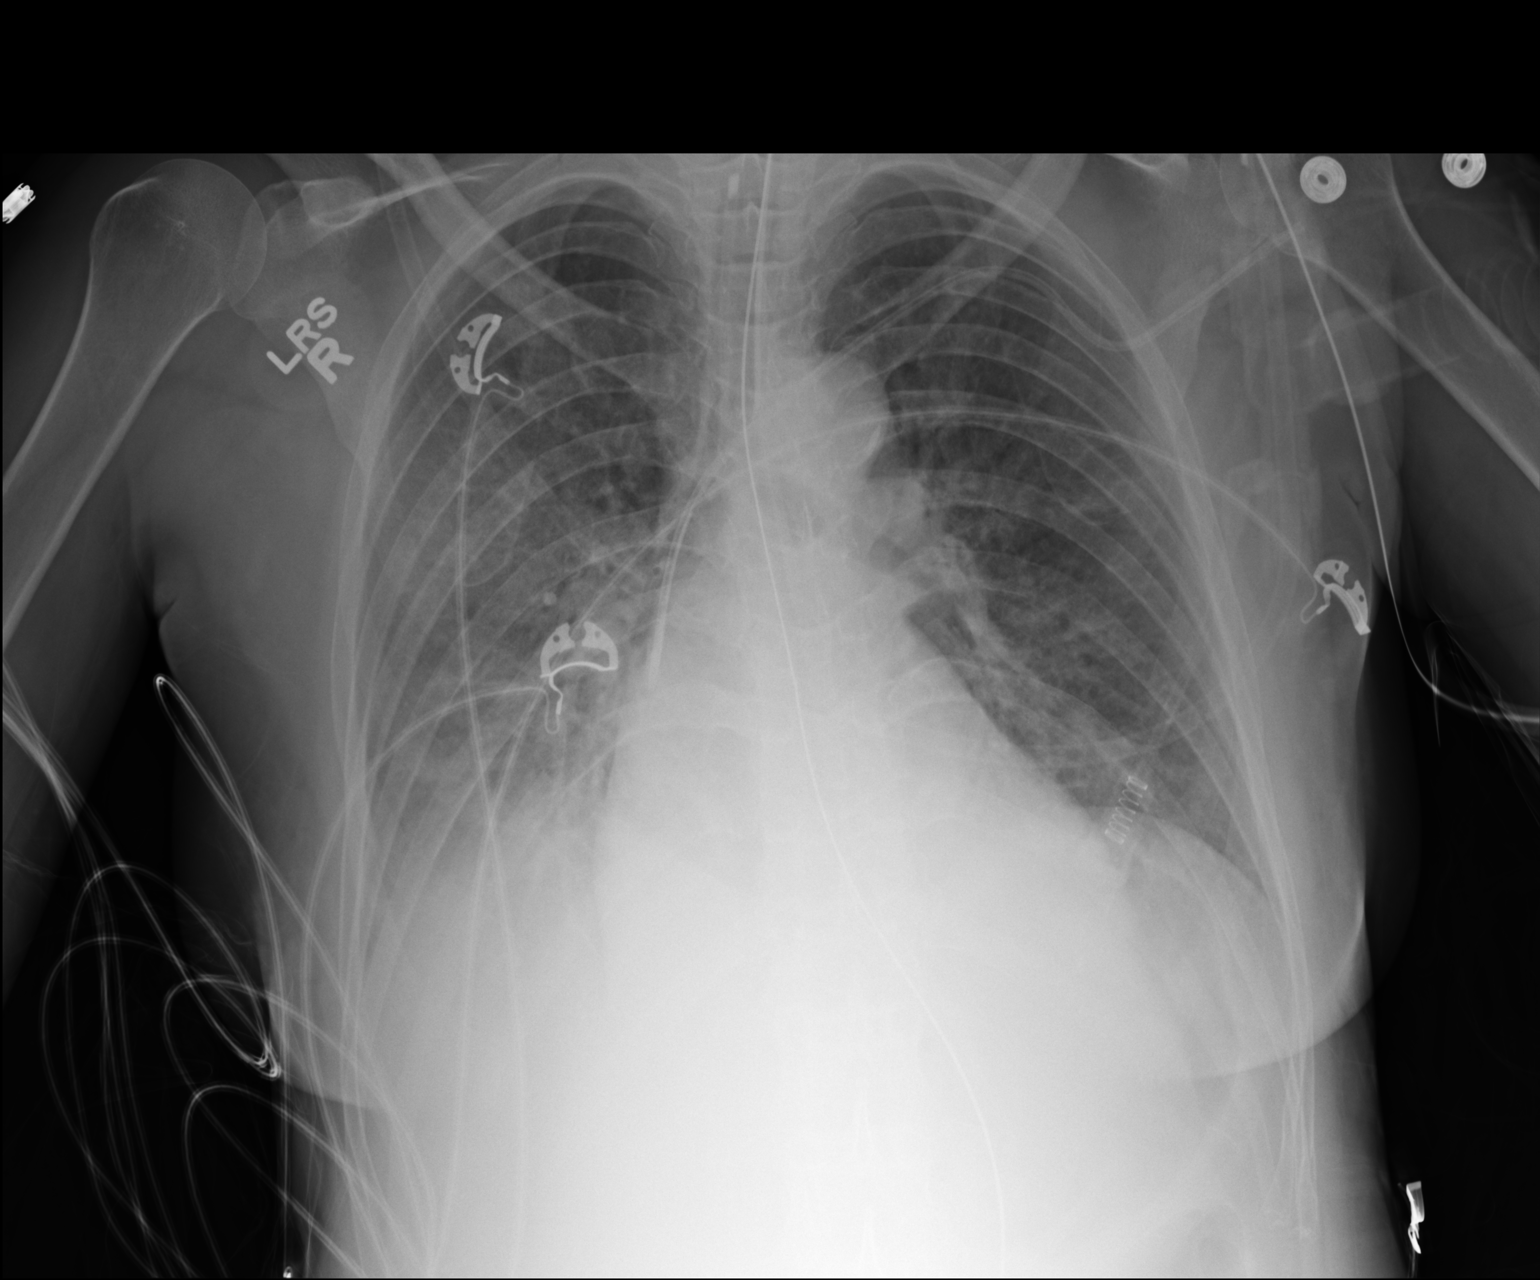

[1 of 1 positions shown; findings below may reference images not displayed]

FINDINGS: Endotracheal tube tip is 24 mm from the carina.  Enteric
tube and left subclavian central line are unchanged.  Pulmonary
aeration shows shifting airspace disease, now greater on the right
than left.  Right pleural effusion.  Bilateral basilar atelectasis.
Compared to prior, support apparatus and overall aeration is
roughly similar.
IMPRESSION: 1.Support apparatus as described above.  Endotracheal tube tip 24
mm from the carina.
2.  Shifting airspace disease with overall similar aeration
compared to prior.  Right pleural effusion.

## 2014-06-02 MED ORDER — LISINOPRIL-HYDROCHLOROTHIAZIDE 10-12.5 MG PO TABS: 1.0000 | ORAL_TABLET | Freq: Every day | ORAL | Status: DC

## 2014-06-02 MED ORDER — FAMOTIDINE 20 MG PO TABS: 20.0000 mg | ORAL_TABLET | Freq: Two times a day (BID) | ORAL | Status: DC

## 2014-06-02 MED ORDER — FOLIC ACID 1 MG PO TABS: 1.0000 mg | ORAL_TABLET | Freq: Every day | ORAL | Status: DC

## 2014-06-02 MED ORDER — PHENAZOPYRIDINE HCL 97.2 MG PO TABS: 97.0000 mg | ORAL_TABLET | Freq: Three times a day (TID) | ORAL | Status: DC | PRN

## 2014-06-02 NOTE — Patient Instructions (Signed)
Thank you for your visit today.  Please return to the internal medicine clinic in 3 month(s) or sooner if needed.    Please come to the lab next week to have your urine specimen recollected.  Call me if your symptoms do not improve.  I have given you a couple of days of pyridium and this may help the pain.    Your current medical regimen is effective;  continue present plan and take all medications as prescribed.    I have sent your medication refills to your pharmacy.   Please be sure to bring all of your medications with you to every visit; this includes herbal supplements, vitamins, eye drops, and any over-the-counter medications.   Should you have any questions regarding your medications and/or any new or worsening symptoms, please be sure to call the clinic at [phone].   If you believe that you are suffering from a life threatening condition or one that may result in the loss of limb or function, then you should call 911 or proceed to the nearest Emergency Department.    Smoking Cessation Quitting smoking is important to your health and has many advantages. However, it is not always easy to quit since nicotine is a very addictive drug. Oftentimes, people try 3 times or more before being able to quit. This document explains the best ways for you to prepare to quit smoking. Quitting takes hard work and a lot of effort, but you can do it. ADVANTAGES OF QUITTING SMOKING  You will live longer, feel better, and live better.  Your body will feel the impact of quitting smoking almost immediately.  Within 20 minutes, blood pressure decreases. Your pulse returns to its normal level.  After 8 hours, carbon monoxide levels in the blood return to normal. Your oxygen level increases.  After 24 hours, the chance of having a heart attack starts to decrease. Your breath, hair, and body stop smelling like smoke.  After 48 hours, damaged nerve endings begin to recover. Your sense of taste and  smell improve.  After 72 hours, the body is virtually free of nicotine. Your bronchial tubes relax and breathing becomes easier.  After 2 to 12 weeks, lungs can hold more air. Exercise becomes easier and circulation improves.  The risk of having a heart attack, stroke, cancer, or lung disease is greatly reduced.  After 1 year, the risk of coronary heart disease is cut in half.  After 5 years, the risk of stroke falls to the same as a nonsmoker.  After 10 years, the risk of lung cancer is cut in half and the risk of other cancers decreases significantly.  After 15 years, the risk of coronary heart disease drops, usually to the level of a nonsmoker.  If you are pregnant, quitting smoking will improve your chances of having a healthy baby.  The people you live with, especially any children, will be healthier.  You will have extra money to spend on things other than cigarettes. QUESTIONS TO THINK ABOUT BEFORE ATTEMPTING TO QUIT You may want to talk about your answers with your health care provider.  Why do you want to quit?  If you tried to quit in the past, what helped and what did not?  What will be the most difficult situations for you after you quit? How will you plan to handle them?  Who can help you through the tough times? Your family? Friends? A health care provider?  What pleasures do you get from smoking?  What ways can you still get pleasure if you quit? Here are some questions to ask your health care provider:  How can you help me to be successful at quitting?  What medicine do you think would be best for me and how should I take it?  What should I do if I need more help?  What is smoking withdrawal like? How can I get information on withdrawal? GET READY  Set a quit date.  Change your environment by getting rid of all cigarettes, ashtrays, matches, and lighters in your home, car, or work. Do not let people smoke in your home.  Review your past attempts to quit.  Think about what worked and what did not. GET SUPPORT AND ENCOURAGEMENT You have a better chance of being successful if you have help. You can get support in many ways.  Tell your family, friends, and coworkers that you are going to quit and need their support. Ask them not to smoke around you.  Get individual, group, or telephone counseling and support. Programs are available at [employer] and health centers. Call your local health department for information about programs in your area.  Spiritual beliefs and practices may help some smokers quit.  Download a "quit meter" on your computer to keep track of quit statistics, such as how long you have gone without smoking, cigarettes not smoked, and money saved.  Get a self-help book about quitting smoking and staying off tobacco. LEARN NEW SKILLS AND BEHAVIORS  Distract yourself from urges to smoke. Talk to someone, go for a walk, or occupy your time with a task.  Change your normal routine. Take a different route to work. Drink tea instead of coffee. Eat breakfast in a different place.  Reduce your stress. Take a hot bath, exercise, or read a book.  Plan something enjoyable to do every day. Reward yourself for not smoking.  Explore interactive web-based programs that specialize in helping you quit. GET MEDICINE AND USE IT CORRECTLY Medicines can help you stop smoking and decrease the urge to smoke. Combining medicine with the above behavioral methods and support can greatly increase your chances of successfully quitting smoking.  Nicotine replacement therapy helps deliver nicotine to your body without the negative effects and risks of smoking. Nicotine replacement therapy includes nicotine gum, lozenges, inhalers, nasal sprays, and skin patches. Some may be available over-the-counter and others require a prescription.  Antidepressant medicine helps people abstain from smoking, but how this works is unknown. This medicine is available  by prescription.  Nicotinic receptor partial agonist medicine simulates the effect of nicotine in your brain. This medicine is available by prescription. Ask your health care provider for advice about which medicines to use and how to use them based on your health history. Your health care provider will tell you what side effects to look out for if you choose to be on a medicine or therapy. Carefully read the information on the package. Do not use any other product containing nicotine while using a nicotine replacement product.  RELAPSE OR DIFFICULT SITUATIONS Most relapses occur within the first 3 months after quitting. Do not be discouraged if you start smoking again. Remember, most people try several times before finally quitting. You may have symptoms of withdrawal because your body is used to nicotine. You may crave cigarettes, be irritable, feel very hungry, cough often, get headaches, or have difficulty concentrating. The withdrawal symptoms are only temporary. They are strongest when you first quit, but they will go  away within 10-14 days. To reduce the chances of relapse, try to:  Avoid drinking alcohol. Drinking lowers your chances of successfully quitting.  Reduce the amount of caffeine you consume. Once you quit smoking, the amount of caffeine in your body increases and can give you symptoms, such as a rapid heartbeat, sweating, and anxiety.  Avoid smokers because they can make you want to smoke.  Do not let weight gain distract you. Many smokers will gain weight when they quit, usually less than 10 pounds. Eat a healthy diet and stay active. You can always lose the weight gained after you quit.  Find ways to improve your mood other than smoking. FOR MORE INFORMATION  www.smokefree.gov  Document Released: 10/10/2001 Document Revised: 03/02/2014 Document Reviewed: 01/25/2012 [facility] Patient Information 2015 [place], [place]. This information is not intended to replace advice given to  you by your health care provider. Make sure you discuss any questions you have with your health care provider.   A healthy lifestyle and preventative care can promote health and wellness.   Maintain regular health, dental, and eye exams.  Eat a healthy diet. Foods like vegetables, fruits, whole grains, low-fat dairy products, and lean protein foods contain the nutrients you need without too many calories. Decrease your intake of foods high in solid fats, added sugars, and salt. Get information about a proper diet from your caregiver, if necessary.  Regular physical exercise is one of the most important things you can do for your health. Most adults should get at least 150 minutes of moderate-intensity exercise (any activity that increases your heart rate and causes you to sweat) each week. In addition, most adults need muscle-strengthening exercises on 2 or more days a week.   Maintain a healthy weight. The body mass index (BMI) is a screening tool to identify possible weight problems. It provides an estimate of body fat based on height and weight. Your caregiver can help determine your BMI, and can help you achieve or maintain a healthy weight. For adults 20 years and older:  A BMI below 18.5 is considered underweight.  A BMI of 18.5 to 24.9 is normal.  A BMI of 25 to 29.9 is considered overweight.  A BMI of 30 and above is considered obese.

## 2014-06-02 NOTE — Progress Notes (Signed)
Patient ID: [name], female   DOB: 01/25/1961, 53 y.o.   MRN: [id number]    Subjective:   Patient ID: [name] female    DOB: 06/25/1961 53 y.o.    MRN: [id number] Health Maintenance Due: Health Maintenance Due  Topic Date Due  . Influenza Vaccine  05/30/2014    _________________________________________________  HPI: [name] is a 53 y.o. female here for an acute visit.  Pt has a PMH outlined below.  Please see problem-based charting assessment and plan note for further details of medical issues addressed at today's visit.  PMH: Past Medical History  Diagnosis Date  . Rectal bleeding 2007    internal hemmorhoids by colonoscopy in 04/2006, Bx neg for IBD  . Duodenitis     secondary to H pylori(treatment completed 05/2006), +/- NSAIDS  . Alcoholism   . History of cocaine abuse   . PE 05/16/2009    CT angio positive for PE in 07/10, treated with coumadin for 8 months.   Marland Kitchen. ETOH abuse   . Anxiety   . Depression   . Pulmonary embolism     a. 04/2009 - treated with coumadin x 8 mos.  . DVT (deep venous thrombosis)     a. 04/2009 - treated with coumadin x 8 mos.  . Rectal bleeding     a. int hemorrhoids by colonoscopy in 04/2006, Bx neg for IBD  . Duodenitis     a. 05/2006 2/2 H pylori +/- NSAIDS  . History of CVA (cerebrovascular accident)   . Tobacco abuse   . Chest pain     a. 04/2009 Echo: EF 55-60%, Gr1 DD, Mild MR.  Marland Kitchen. Hypertension   . Heart murmur   . Stroke   . Seizures   . ANEMIA, VITAMIN B12 DEFICIENCY 11/11/2007    Documented low 175 on 09/2007; received 6 yrs of IM therapy; switched to po 05/2013     Medications: Current Outpatient Prescriptions on File Prior to Visit  Medication Sig Dispense Refill  . aspirin EC 81 MG tablet Take 81 mg by mouth daily.      . carvedilol (COREG) 12.5 MG tablet Take 1 tablet (12.5 mg total) by mouth 2 (two) times daily.  60 tablet  3  . cephALEXin (KEFLEX) 500 MG capsule Take 1 capsule (500 mg total) by mouth  3 (three) times daily.  15 capsule  0  . Cholecalciferol 1000 UNITS capsule Take 1 capsule (1,000 Units total) by mouth daily.  30 capsule  12  . loratadine (CLARITIN) 10 MG tablet Take 10 mg by mouth daily as needed for allergies.       Marland Kitchen. thiamine 100 MG tablet Take 1 tablet (100 mg total) by mouth daily.  30 tablet  12  . venlafaxine XR (EFFEXOR-XR) 75 MG 24 hr capsule Take 1 capsule (75 mg total) by mouth daily. For hot flashes  30 capsule  2  . vitamin B-12 (CYANOCOBALAMIN) 1000 MCG tablet Take 1,000 mcg by mouth daily.       No current facility-administered medications on file prior to visit.    Allergies: Allergies  Allergen Reactions  . Iodinated Diagnostic Agents Other (See Comments)    Seizure  . Omnipaque [Iohexol] Other (See Comments)    Seizure   . Omnipaque [Iohexol] Other (See Comments)    Seizure     FH: Family History  Problem Relation Age of Onset  . Coronary artery disease      1 st degree female  relative <60 and female <50  . Diabetes type II      1 st degree relative  . Breast cancer Mother   . Heart disease Mother   . Heart disease Father   . Heart disease Brother   . Breast cancer      1st degree relative <50  . Diabetes Mother   . CAD Mother   . Colon cancer Father   . CAD Father   . Breast cancer Sister   . Diabetes Sister   . Diabetes Brother   . CAD Brother     SH: History   Social History  . Marital Status: Single    Spouse Name: N/A    Number of Children: 2  . Years of Education: N/A   Occupational History  . Unemployed    Social History Main Topics  . Smoking status: Current Every Day Smoker -- 0.10 packs/day    Types: Cigarettes    Last Attempt to Quit: 01/16/2013  . Smokeless tobacco: Never Used     Comment: smokes about 1-2 cigs/day  . Alcohol Use: Yes     Comment: beer  . Drug Use: No     Comment: former  . Sexual Activity: None   Other Topics Concern  . None   Social History Narrative   ** Merged History  Encounter **       ** Data from: 12/05/12 Enc Dept: [facility]   No cocaine or alcohol use since hospital discharge 05/19/2009.      10/13 update:  States she is smoking 1-2 cigarettes daily with at least 1 drink daily usually Brandy.                  ** Data from: 01/17/13 Enc Dept: [facility]   Pt lives in [place] with a roommate.          Review of Systems: Constitutional: Negative for fever, chills and weight loss.  Eyes: Negative for blurred vision.  Respiratory: Negative for cough and shortness of breath.  Cardiovascular: Negative for chest pain, palpitations and leg swelling.  Gastrointestinal: Negative for nausea, vomiting, abdominal pain, diarrhea, constipation and blood in stool.  Genitourinary: Negative for dysuria, urgency and frequency.  Musculoskeletal: Negative for myalgias and back pain.  Neurological: Negative for dizziness, weakness and headaches.     Objective:   Vital Signs: Filed Vitals:   06/02/14 1410 06/02/14 1411  BP:  137/90  Pulse:  75  Temp:  97.5 F (36.4 C)  TempSrc:  Oral  Height: 5' 4.5" (1.638 m)   Weight: 127 lb 11.2 oz (57.924 kg)   SpO2:  99%      BP Readings from Last 3 Encounters:  06/02/14 137/90  06/01/14 118/89  05/18/14 142/76    Physical Exam: Constitutional: Vital signs reviewed.  Patient is well-developed and well-nourished in NAD and cooperative with exam.  Head: Normocephalic and atraumatic. Eyes: PERRL, EOMI, conjunctivae nl, no scleral icterus.  Neck: Supple. Cardiovascular: RRR, no MRG. Pulmonary/Chest: normal effort, non-tender to palpation, CTAB, no wheezes, rales, or rhonchi. Abdominal: Soft. NT/ND +BS. Neurological: A&O x3, cranial nerves II-XII are grossly intact, moving all extremities. Extremities: 2+DP b/l; no pitting edema. Skin: Warm, dry and intact. No rash.  Most Recent Laboratory Results:  CMP     Component Value Date/Time   NA 142 06/01/2014 0942   K 3.5* 06/01/2014 0942   CL 100  06/01/2014 0942   CO2 20 06/01/2014 0942   GLUCOSE  76 06/01/2014 0942   BUN 10 06/01/2014 0942   CREATININE 0.62 06/01/2014 0942   CREATININE 0.76 09/22/2013 1039   CALCIUM 8.7 06/01/2014 0942   PROT 7.4 06/01/2014 0942   ALBUMIN 3.9 06/01/2014 0942   AST 87* 06/01/2014 0942   ALT 29 06/01/2014 0942   ALKPHOS 79 06/01/2014 0942   BILITOT 0.7 06/01/2014 0942   GFRNONAA >90 06/01/2014 0942   GFRNONAA >89 11/15/2012 1152   GFRAA >90 06/01/2014 0942   GFRAA >89 11/15/2012 1152    CBC    Component Value Date/Time   WBC 4.4 06/01/2014 0942   RBC 3.65* 06/01/2014 0942   RBC 4.35 06/02/2013 1010   HGB 11.3* 06/01/2014 0942   HCT 34.2* 06/01/2014 0942   PLT 238 06/01/2014 0942   MCV 93.7 06/01/2014 0942   MCH 31.0 06/01/2014 0942   MCHC 33.0 06/01/2014 0942   RDW 15.3 06/01/2014 0942   LYMPHSABS 2.5 09/22/2013 1039   MONOABS 0.5 09/22/2013 1039   EOSABS 0.1 09/22/2013 1039   BASOSABS 0.0 09/22/2013 1039    Lipid Panel Lab Results  Component Value Date   CHOL 247* 05/18/2014   HDL 90 05/18/2014   LDLCALC 106* 05/18/2014   TRIG 256* 05/18/2014   CHOLHDL 2.7 05/18/2014    HA1C Lab Results  Component Value Date   HGBA1C  Value: 5.2 (NOTE) The ADA recommends the following therapeutic goal for glycemic control related to Hgb A1c measurement: Goal of therapy: <6.5 Hgb A1c  Reference: American Diabetes Association: Clinical Practice Recommendations 2010, Diabetes Care, 2010, 33: (Suppl  1). 05/17/2009    Urinalysis    Component Value Date/Time   COLORURINE AMBER* 06/01/2014 1131   APPEARANCEUR HAZY* 06/01/2014 1131   LABSPEC 1.026 06/01/2014 1131   PHURINE 5.5 06/01/2014 1131   GLUCOSEU NEGATIVE 06/01/2014 1131   GLUCOSEU NEG mg/dL 06/27/9370 6967   HGBUR LARGE* 06/01/2014 1131   HGBUR negative 01/06/2010 1058   BILIRUBINUR SMALL* 06/01/2014 1131   KETONESUR 15* 06/01/2014 1131   PROTEINUR NEGATIVE 06/01/2014 1131   UROBILINOGEN 1.0 06/01/2014 1131   NITRITE NEGATIVE 06/01/2014 1131   LEUKOCYTESUR NEGATIVE 06/01/2014 1131    Urine  Microalbumin No results found for this basename: MICROALBUR,  [id number]    Imaging N/A   Assessment & Plan:   Assessment and plan was discussed and formulated with my attending.

## 2014-06-02 NOTE — Assessment & Plan Note (Addendum)
Denies CP today.  Will see Dr. Shirlee Latch on 8/10.

## 2014-06-02 NOTE — Assessment & Plan Note (Signed)
Pt reports having dyspepsia once/wk and pepcid helps.   -refilled pepcid

## 2014-06-02 NOTE — Assessment & Plan Note (Addendum)
Pt p/w UTI and hematuria after going to ED yesterday morning.  Was having sharp pains in her pelvic area with noticed blood when she wiped and urinating more frequently.  No history of kidney stones.  No h/o bladder or renal cell cancer in her or family.  She is smoking 2 cig/day.  UA in the ED was not suggestive of UTI --no leukocyte esterase or nitrites.  UA had some rbcs with ketones.  Lipase wnl.  Did a pelvic exam that was unremarkable in the EDNo occupational exposure to chemicals or dyes.  No fever/chills.  Is feeling much better today since she has been taking keflex 500mg  tid for 5 days.    -drink plenty of water-->no juices -give 2 day course of azo  -order to repeat UA in 1 week (pt instructed to come in for a lab visit) -follow-up with me in 3 months or sooner if continued symptoms

## 2014-06-03 IMAGING — CR DG CHEST 1V PORT
1 series · 1 of 1 positions shown · non-contrast
Comparison: 01/21/2013

CLINICAL DATA: Repositioning of endotracheal tube.

PORTABLE CHEST - 1 VIEW

[AP]
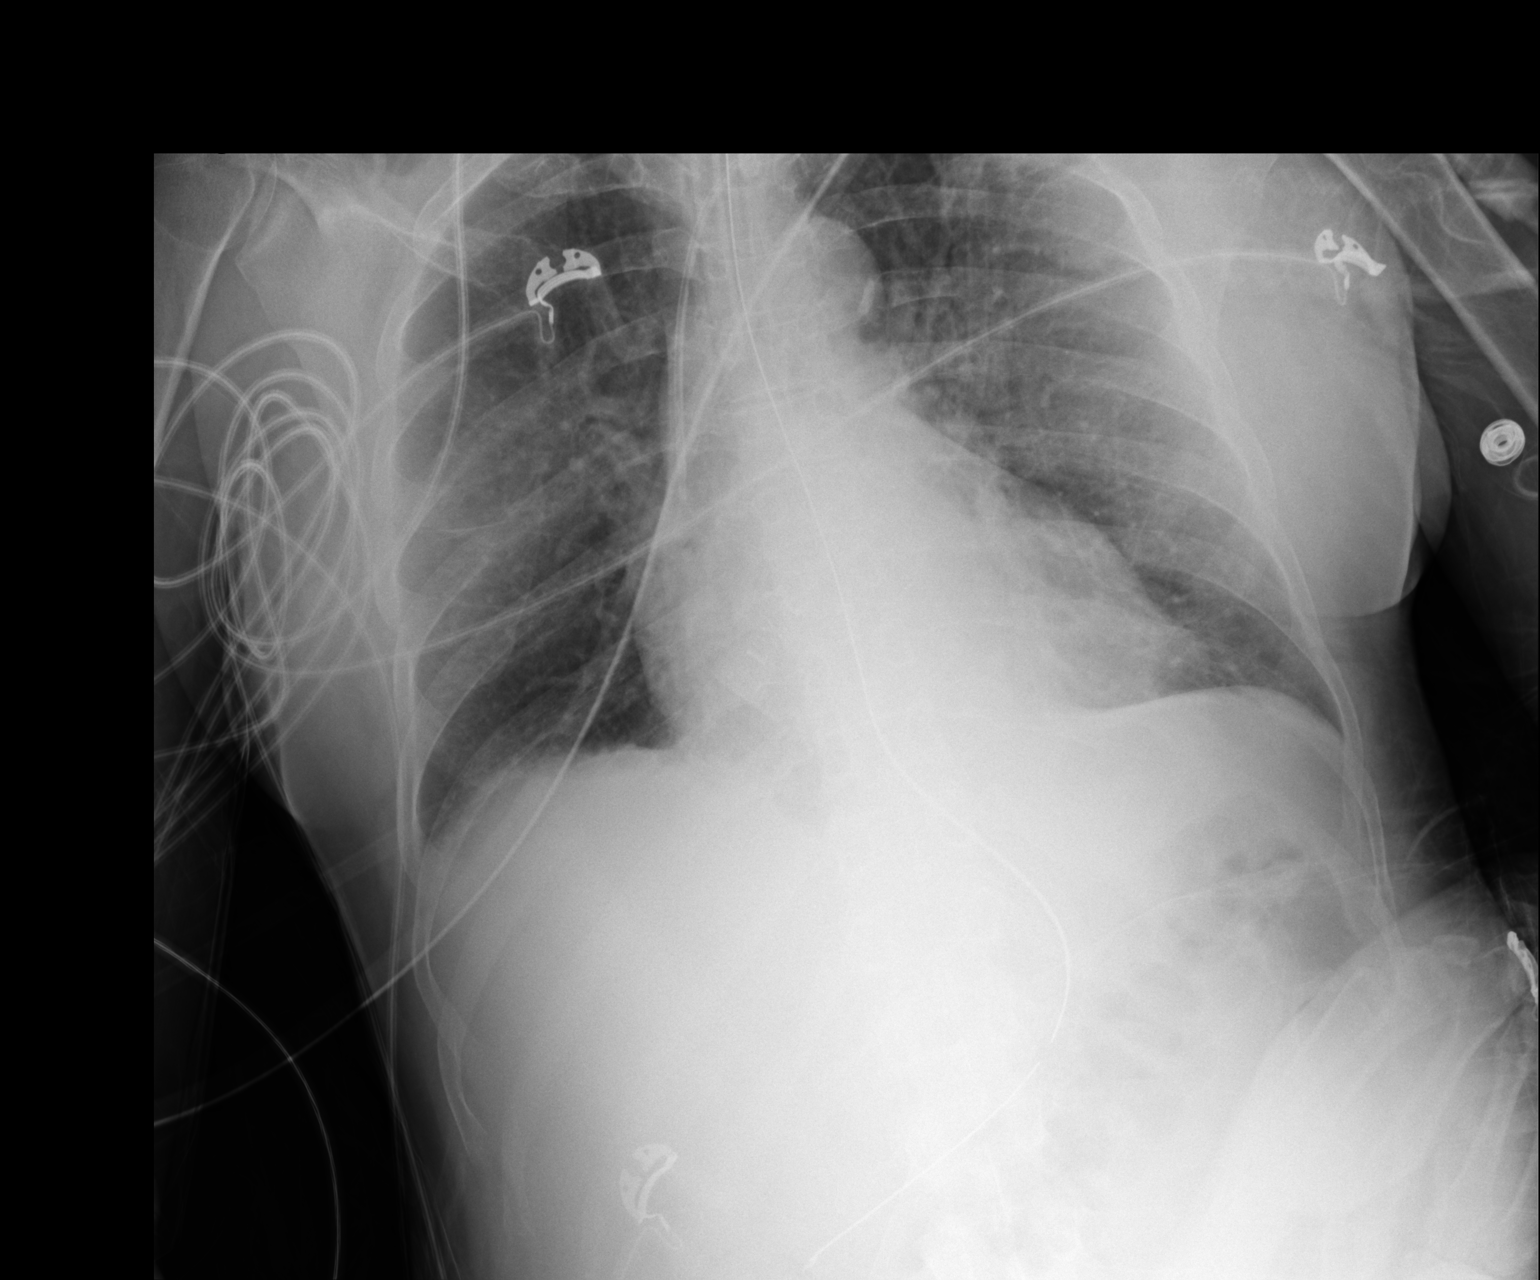

[1 of 1 positions shown; findings below may reference images not displayed]

FINDINGS: On the current radiograph, the endotracheal tube tip
projects just barely above the carina. Nasogastric tube continues
to extend into the stomach.  Central line positioning is stable.
Lungs show improved aeration since the prior study with less
prominent atelectasis bilaterally.
IMPRESSION: The endotracheal tube tip is barely above the carina.  Both lungs
show improved aeration.

## 2014-06-03 NOTE — Progress Notes (Signed)
Pharmacy called and Pyridium does not come in the dose that was ordered. I returned the call and talked with Dr Delane Ginger and pt infomred to get OTC pyridium 97 mg take three times a day for 2 days. Pt voices understanding

## 2014-06-03 NOTE — Progress Notes (Signed)
INTERNAL MEDICINE TEACHING ATTENDING ADDENDUM - Jammi Morrissette, MD: I reviewed and discussed at the time of visit with the resident Dr. Gill, the patient's medical history, physical examination, diagnosis and results of pertinent tests and treatment and I agree with the patient's care as documented.  

## 2014-06-04 IMAGING — CR DG ABD PORTABLE 1V
1 series · 1 of 1 positions shown · non-contrast
Comparison: None.

CLINICAL DATA: NG tube placement

PORTABLE ABDOMEN - 1 VIEW

[AP]
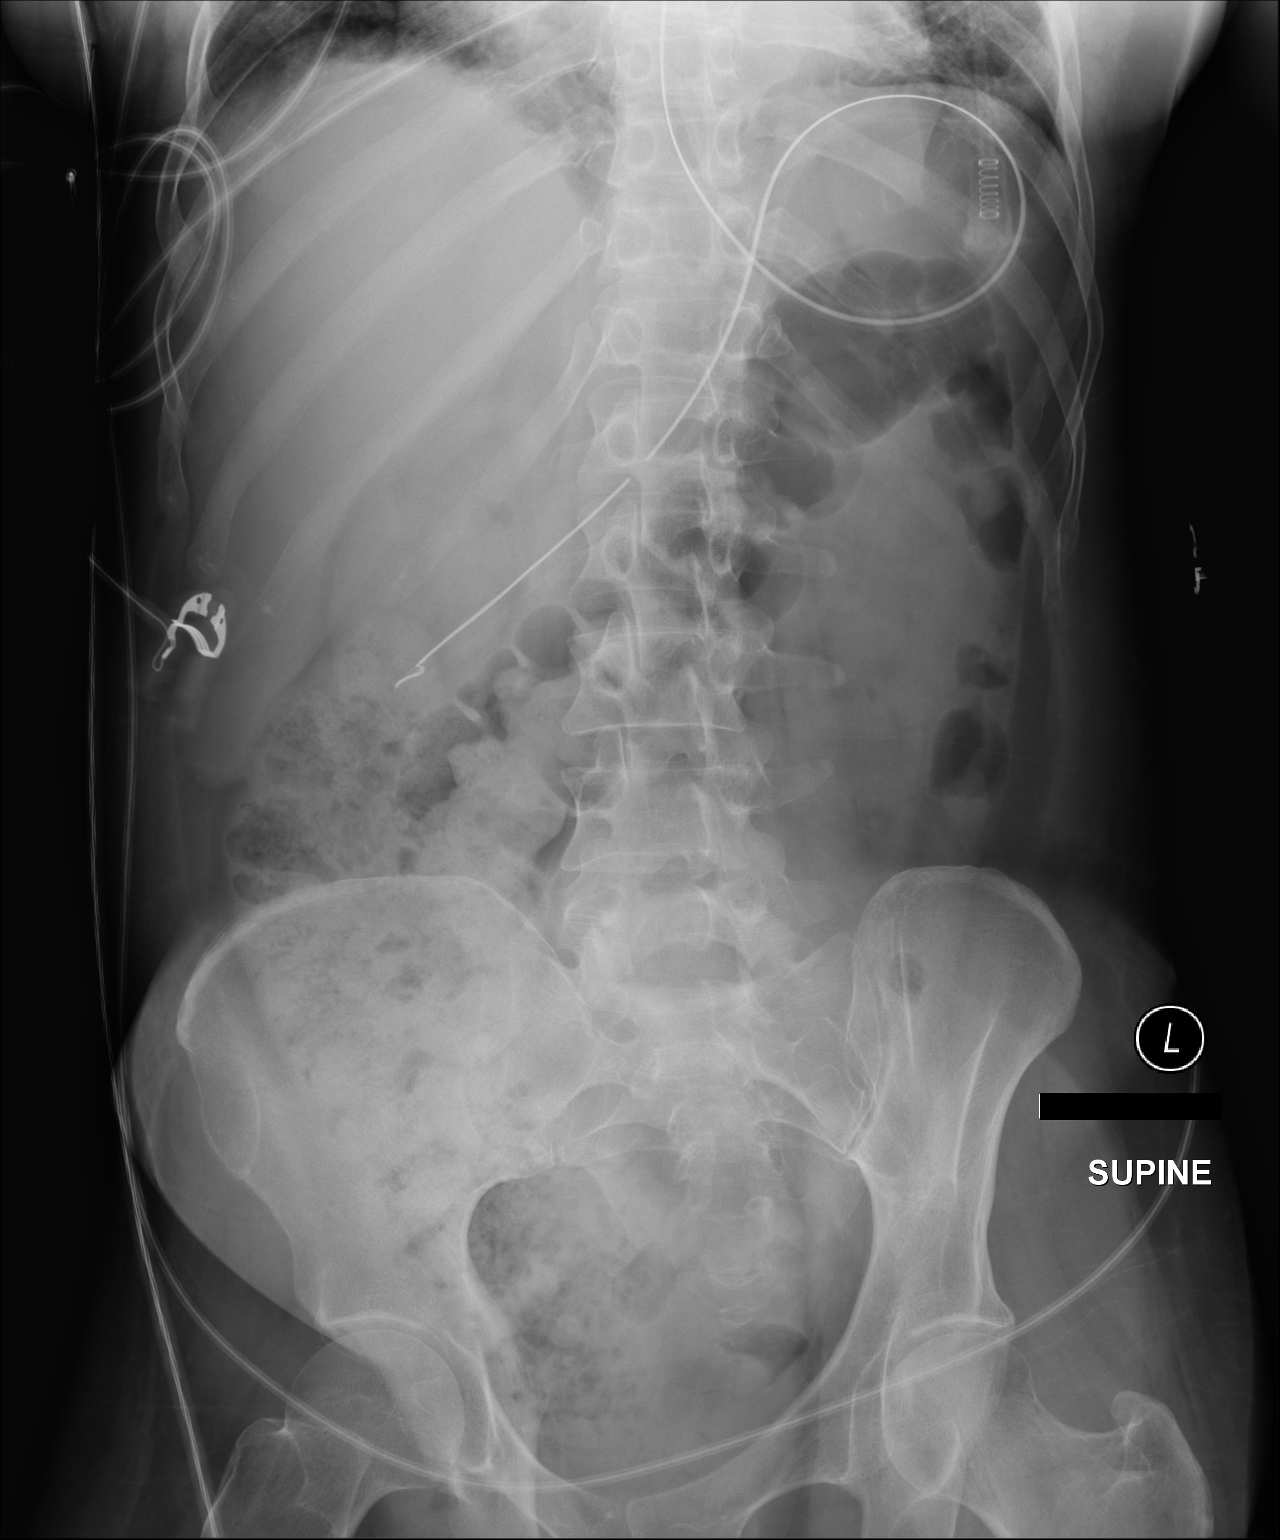

[1 of 1 positions shown; findings below may reference images not displayed]

FINDINGS: The NG tube is coiled in the stomach and the tip is in
the gastric antrum or duodenal bulb.  No disproportionate
dilatation of bowel.  Moderate stool burden in the ascending colon.
IMPRESSION: NG tube tip is either in the antrum of the stomach or duodenal bulb
region.

## 2014-06-08 ENCOUNTER — Encounter: Payer: Self-pay | Admitting: Physician Assistant

## 2014-06-08 ENCOUNTER — Other Ambulatory Visit: Payer: No Typology Code available for payment source

## 2014-06-08 ENCOUNTER — Ambulatory Visit (INDEPENDENT_AMBULATORY_CARE_PROVIDER_SITE_OTHER): Payer: No Typology Code available for payment source | Admitting: Physician Assistant

## 2014-06-08 VITALS — BP 120/75 | HR 81 | Ht 64.5 in | Wt 126.0 lb

## 2014-06-08 DIAGNOSIS — F17200 Nicotine dependence, unspecified, uncomplicated: Secondary | ICD-10-CM

## 2014-06-08 DIAGNOSIS — N3001 Acute cystitis with hematuria: Secondary | ICD-10-CM

## 2014-06-08 DIAGNOSIS — I10 Essential (primary) hypertension: Secondary | ICD-10-CM

## 2014-06-08 DIAGNOSIS — Z716 Tobacco abuse counseling: Secondary | ICD-10-CM

## 2014-06-08 DIAGNOSIS — F1010 Alcohol abuse, uncomplicated: Secondary | ICD-10-CM

## 2014-06-08 DIAGNOSIS — Z8674 Personal history of sudden cardiac arrest: Secondary | ICD-10-CM

## 2014-06-08 DIAGNOSIS — Z87898 Personal history of other specified conditions: Secondary | ICD-10-CM | POA: Insufficient documentation

## 2014-06-08 DIAGNOSIS — Z7189 Other specified counseling: Secondary | ICD-10-CM

## 2014-06-08 DIAGNOSIS — R079 Chest pain, unspecified: Secondary | ICD-10-CM

## 2014-06-08 DIAGNOSIS — E785 Hyperlipidemia, unspecified: Secondary | ICD-10-CM

## 2014-06-08 MED ORDER — NITROGLYCERIN 0.4 MG SL SUBL: 0.4000 mg | SUBLINGUAL_TABLET | SUBLINGUAL | Status: DC | PRN

## 2014-06-08 MED ORDER — ATORVASTATIN CALCIUM 40 MG PO TABS: 40.0000 mg | ORAL_TABLET | Freq: Every day | ORAL | Status: DC

## 2014-06-08 NOTE — Assessment & Plan Note (Signed)
Blood pressure elevated today. 2 g sodium diet.

## 2014-06-08 NOTE — Assessment & Plan Note (Signed)
Patient continues to smoke one to 2 cigarettes daily. I've advised her to quit smoking completely.

## 2014-06-08 NOTE — Assessment & Plan Note (Addendum)
Patient describes about 6 month history of exertional chest pain walking up hills. She had more worrisome episode 2 weeks ago that eased after 5 minutes of rest. Troponin was negative and EKG were unchanged. Unfortunately the patient has begun drinking alcohol again. 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 There is no CAD at cath an initial ejection fraction was 25% by echo but recovered to 51%. She was hypokalemic when she first arrived in the ER and had quit drinking 2-3 days prior to her arrest and she was a heavy drinker prior to this. Was felt her arrest could be related to adrenergic surge from alcohol withdrawal.  I will order a stress Myoview to rule out ischemia. I've encouraged the patient to cut back and stop her alcohol use completely. Start Lipitor for hyperlipidemia. Increase potassium rich foods. Follow up with Dr. Sherlie Ban after stress test

## 2014-06-08 NOTE — Assessment & Plan Note (Signed)
Patient is drinking 2-3 shots of gin daily. She said she started back approximately one month ago. I had along discussion with the patient concerning the risks of alcohol abuse and her prior cardiac arrest. I've asked her to quit drinking alcohol.

## 2014-06-08 NOTE — Patient Instructions (Addendum)
  Your physician has recommended you make the following change in your medication  1. START LIPITOR 40 MG ONCE A DAY   2. NITROGLYCERIN O.4 SUBLINGUAL     Your physician recommends that you return for lab work in:  6 WEEKS LFT AND LIPID PANEL    Your physician has requested that you have en exercise stress myoview. For further information please visit [email or web address] Please follow instruction sheet, as given.  Your physician recommends that you schedule a follow-up appointment in:  DR [facility] IN ONE MONTH FOLLOWING UP AFTER STRESS TEST  ENCOURAGE TO QUIT SMOKING AND DRINKING

## 2014-06-08 NOTE — Progress Notes (Signed)
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 She was defibrillated in the field by EMS and cardiac cath showed no CAD. Initial echo EF was 25% with moderate MR. Cardiac MRI done prior to discharge showed recovery of EF to 51% with small areas of basal inferior and mid inferolateral delayed enhancement. Initially she was hypokalemic and she quit drinking a couple of days before her cardiac arrest. Followup 2-D echo ejection fraction was 55%, with mild septal hypokinesis and normal RV size and systolic function.  Patient had an episode shooting chest pain and tenderness in her chest a couple of weeks ago, seen by primary care. Her carvedilol was increased to 12.5 mg twice a day, EKG was stable and troponin negative. She does continue to smoke one to 2 cigarettes a day but denies any alcohol use or abuse. Her lipid profile is abnormal and she is not on any therapy. She also recently was diagnosed with a UTI.  Patient describes squeezing chest pain into her left shoulder when she was walking to the store. She had a sit down and rest. The pain eased after 5 minutes. She had associated dyspnea but no dizziness or presyncope. She said she frequently has this when she walks uphill and it's been going on for at least 6 months.. She also started drinking one month ago-2-3 shots of gin per day. She smokes one to 2 cigarettes daily also. Patient also complains of frequent palpitations that seem to have gotten better since increasing her carvedilol.   Allergies -- Iodinated Diagnostic Agents -- Other (See Comments)   --  Seizure  -- Omnipaque [Iohexol] -- Other (See Comments)   --  Seizure  -- Omnipaque [Iohexol] -- Other (See Comments)   --  Seizure  Current Outpatient Prescriptions on File Prior to Visit: aspirin EC 81 MG tablet, Take 81 mg by mouth daily., Disp: , Rfl:  carvedilol  (COREG) 12.5 MG tablet, Take 1 tablet (12.5 mg total) by mouth 2 (two) times daily., Disp: 60 tablet, Rfl: 3 cephALEXin (KEFLEX) 500 MG capsule, Take 1 capsule (500 mg total) by mouth 3 (three) times daily., Disp: 15 capsule, Rfl: 0 Cholecalciferol 1000 UNITS capsule, Take 1 capsule (1,000 Units total) by mouth daily., Disp: 30 capsule, Rfl: 12 famotidine (PEPCID) 20 MG tablet, Take 1 tablet (20 mg total) by mouth 2 (two) times daily., Disp: 60 tablet, Rfl: 6 folic acid (FOLVITE) 1 MG tablet, Take 1 tablet (1 mg total) by mouth daily., Disp: 30 tablet, Rfl: 3 lisinopril-hydrochlorothiazide (PRINZIDE,ZESTORETIC) 10-12.5 MG per tablet, Take 1 tablet by mouth daily., Disp: 30 tablet, Rfl: 3 loratadine (CLARITIN) 10 MG tablet, Take 10 mg by mouth daily as needed for allergies. , Disp: , Rfl:  phenazopyridine (PYRIDIUM) 97 MG tablet, Take 1 tablet (97 mg total) by mouth 3 (three) times daily as needed for pain., Disp: 10 tablet, Rfl: 0 thiamine 100 MG tablet, Take 1 tablet (100 mg total) by mouth daily., Disp: 30 tablet, Rfl: 12 venlafaxine XR (EFFEXOR-XR) 75 MG 24 hr capsule, Take 1 capsule (75 mg total) by mouth daily. For hot flashes, Disp: 30 capsule, Rfl: 2 vitamin B-12 (CYANOCOBALAMIN) 1000 MCG tablet, Take 1,000 mcg by mouth daily., Disp: , Rfl:   No current facility-administered medications on file prior to visit.   Past Medical History:   Rectal bleeding  2007           Comment:internal hemmorhoids by colonoscopy in 04/2006,               Bx neg for IBD   Duodenitis                                                     Comment:secondary to H pylori(treatment completed               05/2006), +/- NSAIDS   Alcoholism                                                   History of cocaine abuse                                     PE                                              05/16/2009      Comment:CT angio positive for PE in 07/10, treated with               coumadin for 8 months.    ETOH abuse                                                   Anxiety                                                      Depression                                                   Pulmonary embolism                                             Comment:a. 04/2009 - treated with coumadin x 8 mos.   DVT (deep venous thrombosis)                                   Comment:a. 04/2009 - treated with coumadin x 8 mos.   Rectal bleeding                                                Comment:a.  int hemorrhoids by colonoscopy in 04/2006, Bx              neg for IBD   Duodenitis                                                     Comment:a. 05/2006 2/2 H pylori +/- NSAIDS   History of CVA (cerebrovascular accident)                    Tobacco abuse                                                Chest pain                                                     Comment:a. 04/2009 Echo: EF 55-60%, Gr1 DD, Mild MR.   Hypertension                                                 Heart murmur                                                 Stroke                                                       Seizures                                                     ANEMIA, VITAMIN B12 DEFICIENCY                  11/11/2007      Comment:Documented low 175 on 09/2007; received 6 yrs               of IM therapy; switched to po 05/2013   Past Surgical History:   CESAREAN SECTION                                              CARDIAC CATHETERIZATION                                      Review of patient's family history indicates:   Coronary artery disease  Comment: 1 st degree female relative <60 and female              <50   Diabetes type II                                          Comment: 1 st degree relative   Breast cancer                  Mother                   Heart disease                  Mother                   Heart disease                  Father                    Heart disease                  Brother                  Breast cancer                                             Comment: 1st degree relative <50   Diabetes                       Mother                   CAD                            Mother                   Colon cancer                   Father                   CAD                            Father                   Breast cancer                  Sister                   Diabetes                       Sister                   Diabetes                       Brother                  CAD  Brother                  Social History   Marital Status: Single              Spouse Name:                      Years of Education:                 Number of children: 2           Occupational History Occupation          Tax inspector              Unemployed                                Social History Main Topics   Smoking Status: Current Every Day Smoker        Packs/Day: 0.10  Years:           Types: Cigarettes     Last Attempt to quit: 01/16/2013   Smokeless Status: Never Used                       Comment: smokes about 1-2 cigs/day   Alcohol Use: Yes               Comment: beer   Drug Use: No                Comment: former   Sexual Activity: Not on file        Other Topics            Concern   None on file  Social History Narrative   Merged History Encounter      Data from: 12/05/12 Enc Dept: [facility]   No cocaine or alcohol use since hospital discharge 05/19/2009.    10/13 update:  States she is smoking 1-2 cigarettes daily with at least 1 drink daily usually Brandy.         Data from: 01/17/13 Enc Dept: [place] lives in [place] with a roommate.      ROS: See history of present illness otherwise negative   PHYSICAL EXAM: Thin, in no acute distress. Neck: No JVD, HJR, Bruit, or thyroid enlargement  Lungs: Decreased breath sounds No  tachypnea, clear without wheezing, rales, or rhonchi  Cardiovascular: RRR, PMI not displaced, heart sounds distant, 2/6 systolic murmur at the left sternal border, no gallops, bruit, thrill, or heave.  Abdomen: BS normal. Soft without organomegaly, masses, lesions or tenderness.  Extremities: without cyanosis, clubbing or edema. Good distal pulses bilateral  SKin: Warm, no lesions or rashes   Musculoskeletal: No deformities  Neuro: no focal signs  LMP 11/02/2010  BP 120/75  Pulse 81  Ht 5' 4.5" (1.638 m)  Wt 126 lb (57.153 kg)  BMI 21.30 kg/m2  LMP 11/02/2010   EKG: Normal sinus rhythm with left bundle branch block, PVCs, no acute change  2-D echo 04/16/2013 Study Conclusions  - Left ventricle: The cavity size was normal. Wall thickness   was normal. The estimated ejection fraction was 55%. Mild   septal hypokinesis. - Left atrium: The atrium was mildly dilated. - Right ventricle: The cavity size was normal. Systolic   function was normal. -  Inferior vena cava: The vessel was normal in size; the   respirophasic diameter changes were in the normal range (=   50%); findings are consistent with normal central venous   pressure. Impressions:  - Limited echo for EF.    Recent Results (from the past 2160 hour(s))  TROPONIN I     Status: None   Collection Time    05/18/14  2:52 PM      Result Value Ref Range   Troponin I <0.30  <0.06 ng/mL   Comment: No indication of myocardial injury.           Due to the release kinetics of cTnI, a negative result within the     first hours of the onset of symptoms does not rule out myocardial     infarction with certainty. If myocardial infarction is still suspected     repeat the test at appropriate intervals.  LIPID PANEL     Status: Abnormal   Collection Time    05/18/14  2:52 PM      Result Value Ref Range   Cholesterol 247 (*) 0 - 200 mg/dL   Comment: ATP III Classification:           < 200        mg/dL        Desirable           200 - 239     mg/dL        Borderline High          >= 240        mg/dL        High         Triglycerides 256 (*) <150 mg/dL   HDL 90  >39 mg/dL   Total CHOL/HDL Ratio 2.7     VLDL 51 (*) 0 - 40 mg/dL   LDL Cholesterol 106 (*) 0 - 99 mg/dL   Comment:       Total Cholesterol/HDL Ratio:CHD Risk                            Coronary Heart Disease Risk Table                                            Men       Women              1/2 Average Risk              3.4        3.3                  Average Risk              5.0        4.4               2X Average Risk              9.6        7.1               3X Average Risk             23.4       11.0     Use the calculated Patient Ratio above and the CHD Risk table  to determine the patient's CHD Risk.     ATP III Classification (LDL):           < 100        mg/dL         Optimal          100 - 129     mg/dL         Near or Above Optimal          130 - 159     mg/dL         Borderline High          160 - 189     mg/dL         High           > 190        mg/dL         Very High        POC OCCULT BLOOD, ED     Status: None   Collection Time    06/01/14  9:37 AM      Result Value Ref Range   Fecal Occult Bld NEGATIVE  NEGATIVE  CBC     Status: Abnormal   Collection Time    06/01/14  9:42 AM      Result Value Ref Range   WBC 4.4  4.0 - 10.5 K/uL   RBC 3.65 (*) 3.87 - 5.11 MIL/uL   Hemoglobin 11.3 (*) 12.0 - 15.0 g/dL   HCT 34.2 (*) 36.0 - 46.0 %   MCV 93.7  78.0 - 100.0 fL   MCH 31.0  26.0 - 34.0 pg   MCHC 33.0  30.0 - 36.0 g/dL   RDW 15.3  11.5 - 15.5 %   Platelets 238  150 - 400 K/uL  COMPREHENSIVE METABOLIC PANEL     Status: Abnormal   Collection Time    06/01/14  9:42 AM      Result Value Ref Range   Sodium 142  137 - 147 mEq/L   Potassium 3.5 (*) 3.7 - 5.3 mEq/L   Chloride 100  96 - 112 mEq/L   CO2 20  19 - 32 mEq/L   Glucose, Bld 76  70 - 99 mg/dL   BUN 10  6 - 23 mg/dL   Creatinine, Ser 0.62  0.50 - 1.10 mg/dL    Calcium 8.7  8.4 - 10.5 mg/dL   Total Protein 7.4  6.0 - 8.3 g/dL   Albumin 3.9  3.5 - 5.2 g/dL   AST 87 (*) 0 - 37 U/L   Comment: HEMOLYSIS AT THIS LEVEL MAY AFFECT RESULT   ALT 29  0 - 35 U/L   Alkaline Phosphatase 79  39 - 117 U/L   Total Bilirubin 0.7  0.3 - 1.2 mg/dL   GFR calc non Af Amer >90  >90 mL/min   GFR calc Af Amer >90  >90 mL/min   Comment: (NOTE)     The eGFR has been calculated using the CKD EPI equation.     This calculation has not been validated in all clinical situations.     eGFR's persistently <90 mL/min signify possible Chronic Kidney     Disease.   Anion gap 22 (*) 5 - 15  LIPASE, BLOOD     Status: None   Collection Time    06/01/14  9:42 AM      Result Value Ref Range   Lipase 27  11 - 59 U/L  URINE  CULTURE     Status: None   Collection Time    06/01/14 11:31 AM      Result Value Ref Range   Specimen Description URINE, RANDOM     Special Requests NONE     Culture  Setup Time       Value: 06/01/2014 12:28     Performed at [facility] Count       Value: 35,000 COLONIES/ML     Performed at [facility]   Culture       Value: Multiple bacterial morphotypes present, none predominant. Suggest appropriate recollection if clinically indicated.     Performed at [facility]   Report Status 06/02/2014 FINAL    URINALYSIS, ROUTINE W REFLEX MICROSCOPIC     Status: Abnormal   Collection Time    06/01/14 11:31 AM      Result Value Ref Range   Color, Urine AMBER (*) YELLOW   Comment: BIOCHEMICALS MAY BE AFFECTED BY COLOR   APPearance HAZY (*) CLEAR   Specific Gravity, Urine 1.026  1.005 - 1.030   pH 5.5  5.0 - 8.0   Glucose, UA NEGATIVE  NEGATIVE mg/dL   Hgb urine dipstick LARGE (*) NEGATIVE   Bilirubin Urine SMALL (*) NEGATIVE   Ketones, ur 15 (*) NEGATIVE mg/dL   Protein, ur NEGATIVE  NEGATIVE mg/dL   Urobilinogen, UA 1.0  0.0 - 1.0 mg/dL   Nitrite NEGATIVE  NEGATIVE   Leukocytes, UA NEGATIVE  NEGATIVE  URINE  MICROSCOPIC-ADD ON     Status: Abnormal   Collection Time    06/01/14 11:31 AM      Result Value Ref Range   Squamous Epithelial / LPF MANY (*) RARE   WBC, UA 3-6  <3 WBC/hpf   RBC / HPF 11-20  <3 RBC/hpf   Bacteria, UA MANY (*) RARE   Urine-Other MUCOUS PRESENT

## 2014-06-08 NOTE — Assessment & Plan Note (Signed)
Patient is having recurrent chest pain. Check stress Myoview to rule out ischemia.

## 2014-06-09 LAB — URINALYSIS, ROUTINE W REFLEX MICROSCOPIC
Bilirubin Urine: NEGATIVE
Glucose, UA: NEGATIVE mg/dL
Ketones, ur: NEGATIVE mg/dL
Nitrite: NEGATIVE
PH: 5.5 (ref 5.0–8.0)
Protein, ur: NEGATIVE mg/dL
Specific Gravity, Urine: 1.018 (ref 1.005–1.030)
UROBILINOGEN UA: 0.2 mg/dL (ref 0.0–1.0)

## 2014-06-09 LAB — URINALYSIS, MICROSCOPIC ONLY
Bacteria, UA: NONE SEEN
CASTS: NONE SEEN
CRYSTALS: NONE SEEN

## 2014-06-15 ENCOUNTER — Ambulatory Visit (HOSPITAL_COMMUNITY): Payer: No Typology Code available for payment source | Attending: Cardiology | Admitting: Radiology

## 2014-06-15 VITALS — BP 125/90 | HR 73 | Ht 64.0 in | Wt 122.0 lb

## 2014-06-15 DIAGNOSIS — I2510 Atherosclerotic heart disease of native coronary artery without angina pectoris: Secondary | ICD-10-CM

## 2014-06-15 DIAGNOSIS — Z8249 Family history of ischemic heart disease and other diseases of the circulatory system: Secondary | ICD-10-CM | POA: Insufficient documentation

## 2014-06-15 DIAGNOSIS — F17200 Nicotine dependence, unspecified, uncomplicated: Secondary | ICD-10-CM | POA: Insufficient documentation

## 2014-06-15 DIAGNOSIS — R079 Chest pain, unspecified: Secondary | ICD-10-CM

## 2014-06-15 DIAGNOSIS — I999 Unspecified disorder of circulatory system: Secondary | ICD-10-CM | POA: Insufficient documentation

## 2014-06-15 DIAGNOSIS — R0602 Shortness of breath: Secondary | ICD-10-CM

## 2014-06-15 DIAGNOSIS — I10 Essential (primary) hypertension: Secondary | ICD-10-CM | POA: Insufficient documentation

## 2014-06-15 MED ORDER — TECHNETIUM TC 99M SESTAMIBI GENERIC - CARDIOLITE
10.0000 | Freq: Once | INTRAVENOUS | Status: AC | PRN
  Administered 2014-06-15: 10 via INTRAVENOUS

## 2014-06-15 MED ORDER — TECHNETIUM TC 99M SESTAMIBI GENERIC - CARDIOLITE
30.0000 | Freq: Once | INTRAVENOUS | Status: AC | PRN
  Administered 2014-06-15: 30 via INTRAVENOUS

## 2014-06-15 MED ORDER — ADENOSINE (DIAGNOSTIC) 3 MG/ML IV SOLN
0.5600 mg/kg | Freq: Once | INTRAVENOUS | Status: AC
  Administered 2014-06-15: 30.9 mg via INTRAVENOUS

## 2014-06-15 NOTE — Progress Notes (Addendum)
[facility] SITE 3 NUCLEAR MED [address] [place], [place] [address] [phone]    Cardiology Nuclear Med Study  [name] is a 53 y.o. 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 IV 0.9% NS with Angio Cath:  22g  IV Site: R Antecubital x 1, tolerated well IV Started by:  Irean HongPatsy Edwards, RN  Chest Size (in):  34 Cup Size: C  Height: 5\' 4"  (1.626 m)  Weight:  122 lb (55.339 kg)  BMI:  Body mass index is 20.93 kg/(m^2). Tech Comments:  Patient held Coreg x 36 hrs. Irean HongPatsy Edwards, RN.    Nuclear Med Study 1 or 2 day study: 1 day  Stress Test Type:  Adenosine  Reading MD: N/A  Order Authorizing Provider:  Marca Anconaalton Mclean, MD, and Herma CarsonMichelle Lenze, [facility]  Resting Radionuclide: Technetium 7431m Sestamibi  Resting Radionuclide Dose: 11.0 mCi   Stress Radionuclide:  Technetium 5631m Sestamibi  Stress Radionuclide Dose: 33.0 mCi           Stress Protocol Rest HR: 73 Stress HR: 100  Rest BP: 125/90 Stress BP: 134/78  Exercise Time (min): n/a METS: n/a   Predicted Max HR: 168 bpm % Max HR: 59.52 bpm Rate Pressure Product: [id number]   Dose of Adenosine (mg):  31.1 Dose of Lexiscan: n/a mg  Dose of Atropine (mg): n/a Dose of Dobutamine: n/a mcg/kg/min (at max HR)  Stress Test Technologist: Irean HongPatsy Edwards, RN  Nuclear Technologist:  Harlow AsaElizabeth Young, CNMT     Rest Procedure:  Myocardial perfusion imaging was performed at rest 45 minutes  following the intravenous administration of Technetium 631m Sestamibi. Rest ECG: NSR-LBBB  Stress Procedure:  The patient received IV adenosine at 140 mcg/kg/min for 4 minutes. The patient complained of Chest Pain and warm sensation with Adenosine. Technetium 1931m Sestamibi was injected at the 2 minute mark and quantitative spect images were obtained after a 45 minute delay. Stress ECG: No significant change from baseline ECG  QPS Raw Data Images:  Normal; no motion artifact; normal heart/lung ratio. Stress Images:  There is decreased uptake in the anterior wall. Fixed apical defect. Rest Images:  Normal homogeneous uptake in all areas of the myocardium. Fixed apical defect. Subtraction (SDS):  These findings are consistent with ischemia. Transient Ischemic Dilatation (Normal <1.22):  1.11 Lung/Heart Ratio (Normal <0.45):  0.27  Quantitative Gated Spect Images QGS EDV:  122 ml QGS ESV:  64 ml  Impression Exercise Capacity:  Adenosine study with no exercise. BP Response:  Normal blood pressure response. Clinical Symptoms:  Mild chest pain/dyspnea. ECG Impression:  No significant ST segment change with adenosine. Comparison with Prior Nuclear Study: No previous nuclear study performed  Overall Impression:  Intermediate risk stress nuclear study with small area of reversible anterior ischemia..Fixed apical defect.  LV Ejection Fraction: 47%.  LV Wall Motion:  Mild anterior hypokinesis  Chrystie NoseKenneth C. Hilty, MD, [facility] Board Certified in Nuclear Cardiology Attending Cardiologist [facility]  Intermediate  risk Cardiolite with ischemia.  Given exertional chest pain, think she is going to need a cardiac cath.  Can arrange for next week or she can see me first to discuss.    [name] 06/16/2014 12:28 PM

## 2014-06-16 NOTE — Progress Notes (Signed)
Called patient with test results. She will see Dr.McLean tomorrow to discuss possible heart catherization.

## 2014-06-17 ENCOUNTER — Ambulatory Visit (INDEPENDENT_AMBULATORY_CARE_PROVIDER_SITE_OTHER): Payer: No Typology Code available for payment source | Admitting: Cardiology

## 2014-06-17 ENCOUNTER — Encounter: Payer: Self-pay | Admitting: *Deleted

## 2014-06-17 ENCOUNTER — Encounter: Payer: Self-pay | Admitting: Cardiology

## 2014-06-17 VITALS — BP 100/70 | HR 80 | Ht 64.0 in | Wt 126.0 lb

## 2014-06-17 DIAGNOSIS — Z8674 Personal history of sudden cardiac arrest: Secondary | ICD-10-CM

## 2014-06-17 DIAGNOSIS — R079 Chest pain, unspecified: Secondary | ICD-10-CM

## 2014-06-17 DIAGNOSIS — E785 Hyperlipidemia, unspecified: Secondary | ICD-10-CM

## 2014-06-17 DIAGNOSIS — F1010 Alcohol abuse, uncomplicated: Secondary | ICD-10-CM

## 2014-06-17 DIAGNOSIS — I428 Other cardiomyopathies: Secondary | ICD-10-CM

## 2014-06-17 LAB — LIPID PANEL
CHOL/HDL RATIO: 2
CHOLESTEROL: 177 mg/dL (ref 0–200)
HDL: 75 mg/dL (ref >39.00)
LDL CALC: 72 mg/dL (ref 0–99)
NONHDL: 102
Triglycerides: 151 mg/dL — ABNORMAL HIGH (ref 0.0–149.0)
VLDL: 30.2 mg/dL (ref 0.0–40.0)

## 2014-06-17 LAB — BASIC METABOLIC PANEL
BUN: 12 mg/dL (ref 6–23)
CHLORIDE: 105 meq/L (ref 96–112)
CO2: 27 meq/L (ref 19–32)
CREATININE: 0.7 mg/dL (ref 0.4–1.2)
Calcium: 9.4 mg/dL (ref 8.4–10.5)
GFR: 107.55 mL/min (ref >60.00)
Glucose, Bld: 110 mg/dL — ABNORMAL HIGH (ref 70–99)
Potassium: 4 mEq/L (ref 3.5–5.1)
Sodium: 139 mEq/L (ref 135–145)

## 2014-06-17 LAB — CBC WITH DIFFERENTIAL/PLATELET
BASOS PCT: 0.3 % (ref 0.0–3.0)
Basophils Absolute: 0 10*3/uL (ref 0.0–0.1)
EOS ABS: 0.1 10*3/uL (ref 0.0–0.7)
Eosinophils Relative: 1.3 % (ref 0.0–5.0)
HCT: 34.7 % — ABNORMAL LOW (ref 36.0–46.0)
HEMOGLOBIN: 11.3 g/dL — AB (ref 12.0–15.0)
Lymphocytes Relative: 26.6 % (ref 12.0–46.0)
Lymphs Abs: 2.7 10*3/uL (ref 0.7–4.0)
MCHC: 32.7 g/dL (ref 30.0–36.0)
MCV: 95.3 fl (ref 78.0–100.0)
MONO ABS: 0.4 10*3/uL (ref 0.1–1.0)
Monocytes Relative: 3.8 % (ref 3.0–12.0)
NEUTROS ABS: 7 10*3/uL (ref 1.4–7.7)
Neutrophils Relative %: 68 % (ref 43.0–77.0)
Platelets: 226 10*3/uL (ref 150.0–400.0)
RBC: 3.64 Mil/uL — ABNORMAL LOW (ref 3.87–5.11)
RDW: 15.4 % (ref 11.5–15.5)
WBC: 10.3 10*3/uL (ref 4.0–10.5)

## 2014-06-17 LAB — HEPATIC FUNCTION PANEL
ALT: 42 U/L — ABNORMAL HIGH (ref 0–35)
AST: 107 U/L — ABNORMAL HIGH (ref 0–37)
Albumin: 4.1 g/dL (ref 3.5–5.2)
Alkaline Phosphatase: 71 U/L (ref 39–117)
Bilirubin, Direct: 0 mg/dL (ref 0.0–0.3)
Total Bilirubin: 0.9 mg/dL (ref 0.2–1.2)
Total Protein: 7.7 g/dL (ref 6.0–8.3)

## 2014-06-17 LAB — PROTIME-INR
INR: 1 ratio (ref 0.8–1.0)
Prothrombin Time: 11.5 s (ref 9.6–13.1)

## 2014-06-17 MED ORDER — PREDNISONE 20 MG PO TABS: ORAL_TABLET | ORAL | Status: DC

## 2014-06-17 NOTE — Patient Instructions (Addendum)
Your physician recommends that you return for lab work today--BMET/CBCd/PT/INR/Lipid profile/liver profile.  Your physician has requested that you have a cardiac catheterization. Cardiac catheterization is used to diagnose and/or treat various heart conditions. Doctors may recommend this procedure for a number of different reasons. The most common reason is to evaluate chest pain. Chest pain can be a symptom of coronary artery disease (CAD), and cardiac catheterization can show whether plaque is narrowing or blocking your heart's arteries. This procedure is also used to evaluate the valves, as well as measure the blood flow and oxygen levels in different parts of your heart. For further information please visit [email or web address] Please follow instruction sheet, as given. Tuesday August 25,2015  Your physician recommends that you schedule a follow-up appointment with Dr Shirlee Latch in the [facility] clinic 3 weeks. Please schedule on a day that he is there.

## 2014-06-18 ENCOUNTER — Encounter (HOSPITAL_COMMUNITY): Payer: Self-pay | Admitting: Pharmacy Technician

## 2014-06-18 NOTE — Progress Notes (Signed)
[name], female   DOB: 10-14-1961, 53 y.o.   MRN: [id number] PCP: Dr. Delane Ginger  53 yo with history of ETOH abuse and prior substance abuse was admitted to [facility] in 3/14 after a cardiac arrest.  She was found down and underwent defibrillation in the field by EMS.  In the hospital, she remained in NSR.  She underwent hypothermia protocol.  LHC showed no CAD.  Initial echo showed EF 25% with moderate MR.  She recovered and was extubated.  Cardiac MRI done prior to discharge showed recovery of EF to 51% with small areas of basal inferior and mid inferolateral delayed enhancement.  Initially, she was hypokalemic.  She also reported having quit drinking a couple of days before the cardiac arrest.  She had been a heavy drinker prior. Followup echo was done in 6/14 showing recovery of LV function, EF 55%.   Patient was seen recently by PA in our office for exertional chest pain.  She has had chest pain walking to the store or walking up a hill for about 5 months.  The pain is a central tightness.  It will resolve with rest.  No symptoms at rest.  She had a Cardiolite done in 8/15 that was an intermediate risk study with EF 47% and reversible anterior defect.  Since last appointment, she has stopped walking long distances or up inclines and has not had chest pain.  She is short of breath with moderate exertion such as sweeping the floor.  She says that she quit smoking earlier this month.  She is drinking again, about 1 40 ounce beer/day.    Labs (4/14): K 3.7, creatinine 0.7, HCT 29.5 Labs (7/15): LDL 106, HDL 90 Labs (8/15): K 3.5, creatinine 0.62  ECG: NSR, LBBB   PMH: 1. B12 deficiency 2. ETOH abuse 3. LBBB 4. Prior drug abuse 5. Cardiac arrest: 3/14.  Defibrillated in the field.  Hypothermia protocol.  Initial echo with EF 25%, moderate eccentric MR.  cMRI done a week or so later showed EF 51%, mid to apical septal hypokinesis, normal RV, small area of delayed enhancement in the  basal inferior and mid inferolateral walls, mild MR.  LHC showed no CAD.  Echo (6/14) with EF 55%, mild septal hypokinesis, normal RV size and systolic function.  Lexiscan Cardiolite (8/15) with EF 47%, reversible anterior defect and fixed apical defect; intermediate risk.   SH: Ongoing ETOH abuse.  Prior drug abuse, now abstinent.  Quit smoking in 8/15.   FH: No CAD, no cardiomyopathy, no cardiac arrest.   ROS: All systems reviewed and negative except as per HPI.   Current Outpatient Prescriptions  Medication Sig Dispense Refill  . aspirin EC 81 MG tablet Take 81 mg by mouth daily.      Marland Kitchen atorvastatin (LIPITOR) 40 MG tablet Take 1 tablet (40 mg total) by mouth daily.  30 tablet  5  . carvedilol (COREG) 12.5 MG tablet Take 1 tablet (12.5 mg total) by mouth 2 (two) times daily.  60 tablet  3  . Cholecalciferol 1000 UNITS capsule Take 1 capsule (1,000 Units total) by mouth daily.  30 capsule  12  . famotidine (PEPCID) 20 MG tablet Take 1 tablet (20 mg total) by mouth 2 (two) times daily.  60 tablet  6  . folic acid (FOLVITE) 1 MG tablet Take 1 tablet (1 mg total) by mouth daily.  30 tablet  3  . lisinopril-hydrochlorothiazide (PRINZIDE,ZESTORETIC) 10-12.5 MG per tablet Take 1 tablet  by mouth daily.  30 tablet  3  . loratadine (CLARITIN) 10 MG tablet Take 10 mg by mouth daily as needed for allergies.       . nitroGLYCERIN (NITROSTAT) 0.4 MG SL tablet Place 1 tablet (0.4 mg total) under the tongue every 5 (five) minutes as needed for chest pain.  25 tablet  5  . thiamine 100 MG tablet Take 1 tablet (100 mg total) by mouth daily.  30 tablet  12  . venlafaxine XR (EFFEXOR-XR) 75 MG 24 hr capsule Take 1 capsule (75 mg total) by mouth daily. For hot flashes  30 capsule  2  . vitamin B-12 (CYANOCOBALAMIN) 1000 MCG tablet Take 1,000 mcg by mouth daily.       No current facility-administered medications for this visit.    BP 100/70  Pulse 80  Ht 5\' 4"  (1.626 m)  Wt 126 lb (57.153 kg)  BMI 21.62  kg/m2  LMP 11/02/2010 General: NAD Neck: No JVD, no thyromegaly or thyroid nodule.  Lungs: Clear to auscultation bilaterally with normal respiratory effort. CV: Nondisplaced PMI.  Heart regular S1/S2, no S3/S4, no murmur.  No peripheral edema.  No carotid bruit.  Normal pedal pulses.  Abdomen: Soft, nontender, no hepatosplenomegaly, no distention.   Neurologic: Alert and oriented x 3.  Psych: Normal affect. Extremities: No clubbing or cyanosis.   Assessment/Plan: 1. Cardiac arrest:  Cause of initial arrest in 2014 is somewhat uncertain.  No CAD at that time.  Initial EF was 25% by echo but she had recovery of EF to 51% by cardiac MRI by the time of discharge, so the initial low EF may have been related to myocardial stunning from the arrest.  She was hypokalemic when she first arrived to the ER.  Also, talking to her later on, it sounds like she had quit drinking 2-3 days prior to the arrest.  She was a heavy drinker before this.  It is possible that the arrest could be related to an adrenergic surge from ETOH withdrawal.  She had to be treated for withdrawal prior to extubation.  Repeat echo in 6/14 showed normal EF, so ICD was not placed. 2. ETOH abuse: Drinking again.  I strongly encouraged her to quit.  3. Cardiomyopathy: EF had recovered to normal in 6/14 but was 47% on recent Cardiolite.  I will have her get an echocardiogram to confirm EF.  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  She will continue Coreg and lisinopril.  She is not volume overloaded on exam.  4. CAD: Patient has had concerning exertional chest pain for several months and had an intermediate risk stress Cardiolite with anterior ischemia.  Her LHC in 3/14 showed no significant CAD.  Given symptoms concerning for angina and abnormal Cardiolite, I think that she needs repeat LHC.  She will need contrast allergy prophylaxis.   Marca AnconaDalton Seville Downs 06/18/2014

## 2014-06-23 ENCOUNTER — Other Ambulatory Visit (HOSPITAL_COMMUNITY): Payer: Self-pay | Admitting: Cardiology

## 2014-06-23 ENCOUNTER — Ambulatory Visit (HOSPITAL_COMMUNITY)
Admission: RE | Admit: 2014-06-23 | Discharge: 2014-06-23 | Disposition: A | Discharge status: Home / Self Care | Payer: No Typology Code available for payment source | Source: Ambulatory Visit | Attending: Cardiology | Admitting: Cardiology

## 2014-06-23 ENCOUNTER — Encounter (HOSPITAL_COMMUNITY)
Admission: RE | Disposition: A | Discharge status: Home / Self Care | Payer: Self-pay | Source: Ambulatory Visit | Attending: Cardiology

## 2014-06-23 DIAGNOSIS — I447 Left bundle-branch block, unspecified: Secondary | ICD-10-CM | POA: Insufficient documentation

## 2014-06-23 DIAGNOSIS — R9439 Abnormal result of other cardiovascular function study: Secondary | ICD-10-CM | POA: Insufficient documentation

## 2014-06-23 DIAGNOSIS — Z87891 Personal history of nicotine dependence: Secondary | ICD-10-CM | POA: Insufficient documentation

## 2014-06-23 DIAGNOSIS — R079 Chest pain, unspecified: Secondary | ICD-10-CM | POA: Insufficient documentation

## 2014-06-23 DIAGNOSIS — R0602 Shortness of breath: Secondary | ICD-10-CM | POA: Insufficient documentation

## 2014-06-23 DIAGNOSIS — F1910 Other psychoactive substance abuse, uncomplicated: Secondary | ICD-10-CM | POA: Insufficient documentation

## 2014-06-23 DIAGNOSIS — F1010 Alcohol abuse, uncomplicated: Secondary | ICD-10-CM | POA: Insufficient documentation

## 2014-06-23 DIAGNOSIS — E538 Deficiency of other specified B group vitamins: Secondary | ICD-10-CM | POA: Insufficient documentation

## 2014-06-23 DIAGNOSIS — Z8674 Personal history of sudden cardiac arrest: Secondary | ICD-10-CM | POA: Insufficient documentation

## 2014-06-23 DIAGNOSIS — Z7982 Long term (current) use of aspirin: Secondary | ICD-10-CM | POA: Insufficient documentation

## 2014-06-23 DIAGNOSIS — R072 Precordial pain: Secondary | ICD-10-CM

## 2014-06-23 DIAGNOSIS — I428 Other cardiomyopathies: Secondary | ICD-10-CM | POA: Insufficient documentation

## 2014-06-23 HISTORY — PX: LEFT HEART CATHETERIZATION WITH CORONARY ANGIOGRAM: SHX5451

## 2014-06-23 SURGERY — LEFT HEART CATHETERIZATION WITH CORONARY ANGIOGRAM
Anesthesia: LOCAL

## 2014-06-23 MED ORDER — FAMOTIDINE IN NACL 20-0.9 MG/50ML-% IV SOLN
INTRAVENOUS | Status: AC
  Filled 2014-06-23: qty 50

## 2014-06-23 MED ORDER — ASPIRIN 81 MG PO CHEW
CHEWABLE_TABLET | ORAL | Status: AC
  Filled 2014-06-23: qty 1

## 2014-06-23 MED ORDER — SODIUM CHLORIDE 0.9 % IV SOLN: 250.0000 mL | INTRAVENOUS | Status: DC | PRN

## 2014-06-23 MED ORDER — VERAPAMIL HCL 2.5 MG/ML IV SOLN
INTRAVENOUS | Status: AC
  Filled 2014-06-23: qty 2

## 2014-06-23 MED ORDER — ACETAMINOPHEN 325 MG PO TABS: 650.0000 mg | ORAL_TABLET | ORAL | Status: DC | PRN

## 2014-06-23 MED ORDER — SODIUM CHLORIDE 0.9 % IJ SOLN
3.0000 mL | INTRAMUSCULAR | Status: DC | PRN
  Administered 2014-06-23: 3 mL via INTRAVENOUS

## 2014-06-23 MED ORDER — PREDNISONE 20 MG PO TABS: 60.0000 mg | ORAL_TABLET | ORAL | Status: DC

## 2014-06-23 MED ORDER — HEPARIN SODIUM (PORCINE) 1000 UNIT/ML IJ SOLN
INTRAMUSCULAR | Status: AC
  Filled 2014-06-23: qty 1

## 2014-06-23 MED ORDER — FAMOTIDINE IN NACL 20-0.9 MG/50ML-% IV SOLN
20.0000 mg | INTRAVENOUS | Status: AC
  Administered 2014-06-23: 20 mg via INTRAVENOUS

## 2014-06-23 MED ORDER — MIDAZOLAM HCL 2 MG/2ML IJ SOLN
INTRAMUSCULAR | Status: AC
  Filled 2014-06-23: qty 2

## 2014-06-23 MED ORDER — SODIUM CHLORIDE 0.9 % IV SOLN
INTRAVENOUS | Status: DC
  Administered 2014-06-23: 06:00:00 via INTRAVENOUS

## 2014-06-23 MED ORDER — SODIUM CHLORIDE 0.9 % IV SOLN: INTRAVENOUS | Status: AC

## 2014-06-23 MED ORDER — ONDANSETRON HCL 4 MG/2ML IJ SOLN: 4.0000 mg | Freq: Four times a day (QID) | INTRAMUSCULAR | Status: DC | PRN

## 2014-06-23 MED ORDER — ASPIRIN 81 MG PO CHEW
81.0000 mg | CHEWABLE_TABLET | ORAL | Status: AC
  Administered 2014-06-23: 81 mg via ORAL

## 2014-06-23 MED ORDER — DIPHENHYDRAMINE HCL 50 MG/ML IJ SOLN
25.0000 mg | INTRAMUSCULAR | Status: AC
  Administered 2014-06-23: 25 mg via INTRAVENOUS

## 2014-06-23 MED ORDER — HEPARIN (PORCINE) IN NACL 2-0.9 UNIT/ML-% IJ SOLN
INTRAMUSCULAR | Status: AC
  Filled 2014-06-23: qty 1500

## 2014-06-23 MED ORDER — FENTANYL CITRATE 0.05 MG/ML IJ SOLN
INTRAMUSCULAR | Status: AC
  Filled 2014-06-23: qty 2

## 2014-06-23 MED ORDER — SODIUM CHLORIDE 0.9 % IJ SOLN: 3.0000 mL | Freq: Two times a day (BID) | INTRAMUSCULAR | Status: DC

## 2014-06-23 MED ORDER — DIPHENHYDRAMINE HCL 50 MG/ML IJ SOLN
INTRAMUSCULAR | Status: AC
  Filled 2014-06-23: qty 1

## 2014-06-23 NOTE — H&P (View-Only) (Signed)
[name], female   DOB: 10-14-1961, 53 y.o.   MRN: [id number] PCP: Dr. Delane Ginger  53 yo with history of ETOH abuse and prior substance abuse was admitted to [facility] in 3/14 after a cardiac arrest.  She was found down and underwent defibrillation in the field by EMS.  In the hospital, she remained in NSR.  She underwent hypothermia protocol.  LHC showed no CAD.  Initial echo showed EF 25% with moderate MR.  She recovered and was extubated.  Cardiac MRI done prior to discharge showed recovery of EF to 51% with small areas of basal inferior and mid inferolateral delayed enhancement.  Initially, she was hypokalemic.  She also reported having quit drinking a couple of days before the cardiac arrest.  She had been a heavy drinker prior. Followup echo was done in 6/14 showing recovery of LV function, EF 55%.   Patient was seen recently by PA in our office for exertional chest pain.  She has had chest pain walking to the store or walking up a hill for about 5 months.  The pain is a central tightness.  It will resolve with rest.  No symptoms at rest.  She had a Cardiolite done in 8/15 that was an intermediate risk study with EF 47% and reversible anterior defect.  Since last appointment, she has stopped walking long distances or up inclines and has not had chest pain.  She is short of breath with moderate exertion such as sweeping the floor.  She says that she quit smoking earlier this month.  She is drinking again, about 1 40 ounce beer/day.    Labs (4/14): K 3.7, creatinine 0.7, HCT 29.5 Labs (7/15): LDL 106, HDL 90 Labs (8/15): K 3.5, creatinine 0.62  ECG: NSR, LBBB   PMH: 1. B12 deficiency 2. ETOH abuse 3. LBBB 4. Prior drug abuse 5. Cardiac arrest: 3/14.  Defibrillated in the field.  Hypothermia protocol.  Initial echo with EF 25%, moderate eccentric MR.  cMRI done a week or so later showed EF 51%, mid to apical septal hypokinesis, normal RV, small area of delayed enhancement in the  basal inferior and mid inferolateral walls, mild MR.  LHC showed no CAD.  Echo (6/14) with EF 55%, mild septal hypokinesis, normal RV size and systolic function.  Lexiscan Cardiolite (8/15) with EF 47%, reversible anterior defect and fixed apical defect; intermediate risk.   SH: Ongoing ETOH abuse.  Prior drug abuse, now abstinent.  Quit smoking in 8/15.   FH: No CAD, no cardiomyopathy, no cardiac arrest.   ROS: All systems reviewed and negative except as per HPI.   Current Outpatient Prescriptions  Medication Sig Dispense Refill  . aspirin EC 81 MG tablet Take 81 mg by mouth daily.      Marland Kitchen atorvastatin (LIPITOR) 40 MG tablet Take 1 tablet (40 mg total) by mouth daily.  30 tablet  5  . carvedilol (COREG) 12.5 MG tablet Take 1 tablet (12.5 mg total) by mouth 2 (two) times daily.  60 tablet  3  . Cholecalciferol 1000 UNITS capsule Take 1 capsule (1,000 Units total) by mouth daily.  30 capsule  12  . famotidine (PEPCID) 20 MG tablet Take 1 tablet (20 mg total) by mouth 2 (two) times daily.  60 tablet  6  . folic acid (FOLVITE) 1 MG tablet Take 1 tablet (1 mg total) by mouth daily.  30 tablet  3  . lisinopril-hydrochlorothiazide (PRINZIDE,ZESTORETIC) 10-12.5 MG per tablet Take 1 tablet  by mouth daily.  30 tablet  3  . loratadine (CLARITIN) 10 MG tablet Take 10 mg by mouth daily as needed for allergies.       . nitroGLYCERIN (NITROSTAT) 0.4 MG SL tablet Place 1 tablet (0.4 mg total) under the tongue every 5 (five) minutes as needed for chest pain.  25 tablet  5  . thiamine 100 MG tablet Take 1 tablet (100 mg total) by mouth daily.  30 tablet  12  . venlafaxine XR (EFFEXOR-XR) 75 MG 24 hr capsule Take 1 capsule (75 mg total) by mouth daily. For hot flashes  30 capsule  2  . vitamin B-12 (CYANOCOBALAMIN) 1000 MCG tablet Take 1,000 mcg by mouth daily.       No current facility-administered medications for this visit.    BP 100/70  Pulse 80  Ht  (1.626 m)  Wt 126 lb (57.153 kg)  BMI 21.62  kg/m2  LMP 11/02/2010 General: NAD Neck: No JVD, no thyromegaly or thyroid nodule.  Lungs: Clear to auscultation bilaterally with normal respiratory effort. CV: Nondisplaced PMI.  Heart regular S1/S2, no S3/S4, no murmur.  No peripheral edema.  No carotid bruit.  Normal pedal pulses.  Abdomen: Soft, nontender, no hepatosplenomegaly, no distention.   Neurologic: Alert and oriented x 3.  Psych: Normal affect. Extremities: No clubbing or cyanosis.   Assessment/Plan: 1. Cardiac arrest:  Cause of initial arrest in 2014 is somewhat uncertain.  No CAD at that time.  Initial EF was 25% by echo but she had recovery of EF to 51% by cardiac MRI by the time of discharge, so the initial low EF may have been related to myocardial stunning from the arrest.  She was hypokalemic when she first arrived to the ER.  Also, talking to her later on, it sounds like she had quit drinking 2-3 days prior to the arrest.  She was a heavy drinker before this.  It is possible that the arrest could be related to an adrenergic surge from ETOH withdrawal.  She had to be treated for withdrawal prior to extubation.  Repeat echo in 6/14 showed normal EF, so ICD was not placed. 2. ETOH abuse: Drinking again.  I strongly encouraged her to quit.  3. Cardiomyopathy: EF had recovered to normal in 6/14 but was 47% on recent Cardiolite.  I will have her get an echocardiogram to confirm EF.  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  She will continue Coreg and lisinopril.  She is not volume overloaded on exam.  4. CAD: Patient has had concerning exertional chest pain for several months and had an intermediate risk stress Cardiolite with anterior ischemia.  Her LHC in 3/14 showed no significant CAD.  Given symptoms concerning for angina and abnormal Cardiolite, I think that she needs repeat LHC.  She will need contrast allergy prophylaxis.   [name] 06/18/2014

## 2014-06-23 NOTE — CV Procedure (Addendum)
    Cardiac Catheterization Procedure Note  Name: [name] MRN: [id number] DOB: 1961/03/22  Procedure: Left Heart Cath, Selective Coronary Angiography, LV angiography  Indication: Exertional chest pain, abnormal Cardiolite.    Procedural Details: The right wrist was prepped, draped, and anesthetized with 1% lidocaine. Using the modified Seldinger technique, a 5/6 French Slender sheath was introduced into the right radial artery. 3 mg of verapamil was administered through the sheath, weight-based unfractionated heparin was administered intravenously. Standard Judkins catheters were used for selective coronary angiography and left ventriculography. Catheter exchanges were performed over an exchange length guidewire. There were no immediate procedural complications. A TR band was used for radial hemostasis at the completion of the procedure.  The patient was transferred to the post catheterization recovery area for further monitoring.  Procedural Findings: Hemodynamics: AO 153/83 LV 164/26  Coronary angiography: Coronary dominance: right  Left mainstem: Long, no significant disease.   Left anterior descending (LAD): No angiographic coronary disease.   Left circumflex (LCx): No angiographic coronary disease.  Right coronary artery (RCA): No angiographic coronary disease.  Left ventriculography: Left ventricular systolic function is low normal to mildly reduced, LVEF is estimated at 50-55%, there is no significant mitral regurgitation   Contrast: 50 cc  Final Conclusions:   No angiographic CAD, EF 50-55%.  Suspect false positive Cardiolite. LVEDP is a bit elevated, may be source of her exertional symptoms. Will start low dose of Lasix, 20 mg daily with KCl 20 mEq daily.  She will followup 2 wks post-cath.   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  She says she has quit drinking again.   [name] MD, [facility] 06/23/2014, 8:19 AM

## 2014-06-23 NOTE — Interval H&P Note (Signed)
Cath Lab Visit (complete for each Cath Lab visit)  Clinical Evaluation Leading to the Procedure:   ACS: No.  Non-ACS:    Anginal Classification: CCS III  Anti-ischemic medical therapy: Minimal Therapy (1 class of medications)  Non-Invasive Test Results: Intermediate-risk stress test findings: cardiac mortality 1-3%/year  Prior CABG: No previous CABG      History and Physical Interval Note:  06/23/2014 7:47 AM  [name]  has presented today for surgery, with the diagnosis of heart failure  The various methods of treatment have been discussed with the patient and family. After consideration of risks, benefits and other options for treatment, the patient has consented to  Procedure(s): LEFT HEART CATHETERIZATION WITH CORONARY ANGIOGRAM (N/A) as a surgical intervention .  The patient's history has been reviewed, patient examined, no change in status, stable for surgery.  I have reviewed the patient's chart and labs.  Questions were answered to the patient's satisfaction.     [name] [employer]

## 2014-06-23 NOTE — Discharge Instructions (Signed)
Radial Site Care °Refer to this sheet in the next few weeks. These instructions provide you with information on caring for yourself after your procedure. Your caregiver may also give you more specific instructions. Your treatment has been planned according to current medical practices, but problems sometimes occur. Call your caregiver if you have any problems or questions after your procedure. °HOME CARE INSTRUCTIONS °· You may shower the day after the procedure. Remove the bandage (dressing) and gently wash the site with plain soap and water. Gently pat the site dry. °· Do not apply powder or lotion to the site. °· Do not submerge the affected site in water for 3 to 5 days. °· Inspect the site at least twice daily. °· Do not flex or bend the affected arm for 24 hours. °· No lifting over 5 pounds (2.3 kg) for 5 days after your procedure. °· Do not drive home if you are discharged the same day of the procedure. Have someone else drive you. °· You may drive 24 hours after the procedure unless otherwise instructed by your caregiver. °· Do not operate machinery or power tools for 24 hours. °· A responsible adult should be with you for the first 24 hours after you arrive home. °What to expect: °· Any bruising will usually fade within 1 to 2 weeks. °· Blood that collects in the tissue (hematoma) may be painful to the touch. It should usually decrease in size and tenderness within 1 to 2 weeks. °SEEK IMMEDIATE MEDICAL CARE IF: °· You have unusual pain at the radial site. °· You have redness, warmth, swelling, or pain at the radial site. °· You have drainage (other than a small amount of blood on the dressing). °· You have chills. °· You have a fever or persistent symptoms for more than 72 hours. °· You have a fever and your symptoms suddenly get worse. °· Your arm becomes pale, cool, tingly, or numb. °· You have heavy bleeding from the site. Hold pressure on the site. °Document Released: 11/18/2010 Document Revised:  01/08/2012 Document Reviewed: 11/18/2010 °ExitCare® Patient Information ©2015 [employer]. This information is not intended to replace advice given to you by your health care provider. Make sure you discuss any questions you have with your health care provider. ° °

## 2014-07-14 ENCOUNTER — Ambulatory Visit (HOSPITAL_COMMUNITY)
Admission: RE | Admit: 2014-07-14 | Discharge: 2014-07-14 | Disposition: A | Discharge status: Home / Self Care | Payer: No Typology Code available for payment source | Source: Ambulatory Visit | Attending: Cardiology | Admitting: Cardiology

## 2014-07-14 VITALS — BP 92/60 | HR 78 | Ht 64.0 in | Wt 128.0 lb

## 2014-07-14 DIAGNOSIS — I447 Left bundle-branch block, unspecified: Secondary | ICD-10-CM | POA: Insufficient documentation

## 2014-07-14 DIAGNOSIS — Z79899 Other long term (current) drug therapy: Secondary | ICD-10-CM | POA: Insufficient documentation

## 2014-07-14 DIAGNOSIS — Z8674 Personal history of sudden cardiac arrest: Secondary | ICD-10-CM | POA: Insufficient documentation

## 2014-07-14 DIAGNOSIS — Z7982 Long term (current) use of aspirin: Secondary | ICD-10-CM | POA: Insufficient documentation

## 2014-07-14 DIAGNOSIS — Z87891 Personal history of nicotine dependence: Secondary | ICD-10-CM | POA: Insufficient documentation

## 2014-07-14 DIAGNOSIS — F1010 Alcohol abuse, uncomplicated: Secondary | ICD-10-CM

## 2014-07-14 DIAGNOSIS — E538 Deficiency of other specified B group vitamins: Secondary | ICD-10-CM | POA: Insufficient documentation

## 2014-07-14 DIAGNOSIS — I428 Other cardiomyopathies: Secondary | ICD-10-CM | POA: Insufficient documentation

## 2014-07-14 DIAGNOSIS — I509 Heart failure, unspecified: Secondary | ICD-10-CM | POA: Insufficient documentation

## 2014-07-14 DIAGNOSIS — F1911 Other psychoactive substance abuse, in remission: Secondary | ICD-10-CM | POA: Insufficient documentation

## 2014-07-14 DIAGNOSIS — R0789 Other chest pain: Secondary | ICD-10-CM | POA: Insufficient documentation

## 2014-07-14 DIAGNOSIS — F1021 Alcohol dependence, in remission: Secondary | ICD-10-CM | POA: Insufficient documentation

## 2014-07-14 LAB — PRO B NATRIURETIC PEPTIDE: PRO B NATRI PEPTIDE: 57.5 pg/mL (ref 0–125)

## 2014-07-14 LAB — HEPATIC FUNCTION PANEL
ALT: 57 U/L — ABNORMAL HIGH (ref 0–35)
AST: 78 U/L — ABNORMAL HIGH (ref 0–37)
Albumin: 3.8 g/dL (ref 3.5–5.2)
Alkaline Phosphatase: 65 U/L (ref 39–117)
Bilirubin, Direct: <0.2 mg/dL (ref 0.0–0.3)
TOTAL PROTEIN: 7.4 g/dL (ref 6.0–8.3)
Total Bilirubin: 0.3 mg/dL (ref 0.3–1.2)

## 2014-07-14 LAB — BASIC METABOLIC PANEL
Anion gap: 16 — ABNORMAL HIGH (ref 5–15)
BUN: 17 mg/dL (ref 6–23)
CHLORIDE: 107 meq/L (ref 96–112)
CO2: 23 mEq/L (ref 19–32)
Calcium: 9.4 mg/dL (ref 8.4–10.5)
Creatinine, Ser: 0.65 mg/dL (ref 0.50–1.10)
GFR calc non Af Amer: >90 mL/min (ref >90)
Glucose, Bld: 75 mg/dL (ref 70–99)
POTASSIUM: 4.2 meq/L (ref 3.7–5.3)
Sodium: 146 mEq/L (ref 137–147)

## 2014-07-14 MED ORDER — FUROSEMIDE 20 MG PO TABS: 20.0000 mg | ORAL_TABLET | Freq: Every day | ORAL | Status: DC

## 2014-07-14 NOTE — Progress Notes (Signed)
[name], female   DOB: 1961-07-04, 53 y.o.   MRN: [id number] PCP: Dr. Delane Ginger  53 yo with history of ETOH abuse and prior substance abuse was admitted to [facility] in 3/14 after a cardiac arrest.  She was found down and underwent defibrillation in the field by EMS.  In the hospital, she remained in NSR.  She underwent hypothermia protocol.  LHC showed no CAD.  Initial echo showed EF 25% with moderate MR.  She recovered and was extubated.  Cardiac MRI done prior to discharge showed recovery of EF to 51% with small areas of basal inferior and mid inferolateral delayed enhancement.  Initially, she was hypokalemic.  She also reported having quit drinking a couple of days before the cardiac arrest.  She had been a heavy drinker prior. Followup echo was done in 6/14 showing recovery of LV function, EF 55%.   Patient was seen recently by PA in our office for exertional chest pain.  She had a Cardiolite done in 8/15 that was an intermediate risk study with EF 47% and reversible anterior defect.  She had started drinking and smoking again.  I took her for [facility] in 8/15.  This showed no angiographic CAD with EF 50-55% by LV-gram. LVEDP was elevated, and I started her on Lasix, thinking that some degree of volume overload may be the cause of her exertional symptoms.   Currently, she is doing better.  No exertional dypsnea when walking on flat ground or up steps. No further chest pain.  She has quit drinking and smoking again.    Labs (4/14): K 3.7, creatinine 0.7, HCT 29.5 Labs (7/15): LDL 106, HDL 90 Labs (8/15): K 3.5, creatinine 0.62, LDL 72, AST 107, ALT 42  ECG: NSR, LBBB   PMH: 1. B12 deficiency 2. ETOH abuse 3. LBBB 4. Prior drug abuse 5. Cardiac arrest: 3/14.  Defibrillated in the field.  Hypothermia protocol.  Initial echo with EF 25%, moderate eccentric MR.  cMRI done a week or so later showed EF 51%, mid to apical septal hypokinesis, normal RV, small area of delayed enhancement in  the basal inferior and mid inferolateral walls, mild MR.  LHC showed no CAD.  Echo (6/14) with EF 55%, mild septal hypokinesis, normal RV size and systolic function.  Lexiscan Cardiolite (8/15) with EF 47%, reversible anterior defect and fixed apical defect; intermediate risk.   SH: ETOH abuse, quit again in 8/15.  Prior drug abuse, now abstinent.  Quit smoking in 8/15.   FH: No CAD, no cardiomyopathy, no cardiac arrest.   ROS: All systems reviewed and negative except as per HPI.   Current Outpatient Prescriptions  Medication Sig Dispense Refill  . aspirin EC 81 MG tablet Take 81 mg by mouth daily.      Marland Kitchen atorvastatin (LIPITOR) 40 MG tablet Take 1 tablet (40 mg total) by mouth daily.  30 tablet  5  . carvedilol (COREG) 12.5 MG tablet Take 1 tablet (12.5 mg total) by mouth 2 (two) times daily.  60 tablet  3  . Cholecalciferol 1000 UNITS capsule Take 1 capsule (1,000 Units total) by mouth daily.  30 capsule  12  . famotidine (PEPCID) 20 MG tablet Take 1 tablet (20 mg total) by mouth 2 (two) times daily.  60 tablet  6  . folic acid (FOLVITE) 1 MG tablet Take 1 tablet (1 mg total) by mouth daily.  30 tablet  3  . lisinopril-hydrochlorothiazide (PRINZIDE,ZESTORETIC) 10-12.5 MG per tablet Take 1  tablet by mouth daily.  30 tablet  3  . loratadine (CLARITIN) 10 MG tablet Take 10 mg by mouth daily as needed for allergies.       . nitroGLYCERIN (NITROSTAT) 0.4 MG SL tablet Place 1 tablet (0.4 mg total) under the tongue every 5 (five) minutes as needed for chest pain.  25 tablet  5  . thiamine 100 MG tablet Take 1 tablet (100 mg total) by mouth daily.  30 tablet  12  . venlafaxine XR (EFFEXOR-XR) 75 MG 24 hr capsule Take 1 capsule (75 mg total) by mouth daily. For hot flashes  30 capsule  2  . vitamin B-12 (CYANOCOBALAMIN) 1000 MCG tablet Take 1,000 mcg by mouth daily.      . furosemide (LASIX) 20 MG tablet Take 1 tablet (20 mg total) by mouth daily.  30 tablet  2   No current facility-administered  medications for this encounter.    BP 92/60  Pulse 78  Ht 5\' 4"  (1.626 m)  Wt 128 lb (58.06 kg)  BMI 21.96 kg/m2  SpO2 100%  LMP 11/02/2010 General: NAD Neck: No JVD, no thyromegaly or thyroid nodule.  Lungs: Clear to auscultation bilaterally with normal respiratory effort. CV: Nondisplaced PMI.  Heart regular S1/S2, no S3/S4, no murmur.  No peripheral edema.  No carotid bruit.  Normal pedal pulses.  Abdomen: Soft, nontender, no hepatosplenomegaly, no distention.   Neurologic: Alert and oriented x 3.  Psych: Normal affect. Extremities: No clubbing or cyanosis. 2+ right radial pulse, cath site benign.   Assessment/Plan: 1. Cardiac arrest:  Cause of initial arrest in 2014 is somewhat uncertain.  No CAD at that time.  Initial EF was 25% by echo but she had recovery of EF to 51% by cardiac MRI by the time of discharge, so the initial low EF may have been related to myocardial stunning from the arrest.  She was hypokalemic when she first arrived to the ER.  Also, talking to her later on, it sounds like she had quit drinking 2-3 days prior to the arrest.  She was a heavy drinker before this.  It is possible that the arrest could be related to an adrenergic surge from ETOH withdrawal.  She had to be treated for withdrawal prior to extubation.  Repeat echo in 6/14 showed normal EF, so ICD was not placed. 2. ETOH abuse: She had started drinking again earlier this summer but now has quit again. I suspect elevated LFTs in 8/15 were due to ETOH hepatitis.  I will repeat LFTs today (she has quit drinking).  3. Cardiomyopathy: EF had recovered to normal in 6/14 but was 47% on recent Cardiolite.  She had started drinking again, so this may be an ETOH-mediated cardiomyopathy.  She will continue Coreg and lisinopril.  LVEDP was elevated at time of LHC, so I started her on Lasix 20 mg daily with improvement in symptoms (now NYHA class II). Continue current Lasix. Check BMET/BNP today.  4. CAD: Patient had had  concerning exertional chest pain for several months and had an intermediate risk stress Cardiolite with anterior ischemia.  LHC (8/15) showed no angiographic CAD. Exertional symptoms improved after starting Lasix.   Followup in 6 months with echo.    [name] 07/14/2014

## 2014-07-14 NOTE — Patient Instructions (Signed)
Labs today  Your physician recommends that you schedule a follow-up appointment in: 2 months with echocardiogram  Do the following things EVERYDAY: 1) Weigh yourself in the morning before breakfast. Write it down and keep it in a log. 2) Take your medicines as prescribed 3) Eat low salt foods-Limit salt (sodium) to 2000 mg per day.  4) Stay as active as you can everyday 5) Limit all fluids for the day to less than 2 liters 6)

## 2014-07-20 ENCOUNTER — Other Ambulatory Visit: Payer: No Typology Code available for payment source

## 2014-07-21 ENCOUNTER — Encounter: Payer: Self-pay | Admitting: Internal Medicine

## 2014-07-22 ENCOUNTER — Other Ambulatory Visit: Payer: Self-pay | Admitting: *Deleted

## 2014-07-22 MED ORDER — VENLAFAXINE HCL ER 75 MG PO CP24: 75.0000 mg | ORAL_CAPSULE | Freq: Every day | ORAL | Status: DC

## 2014-07-23 ENCOUNTER — Other Ambulatory Visit: Payer: Self-pay | Admitting: *Deleted

## 2014-07-23 NOTE — Telephone Encounter (Signed)
Open In error. 

## 2014-07-23 NOTE — Telephone Encounter (Signed)
Rx called in to pharmacy. Stanton Kidney Dave Mergen RN 07/23/14 3:30PM

## 2014-08-24 ENCOUNTER — Encounter: Payer: Self-pay | Admitting: Internal Medicine

## 2014-08-24 ENCOUNTER — Ambulatory Visit (INDEPENDENT_AMBULATORY_CARE_PROVIDER_SITE_OTHER): Payer: Self-pay | Admitting: Internal Medicine

## 2014-08-24 VITALS — BP 134/71 | HR 81 | Temp 98.0°F | Ht 62.0 in | Wt 126.9 lb

## 2014-08-24 DIAGNOSIS — Z23 Encounter for immunization: Secondary | ICD-10-CM

## 2014-08-24 DIAGNOSIS — Z0000 Encounter for general adult medical examination without abnormal findings: Secondary | ICD-10-CM

## 2014-08-24 DIAGNOSIS — I428 Other cardiomyopathies: Secondary | ICD-10-CM

## 2014-08-24 DIAGNOSIS — I429 Cardiomyopathy, unspecified: Secondary | ICD-10-CM

## 2014-08-24 DIAGNOSIS — F1910 Other psychoactive substance abuse, uncomplicated: Secondary | ICD-10-CM

## 2014-08-24 DIAGNOSIS — N951 Menopausal and female climacteric states: Secondary | ICD-10-CM

## 2014-08-24 DIAGNOSIS — I10 Essential (primary) hypertension: Secondary | ICD-10-CM

## 2014-08-24 LAB — COMPLETE METABOLIC PANEL WITH GFR
ALBUMIN: 4.9 g/dL (ref 3.5–5.2)
ALT: 27 U/L (ref 0–35)
AST: 30 U/L (ref 0–37)
Alkaline Phosphatase: 67 U/L (ref 39–117)
BUN: 19 mg/dL (ref 6–23)
CALCIUM: 9.9 mg/dL (ref 8.4–10.5)
CHLORIDE: 104 meq/L (ref 96–112)
CO2: 24 meq/L (ref 19–32)
Creat: 0.74 mg/dL (ref 0.50–1.10)
Glucose, Bld: 82 mg/dL (ref 70–99)
Potassium: 4.1 mEq/L (ref 3.5–5.3)
SODIUM: 140 meq/L (ref 135–145)
TOTAL PROTEIN: 8.3 g/dL (ref 6.0–8.3)
Total Bilirubin: 0.4 mg/dL (ref 0.2–1.2)

## 2014-08-24 MED ORDER — POTASSIUM CHLORIDE ER 10 MEQ PO TBCR: 10.0000 meq | EXTENDED_RELEASE_TABLET | Freq: Every day | ORAL | Status: DC

## 2014-08-24 NOTE — Assessment & Plan Note (Addendum)
LHC in September 2015 showed no CAD.  Will have repeat echo on 09/15/14.  No CP today.  Follows with Dr. Shirlee Latch.  LHC in August 2015 showed no CAD.  He started her on lasix 20mg  daily which has improved her dyspnea.  She did however, report having an episode a couple of weeks ago where she was walking back from the store with a friend and felt dizzy and passed out.  She reports hurting her knees but no other injuries.  She denies any other symptoms at the time--no CP, SOB, palpitations.  She denies ever having experienced this before.  Denies drinking alcohol at the time.  Unclear the etiology but asked her to call the [facility] to be seen if this happens again.  -requested she call and come to the [facility] to be seen if she experiences another episode of dizziness and syncope  -continue current meds -check CMP

## 2014-08-24 NOTE — Progress Notes (Signed)
[name], female   DOB: 1960/12/25, 53 y.o.   MRN: [id number]    Subjective:   [name] female    DOB: 10-Apr-1961 53 y.o.    MRN: [id number] Health Maintenance Due: Health Maintenance Due  Topic Date Due  . Influenza Vaccine  05/30/2014    _________________________________________________  HPI: [name] is a 53 y.o. female here for a routine visit.  Pt has a PMH outlined below.  Please see problem-based charting assessment and plan note for further details of medical issues addressed at today's visit.  PMH: Past Medical History  Diagnosis Date  . Rectal bleeding 2007    internal hemmorhoids by colonoscopy in 04/2006, Bx neg for IBD  . Duodenitis     secondary to H pylori(treatment completed 05/2006), +/- NSAIDS  . Alcoholism   . History of cocaine abuse   . PE 05/16/2009    CT angio positive for PE in 07/10, treated with coumadin for 8 months.   Marland Kitchen ETOH abuse   . Anxiety   . Depression   . Pulmonary embolism     a. 04/2009 - treated with coumadin x 8 mos.  . DVT (deep venous thrombosis)     a. 04/2009 - treated with coumadin x 8 mos.  . Rectal bleeding     a. int hemorrhoids by colonoscopy in 04/2006, Bx neg for IBD  . Duodenitis     a. 05/2006 2/2 H pylori +/- NSAIDS  . History of CVA (cerebrovascular accident)   . Tobacco abuse   . Chest pain     a. 04/2009 Echo: EF 55-60%, Gr1 DD, Mild MR.  Marland Kitchen Hypertension   . Heart murmur   . Stroke   . Seizures   . ANEMIA, VITAMIN B12 DEFICIENCY 11/11/2007    Documented low 175 on 09/2007; received 6 yrs of IM therapy; switched to po 05/2013     Medications: Current Outpatient Prescriptions on File Prior to Visit  Medication Sig Dispense Refill  . aspirin EC 81 MG tablet Take 81 mg by mouth daily.      Marland Kitchen atorvastatin (LIPITOR) 40 MG tablet Take 1 tablet (40 mg total) by mouth daily.  30 tablet  5  . carvedilol (COREG) 12.5 MG tablet Take 1 tablet (12.5 mg total) by mouth 2 (two) times  daily.  60 tablet  3  . Cholecalciferol 1000 UNITS capsule Take 1 capsule (1,000 Units total) by mouth daily.  30 capsule  12  . famotidine (PEPCID) 20 MG tablet Take 1 tablet (20 mg total) by mouth 2 (two) times daily.  60 tablet  6  . folic acid (FOLVITE) 1 MG tablet Take 1 tablet (1 mg total) by mouth daily.  30 tablet  3  . furosemide (LASIX) 20 MG tablet Take 1 tablet (20 mg total) by mouth daily.  30 tablet  2  . lisinopril-hydrochlorothiazide (PRINZIDE,ZESTORETIC) 10-12.5 MG per tablet Take 1 tablet by mouth daily.  30 tablet  3  . loratadine (CLARITIN) 10 MG tablet Take 10 mg by mouth daily as needed for allergies.       . nitroGLYCERIN (NITROSTAT) 0.4 MG SL tablet Place 1 tablet (0.4 mg total) under the tongue every 5 (five) minutes as needed for chest pain.  25 tablet  5  . thiamine 100 MG tablet Take 1 tablet (100 mg total) by mouth daily.  30 tablet  12  . venlafaxine XR (EFFEXOR-XR) 75 MG 24 hr capsule Take 1  capsule (75 mg total) by mouth daily. For hot flashes  30 capsule  3  . vitamin B-12 (CYANOCOBALAMIN) 1000 MCG tablet Take 1,000 mcg by mouth daily.       No current facility-administered medications on file prior to visit.    Allergies: Allergies  Allergen Reactions  . Iodinated Diagnostic Agents Other (See Comments)    Seizure  . Omnipaque [Iohexol] Other (See Comments)    Seizure   . Omnipaque [Iohexol] Other (See Comments)    Seizure     FH: Family History  Problem Relation Age of Onset  . Coronary artery disease      1 st degree female relative <60 and female <50  . Diabetes type II      1 st degree relative  . Breast cancer Mother   . Heart disease Mother   . Heart disease Father   . Heart disease Brother   . Breast cancer      1st degree relative <50  . Diabetes Mother   . CAD Mother   . Colon cancer Father   . CAD Father   . Breast cancer Sister   . Diabetes Sister   . Diabetes Brother   . CAD Brother   . Heart attack Mother   . Hypertension  Mother   . Hypertension Sister     SH: History   Social History  . Marital Status: Single    Spouse Name: N/A    Number of Children: 2  . Years of Education: N/A   Occupational History  . Unemployed    Social History Main Topics  . Smoking status: Former Smoker -- 0.10 packs/day    Types: Cigarettes    Quit date: 01/16/2013  . Smokeless tobacco: Never Used     Comment: smokes about 1-2 cigs/day  . Alcohol Use: Yes     Comment: beer  . Drug Use: No     Comment: former  . Sexual Activity: None   Other Topics Concern  . None   Social History Narrative   ** Merged History Encounter **       ** Data from: 12/05/12 Enc Dept: [facility]   No cocaine or alcohol use since hospital discharge 05/19/2009.      10/13 update:  States she is smoking 1-2 cigarettes daily with at least 1 drink daily usually Brandy.                  ** Data from: 01/17/13 Enc Dept: [facility]   Pt lives in [place] with a roommate.          Review of Systems: Constitutional: Negative for fever, chills and weight loss.  Eyes: Negative for blurred vision.  Respiratory: Negative for cough and shortness of breath.  Cardiovascular: Negative for chest pain, palpitations and leg swelling.  Gastrointestinal: Negative for nausea, vomiting, abdominal pain, diarrhea, constipation and blood in stool.  Genitourinary: Negative for dysuria, urgency and frequency.  Musculoskeletal: Negative for myalgias and back pain.  Neurological: +dizziness, -weakness and -headaches.     Objective:   Vital Signs: Filed Vitals:   08/24/14 1325  BP: 134/71  Pulse: 81  Temp: 98 F (36.7 C)  TempSrc: Oral  Height: 5\' 2"  (1.575 m)  Weight: 126 lb 14.4 oz (57.561 kg)  SpO2: 100%     BP Readings from Last 3 Encounters:  08/24/14 134/71  07/14/14 92/60  06/23/14 148/92    Physical Exam: Constitutional: Vital signs reviewed.  Patient is well-developed and well-nourished in NAD and cooperative with  exam.  Head: Normocephalic and atraumatic. Eyes: PERRL, EOMI, conjunctivae nl, no scleral icterus.  Neck: Supple. Cardiovascular: RRR, no MRG. Pulmonary/Chest: normal effort, non-tender to palpation, CTAB, no wheezes, rales, or rhonchi. Abdominal: Soft. NT/ND +BS. Neurological: A&O x3, cranial nerves II-XII are grossly intact, moving all extremities. Extremities: 2+DP b/l; no pitting edema. Skin: Warm, dry and intact. No rash.  Assessment & Plan:   Assessment and plan was discussed and formulated with my attending.

## 2014-08-24 NOTE — Patient Instructions (Addendum)
Thank you for your visit today.   Please return to the internal medicine clinic in 6 month(s) or sooner if needed.   Influenza vaccine today. Will check your lab work today to make sure your electrolytes are OK.    Your current medical regimen is effective;  continue present plan and take all medications as prescribed.    Please be sure to bring all of your medications with you to every visit; this includes herbal supplements, vitamins, eye drops, and any over-the-counter medications.   Should you have any questions regarding your medications and/or any new or worsening symptoms, please be sure to call the clinic at [phone].   If you believe that you are suffering from a life threatening condition or one that may result in the loss of limb or function, then you should call 911 or proceed to the nearest Emergency Department.     Insomnia Insomnia is frequent trouble falling and/or staying asleep. Insomnia can be a long term problem or a short term problem. Both are common. Insomnia can be a short term problem when the wakefulness is related to a certain stress or worry. Long term insomnia is often related to ongoing stress during waking hours and/or poor sleeping habits. Overtime, sleep deprivation itself can make the problem worse. Every little thing feels more severe because you are overtired and your ability to cope is decreased. CAUSES   Stress, anxiety, and depression.  Poor sleeping habits.  Distractions such as TV in the bedroom.  Naps close to bedtime.  Engaging in emotionally charged conversations before bed.  Technical reading before sleep.  Alcohol and other sedatives. They may make the problem worse. They can hurt normal sleep patterns and normal dream activity.  Stimulants such as caffeine for several hours prior to bedtime.  Pain syndromes and shortness of breath can cause insomnia.  Exercise late at night.  Changing time zones may cause sleeping problems  (jet lag). It is sometimes helpful to have someone observe your sleeping patterns. They should look for periods of not breathing during the night (sleep apnea). They should also look to see how long those periods last. If you live alone or observers are uncertain, you can also be observed at a sleep clinic where your sleep patterns will be professionally monitored. Sleep apnea requires a checkup and treatment. Give your caregivers your medical history. Give your caregivers observations your family has made about your sleep.  SYMPTOMS   Not feeling rested in the morning.  Anxiety and restlessness at bedtime.  Difficulty falling and staying asleep. TREATMENT   Your caregiver may prescribe treatment for an underlying medical disorders. Your caregiver can give advice or help if you are using alcohol or other drugs for self-medication. Treatment of underlying problems will usually eliminate insomnia problems.  Medications can be prescribed for short time use. They are generally not recommended for lengthy use.  Over-the-counter sleep medicines are not recommended for lengthy use. They can be habit forming.  You can promote easier sleeping by making lifestyle changes such as:  Using relaxation techniques that help with breathing and reduce muscle tension.  Exercising earlier in the day.  Changing your diet and the time of your last meal. No night time snacks.  Establish a regular time to go to bed.  Counseling can help with stressful problems and worry.  Soothing music and white noise may be helpful if there are background noises you cannot remove.  Stop tedious detailed work at least one hour before  bedtime. HOME CARE INSTRUCTIONS   Keep a diary. Inform your caregiver about your progress. This includes any medication side effects. See your caregiver regularly. Take note of:  Times when you are asleep.  Times when you are awake during the night.  The quality of your sleep.  How  you feel the next day. This information will help your caregiver care for you.  Get out of bed if you are still awake after 15 minutes. Read or do some quiet activity. Keep the lights down. Wait until you feel sleepy and go back to bed.  Keep regular sleeping and waking hours. Avoid naps.  Exercise regularly.  Avoid distractions at bedtime. Distractions include watching television or engaging in any intense or detailed activity like attempting to balance the household checkbook.  Develop a bedtime ritual. Keep a familiar routine of bathing, brushing your teeth, climbing into bed at the same time each night, listening to soothing music. Routines increase the success of falling to sleep faster.  Use relaxation techniques. This can be using breathing and muscle tension release routines. It can also include visualizing peaceful scenes. You can also help control troubling or intruding thoughts by keeping your mind occupied with boring or repetitive thoughts like the old concept of counting sheep. You can make it more creative like imagining planting one beautiful flower after another in your backyard garden.  During your day, work to eliminate stress. When this is not possible use some of the previous suggestions to help reduce the anxiety that accompanies stressful situations. MAKE SURE YOU:   Understand these instructions.  Will watch your condition.  Will get help right away if you are not doing well or get worse. Document Released: 10/13/2000 Document Revised: 01/08/2012 Document Reviewed: 11/13/2007 [facility] Patient Information 2015 [place], [place]. This information is not intended to replace advice given to you by your health care provider. Make sure you discuss any questions you have with your health care provider.

## 2014-08-24 NOTE — Assessment & Plan Note (Signed)
BP Readings from Last 3 Encounters:  08/24/14 134/71  07/14/14 92/60  06/23/14 148/92    Lab Results  Component Value Date   NA 146 07/14/2014   K 4.2 07/14/2014   CREATININE 0.65 07/14/2014    Assessment: Blood pressure control: controlled Progress toward BP goal:  at goal Comments: recently started lasix 20mg  by Dr. Shirlee Latch  Plan: Medications:  continue current medications of zestoretic 10-12.5mg , coreg 12.5mg  twice daily, and lasix 20mg  daily

## 2014-08-24 NOTE — Assessment & Plan Note (Signed)
Pt reports quitting alcohol and smoking.  However, it may be difficult to remain smoke-free as her grown children who live with her smoke.  -congratulated pt on quitting smoking and alcohol use

## 2014-08-24 NOTE — Assessment & Plan Note (Addendum)
Pt reports having difficulty staying asleep.  She denies drinking caffeine close to bedtime, taking daytime naps, or using her bed for any other activities.  Reports that her children come in and out during all hours of the night at that may also be contributing.  She used to take effexor at night which used to help with her sleep but now takes it during the day.   -discussed sleep hygiene (given info on this) -suggested that she take her effexor close to bedtime instead of during the day -avoid caffeine, exercise or other strenuous activities close to bedtime

## 2014-08-24 NOTE — Assessment & Plan Note (Signed)
-  influenza vaccine given today -will obtain records from previous colonoscopy (done in [place])

## 2014-08-25 NOTE — Progress Notes (Signed)
Case discussed with Dr. Gill soon after the resident saw the patient.  We reviewed the resident's history and exam and pertinent patient test results.  I agree with the assessment, diagnosis, and plan of care documented in the resident's note. 

## 2014-09-15 ENCOUNTER — Ambulatory Visit (HOSPITAL_COMMUNITY)
Admission: RE | Admit: 2014-09-15 | Discharge: 2014-09-15 | Disposition: A | Discharge status: Home / Self Care | Payer: Self-pay | Source: Ambulatory Visit | Attending: Internal Medicine | Admitting: Internal Medicine

## 2014-09-15 ENCOUNTER — Encounter (HOSPITAL_COMMUNITY): Payer: Self-pay

## 2014-09-15 ENCOUNTER — Ambulatory Visit (HOSPITAL_BASED_OUTPATIENT_CLINIC_OR_DEPARTMENT_OTHER)
Admission: RE | Admit: 2014-09-15 | Discharge: 2014-09-15 | Disposition: A | Discharge status: Home / Self Care | Payer: Self-pay | Source: Ambulatory Visit | Attending: Cardiology | Admitting: Cardiology

## 2014-09-15 VITALS — BP 100/62 | HR 90 | Wt 130.0 lb

## 2014-09-15 DIAGNOSIS — E876 Hypokalemia: Secondary | ICD-10-CM | POA: Insufficient documentation

## 2014-09-15 DIAGNOSIS — E538 Deficiency of other specified B group vitamins: Secondary | ICD-10-CM | POA: Insufficient documentation

## 2014-09-15 DIAGNOSIS — F1010 Alcohol abuse, uncomplicated: Secondary | ICD-10-CM | POA: Insufficient documentation

## 2014-09-15 DIAGNOSIS — I429 Cardiomyopathy, unspecified: Secondary | ICD-10-CM

## 2014-09-15 DIAGNOSIS — R072 Precordial pain: Secondary | ICD-10-CM

## 2014-09-15 DIAGNOSIS — Z87891 Personal history of nicotine dependence: Secondary | ICD-10-CM | POA: Insufficient documentation

## 2014-09-15 DIAGNOSIS — Z87898 Personal history of other specified conditions: Secondary | ICD-10-CM | POA: Insufficient documentation

## 2014-09-15 DIAGNOSIS — I509 Heart failure, unspecified: Secondary | ICD-10-CM

## 2014-09-15 DIAGNOSIS — I2510 Atherosclerotic heart disease of native coronary artery without angina pectoris: Secondary | ICD-10-CM | POA: Insufficient documentation

## 2014-09-15 DIAGNOSIS — I428 Other cardiomyopathies: Secondary | ICD-10-CM

## 2014-09-15 DIAGNOSIS — Z8674 Personal history of sudden cardiac arrest: Secondary | ICD-10-CM

## 2014-09-15 DIAGNOSIS — Z79899 Other long term (current) drug therapy: Secondary | ICD-10-CM | POA: Insufficient documentation

## 2014-09-15 DIAGNOSIS — I447 Left bundle-branch block, unspecified: Secondary | ICD-10-CM | POA: Insufficient documentation

## 2014-09-15 DIAGNOSIS — I340 Nonrheumatic mitral (valve) insufficiency: Secondary | ICD-10-CM | POA: Insufficient documentation

## 2014-09-15 DIAGNOSIS — I469 Cardiac arrest, cause unspecified: Secondary | ICD-10-CM | POA: Insufficient documentation

## 2014-09-15 DIAGNOSIS — Z7982 Long term (current) use of aspirin: Secondary | ICD-10-CM | POA: Insufficient documentation

## 2014-09-15 MED ORDER — LISINOPRIL 5 MG PO TABS: 5.0000 mg | ORAL_TABLET | Freq: Every day | ORAL | Status: DC

## 2014-09-15 MED ORDER — FUROSEMIDE 20 MG PO TABS: 20.0000 mg | ORAL_TABLET | Freq: Every day | ORAL | Status: DC

## 2014-09-15 NOTE — Progress Notes (Signed)
[name], female   DOB: 12-14-1960, 53 y.o.   MRN: [id number] PCP: Dr. Delane Ginger  53 yo with history of ETOH abuse and prior substance abuse was admitted to [facility] in 3/14 after a cardiac arrest.  She was found down and underwent defibrillation in the field by EMS.  In the hospital, she remained in NSR.  She underwent hypothermia protocol.  LHC showed no CAD.  Initial echo showed EF 25% with moderate MR.  She recovered and was extubated.  Cardiac MRI done prior to discharge showed recovery of EF to 51% with small areas of basal inferior and mid inferolateral delayed enhancement.  Initially, she was hypokalemic.  She also reported having quit drinking a couple of days before the cardiac arrest.  She had been a heavy drinker prior. Followup echo was done in 6/14 showing recovery of LV function, EF 55%.   She had a Cardiolite done in 8/15 that was an intermediate risk study with EF 47% and reversible anterior defect.  She had started drinking and smoking again.  I took her for [facility] in 8/15.  This showed no angiographic CAD with EF 50-55% by LV-gram. LVEDP was elevated, and I started her on Lasix, thinking that some degree of volume overload may be the cause of her exertional symptoms.   She is doing well currently.  No exertional dypsnea when walking on flat ground or up steps. No further chest pain.  She is not drinking or smoking.  She has occasional lightheadedness with standing and has fallen once due to "dizziness."   I reviewed today's echo; EF is 55% with septal-lateral dyssynchrony and moderate eccentric MR.    Labs (4/14): K 3.7, creatinine 0.7, HCT 29.5 Labs (7/15): LDL 106, HDL 90 Labs (8/15): K 3.5, creatinine 0.62, LDL 72, AST 107, ALT 42 Labs (10/15): K 4.1, creatinine 0.74  ECG: NSR, LBBB   PMH: 1. B12 deficiency 2. ETOH abuse 3. LBBB 4. Prior drug abuse 5. Cardiac arrest: 3/14.  Defibrillated in the field.  Hypothermia protocol.  Initial echo with EF 25%, moderate  eccentric MR.  cMRI done a week or so later showed EF 51%, mid to apical septal hypokinesis, normal RV, small area of delayed enhancement in the basal inferior and mid inferolateral walls, mild MR.  LHC showed no CAD.  Echo (6/14) with EF 55%, mild septal hypokinesis, normal RV size and systolic function.  Lexiscan Cardiolite (8/15) with EF 47%, reversible anterior defect and fixed apical defect; intermediate risk.  Echo (11/15) with EF 55%, septal-lateral dyssynchrony, moderate eccentric MR.  6. Mitral regurgitation: Echo in 11/15 with moderate eccentric MR.   SH: ETOH abuse, quit again in 8/15.  Prior drug abuse, now abstinent.  Quit smoking in 8/15.   FH: No CAD, no cardiomyopathy, no cardiac arrest.   ROS: All systems reviewed and negative except as per HPI.   Current Outpatient Prescriptions  Medication Sig Dispense Refill  . aspirin EC 81 MG tablet Take 81 mg by mouth daily.    Marland Kitchen atorvastatin (LIPITOR) 40 MG tablet Take 1 tablet (40 mg total) by mouth daily. 30 tablet 5  . carvedilol (COREG) 12.5 MG tablet Take 1 tablet (12.5 mg total) by mouth 2 (two) times daily. 60 tablet 3  . Cholecalciferol 1000 UNITS capsule Take 1 capsule (1,000 Units total) by mouth daily. 30 capsule 12  . famotidine (PEPCID) 20 MG tablet Take 1 tablet (20 mg total) by mouth 2 (two) times daily. 60 tablet  6  . folic acid (FOLVITE) 1 MG tablet Take 1 tablet (1 mg total) by mouth daily. 30 tablet 3  . furosemide (LASIX) 20 MG tablet Take 1 tablet (20 mg total) by mouth daily. As needed 30 tablet 2  . loratadine (CLARITIN) 10 MG tablet Take 10 mg by mouth daily as needed for allergies.     . nitroGLYCERIN (NITROSTAT) 0.4 MG SL tablet Place 1 tablet (0.4 mg total) under the tongue every 5 (five) minutes as needed for chest pain. 25 tablet 5  . potassium chloride (K-DUR) 10 MEQ tablet Take 1 tablet (10 mEq total) by mouth daily. 30 tablet 0  . thiamine 100 MG tablet Take 1 tablet (100 mg total) by mouth daily. 30 tablet  12  . venlafaxine XR (EFFEXOR-XR) 75 MG 24 hr capsule Take 1 capsule (75 mg total) by mouth daily. For hot flashes 30 capsule 3  . vitamin B-12 (CYANOCOBALAMIN) 1000 MCG tablet Take 1,000 mcg by mouth daily.    Marland Kitchen. lisinopril (PRINIVIL,ZESTRIL) 5 MG tablet Take 1 tablet (5 mg total) by mouth daily. 30 tablet 3   No current facility-administered medications for this encounter.    BP 100/62 mmHg  Pulse 90  Wt 130 lb (58.968 kg)  SpO2 99%  LMP 11/02/2010 General: NAD Neck: No JVD, no thyromegaly or thyroid nodule.  Lungs: Clear to auscultation bilaterally with normal respiratory effort. CV: Nondisplaced PMI.  Heart regular S1/S2, no S3/S4, 2/6 HSM at apex.  No peripheral edema.  No carotid bruit.  Normal pedal pulses.  Abdomen: Soft, nontender, no hepatosplenomegaly, no distention.   Neurologic: Alert and oriented x 3.  Psych: Normal affect. Extremities: No clubbing or cyanosis. 2+ right radial pulse, cath site benign.   Assessment/Plan: 1. Cardiac arrest:  Cause of initial arrest in 2014 is somewhat uncertain.  No CAD at that time.  Initial EF was 25% by echo but she had recovery of EF to 51% by cardiac MRI by the time of discharge, so the initial low EF may have been related to myocardial stunning from the arrest.  She was hypokalemic when she first arrived to the ER.  Also, talking to her later on, it sounds like she had quit drinking 2-3 days prior to the arrest.  She was a heavy drinker before this.  It is possible that the arrest could be related to an adrenergic surge from ETOH withdrawal.  She had to be treated for withdrawal prior to extubation.  Repeat echo in 6/14 showed normal EF, so ICD was not placed. 2. ETOH abuse: She had started drinking again earlier this year but now has quit again. I suspect elevated LFTs in 8/15 were due to ETOH hepatitis. 3. Cardiomyopathy: EF had recovered to normal in 6/14 but was 47% on 8/15 Cardiolite.  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  She has quit drinking and EF is 55% on echo today.  She will continue Coreg.  Given lightheadedness with standing on occasion, I will have her stop lisinopril/HCTZ and start a lower dose of lisinopril, 5 mg daily. I think she can also stop Lasix.    4. CAD: Patient had had concerning exertional chest pain for several months and had an intermediate risk stress Cardiolite with anterior ischemia in 8/15.  LHC (8/15) showed no angiographic CAD. Chest pain has resolved.  5. Mitral regurgitation: Moderate, eccentric on today's echo.  Repeat an echo in 1-2 years to follow.    Followup  in 6 months    [name] 09/15/2014

## 2014-09-15 NOTE — Patient Instructions (Signed)
STOP Lisinopril/Hydrocholathizide START Lisinopril 5 mg daily CHANGE Lasix to as needed  Your physician recommends that you schedule a follow-up appointment in: 6 months with echocardiogram  Do the following things EVERYDAY: 1) Weigh yourself in the morning before breakfast. Write it down and keep it in a log. 2) Take your medicines as prescribed 3) Eat low salt foods-Limit salt (sodium) to 2000 mg per day.  4) Stay as active as you can everyday 5) Limit all fluids for the day to less than 2 liters 6)

## 2014-09-15 NOTE — Progress Notes (Signed)
  Echocardiogram 2D Echocardiogram has been performed.  Georgian Co 09/15/2014, 10:31 AM

## 2014-10-08 ENCOUNTER — Encounter (HOSPITAL_COMMUNITY): Payer: Self-pay | Admitting: Cardiology

## 2014-12-17 ENCOUNTER — Other Ambulatory Visit: Payer: Self-pay | Admitting: *Deleted

## 2014-12-17 MED ORDER — CARVEDILOL 12.5 MG PO TABS: 12.5000 mg | ORAL_TABLET | Freq: Two times a day (BID) | ORAL | Status: DC

## 2014-12-21 NOTE — Telephone Encounter (Signed)
done

## 2015-02-22 ENCOUNTER — Emergency Department (HOSPITAL_COMMUNITY)
Admission: EM | Admit: 2015-02-22 | Discharge: 2015-02-23 | Disposition: A | Discharge status: Home / Self Care | Payer: Self-pay | Attending: Emergency Medicine | Admitting: Emergency Medicine

## 2015-02-22 ENCOUNTER — Encounter (HOSPITAL_COMMUNITY): Payer: Self-pay

## 2015-02-22 ENCOUNTER — Emergency Department (HOSPITAL_COMMUNITY): Payer: Self-pay

## 2015-02-22 DIAGNOSIS — R0602 Shortness of breath: Secondary | ICD-10-CM | POA: Insufficient documentation

## 2015-02-22 DIAGNOSIS — R079 Chest pain, unspecified: Secondary | ICD-10-CM | POA: Insufficient documentation

## 2015-02-22 DIAGNOSIS — F329 Major depressive disorder, single episode, unspecified: Secondary | ICD-10-CM | POA: Insufficient documentation

## 2015-02-22 DIAGNOSIS — I10 Essential (primary) hypertension: Secondary | ICD-10-CM | POA: Insufficient documentation

## 2015-02-22 DIAGNOSIS — Z87891 Personal history of nicotine dependence: Secondary | ICD-10-CM | POA: Insufficient documentation

## 2015-02-22 DIAGNOSIS — Z86711 Personal history of pulmonary embolism: Secondary | ICD-10-CM | POA: Insufficient documentation

## 2015-02-22 DIAGNOSIS — F1410 Cocaine abuse, uncomplicated: Secondary | ICD-10-CM | POA: Insufficient documentation

## 2015-02-22 DIAGNOSIS — Z8673 Personal history of transient ischemic attack (TIA), and cerebral infarction without residual deficits: Secondary | ICD-10-CM | POA: Insufficient documentation

## 2015-02-22 DIAGNOSIS — Z86718 Personal history of other venous thrombosis and embolism: Secondary | ICD-10-CM | POA: Insufficient documentation

## 2015-02-22 DIAGNOSIS — Z79899 Other long term (current) drug therapy: Secondary | ICD-10-CM | POA: Insufficient documentation

## 2015-02-22 DIAGNOSIS — Z9889 Other specified postprocedural states: Secondary | ICD-10-CM | POA: Insufficient documentation

## 2015-02-22 DIAGNOSIS — F419 Anxiety disorder, unspecified: Secondary | ICD-10-CM | POA: Insufficient documentation

## 2015-02-22 DIAGNOSIS — I252 Old myocardial infarction: Secondary | ICD-10-CM | POA: Insufficient documentation

## 2015-02-22 DIAGNOSIS — R011 Cardiac murmur, unspecified: Secondary | ICD-10-CM | POA: Insufficient documentation

## 2015-02-22 DIAGNOSIS — D519 Vitamin B12 deficiency anemia, unspecified: Secondary | ICD-10-CM | POA: Insufficient documentation

## 2015-02-22 LAB — CBC
HEMATOCRIT: 37.1 % (ref 36.0–46.0)
HEMOGLOBIN: 12.9 g/dL (ref 12.0–15.0)
MCH: 33.1 pg (ref 26.0–34.0)
MCHC: 34.8 g/dL (ref 30.0–36.0)
MCV: 95.1 fL (ref 78.0–100.0)
Platelets: 285 10*3/uL (ref 150–400)
RBC: 3.9 MIL/uL (ref 3.87–5.11)
RDW: 16 % — ABNORMAL HIGH (ref 11.5–15.5)
WBC: 6.8 10*3/uL (ref 4.0–10.5)

## 2015-02-22 LAB — BASIC METABOLIC PANEL
ANION GAP: 18 — AB (ref 5–15)
BUN: 18 mg/dL (ref 6–23)
CHLORIDE: 104 mmol/L (ref 96–112)
CO2: 15 mmol/L — AB (ref 19–32)
Calcium: 8.8 mg/dL (ref 8.4–10.5)
Creatinine, Ser: 0.9 mg/dL (ref 0.50–1.10)
GFR calc Af Amer: 84 mL/min — ABNORMAL LOW (ref >90)
GFR, EST NON AFRICAN AMERICAN: 72 mL/min — AB (ref >90)
Glucose, Bld: 58 mg/dL — ABNORMAL LOW (ref 70–99)
POTASSIUM: 3.9 mmol/L (ref 3.5–5.1)
SODIUM: 137 mmol/L (ref 135–145)

## 2015-02-22 LAB — I-STAT TROPONIN, ED: TROPONIN I, POC: 0.01 ng/mL (ref 0.00–0.08)

## 2015-02-22 NOTE — ED Provider Notes (Cosign Needed)
CSN: [id number]     Arrival date & time 02/22/15  2232 History   First MD Initiated Contact with Patient 02/22/15 2237     Chief Complaint  Patient presents with  . Chest Pain     (Consider location/radiation/quality/duration/timing/severity/associated sxs/prior Treatment) Patient is a 54 y.o. female presenting with chest pain. The history is provided by the patient and medical records. No language interpreter was used.  Chest Pain Associated symptoms: shortness of breath   Associated symptoms: no abdominal pain, no back pain, no cough, no diaphoresis, no fatigue, no fever, no headache, no nausea and not vomiting      [name] is a 54 y.o. female  with a hx of MI, alcoholism, cocaine abuse, DVT, PE, retention, stroke, anemia, seizures presents to the Emergency Department complaining of gradual, persistent, progressively worsening chest tightness with associated shortness of breath onset one hour ago.  Patient reports that she was lying down when her chest pain started however it was very soon after she smoked a cigarette. Patient reports she knows she is not supposed to be smoking cigarettes and is trying to stop however she "just really wanted one."  She denies cocaine usage tonight.  Patient reports she has a history of MI last year and that today's chest pain feels different.  Port that she did not have surgery or stent placement after her MI last year. She is followed by Dr. Shirlee Latch at [facility].. Patient denies fever, chills, headache, neck pain, neck stiffness, diaphoresis, abdominal pain, nausea, vomiting, diarrhea, weakness, dizziness, syncope, dysuria.  She denies diarrhea or alleviating factors.  Patient was given aspirin by EMS prior to arrival.     Past Medical History  Diagnosis Date  . Rectal bleeding 2007    internal hemmorhoids by colonoscopy in 04/2006, Bx neg for IBD  . Duodenitis     secondary to H pylori(treatment completed 05/2006), +/- NSAIDS  . Alcoholism   . History  of cocaine abuse   . PE 05/16/2009    CT angio positive for PE in 07/10, treated with coumadin for 8 months.   Marland Kitchen ETOH abuse   . Anxiety   . Depression   . Pulmonary embolism     a. 04/2009 - treated with coumadin x 8 mos.  . DVT (deep venous thrombosis)     a. 04/2009 - treated with coumadin x 8 mos.  . Rectal bleeding     a. int hemorrhoids by colonoscopy in 04/2006, Bx neg for IBD  . Duodenitis     a. 05/2006 2/2 H pylori +/- NSAIDS  . History of CVA (cerebrovascular accident)   . Tobacco abuse   . Chest pain     a. 04/2009 Echo: EF 55-60%, Gr1 DD, Mild MR.  Marland Kitchen Hypertension   . Heart murmur   . Stroke   . Seizures   . ANEMIA, VITAMIN B12 DEFICIENCY 11/11/2007    Documented low 175 on 09/2007; received 6 yrs of IM therapy; switched to po 05/2013    Past Surgical History  Procedure Laterality Date  . Cesarean section    . Cardiac catheterization    . Left heart catheterization with coronary angiogram N/A 06/23/2014    Procedure: LEFT HEART CATHETERIZATION WITH CORONARY ANGIOGRAM;  Surgeon: Laurey Morale, MD;  Location: [facility] CATH LAB;  Service: Cardiovascular;  Laterality: N/A;   Family History  Problem Relation Age of Onset  . Coronary artery disease      1 st degree female relative <60 and  female <50  . Diabetes type II      1 st degree relative  . Breast cancer Mother   . Heart disease Mother   . Heart disease Father   . Heart disease Brother   . Breast cancer      1st degree relative <50  . Diabetes Mother   . CAD Mother   . Colon cancer Father   . CAD Father   . Breast cancer Sister   . Diabetes Sister   . Diabetes Brother   . CAD Brother   . Heart attack Mother   . Hypertension Mother   . Hypertension Sister    History  Substance Use Topics  . Smoking status: Former Smoker -- 0.10 packs/day    Types: Cigarettes    Quit date: 01/16/2013  . Smokeless tobacco: Never Used     Comment: smokes about 1-2 cigs/day  . Alcohol Use: Yes     Comment: beer   OB History     No data available     Review of Systems  Constitutional: Negative for fever, diaphoresis, appetite change, fatigue and unexpected weight change.  HENT: Negative for mouth sores.   Eyes: Negative for visual disturbance.  Respiratory: Positive for shortness of breath. Negative for cough, chest tightness and wheezing.   Cardiovascular: Positive for chest pain.  Gastrointestinal: Negative for nausea, vomiting, abdominal pain, diarrhea and constipation.  Endocrine: Negative for polydipsia, polyphagia and polyuria.  Genitourinary: Negative for dysuria, urgency, frequency and hematuria.  Musculoskeletal: Negative for back pain and neck stiffness.  Skin: Negative for rash.  Allergic/Immunologic: Negative for immunocompromised state.  Neurological: Negative for syncope, light-headedness and headaches.  Hematological: Does not bruise/bleed easily.  Psychiatric/Behavioral: Negative for sleep disturbance. The patient is not nervous/anxious.       Allergies  Iodinated diagnostic agents; Omnipaque; and Omnipaque  Home Medications   Prior to Admission medications   Medication Sig Start Date End Date Taking? Authorizing Provider  atorvastatin (LIPITOR) 40 MG tablet Take 1 tablet (40 mg total) by mouth daily. 06/08/14  Yes Dyann Kief, PA-C  carvedilol (COREG) 12.5 MG tablet Take 1 tablet (12.5 mg total) by mouth 2 (two) times daily. 12/17/14 12/17/15 Yes Margarito Liner, MD  Cholecalciferol 1000 UNITS capsule Take 1 capsule (1,000 Units total) by mouth daily. 11/03/13  Yes Marrian Salvage, MD  famotidine (PEPCID) 20 MG tablet Take 1 tablet (20 mg total) by mouth 2 (two) times daily. 06/02/14  Yes Marrian Salvage, MD  lisinopril (PRINIVIL,ZESTRIL) 5 MG tablet Take 1 tablet (5 mg total) by mouth daily. 09/15/14  Yes Laurey Morale, MD  loratadine (CLARITIN) 10 MG tablet Take 10 mg by mouth daily as needed for allergies.    Yes Historical Provider, MD  nitroGLYCERIN (NITROSTAT) 0.4 MG SL tablet Place  1 tablet (0.4 mg total) under the tongue every 5 (five) minutes as needed for chest pain. 06/08/14  Yes Dyann Kief, PA-C  potassium chloride (K-DUR) 10 MEQ tablet Take 1 tablet (10 mEq total) by mouth daily. 08/24/14  Yes Marrian Salvage, MD  thiamine 100 MG tablet Take 1 tablet (100 mg total) by mouth daily. 03/30/14  Yes Marrian Salvage, MD  folic acid (FOLVITE) 1 MG tablet Take 1 tablet (1 mg total) by mouth daily. Patient not taking: Reported on 02/22/2015 06/02/14   Marrian Salvage, MD  furosemide (LASIX) 20 MG tablet Take 1 tablet (20 mg total) by mouth daily. As needed Patient not taking: Reported  on 02/22/2015 09/15/14   Laurey Morale, MD  venlafaxine XR (EFFEXOR-XR) 75 MG 24 hr capsule Take 1 capsule (75 mg total) by mouth daily. For hot flashes Patient not taking: Reported on 02/22/2015 07/22/14   Marrian Salvage, MD   BP 112/62 mmHg  Pulse 103  Temp(Src) 98.8 F (37.1 C) (Oral)  Resp 24  Ht 5\' 4"  (1.626 m)  Wt 135 lb (61.236 kg)  BMI 23.16 kg/m2  SpO2 99%  LMP 11/02/2010 Physical Exam  Constitutional: She appears well-developed and well-nourished. No distress.  Awake, alert, nontoxic appearance  HENT:  Head: Normocephalic and atraumatic.  Mouth/Throat: Oropharynx is clear and moist. No oropharyngeal exudate.  Eyes: Conjunctivae are normal. No scleral icterus.  Neck: Normal range of motion. Neck supple.  Cardiovascular: Normal rate, regular rhythm, normal heart sounds and intact distal pulses.   Pulmonary/Chest: Effort normal and breath sounds normal. No respiratory distress. She has no wheezes. She exhibits no tenderness.  Equal chest expansion  Abdominal: Soft. Bowel sounds are normal. She exhibits no distension and no mass. There is no tenderness. There is no rebound and no guarding.  Musculoskeletal: Normal range of motion. She exhibits no edema.  Neurological: She is alert.  Speech is clear and goal oriented Moves extremities without ataxia  Skin: Skin is warm and  dry. She is not diaphoretic.  Psychiatric: She has a normal mood and affect.  Nursing note and vitals reviewed.   ED Course  Procedures (including critical care time) Labs Review Labs Reviewed  CBC - Abnormal; Notable for the following:    RDW 16.0 (*)    All other components within normal limits  BASIC METABOLIC PANEL - Abnormal; Notable for the following:    CO2 15 (*)    Glucose, Bld 58 (*)    GFR calc non Af Amer 72 (*)    GFR calc Af Amer 84 (*)    Anion gap 18 (*)    All other components within normal limits  ETHANOL - Abnormal; Notable for the following:    Alcohol, Ethyl (B) 54 (*)    All other components within normal limits  BRAIN NATRIURETIC PEPTIDE  URINE RAPID DRUG SCREEN (HOSP PERFORMED)  I-STAT TROPOININ, ED  CBG MONITORING, ED  Rosezena Sensor, ED    Imaging Review Dg Chest 2 View  02/22/2015   CLINICAL DATA:  Acute onset of chest pain and nausea. Initial encounter.  EXAM: CHEST  2 VIEW  COMPARISON:  Chest radiograph performed 06/02/2013  FINDINGS: The lungs are well-aerated and clear. There is no evidence of focal opacification, pleural effusion or pneumothorax.  The heart is borderline normal in size. No acute osseous abnormalities are seen.  IMPRESSION: No acute cardiopulmonary process seen.   Electronically Signed   By: Roanna Raider M.D.   On: 02/22/2015 23:23     EKG Interpretation   Date/Time:  Monday February 22 2015 22:48:36 EDT Ventricular Rate:  95 PR Interval:  180 QRS Duration: 125 QT Interval:  429 QTC Calculation: 539 R Axis:   77 Text Interpretation:  Sinus rhythm Left ventricular hypertrophy ST  elevation, consider anterior injury Prolonged QT interval No significant  change since last tracing Confirmed by Ethelda Chick  MD, SAM [phone]) on  02/22/2015 10:55:08 PM      Cardiac Cath 06/23/14: Coronary angiography: Coronary dominance: right  Left mainstem: Long, no significant disease.   Left anterior descending (LAD): No angiographic  coronary disease.   Left circumflex (LCx): No angiographic coronary disease.  Right coronary artery (RCA): No angiographic coronary disease.  Left ventriculography: Left ventricular systolic function is low normal to mildly reduced, LVEF is estimated at 50-55%, there is no significant mitral regurgitation     MDM   Final diagnoses:  Chest pain, unspecified chest pain type  SOB (shortness of breath)   [name] presents with history of MI, cocaine abuse, hypertension and chest tightness. Will begin cardiac workup. Concern for possible ACS.  Pt with negative coronary cath in Aug 2015 but suffered a cardiac arrest of unknown etiology in March 2014.     1:36 AM Repeat troponin pending.  Labs reassuring at this point.  Care transferred to Dr. Cathren Laine who will monitor delta troponin and dispo accordingly.    BP 112/62 mmHg  Pulse 103  Temp(Src) 98.8 F (37.1 C) (Oral)  Resp 24  Ht  (1.626 m)  Wt 135 lb (61.236 kg)  BMI 23.16 kg/m2  SpO2 99%  LMP 11/02/2010   Dierdre Forth, PA-C 02/23/15 [phone]

## 2015-02-22 NOTE — ED Notes (Signed)
PER EMS: pt from home, reports SOB and central CP "tightness" that started around 2130 while she was laying down watching tv. Hx of MI a year ago and she states the CP does not feel the same as when she had the MI. Pt did not take nitro and EMS did not give due to BP: 104/70. HR-74, RR-18, 91% RA. A&OX4.

## 2015-02-22 NOTE — ED Notes (Addendum)
Pt states EMS administered 4 baby aspirins en route. Pt also reports she attempted to smoke a cigarette tonight prior to her CP and it was the first time she has smoked in months. After smoking the chest tightness started. Dahlia Client, PA at bedside.

## 2015-02-23 LAB — ETHANOL: Alcohol, Ethyl (B): 54 mg/dL — ABNORMAL HIGH (ref 0–9)

## 2015-02-23 LAB — BASIC METABOLIC PANEL
ANION GAP: 12 (ref 5–15)
BUN: 12 mg/dL (ref 6–23)
CO2: 17 mmol/L — AB (ref 19–32)
Calcium: 7.7 mg/dL — ABNORMAL LOW (ref 8.4–10.5)
Chloride: 105 mmol/L (ref 96–112)
Creatinine, Ser: 0.57 mg/dL (ref 0.50–1.10)
GFR calc Af Amer: >90 mL/min (ref >90)
GFR calc non Af Amer: >90 mL/min (ref >90)
GLUCOSE: 103 mg/dL — AB (ref 70–99)
Potassium: 3.5 mmol/L (ref 3.5–5.1)
Sodium: 134 mmol/L — ABNORMAL LOW (ref 135–145)

## 2015-02-23 LAB — RAPID URINE DRUG SCREEN, HOSP PERFORMED
Amphetamines: NOT DETECTED
BARBITURATES: NOT DETECTED
Benzodiazepines: NOT DETECTED
Cocaine: POSITIVE — AB
OPIATES: NOT DETECTED
Tetrahydrocannabinol: NOT DETECTED

## 2015-02-23 LAB — I-STAT TROPONIN, ED: TROPONIN I, POC: 0.01 ng/mL (ref 0.00–0.08)

## 2015-02-23 LAB — BRAIN NATRIURETIC PEPTIDE: B Natriuretic Peptide: 59 pg/mL (ref 0.0–100.0)

## 2015-02-23 LAB — CBG MONITORING, ED
Glucose-Capillary: 83 mg/dL (ref 70–99)
Glucose-Capillary: 89 mg/dL (ref 70–99)

## 2015-02-23 MED ORDER — ACETAMINOPHEN 325 MG PO TABS
650.0000 mg | ORAL_TABLET | Freq: Once | ORAL | Status: AC
  Administered 2015-02-23: 650 mg via ORAL
  Filled 2015-02-23: qty 2

## 2015-02-23 MED ORDER — SODIUM CHLORIDE 0.9 % IV BOLUS (SEPSIS)
500.0000 mL | Freq: Once | INTRAVENOUS | Status: AC
  Administered 2015-02-23: 500 mL via INTRAVENOUS

## 2015-02-23 MED ORDER — SODIUM CHLORIDE 0.9 % IV BOLUS (SEPSIS)
1000.0000 mL | Freq: Once | INTRAVENOUS | Status: AC
  Administered 2015-02-23: 1000 mL via INTRAVENOUS

## 2015-02-23 NOTE — ED Provider Notes (Signed)
Medical screening examination/treatment/procedure(s) were conducted as a shared visit with non-physician practitioner(s) and myself.  I personally evaluated the patient during the encounter.   EKG Interpretation   Date/Time:  Monday February 22 2015 23:53:26 EDT Ventricular Rate:  98 PR Interval:  175 QRS Duration: 127 QT Interval:  420 QTC Calculation: 536 R Axis:   70 Text Interpretation:  Sinus rhythm Left bundle branch block No significant  change since last tracing Confirmed by Kadiatou Oplinger  MD, Caryn Bee ([id number]) on  02/23/2015 2:03:48 AM      Pt with atpical, midline, cp at rest in past day. Not pleuritic. No sob. Recheck hr 92, rr 16, pulse ox 100% room air. No increased wob. Pt resting comfortably. 2 prior cardiac caths neg for cad. Initial trop and delta trop neg.   Pt resting comfortably.  Pt currently appears stable for d/c.     Cathren Laine, MD 02/23/15 [phone]

## 2015-02-23 NOTE — ED Notes (Signed)
Pt given orange juice, crackers and peanut butter. 

## 2015-02-23 NOTE — Discharge Instructions (Signed)
It was our pleasure to provide your ER care today - we hope that you feel better.  Continue pepcid. You may also try maalox or mylanta as need for symptom relief if heartburn.   Avoid alcohol use and/or cocaine use - see resource guide provided for community alcohol and substance abuse resources.   Follow up with primary care doctor in the next couple days for recheck - call office this morning to arrange follow up appointment.  Return to ER right away if worse, new symptoms, fevers, recurrent or persistent chest pain, trouble breathing, other concern.     Chest Pain (Nonspecific) It is often hard to give a specific diagnosis for the cause of chest pain. There is always a chance that your pain could be related to something serious, such as a heart attack or a blood clot in the lungs. You need to follow up with your health care provider for further evaluation. CAUSES   Heartburn.  Pneumonia or bronchitis.  Anxiety or stress.  Inflammation around your heart (pericarditis) or lung (pleuritis or pleurisy).  A blood clot in the lung.  A collapsed lung (pneumothorax). It can develop suddenly on its own (spontaneous pneumothorax) or from trauma to the chest.  Shingles infection (herpes zoster virus). The chest wall is composed of bones, muscles, and cartilage. Any of these can be the source of the pain.  The bones can be bruised by injury.  The muscles or cartilage can be strained by coughing or overwork.  The cartilage can be affected by inflammation and become sore (costochondritis). DIAGNOSIS  Lab tests or other studies may be needed to find the cause of your pain. Your health care provider may have you take a test called an ambulatory electrocardiogram (ECG). An ECG records your heartbeat patterns over a 24-hour period. You may also have other tests, such as:  Transthoracic echocardiogram (TTE). During echocardiography, sound waves are used to evaluate how blood flows through  your heart.  Transesophageal echocardiogram (TEE).  Cardiac monitoring. This allows your health care provider to monitor your heart rate and rhythm in real time.  Holter monitor. This is a portable device that records your heartbeat and can help diagnose heart arrhythmias. It allows your health care provider to track your heart activity for several days, if needed.  Stress tests by exercise or by giving medicine that makes the heart beat faster. TREATMENT   Treatment depends on what may be causing your chest pain. Treatment may include:  Acid blockers for heartburn.  Anti-inflammatory medicine.  Pain medicine for inflammatory conditions.  Antibiotics if an infection is present.  You may be advised to change lifestyle habits. This includes stopping smoking and avoiding alcohol, caffeine, and chocolate.  You may be advised to keep your head raised (elevated) when sleeping. This reduces the chance of acid going backward from your stomach into your esophagus. Most of the time, nonspecific chest pain will improve within 2-3 days with rest and mild pain medicine.  HOME CARE INSTRUCTIONS   If antibiotics were prescribed, take them as directed. Finish them even if you start to feel better.  For the next few days, avoid physical activities that bring on chest pain. Continue physical activities as directed.  Do not use any tobacco products, including cigarettes, chewing tobacco, or electronic cigarettes.  Avoid drinking alcohol.  Only take medicine as directed by your health care provider.  Follow your health care provider's suggestions for further testing if your chest pain does not go away.  Keep any follow-up appointments you made. If you do not go to an appointment, you could develop lasting (chronic) problems with pain. If there is any problem keeping an appointment, call to reschedule. SEEK MEDICAL CARE IF:   Your chest pain does not go away, even after treatment.  You have a  rash with blisters on your chest.  You have a fever. SEEK IMMEDIATE MEDICAL CARE IF:   You have increased chest pain or pain that spreads to your arm, neck, jaw, back, or abdomen.  You have shortness of breath.  You have an increasing cough, or you cough up blood.  You have severe back or abdominal pain.  You feel nauseous or vomit.  You have severe weakness.  You faint.  You have chills. This is an emergency. Do not wait to see if the pain will go away. Get medical help at once. Call your local emergency services (911 in U.S.). Do not drive yourself to the hospital. MAKE SURE YOU:   Understand these instructions.  Will watch your condition.  Will get help right away if you are not doing well or get worse. Document Released: 07/26/2005 Document Revised: 10/21/2013 Document Reviewed: 05/21/2008 [facility] Patient Information 2015 [place], [place]. This information is not intended to replace advice given to you by your health care provider. Make sure you discuss any questions you have with your health care provider.    Esophagitis Esophagitis is inflammation of the esophagus. It can involve swelling, soreness, and pain in the esophagus. This condition can make it difficult and painful to swallow. CAUSES  Most causes of esophagitis are not serious. Many different factors can cause esophagitis, including:  Gastroesophageal reflux disease (GERD). This is when acid from your stomach flows up into the esophagus.  Recurrent vomiting.  An allergic-type reaction.  Certain medicines, especially those that come in large pills.  Ingestion of harmful chemicals, such as household cleaning products.  Heavy alcohol use.  An infection of the esophagus.  Radiation treatment for cancer.  Certain diseases such as sarcoidosis, Crohn's disease, and scleroderma. These diseases may cause recurrent esophagitis. SYMPTOMS   Trouble swallowing.  Painful swallowing.  Chest  pain.  Difficulty breathing.  Nausea.  Vomiting.  Abdominal pain. DIAGNOSIS  Your caregiver will take your history and do a physical exam. Depending upon what your caregiver finds, certain tests may also be done, including:  Barium X-ray. You will drink a solution that coats the esophagus, and X-rays will be taken.  Endoscopy. A lighted tube is put down the esophagus so your caregiver can examine the area.  Allergy tests. These can sometimes be arranged through follow-up visits. TREATMENT  Treatment will depend on the cause of your esophagitis. In some cases, steroids or other medicines may be given to help relieve your symptoms or to treat the underlying cause of your condition. Medicines that may be recommended include:  Viscous lidocaine, to soothe the esophagus.  Antacids.  Acid reducers.  Proton pump inhibitors.  Antiviral medicines for certain viral infections of the esophagus.  Antifungal medicines for certain fungal infections of the esophagus.  Antibiotic medicines, depending on the cause of the esophagitis. HOME CARE INSTRUCTIONS   Avoid foods and drinks that seem to make your symptoms worse.  Eat small, frequent meals instead of large meals.  Avoid eating for the 3 hours prior to your bedtime.  If you have trouble taking pills, use a pill splitter to decrease the size and likelihood of the pill getting stuck or injuring  the esophagus on the way down. Drinking water after taking a pill also helps.  Stop smoking if you smoke.  Maintain a healthy weight.  Wear loose-fitting clothing. Do not wear anything tight around your waist that causes pressure on your stomach.  Raise the head of your bed 6 to 8 inches with wood blocks to help you sleep. Extra pillows will not help.  Only take over-the-counter or prescription medicines as directed by your caregiver. SEEK IMMEDIATE MEDICAL CARE IF:  You have severe chest pain that radiates into your arm, neck, or  jaw.  You feel sweaty, dizzy, or lightheaded.  You have shortness of breath.  You vomit blood.  You have difficulty or pain with swallowing.  You have bloody or black, tarry stools.  You have a fever.  You have a burning sensation in the chest more than 3 times a week for more than 2 weeks.  You cannot swallow, drink, or eat.  You drool because you cannot swallow your saliva. MAKE SURE YOU:  Understand these instructions.  Will watch your condition.  Will get help right away if you are not doing well or get worse. Document Released: 11/23/2004 Document Revised: 01/08/2012 Document Reviewed: 06/16/2011 [facility] Patient Information 2015 [place], [place]. This information is not intended to replace advice given to you by your health care provider. Make sure you discuss any questions you have with your health care provider.    Stimulant Use Disorder-Cocaine Cocaine is one of a group of powerful drugs called stimulants. Cocaine has medical uses for stopping nosebleeds and for pain control before minor nose or dental surgery. However, cocaine is misused because of the effects that it produces. These effects include:   A feeling of extreme pleasure.  Alertness.  High energy. Common street names for cocaine include coke, crack, blow, snow, and nose candy. Cocaine is snorted, dissolved in water and injected, or smoked.  Stimulants are addictive because they activate regions of the brain that produce both the pleasurable sensation of "reward" and psychological dependence. Together, these actions account for loss of control and the rapid development of drug dependence. This means you become ill without the drug (withdrawal) and need to keep using it to function.  Stimulant use disorder is use of stimulants that disrupts your daily life. It disrupts relationships with family and friends and how you do your job. Cocaine increases your blood pressure and heart rate. It can cause a heart  attack or stroke. Cocaine can also cause death from irregular heart rate or seizures. SYMPTOMS Symptoms of stimulant use disorder with cocaine include:  Use of cocaine in larger amounts or over a longer period of time than intended.  Unsuccessful attempts to cut down or control cocaine use.  A lot of time spent obtaining, using, or recovering from the effects of cocaine.  A strong desire or urge to use cocaine (craving).  Continued use of cocaine in spite of major problems at work, school, or home because of use.  Continued use of cocaine in spite of relationship problems because of use.  Giving up or cutting down on important life activities because of cocaine use.  Use of cocaine over and over in situations when it is physically hazardous, such as driving a car.  Continued use of cocaine in spite of a physical problem that is likely related to use. Physical problems can include:  Malnutrition.  Nosebleeds.  Chest pain.  High blood pressure.  A hole that develops between the part of your  nose that separates your nostrils (perforated nasal septum).  Lung and kidney damage.  Continued use of cocaine in spite of a mental problem that is likely related to use. Mental problems can include:  Schizophrenia-like symptoms.  Depression.  Bipolar mood swings.  Anxiety.  Sleep problems.  Need to use more and more cocaine to get the same effect, or lessened effect over time with use of the same amount of cocaine (tolerance).  Having withdrawal symptoms when cocaine use is stopped, or using cocaine to reduce or avoid withdrawal symptoms. Withdrawal symptoms include:  Depressed or irritable mood.  Low energy or restlessness.  Bad dreams.  Poor or excessive sleep.  Increased appetite. DIAGNOSIS Stimulant use disorder is diagnosed by your health care provider. You may be asked questions about your cocaine use and how it affects your life. A physical exam may be done. A  drug screen may be ordered. You may be referred to a mental health professional. The diagnosis of stimulant use disorder requires at least two symptoms within 12 months. The type of stimulant use disorder depends on the number of signs and symptoms you have. The type may be:  Mild. Two or three signs and symptoms.  Moderate. Four or five signs and symptoms.  Severe. Six or more signs and symptoms. TREATMENT Treatment for stimulant use disorder is usually provided by mental health professionals with training in substance use disorders. The following options are available:  Counseling or talk therapy. Talk therapy addresses the reasons you use cocaine and ways to keep you from using again. Goals of talk therapy include:  Identifying and avoiding triggers for use.  Handling cravings.  Replacing use with healthy activities.  Support groups. Support groups provide emotional support, advice, and guidance.  Medicine. Certain medicines may decrease cocaine cravings or withdrawal symptoms. HOME CARE INSTRUCTIONS  Take medicines only as directed by your health care provider.  Identify the people and activities that trigger your cocaine use and avoid them.  Keep all follow-up visits as directed by your health care provider. SEEK MEDICAL CARE IF:  Your symptoms get worse or you relapse.  You are not able to take medicines as directed. SEEK IMMEDIATE MEDICAL CARE IF:  You have serious thoughts about hurting yourself or others.  You have a seizure, chest pain, sudden weakness, or loss of speech or vision. FOR MORE INFORMATION  National Institute on Drug Abuse: http://www.price-smith.com/  Substance Abuse and Mental Health Services Administration: SkateOasis.com.pt Document Released: 10/13/2000 Document Revised: 03/02/2014 Document Reviewed: 10/29/2013 [facility] Patient Information 2015 [place], [place]. This information is not intended to replace advice given to you by your health care provider. Make  sure you discuss any questions you have with your health care provider.      Emergency Department Resource Guide 1) Find a Doctor and Pay Out of Pocket Although you won't have to find out who is covered by your insurance plan, it is a good idea to ask around and get recommendations. You will then need to call the office and see if the doctor you have chosen will accept you as a new patient and what types of options they offer for patients who are self-pay. Some doctors offer discounts or will set up payment plans for their patients who do not have insurance, but you will need to ask so you aren't surprised when you get to your appointment.  2) Contact Your Local Health Department Not all health departments have doctors that can see patients for sick visits, but  many do, so it is worth a call to see if yours does. If you don't know where your local health department is, you can check in your phone book. The CDC also has a tool to help you locate your state's health department, and many state websites also have listings of all of their local health departments.  3) Find a Walk-in Clinic If your illness is not likely to be very severe or complicated, you may want to try a walk in clinic. These are popping up all over the country in pharmacies, drugstores, and shopping centers. They're usually staffed by nurse practitioners or physician assistants that have been trained to treat common illnesses and complaints. They're usually fairly quick and inexpensive. However, if you have serious medical issues or chronic medical problems, these are probably not your best option.  No Primary Care Doctor: - Call Health Connect at  [phone] - they can help you locate a primary care doctor that  accepts your insurance, provides certain services, etc. - Physician Referral Service- [phone]  Chronic Pain Problems: Organization         Address  Phone   Notes  [facility]  [phone]  Patients need to be referred by their primary care doctor.   Medication Assistance: Organization         Address  Phone   Notes  [facility] Medication [facility] [address] [place] [place], [place] [address] [phone] --Must be a resident of [facility] -- Must have NO insurance coverage whatsoever (no Medicaid/ Medicare, etc.) -- The pt. MUST have a primary care doctor that directs their care regularly and follows them in the community   [facility]  [phone]   [employer]  [phone]    Agencies that provide inexpensive medical care: Organization         Address  Phone   Notes  [facility]  [phone]   [facility]    [phone]   [facility] [address] [place], [place] [address] [phone]   [facility] [address] [place] [place] [phone]   Planned Parenthood    [phone]   [facility]    [phone]   [facility]  [address], [place] Phone:  [phone], Fax:  [phone] Hours of Operation:  9 am - 6 pm, M-F.  Also accepts Medicaid/Medicare and self-pay.  [facility]  [address], [place] Phone: [phone], Fax: [phone]. Hours of Operation:  8:30 am - 5:30 pm, M-F.  Also accepts Medicaid and self-pay.  [facility] [place] [address] [place] Phone: [phone]   [facility] [address] Natasha Bence [place], [place] [phone], Ext. [phone] Mondays & Thursdays: 7-9 AM.  First 15 patients are seen on a first come, first serve basis.    Medicaid-accepting [facility] Providers:  Organization         Address  Phone   Notes  [facility] [address], [place] [phone] Also accepts self-pay patients.  [facility] [address] Laurell Josephs [place], [place]  [phone]   [facility] [address], [place] [phone]   [facility] [address] [place] [phone]   Adrian Saran  [place] [address], [place]   [phone] Only accepts [place] patients after they have their name applied to their card.   Self-Pay (no insurance) in [facility]:  Organization         Address  Phone   Notes  Sickle Cell Patients, [facility] Internal Medicine [address] [place], [place] [phone]   [facility] [address] [place], [place] [phone]   [facility] [place]  [address], [place] [phone]   [facility]/Dr. Osei-Bonsu  [address] [place] or [address], [place] [phone] Phone number for both [place] and [place] locations is the same.  [facility] [address], [place] [phone]   [facility] [address] [place] or [address] [phone]   [facility] [address] [place] [phone], phone; [phone], fax Sees patients 1st and 3rd Saturday of every month.  Must not qualify for public or private insurance (i.e. Medicaid, Medicare, [place] Health Choice, Veterans' Benefits)  Household income should be no more than 200% of the poverty level The clinic cannot treat you if you are pregnant or think you are pregnant  Sexually transmitted diseases are not treated at the clinic.    Dental Care: Organization         Address  Phone  Notes  [facility] Department of [facility] [address] [place], [place] [phone] Accepts children up to age 80 who are enrolled in [place] or [place] Health Choice; pregnant women with a Medicaid card; and children who have applied for Medicaid or [place] Health Choice, but were  declined, whose parents can pay a reduced fee at time of service.  [facility] Department of [facility]  [address], [place] [phone] Accepts children up to age 22 who are enrolled in [place] or [place] Health Choice; pregnant women with a Medicaid card; and children who have applied for Medicaid or [place] Health Choice, but were declined, whose parents can pay a reduced fee at time of service.  [facility]  [address] [place], [place] [phone] Patients are seen by appointment only. Walk-ins are not accepted. [facility] will see patients 94 years of age and older. Monday - Tuesday (8am-5pm) Most Wednesdays (8:30-5pm) $30 per visit, cash only  [facility] Adult [facility]  [address], [employer] [phone] Patients are seen by appointment only. Walk-ins are not accepted. [facility] will see patients 85 years of age and older. One Wednesday Evening (Monthly: Volunteer Based).  $30 per visit, cash only  [employer]  [phone] for adults; Children under age 5, call Graduate Pediatric Dentistry at [phone]. Children aged 66-14, please call [phone] to request a pediatric application.  Dental services are provided in all areas of dental care including fillings, crowns and bridges, complete and partial dentures, implants, gum treatment, root canals, and extractions. Preventive care is also provided. Treatment is provided to both adults and children. Patients are selected via a lottery and there is often a waiting list.   [facility] [address] [place]  [phone] [email or web address]   [facility] [address] [place], [place] [phone], Ext. [phone] Second  and Fourth Thursday of each month, opens at 6:30 AM; Clinic ends at 9 AM.  Patients are seen on a first-come first-served basis, and a limited number are seen during each clinic.    [facility]  [address] [place], [place] [phone]   Eligibility Requirements You must have lived in [place], [place], or [place] counties for at least the last three months.   You cannot be eligible for state or federal sponsored [place], including [employer], [place], or [employer].   You generally cannot be eligible for healthcare insurance through your employer.    How to apply: Eligibility screenings are held every Tuesday and Wednesday afternoon from 1:00 pm until 4:00 pm. You do not need an appointment for the interview!  [facility] [address] [place], [place] [phone]   [facility] Health Department  [phone]   [facility]  [phone]   [facility] Health Department  [phone]    Behavioral Health Resources in the Community: Intensive Outpatient Programs Organization         Address  Phone  Notes  [facility] Services [address], [place], [place] [phone]   [facility] Outpatient [address], [place], [place] [phone]   [facility] [address], [place], [place]  [phone]   [facility] [address],  [place], [place] [phone] or [phone]   Substance Abuse Resources Organization         Address  Phone  Notes  [facility]  [phone]   [facility]  [phone]   [facility] [place]  [phone]   Floydene Flock  [phone]   Residential & Outpatient Substance Abuse Program  [phone]   Psychological Services Organization         Address  Phone  Notes  [facility] Behavioral Health  [phone]   [facility] Services  [phone]   [facility] Mental Health [address] [place] [phone] or [phone]    Mobile Crisis Teams Organization         Address  Phone  Notes  [facility], Mobile Crisis Care  Unit  [phone]   [facility]  [address] [place], [place] [phone]   Doristine Locks [address] [place] [phone]    Self-Help/Support Groups Organization         Address  Phone             Notes  [facility] [place] - variety of support groups  [phone]- [phone] Call for more information  Narcotics Anonymous (NA), [facility] [address], [employer] [place]  2 meetings at this location   Statistician         Address  Phone  Notes  [facility] [address],    [place]  [phone]   [facility]  [address] [place] [address], [place], [place] [phone]   [facility] [address] [place], [place] [phone] Admissions: 8am-3pm M-F  [facility] [address]    [place], [place] [phone]   [facility] [address]s [place], [place] [phone]   [facility] [address]  [place], [place] [phone]   [facility] [address], [place], [place] [phone]   [facility] [phone]  [employer]  [place], [place] [phone] or [phone]   [facility] [address], [place], [place] [phone] Accepts Medicaid  [facility] [address]  [place] [phone] Substance Abuse/Addiction Treatment   [facility]         Address  Phone  Notes  [facility]  [phone]   Angie Fava, PhD [address] Ervin Knack [place], [place]   [phone] or [phone]   [facility]   [address] [place], [place] [phone]   [facility] [address] [place], [place] [phone] Insurance/Medicaid/sponsorship through [facility] Families [address]                                     [place], [place] [phone] Therapy/tele-psych/case  [facility] [address], [place] [phone]    Dr. Lolly Mustache  [phone]   [facility]  [employer] [facility] Dept. 1) [address] [place] 2) [address], [place] 3)  [address], [place] [phone]  [phone]   [facility] Child Abuse Hotline [phone] or [phone] (After Hours)

## 2015-03-01 ENCOUNTER — Ambulatory Visit (INDEPENDENT_AMBULATORY_CARE_PROVIDER_SITE_OTHER): Payer: Self-pay | Admitting: Pulmonary Disease

## 2015-03-01 ENCOUNTER — Encounter: Payer: Self-pay | Admitting: Pulmonary Disease

## 2015-03-01 VITALS — BP 129/77 | HR 74 | Temp 98.3°F | Wt 127.8 lb

## 2015-03-01 DIAGNOSIS — Z87898 Personal history of other specified conditions: Secondary | ICD-10-CM

## 2015-03-01 DIAGNOSIS — D518 Other vitamin B12 deficiency anemias: Secondary | ICD-10-CM

## 2015-03-01 DIAGNOSIS — F1910 Other psychoactive substance abuse, uncomplicated: Secondary | ICD-10-CM

## 2015-03-01 DIAGNOSIS — F1410 Cocaine abuse, uncomplicated: Secondary | ICD-10-CM

## 2015-03-01 DIAGNOSIS — I10 Essential (primary) hypertension: Secondary | ICD-10-CM

## 2015-03-01 NOTE — Assessment & Plan Note (Signed)
Assessment: Patient on PO vitamin B12. She brought her bottle today -- has been getting it over the counter.  Plan: -Updated medication list

## 2015-03-01 NOTE — Assessment & Plan Note (Addendum)
Assessment: She was seen in the ED 02/23/2015 for chest pain. EKG was unchanged from previous. Troponins neg x2. CXR negative for acute cardiopulm process. +cocaine on UDS. No further episodes. May have been 2/2 cocaine use. Reports she had 1 beer yesterday and does not drink every day. She is due for follow up with her cardiologist Dr. Shirlee Latch.  Plan: -Patient plans on making follow up appointment with Dr. Shirlee Latch -Encouraged cocaine cessation

## 2015-03-01 NOTE — Progress Notes (Signed)
INTERNAL MEDICINE TEACHING ATTENDING ADDENDUM - Peony Barner, MD: I reviewed and discussed at the time of visit with the resident Dr. Krall, the patient's medical history, physical examination, diagnosis and results of pertinent tests and treatment and I agree with the patient's care as documented.  

## 2015-03-01 NOTE — Patient Instructions (Signed)
Please make a follow up appointment with your cardiologist, Dr. Shirlee Latch.  General Instructions:   Thank you for bringing your medicines today. This helps [place] keep you safe from mistakes.   Progress Toward Treatment Goals:  Treatment Goal 03/01/2015  Blood pressure at goal  Stop smoking -    Self Care Goals & Plans:  Self Care Goal 03/01/2015  Manage my medications take my medicines as prescribed  Monitor my health -  Eat healthy foods drink diet soda or water instead of juice or soda; eat foods that are low in salt; eat fruit for snacks and desserts  Be physically active find an activity I enjoy  Stop smoking -    Care Management & Community Referrals:  Referral 03/01/2015  Referrals made for care management support none needed

## 2015-03-01 NOTE — Assessment & Plan Note (Signed)
BP Readings from Last 3 Encounters:  03/01/15 129/77  02/23/15 112/68  08/24/14 134/71    Lab Results  Component Value Date   NA 134* 02/23/2015   K 3.5 02/23/2015   CREATININE 0.57 02/23/2015    Assessment: Blood pressure control: controlled Progress toward BP goal:  at goal  Plan: Medications:  continue current medications including coreg 12.5mg  BID, lasix 20mg  daily prn, lisinopril 5mg  daily

## 2015-03-01 NOTE — Assessment & Plan Note (Signed)
Assessment: UDS 02/23/2015 +cocaine  Plan: -Encouraged patient to stop using cocaine. -Patient declined assistance in quitting at this time.

## 2015-03-01 NOTE — Progress Notes (Signed)
Subjective:   [name], female    DOB: Aug 01, 1961, 54 y.o.   MRN: [id number]  HPI [name] is a 54 year old woman with history of CVA, HTN, DVT/PE, depression/anxiety, cocaine abuse presenting for follow up.  She was seen in the ED 02/23/2015 for chest pain. EKG was unchanged from previous. Troponins neg x2. CXR negative for acute cardiopulm process. +cocaine on UDS. She was stable for discharge. Today she reports that she has not had any recurrence of that chest pain. She has chronic chest tightness and SOB with exertion. No dyspnea at rest. No orthopnea but does have some paroxysmal nocturnal dyspnea. She reports she has not been taking her lasix as she has not felt she has needed it. She states she will quit cocaine and denied need for assistance in this.  Review of Systems  Constitutional: no fevers/chills Eyes: no vision changes Ears, nose, mouth, throat, and face: +chronic dry cough Respiratory: +exertional shortness of breath Cardiovascular: +exertional chest pain Gastrointestinal: no nausea/vomiting, no abdominal pain, no constipation, no diarrhea Genitourinary: no dysuria, no hematuria Integument: no rash Hematologic/lymphatic: no bleeding/bruising, no edema Musculoskeletal: no arthralgias, no myalgias Neurological: no paresthesias, no weakness   Past Medical History  Diagnosis Date  . Rectal bleeding 2007    internal hemmorhoids by colonoscopy in 04/2006, Bx neg for IBD  . Duodenitis     secondary to H pylori(treatment completed 05/2006), +/- NSAIDS  . Alcoholism   . History of cocaine abuse   . PE 05/16/2009    CT angio positive for PE in 07/10, treated with coumadin for 8 months.   Marland Kitchen ETOH abuse   . Anxiety   . Depression   . Pulmonary embolism     a. 04/2009 - treated with coumadin x 8 mos.  . DVT (deep venous thrombosis)     a. 04/2009 - treated with coumadin x 8 mos.  . Rectal bleeding     a. int hemorrhoids by colonoscopy in 04/2006,  Bx neg for IBD  . Duodenitis     a. 05/2006 2/2 H pylori +/- NSAIDS  . History of CVA (cerebrovascular accident)   . Tobacco abuse   . Chest pain     a. 04/2009 Echo: EF 55-60%, Gr1 DD, Mild MR.  Marland Kitchen Hypertension   . Heart murmur   . Stroke   . Seizures   . ANEMIA, VITAMIN B12 DEFICIENCY 11/11/2007    Documented low 175 on 09/2007; received 6 yrs of IM therapy; switched to po 05/2013    Current Outpatient Prescriptions on File Prior to Visit  Medication Sig Dispense Refill  . atorvastatin (LIPITOR) 40 MG tablet Take 1 tablet (40 mg total) by mouth daily. 30 tablet 5  . carvedilol (COREG) 12.5 MG tablet Take 1 tablet (12.5 mg total) by mouth 2 (two) times daily. 60 tablet 1  . Cholecalciferol 1000 UNITS capsule Take 1 capsule (1,000 Units total) by mouth daily. 30 capsule 12  . famotidine (PEPCID) 20 MG tablet Take 1 tablet (20 mg total) by mouth 2 (two) times daily. 60 tablet 6  . folic acid (FOLVITE) 1 MG tablet Take 1 tablet (1 mg total) by mouth daily. (Patient not taking: Reported on 02/22/2015) 30 tablet 3  . furosemide (LASIX) 20 MG tablet Take 1 tablet (20 mg total) by mouth daily. As needed (Patient not taking: Reported on 02/22/2015) 30 tablet 2  . lisinopril (PRINIVIL,ZESTRIL) 5 MG tablet Take 1 tablet (5 mg total) by mouth  daily. 30 tablet 3  . loratadine (CLARITIN) 10 MG tablet Take 10 mg by mouth daily as needed for allergies.     . nitroGLYCERIN (NITROSTAT) 0.4 MG SL tablet Place 1 tablet (0.4 mg total) under the tongue every 5 (five) minutes as needed for chest pain. 25 tablet 5  . potassium chloride (K-DUR) 10 MEQ tablet Take 1 tablet (10 mEq total) by mouth daily. 30 tablet 0  . thiamine 100 MG tablet Take 1 tablet (100 mg total) by mouth daily. 30 tablet 12  . venlafaxine XR (EFFEXOR-XR) 75 MG 24 hr capsule Take 1 capsule (75 mg total) by mouth daily. For hot flashes (Patient not taking: Reported on 02/22/2015) 30 capsule 3   No current facility-administered medications on file  prior to visit.   Today's Vitals   03/01/15 0852  BP: 129/77  Pulse: 74  Temp: 98.3 F (36.8 C)  TempSrc: Oral  Weight: 127 lb 12.8 oz (57.97 kg)  SpO2: 100%   Objective:  Physical Exam  Constitutional: She is oriented to person, place, and time. She appears well-developed and well-nourished.  HENT:  Head: Normocephalic and atraumatic.  Eyes: Conjunctivae and EOM are normal.  Neck: Neck supple.  Cardiovascular: Normal rate, regular rhythm and normal heart sounds.   Pulmonary/Chest: Effort normal and breath sounds normal. She has no wheezes. She has no rales.  Abdominal: Soft. Bowel sounds are normal. She exhibits no distension. There is no tenderness.  Musculoskeletal: She exhibits no edema.  Neurological: She is alert and oriented to person, place, and time.  Skin: Skin is warm and dry.  Psychiatric: She has a normal mood and affect.   Assessment & Plan:  Please refer to problem based charting.

## 2015-06-07 ENCOUNTER — Other Ambulatory Visit: Payer: Self-pay | Admitting: Internal Medicine

## 2015-06-21 ENCOUNTER — Ambulatory Visit (INDEPENDENT_AMBULATORY_CARE_PROVIDER_SITE_OTHER): Payer: Self-pay | Admitting: Internal Medicine

## 2015-06-21 ENCOUNTER — Encounter: Payer: Self-pay | Admitting: Internal Medicine

## 2015-06-21 VITALS — BP 138/72 | HR 80 | Temp 98.0°F | Ht 64.0 in | Wt 120.7 lb

## 2015-06-21 DIAGNOSIS — R946 Abnormal results of thyroid function studies: Secondary | ICD-10-CM

## 2015-06-21 DIAGNOSIS — F1020 Alcohol dependence, uncomplicated: Secondary | ICD-10-CM

## 2015-06-21 DIAGNOSIS — F1910 Other psychoactive substance abuse, uncomplicated: Secondary | ICD-10-CM

## 2015-06-21 DIAGNOSIS — F17210 Nicotine dependence, cigarettes, uncomplicated: Secondary | ICD-10-CM

## 2015-06-21 DIAGNOSIS — R7989 Other specified abnormal findings of blood chemistry: Secondary | ICD-10-CM

## 2015-06-21 DIAGNOSIS — Z87898 Personal history of other specified conditions: Secondary | ICD-10-CM

## 2015-06-21 DIAGNOSIS — Z0000 Encounter for general adult medical examination without abnormal findings: Secondary | ICD-10-CM

## 2015-06-21 DIAGNOSIS — F1420 Cocaine dependence, uncomplicated: Secondary | ICD-10-CM

## 2015-06-21 DIAGNOSIS — N951 Menopausal and female climacteric states: Secondary | ICD-10-CM

## 2015-06-21 DIAGNOSIS — Y939 Activity, unspecified: Secondary | ICD-10-CM

## 2015-06-21 DIAGNOSIS — S40021A Contusion of right upper arm, initial encounter: Secondary | ICD-10-CM

## 2015-06-21 DIAGNOSIS — I10 Essential (primary) hypertension: Secondary | ICD-10-CM

## 2015-06-21 DIAGNOSIS — Y92007 Garden or yard of unspecified non-institutional (private) residence as the place of occurrence of the external cause: Secondary | ICD-10-CM

## 2015-06-21 DIAGNOSIS — S20221A Contusion of right back wall of thorax, initial encounter: Secondary | ICD-10-CM

## 2015-06-21 MED ORDER — CARVEDILOL 12.5 MG PO TABS: 12.5000 mg | ORAL_TABLET | Freq: Two times a day (BID) | ORAL | Status: DC

## 2015-06-21 NOTE — Progress Notes (Signed)
[name], female   DOB: 1960-12-28, 54 y.o.   MRN: [id number]     Subjective:   [name] female    DOB: Jun 08, 1961 54 y.o.    MRN: [id number] Health Maintenance Due: Health Maintenance Due  Topic Date Due  . INFLUENZA VACCINE  05/31/2015    _________________________________________________  HPI: [name] is a 54 y.o. female here for a routine f/u visit.  Pt has a PMH outlined below.  Please see problem-based charting assessment and plan for further details of medical issues addressed at today's visit.  PMH: Past Medical History  Diagnosis Date  . Rectal bleeding 2007    internal hemmorhoids by colonoscopy in 04/2006, Bx neg for IBD  . Duodenitis     secondary to H pylori(treatment completed 05/2006), +/- NSAIDS  . Alcoholism   . History of cocaine abuse   . PE 05/16/2009    CT angio positive for PE in 07/10, treated with coumadin for 8 months.   Marland Kitchen ETOH abuse   . Anxiety   . Depression   . Pulmonary embolism     a. 04/2009 - treated with coumadin x 8 mos.  . DVT (deep venous thrombosis)     a. 04/2009 - treated with coumadin x 8 mos.  . Rectal bleeding     a. int hemorrhoids by colonoscopy in 04/2006, Bx neg for IBD  . Duodenitis     a. 05/2006 2/2 H pylori +/- NSAIDS  . History of CVA (cerebrovascular accident)   . Tobacco abuse   . Chest pain     a. 04/2009 Echo: EF 55-60%, Gr1 DD, Mild MR.  Marland Kitchen Hypertension   . Heart murmur   . Stroke   . Seizures   . ANEMIA, VITAMIN B12 DEFICIENCY 11/11/2007    Documented low 175 on 09/2007; received 6 yrs of IM therapy; switched to po 05/2013     Medications: Current Outpatient Prescriptions on File Prior to Visit  Medication Sig Dispense Refill  . atorvastatin (LIPITOR) 40 MG tablet Take 1 tablet (40 mg total) by mouth daily. 30 tablet 5  . Cholecalciferol 1000 UNITS capsule Take 1 capsule (1,000 Units total) by mouth daily. 30 capsule 12  . famotidine (PEPCID) 20 MG tablet Take 1  tablet (20 mg total) by mouth 2 (two) times daily. 60 tablet 1  . folic acid (FOLVITE) 1 MG tablet Take 1 tablet (1 mg total) by mouth daily. (Patient not taking: Reported on 02/22/2015) 30 tablet 3  . furosemide (LASIX) 20 MG tablet Take 1 tablet (20 mg total) by mouth daily. As needed (Patient not taking: Reported on 02/22/2015) 30 tablet 2  . lisinopril (PRINIVIL,ZESTRIL) 5 MG tablet Take 1 tablet (5 mg total) by mouth daily. 30 tablet 3  . loratadine (CLARITIN) 10 MG tablet Take 10 mg by mouth daily as needed for allergies.     . nitroGLYCERIN (NITROSTAT) 0.4 MG SL tablet Place 1 tablet (0.4 mg total) under the tongue every 5 (five) minutes as needed for chest pain. 25 tablet 5  . potassium chloride (K-DUR) 10 MEQ tablet Take 1 tablet (10 mEq total) by mouth daily. 30 tablet 0  . thiamine 100 MG tablet Take 1 tablet (100 mg total) by mouth daily. 30 tablet 12  . venlafaxine XR (EFFEXOR-XR) 75 MG 24 hr capsule Take 1 capsule (75 mg total) by mouth daily. For hot flashes (Patient not taking: Reported on 02/22/2015) 30 capsule 3  . vitamin  B-12 (CYANOCOBALAMIN) 1000 MCG tablet Take 1,000 mcg by mouth daily.     No current facility-administered medications on file prior to visit.    Allergies: Allergies  Allergen Reactions  . Iodinated Diagnostic Agents Other (See Comments)    Seizure  . Omnipaque [Iohexol] Other (See Comments)    Seizure   . Omnipaque [Iohexol] Other (See Comments)    Seizure     FH: Family History  Problem Relation Age of Onset  . Coronary artery disease      1 st degree female relative <60 and female <50  . Diabetes type II      1 st degree relative  . Breast cancer Mother   . Heart disease Mother   . Heart disease Father   . Heart disease Brother   . Breast cancer      1st degree relative <50  . Diabetes Mother   . CAD Mother   . Colon cancer Father   . CAD Father   . Breast cancer Sister   . Diabetes Sister   . Diabetes Brother   . CAD Brother   . Heart  attack Mother   . Hypertension Mother   . Hypertension Sister     SH: Social History   Social History  . Marital Status: Single    Spouse Name: N/A  . Number of Children: 2  . Years of Education: N/A   Occupational History  . Unemployed    Social History Main Topics  . Smoking status: Former Smoker -- 0.10 packs/day    Types: Cigarettes    Quit date: 01/16/2013  . Smokeless tobacco: Never Used     Comment: smokes about 1-2 cigs/day  . Alcohol Use: Yes     Comment: beer  . Drug Use: No     Comment: former  . Sexual Activity: Not Asked   Other Topics Concern  . None   Social History Narrative   ** Merged History Encounter **       ** Data from: 12/05/12 Enc Dept: [facility]   No cocaine or alcohol use since hospital discharge 05/19/2009.      10/13 update:  States she is smoking 1-2 cigarettes daily with at least 1 drink daily usually Brandy.                  ** Data from: 01/17/13 Enc Dept: [facility]   Pt lives in [place] with a roommate.          Review of Systems: Constitutional: Negative for fever, chills and +weight loss, +sweats Eyes: Negative for blurred vision.  Respiratory: Negative for cough and shortness of breath.  Cardiovascular: Negative for chest pain, palpitations and leg swelling.  Gastrointestinal: Negative for nausea, vomiting, abdominal pain, diarrhea, constipation and blood in stool.  Genitourinary: Negative for dysuria, urgency and frequency.  Musculoskeletal: Negative for myalgias and back pain.  Neurological: Negative for dizziness, weakness and headaches.     Objective:   Vital Signs: Filed Vitals:   06/21/15 1324  BP: 138/72  Pulse: 80  Temp: 98 F (36.7 C)  TempSrc: Oral  Height:  (1.626 m)  Weight: 120 lb 11.2 oz (54.749 kg)  SpO2: 100%      BP Readings from Last 3 Encounters:  06/21/15 138/72  03/01/15 129/77  02/23/15 112/68    Physical Exam: Constitutional: Vital signs reviewed.  Patient  is in NAD and cooperative with exam.  Head: Normocephalic and atraumatic. Eyes: EOMI,  conjunctivae nl, no scleral icterus.  Neck: Supple. Cardiovascular: RRR, no MRG. Pulmonary/Chest: normal effort, CTAB, no wheezes, rales, or rhonchi. Abdominal: Soft. NT/ND +BS. Neurological: A&O x3, cranial nerves II-XII are grossly intact, moving all extremities. Extremities: 2+DP b/l; no pitting edema. Skin: Warm, dry and intact. Large bruise on her right arm and a smaller bruise on her right back under the bra line.    Assessment & Plan:   Assessment and plan was discussed and formulated with my attending.

## 2015-06-21 NOTE — Assessment & Plan Note (Addendum)
Pt presents for f/u of chest pain and was seen in ED in April with UDS +cocaine.  Unfortunately, she has not been back to see Dr. Shirlee Latch.  Denies current episodes of CP. -will have Mamie call to schedule her a f/u appt with Dr. Shirlee Latch, also put in new referral to Dr. Shirlee Latch  -cont current meds  -refilled carvedilol

## 2015-06-21 NOTE — Patient Instructions (Addendum)
Thank you for your visit today.   Please return to the internal medicine clinic in 6 month(s) or sooner if needed.     I have made the following additions/changes to your medications: None, continue current medications.   I will check your thyroid lab today.   You will need to follow up with cardiology as soon as possible.   Please be sure to bring all of your medications with you to every visit; this includes herbal supplements, vitamins, eye drops, and any over-the-counter medications.   Should you have any questions regarding your medications and/or any new or worsening symptoms, please be sure to call the clinic at [phone].   If you believe that you are suffering from a life threatening condition or one that may result in the loss of limb or function, then you should call 911 and proceed to the nearest Emergency Department.   Menopause Menopause is the normal time of life when menstrual periods stop completely. Menopause is complete when you have missed 12 consecutive menstrual periods. It usually occurs between the ages of 48 years and 55 years. Very rarely does a woman develop menopause before the age of 40 years. At menopause, your ovaries stop producing the female hormones estrogen and progesterone. This can cause undesirable symptoms and also affect your health. Sometimes the symptoms may occur 4-5 years before the menopause begins. There is no relationship between menopause and:  Oral contraceptives.  Number of children you had.  Race.  The age your menstrual periods started (menarche). Heavy smokers and very thin women may develop menopause earlier in life. CAUSES  The ovaries stop producing the female hormones estrogen and progesterone.  Other causes include:  Surgery to remove both ovaries.  The ovaries stop functioning for no known reason.  Tumors of the pituitary gland in the brain.  Medical disease that affects the ovaries and hormone  production.  Radiation treatment to the abdomen or pelvis.  Chemotherapy that affects the ovaries. SYMPTOMS   Hot flashes.  Night sweats.  Decrease in sex drive.  Vaginal dryness and thinning of the vagina causing painful intercourse.  Dryness of the skin and developing wrinkles.  Headaches.  Tiredness.  Irritability.  Memory problems.  Weight gain.  Bladder infections.  Hair growth of the face and chest.  Infertility. More serious symptoms include:  Loss of bone (osteoporosis) causing breaks (fractures).  Depression.  Hardening and narrowing of the arteries (atherosclerosis) causing heart attacks and strokes. DIAGNOSIS   When the menstrual periods have stopped for 12 straight months.  Physical exam.  Hormone studies of the blood. TREATMENT  There are many treatment choices and nearly as many questions about them. The decisions to treat or not to treat menopausal changes is an individual choice made with your health care provider. Your health care provider can discuss the treatments with you. Together, you can decide which treatment will work best for you. Your treatment choices may include:   Hormone therapy (estrogen and progesterone).  Non-hormonal medicines.  Treating the individual symptoms with medicine (for example antidepressants for depression).  Herbal medicines that may help specific symptoms.  Counseling by a psychiatrist or psychologist.  Group therapy.  Lifestyle changes including:  Eating healthy.  Regular exercise.  Limiting caffeine and alcohol.  Stress management and meditation.  No treatment. HOME CARE INSTRUCTIONS   Take the medicine your health care provider gives you as directed.  Get plenty of sleep and rest.  Exercise regularly.  Eat a diet that contains  calcium (good for the bones) and soy products (acts like estrogen hormone).  Avoid alcoholic beverages.  Do not smoke.  If you have hot flashes, dress in  layers.  Take supplements, calcium, and vitamin D to strengthen bones.  You can use over-the-counter lubricants or moisturizers for vaginal dryness.  Group therapy is sometimes very helpful.  Acupuncture may be helpful in some cases. SEEK MEDICAL CARE IF:   You are not sure you are in menopause.  You are having menopausal symptoms and need advice and treatment.  You are still having menstrual periods after age 28 years.  You have pain with intercourse.  Menopause is complete (no menstrual period for 12 months) and you develop vaginal bleeding.  You need a referral to a specialist (gynecologist, psychiatrist, or psychologist) for treatment. SEEK IMMEDIATE MEDICAL CARE IF:   You have severe depression.  You have excessive vaginal bleeding.  You fell and think you have a broken bone.  You have pain when you urinate.  You develop leg or chest pain.  You have a fast pounding heart beat (palpitations).  You have severe headaches.  You develop vision problems.  You feel a lump in your breast.  You have abdominal pain or severe indigestion. Document Released: 01/06/2004 Document Revised: 06/18/2013 Document Reviewed: 05/15/2013 [facility] Patient Information 2015 [place], [place]. This information is not intended to replace advice given to you by your health care provider. Make sure you discuss any questions you have with your health care provider.

## 2015-06-21 NOTE — Assessment & Plan Note (Signed)
Controlled. -refilled carvedilol, cont current meds

## 2015-06-21 NOTE — Assessment & Plan Note (Signed)
-  check HIV

## 2015-06-22 LAB — HIV ANTIBODY (ROUTINE TESTING W REFLEX): HIV Screen 4th Generation wRfx: NONREACTIVE

## 2015-06-22 LAB — TSH: TSH: 1.1 u[IU]/mL (ref 0.450–4.500)

## 2015-06-25 ENCOUNTER — Telehealth: Payer: Self-pay | Admitting: Licensed Clinical Social Worker

## 2015-06-25 NOTE — Assessment & Plan Note (Addendum)
Pt states she stopped venlafaxine for hot flashes because she felt it wasn't helping her and made her lethargic.  For now, she is just using a fan.  We discussed wearing lighter clothes,etc. to keep cooler.  She also reports weight loss which is somewhat concerning but has a h/o abnormal TSH according to chart review so plan to check that and also HIV for now.  Weight loss can certainly be d/t illicit drug use including cocaine and also ETOH.  -will cont to monitor, may try other meds if she desires -will check HIV and TSH
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
## 2015-06-25 NOTE — Progress Notes (Signed)
Internal Medicine Clinic Attending  Case discussed with Dr. Gill soon after the resident saw the patient.  We reviewed the resident's history and exam and pertinent patient test results.  I agree with the assessment, diagnosis, and plan of care documented in the resident's note.  
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
## 2015-06-25 NOTE — Assessment & Plan Note (Signed)
-   check TSH
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
## 2015-07-22 ENCOUNTER — Ambulatory Visit (INDEPENDENT_AMBULATORY_CARE_PROVIDER_SITE_OTHER): Payer: Self-pay | Admitting: Physician Assistant

## 2015-07-22 ENCOUNTER — Encounter: Payer: Self-pay | Admitting: Physician Assistant

## 2015-07-22 VITALS — BP 112/58 | HR 88 | Ht 64.0 in | Wt 120.0 lb

## 2015-07-22 DIAGNOSIS — I428 Other cardiomyopathies: Secondary | ICD-10-CM

## 2015-07-22 DIAGNOSIS — R079 Chest pain, unspecified: Secondary | ICD-10-CM

## 2015-07-22 DIAGNOSIS — I10 Essential (primary) hypertension: Secondary | ICD-10-CM

## 2015-07-22 DIAGNOSIS — I429 Cardiomyopathy, unspecified: Secondary | ICD-10-CM

## 2015-07-22 DIAGNOSIS — I340 Nonrheumatic mitral (valve) insufficiency: Secondary | ICD-10-CM

## 2015-07-22 NOTE — Patient Instructions (Addendum)
Medication Instructions:  Your physician recommends that you continue on your current medications as directed. Please refer to the Current Medication list given to you today.   Labwork: NONE ORDERED  Testing/Procedures: Your physician has requested that you have an ECHOCARDIOGRAM AFTER 09-16-15.. Echocardiography is a painless test that uses sound waves to create images of your heart. It provides your doctor with information about the size and shape of your heart and how well your heart's chambers and valves are working. This procedure takes approximately one hour. There are no restrictions for this procedure.    Follow-Up: Your physician recommends that you schedule a follow-up appointment in: 6 MONTHS WITH DR. Shirlee Latch   Any Other Special Instructions Will Be Listed Below (If Applicable).

## 2015-09-20 ENCOUNTER — Other Ambulatory Visit: Payer: Self-pay

## 2015-09-20 ENCOUNTER — Ambulatory Visit (HOSPITAL_COMMUNITY): Payer: Self-pay | Attending: Cardiology

## 2015-09-20 DIAGNOSIS — I10 Essential (primary) hypertension: Secondary | ICD-10-CM | POA: Insufficient documentation

## 2015-09-20 DIAGNOSIS — F17200 Nicotine dependence, unspecified, uncomplicated: Secondary | ICD-10-CM | POA: Insufficient documentation

## 2015-09-20 DIAGNOSIS — R079 Chest pain, unspecified: Secondary | ICD-10-CM

## 2015-09-20 DIAGNOSIS — I340 Nonrheumatic mitral (valve) insufficiency: Secondary | ICD-10-CM | POA: Insufficient documentation

## 2015-09-28 ENCOUNTER — Other Ambulatory Visit (HOSPITAL_COMMUNITY): Payer: Self-pay | Admitting: Cardiology

## 2015-10-27 ENCOUNTER — Ambulatory Visit: Payer: Self-pay | Admitting: Internal Medicine

## 2015-11-05 ENCOUNTER — Ambulatory Visit (HOSPITAL_COMMUNITY)
Admission: RE | Admit: 2015-11-05 | Discharge: 2015-11-05 | Disposition: A | Discharge status: Home / Self Care | Payer: Self-pay | Source: Ambulatory Visit | Attending: Internal Medicine | Admitting: Internal Medicine

## 2015-11-05 VITALS — BP 132/72 | HR 66 | Wt 130.0 lb

## 2015-11-05 DIAGNOSIS — I428 Other cardiomyopathies: Secondary | ICD-10-CM

## 2015-11-05 DIAGNOSIS — I429 Cardiomyopathy, unspecified: Secondary | ICD-10-CM | POA: Insufficient documentation

## 2015-11-05 DIAGNOSIS — Z87891 Personal history of nicotine dependence: Secondary | ICD-10-CM | POA: Insufficient documentation

## 2015-11-05 DIAGNOSIS — Z79899 Other long term (current) drug therapy: Secondary | ICD-10-CM | POA: Insufficient documentation

## 2015-11-05 DIAGNOSIS — E785 Hyperlipidemia, unspecified: Secondary | ICD-10-CM | POA: Insufficient documentation

## 2015-11-05 DIAGNOSIS — I2510 Atherosclerotic heart disease of native coronary artery without angina pectoris: Secondary | ICD-10-CM | POA: Insufficient documentation

## 2015-11-05 DIAGNOSIS — Z8674 Personal history of sudden cardiac arrest: Secondary | ICD-10-CM | POA: Insufficient documentation

## 2015-11-05 DIAGNOSIS — I340 Nonrheumatic mitral (valve) insufficiency: Secondary | ICD-10-CM | POA: Insufficient documentation

## 2015-11-05 DIAGNOSIS — F1010 Alcohol abuse, uncomplicated: Secondary | ICD-10-CM | POA: Insufficient documentation

## 2015-11-05 LAB — BASIC METABOLIC PANEL
ANION GAP: 7 (ref 5–15)
BUN: 11 mg/dL (ref 6–20)
CALCIUM: 9.3 mg/dL (ref 8.9–10.3)
CO2: 26 mmol/L (ref 22–32)
Chloride: 110 mmol/L (ref 101–111)
Creatinine, Ser: 0.74 mg/dL (ref 0.44–1.00)
GFR calc non Af Amer: >60 mL/min (ref >60)
GLUCOSE: 109 mg/dL — AB (ref 65–99)
POTASSIUM: 3.8 mmol/L (ref 3.5–5.1)
Sodium: 143 mmol/L (ref 135–145)

## 2015-11-05 LAB — BRAIN NATRIURETIC PEPTIDE: B Natriuretic Peptide: 96.3 pg/mL (ref 0.0–100.0)

## 2015-11-05 MED ORDER — ATORVASTATIN CALCIUM 40 MG PO TABS: 40.0000 mg | ORAL_TABLET | Freq: Every day | ORAL | Status: DC

## 2015-11-05 NOTE — Patient Instructions (Signed)
Restart Atorvastatin 40 mg daily  Labs today  Your physician has requested that you have an echocardiogram. Echocardiography is a painless test that uses sound waves to create images of your heart. It provides your doctor with information about the size and shape of your heart and how well your heart's chambers and valves are working. This procedure takes approximately one hour. There are no restrictions for this procedure.  Labs in 2 months  We will contact you in 6 months to schedule your next appointment.

## 2015-11-05 NOTE — Progress Notes (Signed)
[name], female   DOB: [birth year], 55 y.o.   MRN: [id number] PCP: Dr. Delane Ginger  55 yo with history of ETOH abuse and prior substance abuse was admitted to [facility] in 3/14 after a cardiac arrest.  She was found down and underwent defibrillation in the field by EMS.  In the hospital, she remained in NSR.  She underwent hypothermia protocol.  LHC showed no CAD.  Initial echo showed EF 25% with moderate MR.  She recovered and was extubated.  Cardiac MRI done prior to discharge showed recovery of EF to 51% with small areas of basal inferior and mid inferolateral delayed enhancement.  Initially, she was hypokalemic.  She also reported having quit drinking a couple of days before the cardiac arrest.  She had been a heavy drinker prior. Followup echo was done in 6/14 showing recovery of LV function, EF 55%.   She had a Cardiolite done in 8/15 that was an intermediate risk study with EF 47% and reversible anterior defect.  She had started drinking and smoking again.  I took her for [facility] in 8/15.  This showed no angiographic CAD with EF 50-55% by LV-gram. LVEDP was elevated, and I started her on Lasix, thinking that some degree of volume overload may be the cause of her exertional symptoms.   She quit smoking in 12/16.  She notes dyspnea after walking 1-2 blocks.  She gets short of breath and has some chest tightness after walking up a hill.  This pattern has been chronic.  No lightheadedness.  No palpitations.  No orthopnea/PND.  She takes Lasix only as needed now.    Labs (4/14): K 3.7, creatinine 0.7, HCT 29.5 Labs (7/15): LDL 106, HDL 90 Labs (8/15): K 3.5, creatinine 0.62, LDL 72, AST 107, ALT 42 Labs (10/15): K 4.1, creatinine 0.74 Labs (4/16): K 3.5, creatinine 0.87 Labs (8/16): HIV negative, TSH normal  PMH: 1. B12 deficiency 2. ETOH abuse 3. LBBB 4. Prior drug abuse 5. Cardiac arrest: 3/14.  Defibrillated in the field.  Hypothermia protocol.  Initial echo with EF 25%, moderate  eccentric MR.  cMRI done a week or so later showed EF 51%, mid to apical septal hypokinesis, normal RV, small area of delayed enhancement in the basal inferior and mid inferolateral walls, mild MR.  LHC showed no CAD.  Echo (6/14) with EF 55%, mild septal hypokinesis, normal RV size and systolic function.  Lexiscan Cardiolite (8/15) with EF 47%, reversible anterior defect and fixed apical defect; intermediate risk.  Echo (11/15) with EF 55%, septal-lateral dyssynchrony, moderate eccentric MR.  6. Mitral regurgitation: Echo in 11/15 with moderate eccentric MR.   SH: ETOH abuse, quit again in 8/15.  Prior drug abuse, now abstinent.  Quit smoking in 8/15, restarted, quit again in 12/16.   FH: No CAD, no cardiomyopathy, no cardiac arrest.   ROS: All systems reviewed and negative except as per HPI.   Current Outpatient Prescriptions  Medication Sig Dispense Refill  . carvedilol (COREG) 12.5 MG tablet Take 1 tablet (12.5 mg total) by mouth 2 (two) times daily. 60 tablet 3  . Cholecalciferol 1000 UNITS capsule Take 1 capsule (1,000 Units total) by mouth daily. 30 capsule 12  . furosemide (LASIX) 20 MG tablet Take 1 tablet (20 mg total) by mouth daily. As needed 30 tablet 2  . lisinopril (PRINIVIL,ZESTRIL) 5 MG tablet TAKE ONE TABLET BY MOUTH DAILY 30 tablet 0  . loratadine (CLARITIN) 10 MG tablet Take 10 mg by mouth  daily as needed for allergies.     . nitroGLYCERIN (NITROSTAT) 0.4 MG SL tablet Place 1 tablet (0.4 mg total) under the tongue every 5 (five) minutes as needed for chest pain. 25 tablet 5  . potassium chloride (K-DUR) 10 MEQ tablet Take 1 tablet (10 mEq total) by mouth daily. 30 tablet 0  . vitamin B-12 (CYANOCOBALAMIN) 1000 MCG tablet Take 1,000 mcg by mouth daily.    Marland Kitchen atorvastatin (LIPITOR) 40 MG tablet Take 1 tablet (40 mg total) by mouth daily. 30 tablet 6   No current facility-administered medications for this encounter.    BP 132/72 mmHg  Pulse 66  Wt 130 lb (58.968 kg)  SpO2  99%  LMP 11/02/2010 General: NAD Neck: No JVD, no thyromegaly or thyroid nodule.  Lungs: Clear to auscultation bilaterally with normal respiratory effort. CV: Nondisplaced PMI.  Heart regular S1/S2, no S3/S4, 1/6 HSM at apex.  No peripheral edema.  No carotid bruit.  Normal pedal pulses.  Abdomen: Soft, nontender, no hepatosplenomegaly, no distention.   Neurologic: Alert and oriented x 3.  Psych: Normal affect. Extremities: No clubbing or cyanosis.    Assessment/Plan: 1. Cardiac arrest:  Cause of initial arrest in 2014 is somewhat uncertain.  No CAD at that time.  Initial EF was 25% by echo but she had recovery of EF to 51% by cardiac MRI by the time of discharge, so the initial low EF may have been related to myocardial stunning from the arrest.  She was hypokalemic when she first arrived to the ER.  Also, talking to her later on, it sounds like she had quit drinking 2-3 days prior to the arrest.  She was a heavy drinker before this.  It is possible that the arrest could be related to an adrenergic surge from ETOH withdrawal.  She had to be treated for withdrawal prior to extubation.  Repeat echo in 6/14 showed normal EF, so ICD was not placed. 2. ETOH abuse: She has been avoiding ETOH. 3. Cardiomyopathy: EF had recovered to normal in 6/14 but was 47% on 8/15 Cardiolite.  She had started drinking again, so this may be an ETOH-mediated cardiomyopathy.  She quit drinking and EF improved to 55% in 11/15.  She continues to have some exertional dyspnea.  - I will get an echo to make sure that EF remains normal and to follow MR.  - Continue Coreg and lisinopril at current doses.  4. CAD: Patient has had a long-standing pattern of exertional chest tightness.  LHC (8/15) showed no angiographic CAD.  5. Mitral regurgitation: Moderate in past, will be getting repeat echo.   6. Hyperlipidemia: Restart atorvastatin and check lipids/LFTs in 2 months.    [name] 11/05/2015

## 2015-11-16 ENCOUNTER — Ambulatory Visit (HOSPITAL_COMMUNITY): Payer: Self-pay

## 2015-12-13 ENCOUNTER — Encounter: Payer: Self-pay | Admitting: Internal Medicine

## 2015-12-20 ENCOUNTER — Encounter: Payer: Self-pay | Admitting: Internal Medicine

## 2015-12-22 ENCOUNTER — Emergency Department (HOSPITAL_COMMUNITY): Payer: Self-pay

## 2015-12-22 ENCOUNTER — Emergency Department (HOSPITAL_COMMUNITY)
Admission: EM | Admit: 2015-12-22 | Discharge: 2015-12-22 | Disposition: A | Discharge status: Home / Self Care | Payer: Self-pay | Attending: Emergency Medicine | Admitting: Emergency Medicine

## 2015-12-22 ENCOUNTER — Encounter (HOSPITAL_COMMUNITY): Payer: Self-pay | Admitting: Emergency Medicine

## 2015-12-22 DIAGNOSIS — F419 Anxiety disorder, unspecified: Secondary | ICD-10-CM | POA: Insufficient documentation

## 2015-12-22 DIAGNOSIS — D518 Other vitamin B12 deficiency anemias: Secondary | ICD-10-CM | POA: Insufficient documentation

## 2015-12-22 DIAGNOSIS — Z9889 Other specified postprocedural states: Secondary | ICD-10-CM | POA: Insufficient documentation

## 2015-12-22 DIAGNOSIS — I10 Essential (primary) hypertension: Secondary | ICD-10-CM | POA: Insufficient documentation

## 2015-12-22 DIAGNOSIS — Z8673 Personal history of transient ischemic attack (TIA), and cerebral infarction without residual deficits: Secondary | ICD-10-CM | POA: Insufficient documentation

## 2015-12-22 DIAGNOSIS — F1210 Cannabis abuse, uncomplicated: Secondary | ICD-10-CM | POA: Insufficient documentation

## 2015-12-22 DIAGNOSIS — F10120 Alcohol abuse with intoxication, uncomplicated: Secondary | ICD-10-CM | POA: Insufficient documentation

## 2015-12-22 DIAGNOSIS — Z86711 Personal history of pulmonary embolism: Secondary | ICD-10-CM | POA: Insufficient documentation

## 2015-12-22 DIAGNOSIS — Z86718 Personal history of other venous thrombosis and embolism: Secondary | ICD-10-CM | POA: Insufficient documentation

## 2015-12-22 DIAGNOSIS — Z79899 Other long term (current) drug therapy: Secondary | ICD-10-CM | POA: Insufficient documentation

## 2015-12-22 DIAGNOSIS — R55 Syncope and collapse: Secondary | ICD-10-CM | POA: Insufficient documentation

## 2015-12-22 DIAGNOSIS — R011 Cardiac murmur, unspecified: Secondary | ICD-10-CM | POA: Insufficient documentation

## 2015-12-22 DIAGNOSIS — R079 Chest pain, unspecified: Secondary | ICD-10-CM | POA: Insufficient documentation

## 2015-12-22 DIAGNOSIS — R569 Unspecified convulsions: Secondary | ICD-10-CM | POA: Insufficient documentation

## 2015-12-22 DIAGNOSIS — Z8719 Personal history of other diseases of the digestive system: Secondary | ICD-10-CM | POA: Insufficient documentation

## 2015-12-22 DIAGNOSIS — F10920 Alcohol use, unspecified with intoxication, uncomplicated: Secondary | ICD-10-CM

## 2015-12-22 DIAGNOSIS — F17210 Nicotine dependence, cigarettes, uncomplicated: Secondary | ICD-10-CM | POA: Insufficient documentation

## 2015-12-22 LAB — COMPREHENSIVE METABOLIC PANEL
ALBUMIN: 3.8 g/dL (ref 3.5–5.0)
ALT: 39 U/L (ref 14–54)
AST: 38 U/L (ref 15–41)
Alkaline Phosphatase: 54 U/L (ref 38–126)
Anion gap: 13 (ref 5–15)
BILIRUBIN TOTAL: 0.4 mg/dL (ref 0.3–1.2)
BUN: 9 mg/dL (ref 6–20)
CHLORIDE: 111 mmol/L (ref 101–111)
CO2: 23 mmol/L (ref 22–32)
CREATININE: 0.69 mg/dL (ref 0.44–1.00)
Calcium: 9.6 mg/dL (ref 8.9–10.3)
GFR calc Af Amer: >60 mL/min (ref >60)
GLUCOSE: 83 mg/dL (ref 65–99)
POTASSIUM: 4.7 mmol/L (ref 3.5–5.1)
Sodium: 147 mmol/L — ABNORMAL HIGH (ref 135–145)
Total Protein: 7 g/dL (ref 6.5–8.1)

## 2015-12-22 LAB — CBC
HEMATOCRIT: 34.5 % — AB (ref 36.0–46.0)
Hemoglobin: 11.2 g/dL — ABNORMAL LOW (ref 12.0–15.0)
MCH: 30.1 pg (ref 26.0–34.0)
MCHC: 32.5 g/dL (ref 30.0–36.0)
MCV: 92.7 fL (ref 78.0–100.0)
PLATELETS: 252 10*3/uL (ref 150–400)
RBC: 3.72 MIL/uL — AB (ref 3.87–5.11)
RDW: 15.2 % (ref 11.5–15.5)
WBC: 6.7 10*3/uL (ref 4.0–10.5)

## 2015-12-22 LAB — PROTIME-INR
INR: 1.14 (ref 0.00–1.49)
PROTHROMBIN TIME: 14.8 s (ref 11.6–15.2)

## 2015-12-22 LAB — I-STAT TROPONIN, ED
TROPONIN I, POC: 0.01 ng/mL (ref 0.00–0.08)
Troponin i, poc: 0.01 ng/mL (ref 0.00–0.08)

## 2015-12-22 LAB — RAPID URINE DRUG SCREEN, HOSP PERFORMED
AMPHETAMINES: NOT DETECTED
BENZODIAZEPINES: NOT DETECTED
Barbiturates: NOT DETECTED
COCAINE: POSITIVE — AB
Opiates: NOT DETECTED
Tetrahydrocannabinol: NOT DETECTED

## 2015-12-22 LAB — APTT: APTT: 32 s (ref 24–37)

## 2015-12-22 LAB — ETHANOL: ALCOHOL ETHYL (B): 241 mg/dL — AB (ref <5)

## 2015-12-22 MED ORDER — ASPIRIN 81 MG PO CHEW: 324.0000 mg | CHEWABLE_TABLET | Freq: Once | ORAL | Status: DC

## 2015-12-22 MED ORDER — SODIUM CHLORIDE 0.9 % IV SOLN: INTRAVENOUS | Status: DC

## 2015-12-22 MED ORDER — SODIUM CHLORIDE 0.9 % IV BOLUS (SEPSIS)
1000.0000 mL | Freq: Once | INTRAVENOUS | Status: AC
  Administered 2015-12-22: 1000 mL via INTRAVENOUS

## 2015-12-22 NOTE — ED Notes (Signed)
Seizure pads applied to the bed.

## 2015-12-22 NOTE — ED Notes (Signed)
Pt had seizure this morning with hx of such. Pt has not taken seizure meds this morning. Pt post ictal for EMS upon arrival. Pt alert, can explain events that occurred, but has delayed responses. Pt complained of CP when EMS arrived as well. Substernal pain radiating to L shoulder and into neck. Pt has hx of MI. 324mg  ASA and 1 nitro given.  Pain decreased from 5/10 to 1/10.  EKG showed NSR 78, BP125/80

## 2015-12-22 NOTE — ED Provider Notes (Signed)
CSN: [id number]     Arrival date & time 12/22/15  1118 History   First MD Initiated Contact with Patient 12/22/15 1121     Chief Complaint  Patient presents with  . Chest Pain  . Seizures    HPI Patient presented to the emergency room for evaluation after having a possible seizure or syncopal episode this morning. Patient states she was at home. She started to get stressed worrying about some family members. Patient felt anxious and then had an episode where she lost consciousness. Family members said she was on the couch and had what appeared to be a seizure. Patient does not remember this episode but does remember EMS arriving. When she woke up she complained of pain in her chest. It's sharp on the left side. She was given aspirin and nitroglycerin and the symptoms have almost completely resolved. Patient states she has history of congestive heart failure but does not have any history of coronary artery disease, stents or bypass surgery She has had episodes of passing out/seizures in the past when she has been depressed. Past Medical History  Diagnosis Date  . Rectal bleeding 2007    internal hemmorhoids by colonoscopy in 04/2006, Bx neg for IBD  . Duodenitis     secondary to H pylori(treatment completed 05/2006), +/- NSAIDS  . Alcoholism (HCC)   . History of cocaine abuse   . PE 05/16/2009    CT angio positive for PE in 07/10, treated with coumadin for 8 months.   Marland Kitchen ETOH abuse   . Anxiety   . Depression   . Pulmonary embolism (HCC)     a. 04/2009 - treated with coumadin x 8 mos.  . DVT (deep venous thrombosis) (HCC)     a. 04/2009 - treated with coumadin x 8 mos.  . Rectal bleeding     a. int hemorrhoids by colonoscopy in 04/2006, Bx neg for IBD  . Duodenitis     a. 05/2006 2/2 H pylori +/- NSAIDS  . History of CVA (cerebrovascular accident)   . Tobacco abuse   . Chest pain     a. 04/2009 Echo: EF 55-60%, Gr1 DD, Mild MR.  Marland Kitchen Hypertension   . Heart murmur   . Stroke (HCC)   .  Seizures (HCC)   . ANEMIA, VITAMIN B12 DEFICIENCY 11/11/2007    Documented low 175 on 09/2007; received 6 yrs of IM therapy; switched to po 05/2013   . Cardiac arrest (HCC) 01/16/2013    witnessed, shockable rhythm, initally low EF, no CAD at cath. Drug screen negative.   Past Surgical History  Procedure Laterality Date  . Cesarean section    . Left heart catheterization with coronary angiogram N/A 06/23/2014    Procedure: LEFT HEART CATHETERIZATION WITH CORONARY ANGIOGRAM;  Surgeon: Laurey Morale, MD; No CAD   Family History  Problem Relation Age of Onset  . Coronary artery disease      1 st degree female relative <60 and female <50  . Diabetes type II      1 st degree relative  . Breast cancer Mother   . Heart disease Mother   . Heart disease Father   . Heart disease Brother   . Breast cancer      1st degree relative <50  . Diabetes Mother   . CAD Mother   . Colon cancer Father   . CAD Father   . Breast cancer Sister   . Diabetes Sister   . Diabetes Brother   .  CAD Brother   . Heart attack Mother   . Hypertension Mother   . Hypertension Sister    Social History  Substance Use Topics  . Smoking status: Current Some Day Smoker -- 0.10 packs/day    Types: Cigarettes  . Smokeless tobacco: Never Used     Comment: smokes about 1-2 cigs/day  . Alcohol Use: Yes     Comment: beer   OB History    No data available     Review of Systems  All other systems reviewed and are negative.     Allergies  Iodinated diagnostic agents; Omnipaque; and Omnipaque  Home Medications   Prior to Admission medications   Medication Sig Start Date End Date Taking? Authorizing Provider  atorvastatin (LIPITOR) 40 MG tablet Take 1 tablet (40 mg total) by mouth daily. 11/05/15  Yes Laurey Morale, MD  carvedilol (COREG) 12.5 MG tablet Take 1 tablet (12.5 mg total) by mouth 2 (two) times daily. 06/21/15  Yes Marrian Salvage, MD  Cholecalciferol 1000 UNITS capsule Take 1 capsule (1,000 Units  total) by mouth daily. 11/03/13  Yes Marrian Salvage, MD  furosemide (LASIX) 20 MG tablet Take 1 tablet (20 mg total) by mouth daily. As needed 09/15/14  Yes Laurey Morale, MD  lisinopril (PRINIVIL,ZESTRIL) 5 MG tablet TAKE ONE TABLET BY MOUTH DAILY 09/28/15  Yes Laurey Morale, MD  nitroGLYCERIN (NITROSTAT) 0.4 MG SL tablet Place 1 tablet (0.4 mg total) under the tongue every 5 (five) minutes as needed for chest pain. 06/08/14  Yes Dyann Kief, PA-C  potassium chloride (K-DUR) 10 MEQ tablet Take 1 tablet (10 mEq total) by mouth daily. 08/24/14  Yes Marrian Salvage, MD  vitamin B-12 (CYANOCOBALAMIN) 1000 MCG tablet Take 1,000 mcg by mouth daily.   Yes Historical Provider, MD  loratadine (CLARITIN) 10 MG tablet Take 10 mg by mouth daily as needed for allergies.     Historical Provider, MD   BP 111/66 mmHg  Pulse 70  Resp 18  SpO2 99%  LMP 11/02/2010 Physical Exam  Constitutional: She appears well-developed and well-nourished. No distress.  HENT:  Head: Normocephalic and atraumatic.  Right Ear: External ear normal.  Left Ear: External ear normal.  Eyes: Conjunctivae are normal. Right eye exhibits no discharge. Left eye exhibits no discharge. No scleral icterus.  Neck: Neck supple. No tracheal deviation present.  Cardiovascular: Normal rate, regular rhythm and intact distal pulses.   Pulmonary/Chest: Effort normal and breath sounds normal. No stridor. No respiratory distress. She has no wheezes. She has no rales.  Abdominal: Soft. Bowel sounds are normal. She exhibits no distension. There is no tenderness. There is no rebound and no guarding.  Musculoskeletal: She exhibits no edema or tenderness.  Neurological: She is alert. She has normal strength. No cranial nerve deficit (no facial droop, extraocular movements intact, no slurred speech) or sensory deficit. She exhibits normal muscle tone. She displays no seizure activity. Coordination normal.  Skin: Skin is warm and dry. No rash  noted.  Psychiatric: Her mood appears anxious.  Nursing note and vitals reviewed.   ED Course  Procedures (including critical care time) Labs Review Labs Reviewed  CBC - Abnormal; Notable for the following:    RBC 3.72 (*)    Hemoglobin 11.2 (*)    HCT 34.5 (*)    All other components within normal limits  COMPREHENSIVE METABOLIC PANEL - Abnormal; Notable for the following:    Sodium 147 (*)    All other  components within normal limits  ETHANOL - Abnormal; Notable for the following:    Alcohol, Ethyl (B) 241 (*)    All other components within normal limits  URINE RAPID DRUG SCREEN, HOSP PERFORMED - Abnormal; Notable for the following:    Cocaine POSITIVE (*)    All other components within normal limits  APTT  PROTIME-INR  I-STAT TROPOININ, ED  Rosezena Sensor, ED    Imaging Review Dg Chest Portable 1 View  12/22/2015  CLINICAL DATA:  Seizures.  Hypertension. EXAM: PORTABLE CHEST 1 VIEW COMPARISON:  02/22/2015. FINDINGS: Interval enlargement of the cardiac silhouette and increased prominence of the upper lung zone pulmonary vasculature. Stable mildly prominent interstitial markings. Clear lungs. Mild scoliosis. IMPRESSION: 1. Interval cardiomegaly and pulmonary vascular congestion. 2. Stable mild chronic interstitial lung disease. Electronically Signed   By: Beckie Salts M.D.   On: 12/22/2015 12:20   I have personally reviewed and evaluated these images and lab results as part of my medical decision-making.   EKG Interpretation   Date/Time:  Wednesday December 22 2015 11:26:07 EST Ventricular Rate:  79 PR Interval:  189 QRS Duration: 132 QT Interval:  468 QTC Calculation: 537 R Axis:   17 Text Interpretation:  Sinus rhythm Probable left atrial enlargement Left  bundle branch block Artifact in lead(s) I II aVR aVL aVF Poor data quality  No significant change since last tracing Confirmed by Mario Voong  MD-J, Kamariyah Timberlake  ([id number]) on 12/22/2015 11:35:57 AM      MDM   Final  diagnoses:  Chest pain, unspecified chest pain type  Alcohol intoxication, uncomplicated (HCC)    The patient presented to the emergency room with an episode of chest pain and possible seizure type episode. The patient has remained alert and awake in the emergency department. Patient does not have a history of cardiac disease. According to the records she had a normal cardiac catheterization previously. Patient's evaluation was notable for an elevated EtOH level as well as a urine drug screen positive for cocaine. I suspect this may be contributed to the possible seizure episode as well as her episode of unresponsiveness.  Patient was monitored in the emergency room for several hours. She has remained hemodynamically stable she is alert without any chest pain or seizure like activity. Serial troponins are normal. Patient was able to eat a meal and feels much better. Discussed avoiding cocaine and alcohol. Follow-up with primary doctor    Linwood Dibbles, MD 12/22/15 1504

## 2015-12-22 NOTE — Discharge Instructions (Signed)
Alcohol Use Disorder °Alcohol use disorder is a mental disorder. It is not a one-time incident of heavy drinking. Alcohol use disorder is the excessive and uncontrollable use of alcohol over time that leads to problems with functioning in one or more areas of daily living. People with this disorder risk harming themselves and others when they drink to excess. Alcohol use disorder also can cause other mental disorders, such as mood and anxiety disorders, and serious physical problems. People with alcohol use disorder often misuse other drugs.  °Alcohol use disorder is common and widespread. Some people with this disorder drink alcohol to cope with or escape from negative life events. Others drink to relieve chronic pain or symptoms of mental illness. People with a family history of alcohol use disorder are at higher risk of losing control and using alcohol to excess.  °Drinking too much alcohol can cause injury, accidents, and health problems. One drink can be too much when you are: °· Working. °· Pregnant or breastfeeding. °· Taking medicines. Ask your doctor. °· Driving or planning to drive. °SYMPTOMS  °Signs and symptoms of alcohol use disorder may include the following:  °· Consumption of alcohol in larger amounts or over a longer period of time than intended. °· Multiple unsuccessful attempts to cut down or control alcohol use.   °· A great deal of time spent obtaining alcohol, using alcohol, or recovering from the effects of alcohol (hangover). °· A strong desire or urge to use alcohol (cravings).   °· Continued use of alcohol despite problems at work, school, or home because of alcohol use.   °· Continued use of alcohol despite problems in relationships because of alcohol use. °· Continued use of alcohol in situations when it is physically hazardous, such as driving a car. °· Continued use of alcohol despite awareness of a physical or psychological problem that is likely related to alcohol use. Physical  problems related to alcohol use can involve the brain, heart, liver, stomach, and intestines. Psychological problems related to alcohol use include intoxication, depression, anxiety, psychosis, delirium, and dementia.   °· The need for increased amounts of alcohol to achieve the same desired effect, or a decreased effect from the consumption of the same amount of alcohol (tolerance). °· Withdrawal symptoms upon reducing or stopping alcohol use, or alcohol use to reduce or avoid withdrawal symptoms. Withdrawal symptoms include: °· Racing heart. °· Hand tremor. °· Difficulty sleeping. °· Nausea. °· Vomiting. °· Hallucinations. °· Restlessness. °· Seizures. °DIAGNOSIS °Alcohol use disorder is diagnosed through an assessment by your health care provider. Your health care provider may start by asking three or four questions to screen for excessive or problematic alcohol use. To confirm a diagnosis of alcohol use disorder, at least two symptoms must be present within a 12-month period. The severity of alcohol use disorder depends on the number of symptoms: °· Mild--two or three. °· Moderate--four or five. °· Severe--six or more. °Your health care provider may perform a physical exam or use results from lab tests to see if you have physical problems resulting from alcohol use. Your health care provider may refer you to a mental health professional for evaluation. °TREATMENT  °Some people with alcohol use disorder are able to reduce their alcohol use to low-risk levels. Some people with alcohol use disorder need to quit drinking alcohol. When necessary, mental health professionals with specialized training in substance use treatment can help. Your health care provider can help you decide how severe your alcohol use disorder is and what type of treatment you need.   The following forms of treatment are available:   Detoxification. Detoxification involves the use of prescription medicines to prevent alcohol withdrawal  symptoms in the first week after quitting. This is important for people with a history of symptoms of withdrawal and for heavy drinkers who are likely to have withdrawal symptoms. Alcohol withdrawal can be dangerous and, in severe cases, cause death. Detoxification is usually provided in a hospital or in-patient substance use treatment facility.  Counseling or talk therapy. Talk therapy is provided by substance use treatment counselors. It addresses the reasons people use alcohol and ways to keep them from drinking again. The goals of talk therapy are to help people with alcohol use disorder find healthy activities and ways to cope with life stress, to identify and avoid triggers for alcohol use, and to handle cravings, which can cause relapse.  Medicines.Different medicines can help treat alcohol use disorder through the following actions:  Decrease alcohol cravings.  Decrease the positive reward response felt from alcohol use.  Produce an uncomfortable physical reaction when alcohol is used (aversion therapy).  Support groups. Support groups are run by people who have quit drinking. They provide emotional support, advice, and guidance. These forms of treatment are often combined. Some people with alcohol use disorder benefit from intensive combination treatment provided by specialized substance use treatment centers. Both inpatient and outpatient treatment programs are available.   This information is not intended to replace advice given to you by your health care provider. Make sure you discuss any questions you have with your health care provider.   Document Released: 11/23/2004 Document Revised: 11/06/2014 Document Reviewed: 01/23/2013 Elsevier Interactive Patient Education 2016 [employer]  Nonspecific Chest Pain  Chest pain can be caused by many different conditions. There is always a chance that your pain could be related to something serious, such as a heart attack or a blood clot  in your lungs. Chest pain can also be caused by conditions that are not life-threatening. If you have chest pain, it is very important to follow up with your health care provider. CAUSES  Chest pain can be caused by:  Heartburn.  Pneumonia or bronchitis.  Anxiety or stress.  Inflammation around your heart (pericarditis) or lung (pleuritis or pleurisy).  A blood clot in your lung.  A collapsed lung (pneumothorax). It can develop suddenly on its own (spontaneous pneumothorax) or from trauma to the chest.  Shingles infection (varicella-zoster virus).  Heart attack.  Damage to the bones, muscles, and cartilage that make up your chest wall. This can include:  Bruised bones due to injury.  Strained muscles or cartilage due to frequent or repeated coughing or overwork.  Fracture to one or more ribs.  Sore cartilage due to inflammation (costochondritis). RISK FACTORS  Risk factors for chest pain may include:  Activities that increase your risk for trauma or injury to your chest.  Respiratory infections or conditions that cause frequent coughing.  Medical conditions or overeating that can cause heartburn.  Heart disease or family history of heart disease.  Conditions or health behaviors that increase your risk of developing a blood clot.  Having had chicken pox (varicella zoster). SIGNS AND SYMPTOMS Chest pain can feel like:  Burning or tingling on the surface of your chest or deep in your chest.  Crushing, pressure, aching, or squeezing pain.  Dull or sharp pain that is worse when you move, cough, or take a deep breath.  Pain that is also felt in your back, neck, shoulder, or arm,  or pain that spreads to any of these areas. Your chest pain may come and go, or it may stay constant. DIAGNOSIS Lab tests or other studies may be needed to find the cause of your pain. Your health care provider may have you take a test called an ambulatory ECG (electrocardiogram). An ECG  records your heartbeat patterns at the time the test is performed. You may also have other tests, such as:  Transthoracic echocardiogram (TTE). During echocardiography, sound waves are used to create a picture of all of the heart structures and to look at how blood flows through your heart.  Transesophageal echocardiogram (TEE).This is a more advanced imaging test that obtains images from inside your body. It allows your health care provider to see your heart in finer detail.  Cardiac monitoring. This allows your health care provider to monitor your heart rate and rhythm in real time.  Holter monitor. This is a portable device that records your heartbeat and can help to diagnose abnormal heartbeats. It allows your health care provider to track your heart activity for several days, if needed.  Stress tests. These can be done through exercise or by taking medicine that makes your heart beat more quickly.  Blood tests.  Imaging tests. TREATMENT  Your treatment depends on what is causing your chest pain. Treatment may include:  Medicines. These may include:  Acid blockers for heartburn.  Anti-inflammatory medicine.  Pain medicine for inflammatory conditions.  Antibiotic medicine, if an infection is present.  Medicines to dissolve blood clots.  Medicines to treat coronary artery disease.  Supportive care for conditions that do not require medicines. This may include:  Resting.  Applying heat or cold packs to injured areas.  Limiting activities until pain decreases. HOME CARE INSTRUCTIONS  If you were prescribed an antibiotic medicine, finish it all even if you start to feel better.  Avoid any activities that bring on chest pain.  Do not use any tobacco products, including cigarettes, chewing tobacco, or electronic cigarettes. If you need help quitting, ask your health care provider.  Do not drink alcohol.  Take medicines only as directed by your health care  provider.  Keep all follow-up visits as directed by your health care provider. This is important. This includes any further testing if your chest pain does not go away.  If heartburn is the cause for your chest pain, you may be told to keep your head raised (elevated) while sleeping. This reduces the chance that acid will go from your stomach into your esophagus.  Make lifestyle changes as directed by your health care provider. These may include:  Getting regular exercise. Ask your health care provider to suggest some activities that are safe for you.  Eating a heart-healthy diet. A registered dietitian can help you to learn healthy eating options.  Maintaining a healthy weight.  Managing diabetes, if necessary.  Reducing stress. SEEK MEDICAL CARE IF:  Your chest pain does not go away after treatment.  You have a rash with blisters on your chest.  You have a fever. SEEK IMMEDIATE MEDICAL CARE IF:   Your chest pain is worse.  You have an increasing cough, or you cough up blood.  You have severe abdominal pain.  You have severe weakness.  You faint.  You have chills.  You have sudden, unexplained chest discomfort.  You have sudden, unexplained discomfort in your arms, back, neck, or jaw.  You have shortness of breath at any time.  You suddenly start to sweat,  or your skin gets clammy.  You feel nauseous or you vomit.  You suddenly feel light-headed or dizzy.  Your heart begins to beat quickly, or it feels like it is skipping beats. These symptoms may represent a serious problem that is an emergency. Do not wait to see if the symptoms will go away. Get medical help right away. Call your local emergency services (911 in the [place] Do not drive yourself to the hospital.   This information is not intended to replace advice given to you by your health care provider. Make sure you discuss any questions you have with your health care provider.   Document Released:  07/26/2005 Document Revised: 11/06/2014 Document Reviewed: 05/22/2014 Elsevier Interactive Patient Education [employer]

## 2015-12-22 NOTE — ED Notes (Signed)
Pt's family at bedside states when pt having seizure she was shaking all over and eyes rolled back in head. Then pt stated she had chest pain after episode.

## 2016-01-03 ENCOUNTER — Other Ambulatory Visit (HOSPITAL_COMMUNITY): Payer: Self-pay

## 2016-02-03 ENCOUNTER — Encounter: Payer: Self-pay | Admitting: Internal Medicine

## 2016-02-18 ENCOUNTER — Telehealth: Payer: Self-pay | Admitting: Internal Medicine

## 2016-02-18 NOTE — Telephone Encounter (Signed)
APPT. REMINDER CALL, NO ANSWER AND NO VOICE MAIL °

## 2016-02-21 ENCOUNTER — Ambulatory Visit (INDEPENDENT_AMBULATORY_CARE_PROVIDER_SITE_OTHER): Payer: Self-pay | Admitting: Internal Medicine

## 2016-02-21 VITALS — BP 148/69 | HR 80 | Temp 98.1°F | Ht 64.0 in | Wt 139.5 lb

## 2016-02-21 DIAGNOSIS — F17210 Nicotine dependence, cigarettes, uncomplicated: Secondary | ICD-10-CM

## 2016-02-21 DIAGNOSIS — F1910 Other psychoactive substance abuse, uncomplicated: Secondary | ICD-10-CM

## 2016-02-21 DIAGNOSIS — D518 Other vitamin B12 deficiency anemias: Secondary | ICD-10-CM

## 2016-02-21 DIAGNOSIS — Z0000 Encounter for general adult medical examination without abnormal findings: Secondary | ICD-10-CM

## 2016-02-21 DIAGNOSIS — I10 Essential (primary) hypertension: Secondary | ICD-10-CM

## 2016-02-21 MED ORDER — THIAMINE HCL 100 MG PO TABS: 100.0000 mg | ORAL_TABLET | Freq: Every day | ORAL | Status: DC

## 2016-02-21 NOTE — Assessment & Plan Note (Signed)
Pt states she has been out of her BP meds for the past several weeks (specifically lisinopril and carvedilol).  She reports not having the money for her meds. -advised her to cont taking her meds -cont to abstain from alcohol and other illicit drugs that may increase her BP

## 2016-02-21 NOTE — Assessment & Plan Note (Addendum)
-  needs mammogram, pap smear (requested she make a return f/u appointment for a pap smear)  -counseled on ETOH and cocaine use -counseled on smoking cessation, currently smokes 1 cig/day

## 2016-02-21 NOTE — Patient Instructions (Signed)
Thank you for your visit today.   Please return to the internal medicine clinic in about 3 months for a pap smear.     I have made the following additions/changes to your medications:  Continue your current medications. You should also be taking thiamine, B1, daily. I will send this to your pharmacy.  You need a mammogram and also a pap smear. I would advise to discontinue alcohol and other drugs.  Please also try to quit smoking.   Please be sure to bring all of your medications with you to every visit; this includes herbal supplements, vitamins, eye drops, and any over-the-counter medications.   Should you have any questions regarding your medications and/or any new or worsening symptoms, please be sure to call the clinic at [phone].   If you believe that you are suffering from a life threatening condition or one that may result in the loss of limb or function, then you should call 911 and proceed to the nearest Emergency Department.   A healthy lifestyle and preventative care can promote health and wellness.   Maintain regular health, dental, and eye exams.  Eat a healthy diet. Foods like vegetables, fruits, whole grains, low-fat dairy products, and lean protein foods contain the nutrients you need without too many calories. Decrease your intake of foods high in solid fats, added sugars, and salt. Get information about a proper diet from your caregiver, if necessary.  Regular physical exercise is one of the most important things you can do for your health. Most adults should get at least 150 minutes of moderate-intensity exercise (any activity that increases your heart rate and causes you to sweat) each week. In addition, most adults need muscle-strengthening exercises on 2 or more days a week.   Maintain a healthy weight. The body mass index (BMI) is a screening tool to identify possible weight problems. It provides an estimate of body fat based on height and weight. Your  caregiver can help determine your BMI, and can help you achieve or maintain a healthy weight. For adults 20 years and older:  A BMI below 18.5 is considered underweight.  A BMI of 18.5 to 24.9 is normal.  A BMI of 25 to 29.9 is considered overweight.  A BMI of 30 and above is considered obese.

## 2016-02-21 NOTE — Assessment & Plan Note (Addendum)
Pt reports the last time she used cocaine and ETOH was in January 2017.  Denies any recent use.  However, UDS+ for cocaine and ETOH while in the ED in February 2017 for chest pain.  She is still smoking 1 cig/day.  States that she is trying to stay inside because there is "drugs all around her if she leave the house." -counseled on abstaining from illicit drug use and cigarettes

## 2016-02-21 NOTE — Progress Notes (Signed)
[name], female   DOB: 06-11-1961, 55 y.o.   MRN: [id number]     Subjective:   [name] female    DOB: [birth year] 55 y.o.    MRN: [id number] Health Maintenance Due: Health Maintenance Due  Topic Date Due  . PAP SMEAR  11/26/2014  . MAMMOGRAM  10/10/2015    _________________________________________________  HPI: [name] is a 55 y.o. female here for follow up.  Pt has a PMH outlined below.  Please see problem-based charting assessment and plan for further status of patient's chronic medical problems addressed at today's visit.  PMH: Past Medical History  Diagnosis Date  . Rectal bleeding 2007    internal hemmorhoids by colonoscopy in 04/2006, Bx neg for IBD  . Duodenitis     secondary to H pylori(treatment completed 05/2006), +/- NSAIDS  . Alcoholism (HCC)   . History of cocaine abuse   . PE 05/16/2009    CT angio positive for PE in 07/10, treated with coumadin for 8 months.   Marland Kitchen ETOH abuse   . Anxiety   . Depression   . Pulmonary embolism (HCC)     a. 04/2009 - treated with coumadin x 8 mos.  . DVT (deep venous thrombosis) (HCC)     a. 04/2009 - treated with coumadin x 8 mos.  . Rectal bleeding     a. int hemorrhoids by colonoscopy in 04/2006, Bx neg for IBD  . Duodenitis     a. 05/2006 2/2 H pylori +/- NSAIDS  . History of CVA (cerebrovascular accident)   . Tobacco abuse   . Chest pain     a. 04/2009 Echo: EF 55-60%, Gr1 DD, Mild MR.  Marland Kitchen Hypertension   . Heart murmur   . Stroke (HCC)   . Seizures (HCC)   . ANEMIA, VITAMIN B12 DEFICIENCY 11/11/2007    Documented low 175 on 09/2007; received 6 yrs of IM therapy; switched to po 05/2013   . Cardiac arrest (HCC) 01/16/2013    witnessed, shockable rhythm, initally low EF, no CAD at cath. Drug screen negative.    Medications: Current Outpatient Prescriptions on File Prior to Visit  Medication Sig Dispense Refill  . atorvastatin (LIPITOR) 40 MG tablet Take 1 tablet (40 mg  total) by mouth daily. 30 tablet 6  . carvedilol (COREG) 12.5 MG tablet Take 1 tablet (12.5 mg total) by mouth 2 (two) times daily. 60 tablet 3  . Cholecalciferol 1000 UNITS capsule Take 1 capsule (1,000 Units total) by mouth daily. 30 capsule 12  . furosemide (LASIX) 20 MG tablet Take 1 tablet (20 mg total) by mouth daily. As needed 30 tablet 2  . lisinopril (PRINIVIL,ZESTRIL) 5 MG tablet TAKE ONE TABLET BY MOUTH DAILY 30 tablet 0  . loratadine (CLARITIN) 10 MG tablet Take 10 mg by mouth daily as needed for allergies.     . nitroGLYCERIN (NITROSTAT) 0.4 MG SL tablet Place 1 tablet (0.4 mg total) under the tongue every 5 (five) minutes as needed for chest pain. 25 tablet 5  . potassium chloride (K-DUR) 10 MEQ tablet Take 1 tablet (10 mEq total) by mouth daily. 30 tablet 0  . vitamin B-12 (CYANOCOBALAMIN) 1000 MCG tablet Take 1,000 mcg by mouth daily.     No current facility-administered medications on file prior to visit.    Allergies: Allergies  Allergen Reactions  . Iodinated Diagnostic Agents Other (See Comments)    Seizure  . Omnipaque [Iohexol] Other (See Comments)  Seizure   . Omnipaque [Iohexol] Other (See Comments)    Seizure     FH: Family History  Problem Relation Age of Onset  . Coronary artery disease      1 st degree female relative <60 and female <50  . Diabetes type II      1 st degree relative  . Breast cancer Mother   . Heart disease Mother   . Heart disease Father   . Heart disease Brother   . Breast cancer      1st degree relative <50  . Diabetes Mother   . CAD Mother   . Colon cancer Father   . CAD Father   . Breast cancer Sister   . Diabetes Sister   . Diabetes Brother   . CAD Brother   . Heart attack Mother   . Hypertension Mother   . Hypertension Sister     SH: Social History   Social History  . Marital Status: Single    Spouse Name: N/A  . Number of Children: 2  . Years of Education: N/A   Occupational History  . Unemployed     Social History Main Topics  . Smoking status: Current Some Day Smoker -- 0.10 packs/day    Types: Cigarettes  . Smokeless tobacco: Never Used     Comment: smokes about 1-2 cigs/day  . Alcohol Use: Yes     Comment: beer  . Drug Use: Yes    Special: "Crack" cocaine, Cocaine     Comment: former  . Sexual Activity: Not on file   Other Topics Concern  . Not on file   Social History Narrative   ** Merged History Encounter **       ** Data from: 12/05/12 Enc Dept: [facility]   No cocaine or alcohol use since hospital discharge 05/19/2009.      10/13 update:  States she is smoking 1-2 cigarettes daily with at least 1 drink daily usually Brandy.                  ** Data from: 01/17/13 Enc Dept: [facility]   Pt lives in [place] with a roommate.          Review of Systems: Constitutional: Negative for fever, chills.  Eyes: Negative for blurred vision.  Respiratory: Negative for cough and shortness of breath.  Cardiovascular: Negative for chest pain.  Gastrointestinal: Negative for nausea, vomiting. Neurological: Negative for dizziness.   Objective:   Vital Signs: Filed Vitals:   02/21/16 1343  BP: 148/69  Pulse: 80  Temp: 98.1 F (36.7 C)  TempSrc: Oral  Height: 5\' 4"  (1.626 m)  Weight: 139 lb 8 oz (63.277 kg)  SpO2: 100%     BP Readings from Last 3 Encounters:  02/21/16 148/69  12/22/15 111/66  11/05/15 132/72    Physical Exam: Constitutional: Vital signs reviewed.  Patient is in NAD and cooperative with exam.  Head: Normocephalic and atraumatic. Eyes: EOMI, conjunctivae nl, no scleral icterus.  Neck: Supple. Cardiovascular: RRR, no MRG. Pulmonary/Chest: normal effort, CTAB, no wheezes, rales, or rhonchi. Abdominal: Soft. NT/ND +BS. Neurological: A&O x3, cranial nerves II-XII are grossly intact, moving all extremities. Extremities: No LE edema. Skin: Warm, dry and intact. No rash.   Assessment & Plan:   Assessment and plan was discussed  and formulated with my attending.

## 2016-02-22 ENCOUNTER — Encounter: Payer: Self-pay | Admitting: Internal Medicine

## 2016-02-23 NOTE — Progress Notes (Signed)
Case discussed with Dr. Gill soon after the resident saw the patient.  We reviewed the resident's history and exam and pertinent patient test results.  I agree with the assessment, diagnosis, and plan of care documented in the resident's note. 

## 2016-02-23 NOTE — Addendum Note (Signed)
Addended by: Doneen Poisson D on: 02/23/2016 01:43 PM   Modules accepted: Level of Service

## 2016-04-05 ENCOUNTER — Other Ambulatory Visit: Payer: Self-pay | Admitting: Internal Medicine

## 2016-04-05 DIAGNOSIS — Z1231 Encounter for screening mammogram for malignant neoplasm of breast: Secondary | ICD-10-CM

## 2016-04-06 ENCOUNTER — Ambulatory Visit
Admission: RE | Admit: 2016-04-06 | Discharge: 2016-04-06 | Disposition: A | Discharge status: Home / Self Care | Payer: No Typology Code available for payment source | Source: Ambulatory Visit | Attending: Cardiology | Admitting: Cardiology

## 2016-04-06 DIAGNOSIS — Z1231 Encounter for screening mammogram for malignant neoplasm of breast: Secondary | ICD-10-CM

## 2016-04-07 ENCOUNTER — Encounter: Payer: Self-pay | Admitting: *Deleted

## 2016-04-26 ENCOUNTER — Other Ambulatory Visit (HOSPITAL_COMMUNITY): Payer: Self-pay | Admitting: Cardiology

## 2016-05-22 ENCOUNTER — Ambulatory Visit (INDEPENDENT_AMBULATORY_CARE_PROVIDER_SITE_OTHER): Payer: Self-pay | Admitting: Internal Medicine

## 2016-05-22 DIAGNOSIS — M7062 Trochanteric bursitis, left hip: Secondary | ICD-10-CM

## 2016-05-22 DIAGNOSIS — M25552 Pain in left hip: Secondary | ICD-10-CM

## 2016-05-22 HISTORY — DX: Trochanteric bursitis, left hip: M70.62

## 2016-05-22 MED ORDER — MELOXICAM 7.5 MG PO TABS: 7.5000 mg | ORAL_TABLET | Freq: Every day | ORAL | 0 refills | Status: DC

## 2016-05-22 NOTE — Patient Instructions (Signed)
Please take meloxicam once daily until you run out of medication.   If things don't get better, please see [place] back.

## 2016-05-22 NOTE — Assessment & Plan Note (Addendum)
Assessment Sometime last week, she noted acute onset of left-sided hip pain which radiates down her thigh to the knee. Once of his pain was noted while she was walking and is worse with bending movements. Her pain is relieved when she props the knee on a pillow at night when she is laying supine. 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 She denies associated numbness, tingling, bowel/bladder dysfunction, recent trauma/injury, or any medication use to alleviate her pain.  The radiation of her pain and the point tenderness of her thigh raises concern for bursitis though she apparently also has pain with movement of the left knee joint which raises concern for joint arthritis. Either way, she could benefit from anti-inflammatory therapy.  Plan -Prescribed meloxicam 7.5 mg to be taken once daily 10 days -Consider steroid injection of the hip at follow-up if she is no better

## 2016-05-22 NOTE — Progress Notes (Signed)
Internal Medicine Clinic Attending  Case discussed with Dr. Patel,Rushil at the time of the visit.  We reviewed the resident's history and exam and pertinent patient test results.  I agree with the assessment, diagnosis, and plan of care documented in the resident's note.  

## 2016-05-22 NOTE — Progress Notes (Signed)
   CC: Left hip pain  HPI:  [name] is a 55 y.o. female who presents today for left hip pain. Please see assessment & plan for status of chronic medical problems.   Past Medical History:  Diagnosis Date  . Alcoholism (HCC)   . ANEMIA, VITAMIN B12 DEFICIENCY 11/11/2007   Documented low 175 on 09/2007; received 6 yrs of IM therapy; switched to po 05/2013   . Anxiety   . Cardiac arrest (HCC) 01/16/2013   witnessed, shockable rhythm, initally low EF, no CAD at cath. Drug screen negative.  . Chest pain    a. 04/2009 Echo: EF 55-60%, Gr1 DD, Mild MR.  . Depression   . Duodenitis    secondary to H pylori(treatment completed 05/2006), +/- NSAIDS  . Duodenitis    a. 05/2006 2/2 H pylori +/- NSAIDS  . DVT (deep venous thrombosis) (HCC)    a. 04/2009 - treated with coumadin x 8 mos.  Marland Kitchen ETOH abuse   . Heart murmur   . History of cocaine abuse   . History of CVA (cerebrovascular accident)   . Hypertension   . PE 05/16/2009   CT angio positive for PE in 07/10, treated with coumadin for 8 months.   . Pulmonary embolism (HCC)    a. 04/2009 - treated with coumadin x 8 mos.  . Rectal bleeding 2007   internal hemmorhoids by colonoscopy in 04/2006, Bx neg for IBD  . Rectal bleeding    a. int hemorrhoids by colonoscopy in 04/2006, Bx neg for IBD  . Seizures (HCC)   . Stroke (HCC)   . Tobacco abuse     Review of Systems:  Please see each problem below for a pertinent review of systems.   Physical Exam:  Vitals:   05/22/16 0933  BP: 131/67  Pulse: 69  Temp: 98.1 F (36.7 C)  TempSrc: Oral  SpO2: 100%  Weight: 133 lb 3.2 oz (60.4 kg)  Height: 5\' 4"  (1.626 m)   Constitutional: Middle-aged African-American female. No distress.  Musculoskeletal: Left knee without effusion or tenderness to palpation. No crepitus elicited with movement though pain elicited with flexion and extension of this joint as compared to the right. Point tenderness also noted of the left  thigh.    Assessment & Plan:   See encounters tab for problem based medical decision making.   Patient discussed with Dr. Cleda Daub

## 2016-05-29 ENCOUNTER — Other Ambulatory Visit: Payer: Self-pay

## 2016-05-29 DIAGNOSIS — I10 Essential (primary) hypertension: Secondary | ICD-10-CM

## 2016-05-29 NOTE — Telephone Encounter (Signed)
Requesting lisinopril, potassium and carvedilol to be filled @ [facility] on [address].

## 2016-05-30 MED ORDER — LISINOPRIL 5 MG PO TABS: 5.0000 mg | ORAL_TABLET | Freq: Every day | ORAL | 3 refills | Status: DC

## 2016-05-30 MED ORDER — POTASSIUM CHLORIDE ER 10 MEQ PO TBCR: 10.0000 meq | EXTENDED_RELEASE_TABLET | Freq: Every day | ORAL | 3 refills | Status: DC

## 2016-05-30 MED ORDER — CARVEDILOL 12.5 MG PO TABS: 12.5000 mg | ORAL_TABLET | Freq: Two times a day (BID) | ORAL | 3 refills | Status: DC

## 2016-07-04 ENCOUNTER — Telehealth: Payer: Self-pay | Admitting: Internal Medicine

## 2016-07-04 NOTE — Telephone Encounter (Signed)
AT. REMINDER CALL, NO ANSWER, NO VOICEMAIL °

## 2016-07-05 ENCOUNTER — Encounter: Payer: Self-pay | Admitting: Internal Medicine

## 2016-07-26 ENCOUNTER — Encounter: Payer: Self-pay | Admitting: Internal Medicine

## 2016-07-26 ENCOUNTER — Ambulatory Visit (INDEPENDENT_AMBULATORY_CARE_PROVIDER_SITE_OTHER): Payer: Self-pay | Admitting: Internal Medicine

## 2016-07-26 VITALS — BP 127/108 | HR 76 | Temp 98.0°F | Ht 64.0 in | Wt 129.7 lb

## 2016-07-26 DIAGNOSIS — Z0000 Encounter for general adult medical examination without abnormal findings: Secondary | ICD-10-CM

## 2016-07-26 DIAGNOSIS — R634 Abnormal weight loss: Secondary | ICD-10-CM

## 2016-07-26 DIAGNOSIS — I428 Other cardiomyopathies: Secondary | ICD-10-CM

## 2016-07-26 DIAGNOSIS — F17210 Nicotine dependence, cigarettes, uncomplicated: Secondary | ICD-10-CM

## 2016-07-26 DIAGNOSIS — Z23 Encounter for immunization: Secondary | ICD-10-CM

## 2016-07-26 DIAGNOSIS — D519 Vitamin B12 deficiency anemia, unspecified: Secondary | ICD-10-CM

## 2016-07-26 DIAGNOSIS — F1421 Cocaine dependence, in remission: Secondary | ICD-10-CM

## 2016-07-26 DIAGNOSIS — I447 Left bundle-branch block, unspecified: Secondary | ICD-10-CM

## 2016-07-26 DIAGNOSIS — F1020 Alcohol dependence, uncomplicated: Secondary | ICD-10-CM

## 2016-07-26 DIAGNOSIS — D518 Other vitamin B12 deficiency anemias: Secondary | ICD-10-CM

## 2016-07-26 DIAGNOSIS — G4700 Insomnia, unspecified: Secondary | ICD-10-CM | POA: Insufficient documentation

## 2016-07-26 DIAGNOSIS — F1910 Other psychoactive substance abuse, uncomplicated: Secondary | ICD-10-CM

## 2016-07-26 DIAGNOSIS — I10 Essential (primary) hypertension: Secondary | ICD-10-CM

## 2016-07-26 MED ORDER — ATORVASTATIN CALCIUM 40 MG PO TABS: 40.0000 mg | ORAL_TABLET | Freq: Every day | ORAL | 6 refills | Status: DC

## 2016-07-26 MED ORDER — VITAMIN B-12 1000 MCG PO TABS: 1000.0000 ug | ORAL_TABLET | Freq: Every day | ORAL | 2 refills | Status: DC

## 2016-07-26 MED ORDER — MELATONIN 10 MG PO TABS: 10.0000 mg | ORAL_TABLET | Freq: Every evening | ORAL | 2 refills | Status: DC | PRN

## 2016-07-26 MED ORDER — THIAMINE HCL 100 MG PO TABS: 100.0000 mg | ORAL_TABLET | Freq: Every day | ORAL | 6 refills | Status: DC

## 2016-07-26 MED ORDER — CHOLECALCIFEROL 25 MCG (1000 UT) PO CAPS: 1000.0000 [IU] | ORAL_CAPSULE | Freq: Every day | ORAL | 12 refills | Status: DC

## 2016-07-26 MED ORDER — POTASSIUM CHLORIDE ER 10 MEQ PO TBCR: 10.0000 meq | EXTENDED_RELEASE_TABLET | Freq: Every day | ORAL | 3 refills | Status: DC

## 2016-07-26 MED ORDER — LISINOPRIL 5 MG PO TABS: 5.0000 mg | ORAL_TABLET | Freq: Every day | ORAL | 3 refills | Status: DC

## 2016-07-26 MED ORDER — NITROGLYCERIN 0.4 MG SL SUBL: 0.4000 mg | SUBLINGUAL_TABLET | SUBLINGUAL | 5 refills | Status: DC | PRN

## 2016-07-26 MED ORDER — FUROSEMIDE 20 MG PO TABS: 20.0000 mg | ORAL_TABLET | Freq: Every day | ORAL | 2 refills | Status: DC

## 2016-07-26 MED ORDER — LORATADINE 10 MG PO TABS: 10.0000 mg | ORAL_TABLET | Freq: Every day | ORAL | 2 refills | Status: DC | PRN

## 2016-07-26 MED ORDER — CARVEDILOL 12.5 MG PO TABS
12.5000 mg | ORAL_TABLET | Freq: Two times a day (BID) | ORAL | 3 refills | Status: DC
Start: 2016-07-26 — End: 2017-07-03

## 2016-07-26 NOTE — Assessment & Plan Note (Signed)
No issues with chest pain, shortness of breath, or orthopnea. No concerning findings on exam today. Recalled recent episode where her feet swelled up after eating a large bag of peanuts. Likely high salt intake related.  Plan: - Advised to avoid high salt foods and minimize salt in her diet - Encouraged to use her prn Lasix prescription if you should develop this extremity edema again

## 2016-07-26 NOTE — Assessment & Plan Note (Signed)
Has lost approx 10 lbs in 4-5 months (140 to 130 lbs), has great appetite and eats often. Due for colonoscopy (last done 2007). Sent home with FOBT to screen for possible GI malignancy.  Plan: - Continue to monitor, if acquires insurance/orange card should acquire lung cancer screening CT

## 2016-07-26 NOTE — Assessment & Plan Note (Signed)
Reports not using cocaine since January 2017, only drinking 1 40oz beer per month now, approx 3 cigarretts daily. Has greatly improved her lifestyle by moving from her previous residence/environment. Now living across the street from her mother, albeit still in a concerning part of town. She states that she is "surrounded by crack houses" and she avoids these temptations by never leaving her house, and only does so to go to see her mother - who is a very positive influence and source of strength/support.   Plan: - Congratulated and encouraged to continue her current strategy, concern that such isolation may not be sustainable, encouraged to gradually find new friends with wholesome interests as well - Encouraged to continue to work toward smoking cessation

## 2016-07-26 NOTE — Assessment & Plan Note (Signed)
Endorsed mild insomnia. Provided Rx for Melatonin 10mg  QHS PRN. Advise on good sleep hygiene at next visit.

## 2016-07-26 NOTE — Progress Notes (Addendum)
   CC: Health maintenance / check up  HPI:  [name] is a 55 y.o. female with PMHx detailed below presenting for health maintenance visit with no acute complaints. She states that she has made some positive decisions, moving from her previous location and closer to her mother. This was to "move away from a bad environment and bad people." She states that overall she feels well, requires all her medications refilled, and only has issues with insomnia and tingling in her fingers/toes.  See problem based assessment and plan below for additional details.  Past Medical History:  Diagnosis Date  . Alcoholism (HCC)   . ANEMIA, VITAMIN B12 DEFICIENCY 11/11/2007   Documented low 175 on 09/2007; received 6 yrs of IM therapy; switched to po 05/2013   . Anxiety   . Cardiac arrest (HCC) 01/16/2013   witnessed, shockable rhythm, initally low EF, no CAD at cath. Drug screen negative.  . Chest pain    a. 04/2009 Echo: EF 55-60%, Gr1 DD, Mild MR.  . Depression   . Duodenitis    secondary to H pylori(treatment completed 05/2006), +/- NSAIDS  . Duodenitis    a. 05/2006 2/2 H pylori +/- NSAIDS  . DVT (deep venous thrombosis) (HCC)    a. 04/2009 - treated with coumadin x 8 mos.  Marland Kitchen ETOH abuse   . Heart murmur   . History of cocaine abuse   . History of CVA (cerebrovascular accident)   . Hypertension   . PE 05/16/2009   CT angio positive for PE in 07/10, treated with coumadin for 8 months.   . Pulmonary embolism (HCC)    a. 04/2009 - treated with coumadin x 8 mos.  . Rectal bleeding 2007   internal hemmorhoids by colonoscopy in 04/2006, Bx neg for IBD  . Rectal bleeding    a. int hemorrhoids by colonoscopy in 04/2006, Bx neg for IBD  . Seizures (HCC)   . Stroke (HCC)   . Tobacco abuse     Review of Systems: Review of Systems  Constitutional: Positive for malaise/fatigue and weight loss. Negative for chills and fever.  Respiratory: Positive for cough. Negative for shortness of breath.     Cardiovascular: Positive for chest pain and leg swelling. Negative for orthopnea.  Gastrointestinal: Negative for abdominal pain and blood in stool.  Genitourinary: Negative for dysuria and frequency.  Neurological: Positive for tingling.  Psychiatric/Behavioral: The patient has insomnia.      Physical Exam: Vitals:   07/26/16 1405  BP: (!) 127/108  Pulse: 76  Temp: 98 F (36.7 C)  TempSrc: Oral  SpO2: 100%  Weight: 129 lb 11.2 oz (58.8 kg)  Height: 5\' 4"  (1.626 m)   Body mass index is 22.26 kg/m. GENERAL- Pleasant woman sitting comfortably on exam table, alert, in no distress HEENT- Atraumatic, moist mucous membranes, poor dentition, no cervical lymphadenopathy CARDIAC- Regular rate and rhythm, no murmurs, rubs or gallops. RESP- Clear to ascultation bilaterally, no wheezing or crackles, normal work of breathing ABDOMEN- Normoactive bowel sounds, soft, nontender, nondistended BACK- Normal curvature, no spinal tenderness EXTREMITIES- Normal bulk and range of motion, no edema, 2+ peripheral pulses SKIN- Warm, dry, intact, without visible rash PSYCH- Appropriate affect, clear speech, thoughts linear and goal-directed  Assessment & Plan:   See encounters tab for problem based medical decision making.  Patient seen with Dr. Heide Spark

## 2016-07-26 NOTE — Assessment & Plan Note (Signed)
Received flu vaccine, collected pap smear for cytology, sent home with FOBT (no insurance so avoided GI referral/c-scope)

## 2016-07-26 NOTE — Assessment & Plan Note (Signed)
Elevated diastolic on visit today, BP 127/108 but has not been taking medications. Refilled all medications. Recheck at next visit, continue current antihypertensive regimen.

## 2016-07-26 NOTE — Patient Instructions (Addendum)
Please continue taking your medications as prescribed.  We will call if any of your lab results come back abnormal.  Continue to eat plenty of fruits and vegetables and drink water over soda and juice.  Also try and cut down on smoking and quit, and remember to be cautious about falling back into drinking/drug habits.  Thank you for visiting [place] today!

## 2016-07-26 NOTE — Assessment & Plan Note (Signed)
Anemic at previous visit with Hb 11.2, endorsing tingling in her fingers and toes and has a history of vitamin B12 deficiency, not history of DM or spinal issues.  Plan: Check CBC, CMP, B12

## 2016-07-27 LAB — CBC
Hematocrit: 33.3 % — ABNORMAL LOW (ref 34.0–46.6)
Hemoglobin: 10.9 g/dL — ABNORMAL LOW (ref 11.1–15.9)
MCH: 30.1 pg (ref 26.6–33.0)
MCHC: 32.7 g/dL (ref 31.5–35.7)
MCV: 92 fL (ref 79–97)
PLATELETS: 252 10*3/uL (ref 150–379)
RBC: 3.62 x10E6/uL — ABNORMAL LOW (ref 3.77–5.28)
RDW: 14.7 % (ref 12.3–15.4)
WBC: 9.4 10*3/uL (ref 3.4–10.8)

## 2016-07-27 LAB — CMP14 + ANION GAP
ALK PHOS: 62 IU/L (ref 39–117)
ALT: 44 IU/L — ABNORMAL HIGH (ref 0–32)
ANION GAP: 15 mmol/L (ref 10.0–18.0)
AST: 44 IU/L — AB (ref 0–40)
Albumin/Globulin Ratio: 1.7 (ref 1.2–2.2)
Albumin: 4.5 g/dL (ref 3.5–5.5)
BUN/Creatinine Ratio: 28 — ABNORMAL HIGH (ref 9–23)
BUN: 15 mg/dL (ref 6–24)
Bilirubin Total: <0.2 mg/dL (ref 0.0–1.2)
CO2: 23 mmol/L (ref 18–29)
CREATININE: 0.54 mg/dL — AB (ref 0.57–1.00)
Calcium: 10 mg/dL (ref 8.7–10.2)
Chloride: 103 mmol/L (ref 96–106)
GFR calc Af Amer: 124 mL/min/{1.73_m2} (ref >59)
GFR calc non Af Amer: 107 mL/min/{1.73_m2} (ref >59)
GLOBULIN, TOTAL: 2.7 g/dL (ref 1.5–4.5)
GLUCOSE: 80 mg/dL (ref 65–99)
POTASSIUM: 4.3 mmol/L (ref 3.5–5.2)
SODIUM: 141 mmol/L (ref 134–144)
Total Protein: 7.2 g/dL (ref 6.0–8.5)

## 2016-07-27 LAB — VITAMIN B12: VITAMIN B 12: 398 pg/mL (ref 211–946)

## 2016-07-27 NOTE — Progress Notes (Signed)
Internal Medicine Clinic Attending  I saw and evaluated the patient.  I personally confirmed the key portions of the history and exam documented by Dr. Johnson and I reviewed pertinent patient test results.  The assessment, diagnosis, and plan were formulated together and I agree with the documentation in the resident's note.  

## 2016-07-28 LAB — CYTOLOGY - PAP

## 2016-10-12 ENCOUNTER — Emergency Department (HOSPITAL_COMMUNITY): Payer: Self-pay

## 2016-10-12 ENCOUNTER — Emergency Department (HOSPITAL_COMMUNITY)
Admission: EM | Admit: 2016-10-12 | Discharge: 2016-10-12 | Disposition: A | Discharge status: Home / Self Care | Payer: Self-pay | Attending: Emergency Medicine | Admitting: Emergency Medicine

## 2016-10-12 ENCOUNTER — Encounter (HOSPITAL_COMMUNITY): Payer: Self-pay

## 2016-10-12 DIAGNOSIS — I10 Essential (primary) hypertension: Secondary | ICD-10-CM | POA: Insufficient documentation

## 2016-10-12 DIAGNOSIS — F17210 Nicotine dependence, cigarettes, uncomplicated: Secondary | ICD-10-CM | POA: Insufficient documentation

## 2016-10-12 DIAGNOSIS — Y929 Unspecified place or not applicable: Secondary | ICD-10-CM | POA: Insufficient documentation

## 2016-10-12 DIAGNOSIS — Y999 Unspecified external cause status: Secondary | ICD-10-CM | POA: Insufficient documentation

## 2016-10-12 DIAGNOSIS — Y93G3 Activity, cooking and baking: Secondary | ICD-10-CM | POA: Insufficient documentation

## 2016-10-12 DIAGNOSIS — R55 Syncope and collapse: Secondary | ICD-10-CM | POA: Insufficient documentation

## 2016-10-12 DIAGNOSIS — Z8673 Personal history of transient ischemic attack (TIA), and cerebral infarction without residual deficits: Secondary | ICD-10-CM | POA: Insufficient documentation

## 2016-10-12 DIAGNOSIS — M25521 Pain in right elbow: Secondary | ICD-10-CM | POA: Insufficient documentation

## 2016-10-12 DIAGNOSIS — W19XXXA Unspecified fall, initial encounter: Secondary | ICD-10-CM | POA: Insufficient documentation

## 2016-10-12 LAB — I-STAT TROPONIN, ED: Troponin i, poc: 0 ng/mL (ref 0.00–0.08)

## 2016-10-12 LAB — URINALYSIS, ROUTINE W REFLEX MICROSCOPIC
BACTERIA UA: NONE SEEN
BILIRUBIN URINE: NEGATIVE
Glucose, UA: NEGATIVE mg/dL
KETONES UR: NEGATIVE mg/dL
Nitrite: NEGATIVE
Protein, ur: 30 mg/dL — AB
Specific Gravity, Urine: 1.015 (ref 1.005–1.030)
pH: 5 (ref 5.0–8.0)

## 2016-10-12 LAB — CBC
HCT: 31.6 % — ABNORMAL LOW (ref 36.0–46.0)
HEMOGLOBIN: 10.6 g/dL — AB (ref 12.0–15.0)
MCH: 30.3 pg (ref 26.0–34.0)
MCHC: 33.5 g/dL (ref 30.0–36.0)
MCV: 90.3 fL (ref 78.0–100.0)
Platelets: 283 10*3/uL (ref 150–400)
RBC: 3.5 MIL/uL — AB (ref 3.87–5.11)
RDW: 14.4 % (ref 11.5–15.5)
WBC: 7.7 10*3/uL (ref 4.0–10.5)

## 2016-10-12 LAB — PROTIME-INR
INR: 1.18
Prothrombin Time: 15 seconds (ref 11.4–15.2)

## 2016-10-12 LAB — RPR: RPR Ser Ql: NONREACTIVE

## 2016-10-12 LAB — WET PREP, GENITAL
Sperm: NONE SEEN
Trich, Wet Prep: NONE SEEN
YEAST WET PREP: NONE SEEN

## 2016-10-12 LAB — BASIC METABOLIC PANEL
ANION GAP: 13 (ref 5–15)
BUN: 20 mg/dL (ref 6–20)
CHLORIDE: 108 mmol/L (ref 101–111)
CO2: 19 mmol/L — AB (ref 22–32)
CREATININE: 1.07 mg/dL — AB (ref 0.44–1.00)
Calcium: 8.6 mg/dL — ABNORMAL LOW (ref 8.9–10.3)
GFR calc non Af Amer: 58 mL/min — ABNORMAL LOW (ref >60)
Glucose, Bld: 94 mg/dL (ref 65–99)
Potassium: 3.6 mmol/L (ref 3.5–5.1)
Sodium: 140 mmol/L (ref 135–145)

## 2016-10-12 LAB — HEPATIC FUNCTION PANEL
ALT: 19 U/L (ref 14–54)
AST: 28 U/L (ref 15–41)
Albumin: 3.6 g/dL (ref 3.5–5.0)
Alkaline Phosphatase: 58 U/L (ref 38–126)
BILIRUBIN DIRECT: 0.2 mg/dL (ref 0.1–0.5)
BILIRUBIN TOTAL: 0.7 mg/dL (ref 0.3–1.2)
Indirect Bilirubin: 0.5 mg/dL (ref 0.3–0.9)
Total Protein: 6.6 g/dL (ref 6.5–8.1)

## 2016-10-12 LAB — I-STAT CG4 LACTIC ACID, ED
LACTIC ACID, VENOUS: 2.94 mmol/L — AB (ref 0.5–1.9)
LACTIC ACID, VENOUS: 2.14 mmol/L — AB (ref 0.5–1.9)

## 2016-10-12 LAB — CBG MONITORING, ED: GLUCOSE-CAPILLARY: 88 mg/dL (ref 65–99)

## 2016-10-12 LAB — D-DIMER, QUANTITATIVE: D-Dimer, Quant: 0.45 ug/mL-FEU (ref 0.00–0.50)

## 2016-10-12 LAB — HIV ANTIBODY (ROUTINE TESTING W REFLEX): HIV SCREEN 4TH GENERATION: NONREACTIVE

## 2016-10-12 MED ORDER — SODIUM CHLORIDE 0.9 % IV BOLUS (SEPSIS)
1000.0000 mL | Freq: Once | INTRAVENOUS | Status: AC
  Administered 2016-10-12: 1000 mL via INTRAVENOUS

## 2016-10-12 NOTE — ED Notes (Signed)
Pt reports last drink was at about 1700. She states she last smoked cocaine at 1700.  Also pt reports dark stool yesterday. Hx of rectal bleeding

## 2016-10-12 NOTE — ED Notes (Signed)
Called lab to add on INR, D-dimer,  And HFP

## 2016-10-12 NOTE — Discharge Instructions (Signed)
Do not take your high blood pressure medication until you are cleared by her primary care physician.  Please make an appointment with your primary care physician to be seen in the next 2-5 days.  Push fluids and drink plenty of water.  Do not hesitate to return to the emergency department for any new, worsening or concerning symptoms.

## 2016-10-12 NOTE — ED Notes (Signed)
Pt taken to xray 

## 2016-10-12 NOTE — ED Triage Notes (Signed)
Pt felt dizzy this morning in kitchen, had syncopal episode. Pt complaining of of R elbow pain. On EMS arrival pt hypotensive 86/55. #20rh- 250cc NS. Pt A&O x 4.

## 2016-10-12 NOTE — ED Notes (Signed)
Pelvic Cart set up at the bedside

## 2016-10-12 NOTE — ED Provider Notes (Signed)
[facility]-EMERGENCY DEPT Provider Note   CSN: [id number] Arrival date & time: 10/12/16  0534     History   Chief Complaint Chief Complaint  Patient presents with  . Loss of Consciousness     HPI  Blood pressure 100/76, pulse 75, temperature 97.6 F (36.4 C), temperature source Oral, resp. rate 20, height 5\' 4"  (1.626 m), weight 60.3 kg, last menstrual period 11/02/2010, SpO2 98 %.  [name] is a 55 y.o. female with past medical history significant for alcoholism, crack cocaine abuse, DVT/PE complaining of syncope while she was cooking, prodrome of feeling nauseous and lightheaded. States that she had an episode of chest pain yesterday but that was not associated with the syncope. States it was fleeting lasting only a few seconds. She been in her normal state of health, she smoked crack cocaine yesterday. She's been eating and drinking normally no nausea, vomiting, abdominal pain, fever, chills, change in bowel or bladder habits. She states she had an episode of dark stool. She's never been told she has any liver dysfunction, does not take excessive NSAIDs. Has no history of GI bleed. She denies any active chest pain, palpitations, calf pain, leg swelling, shortness of breath or cough. He is been compliant with her medications, has not taken her blood pressure medication a week because she ran out.  Past Medical History:  Diagnosis Date  . Alcoholism (HCC)   . ANEMIA, VITAMIN B12 DEFICIENCY 11/11/2007   Documented low 175 on 09/2007; received 6 yrs of IM therapy; switched to po 05/2013   . Anxiety   . Cardiac arrest (HCC) 01/16/2013   witnessed, shockable rhythm, initally low EF, no CAD at cath. Drug screen negative.  . Chest pain    a. 04/2009 Echo: EF 55-60%, Gr1 DD, Mild MR.  . Depression   . Duodenitis    secondary to H pylori(treatment completed 05/2006), +/- NSAIDS  . Duodenitis    a. 05/2006 2/2 H pylori +/- NSAIDS  . DVT (deep venous thrombosis) (HCC)    a. 04/2009 -  treated with coumadin x 8 mos.  Marland Kitchen ETOH abuse   . Heart murmur   . History of cocaine abuse   . History of CVA (cerebrovascular accident)   . Hypertension   . PE 05/16/2009   CT angio positive for PE in 07/10, treated with coumadin for 8 months.   . Pulmonary embolism (HCC)    a. 04/2009 - treated with coumadin x 8 mos.  . Rectal bleeding 2007   internal hemmorhoids by colonoscopy in 04/2006, Bx neg for IBD  . Rectal bleeding    a. int hemorrhoids by colonoscopy in 04/2006, Bx neg for IBD  . Seizures (HCC)   . Stroke (HCC)   . Tobacco abuse     Patient Active Problem List   Diagnosis Date Noted  . Unintentional weight loss 07/26/2016  . Insomnia 07/26/2016  . Trochanteric bursitis of left hip 05/22/2016  . H/O chest pain 06/08/2014  . Right knee pain 01/26/2014  . Unspecified vitamin D deficiency 09/23/2013  . Menopausal syndrome (mainly irritability and hot flashes)  06/02/2013  . H/O seasonal allergies 02/28/2013  . GERD (gastroesophageal reflux disease) 02/28/2013  . Nonischemic cardiomyopathy, LBBB (likely d/t ETOH abuse)  01/19/2013  . History of cardiac arrest 01/16/2013  . Hypertension 05/05/2011  . Health care maintenance 01/02/2011  . ANEMIA, VITAMIN B12 DEFICIENCY 11/11/2007  . Polysubstance abuse (with elevated transaminases)  05/09/2007    Past Surgical History:  Procedure Laterality  Date  . CESAREAN SECTION    . LEFT HEART CATHETERIZATION WITH CORONARY ANGIOGRAM N/A 06/23/2014   Procedure: LEFT HEART CATHETERIZATION WITH CORONARY ANGIOGRAM;  Surgeon: Laurey Morale, MD; No CAD    OB History    No data available       Home Medications    Prior to Admission medications   Medication Sig Start Date End Date Taking? Authorizing Provider  atorvastatin (LIPITOR) 40 MG tablet Take 1 tablet (40 mg total) by mouth daily. 07/26/16  Yes Althia Forts, MD  carvedilol (COREG) 12.5 MG tablet Take 1 tablet (12.5 mg total) by mouth 2 (two) times daily. 07/26/16  Yes Althia Forts, MD  Cholecalciferol 1000 units capsule Take 1 capsule (1,000 Units total) by mouth daily. 07/26/16  Yes Althia Forts, MD  furosemide (LASIX) 20 MG tablet Take 1 tablet (20 mg total) by mouth daily. As needed Patient taking differently: Take 20 mg by mouth daily as needed for fluid. As needed 07/26/16  Yes Althia Forts, MD  lisinopril (PRINIVIL,ZESTRIL) 5 MG tablet Take 1 tablet (5 mg total) by mouth daily. 07/26/16  Yes Althia Forts, MD  loratadine (CLARITIN) 10 MG tablet Take 1 tablet (10 mg total) by mouth daily as needed for allergies. 07/26/16  Yes Althia Forts, MD  nitroGLYCERIN (NITROSTAT) 0.4 MG SL tablet Place 1 tablet (0.4 mg total) under the tongue every 5 (five) minutes as needed for chest pain. 07/26/16  Yes Althia Forts, MD  thiamine 100 MG tablet Take 1 tablet (100 mg total) by mouth daily. 07/26/16  Yes Althia Forts, MD  vitamin B-12 (CYANOCOBALAMIN) 1000 MCG tablet Take 1 tablet (1,000 mcg total) by mouth daily. 07/26/16  Yes Althia Forts, MD  Melatonin 10 MG TABS Take 10 mg by mouth at bedtime as needed. Patient not taking: Reported on 10/12/2016 07/26/16   Althia Forts, MD  potassium chloride (K-DUR) 10 MEQ tablet Take 1 tablet (10 mEq total) by mouth daily. Patient not taking: Reported on 10/12/2016 07/26/16   Althia Forts, MD    Family History Family History  Problem Relation Age of Onset  . Coronary artery disease      1 st degree female relative <60 and female <50  . Diabetes type II      1 st degree relative  . Breast cancer      1st degree relative <50  . Breast cancer Mother   . Heart disease Mother   . Diabetes Mother   . CAD Mother   . Heart attack Mother   . Hypertension Mother   . Heart disease Father   . Colon cancer Father   . CAD Father   . Heart disease Brother   . Diabetes Brother   . Breast cancer Sister   . Diabetes Sister   . CAD Brother   . Hypertension Sister     Social History Social History  Substance Use Topics  . Smoking status:  Current Some Day Smoker    Packs/day: 0.10    Types: Cigarettes  . Smokeless tobacco: Never Used     Comment: smokes about 1-2 cigs/day  . Alcohol use Yes     Comment: beer     Allergies   Iodinated diagnostic agents and Omnipaque [iohexol]   Review of Systems Review of Systems  10 systems reviewed and found to be negative, except as noted in the HPI.   Physical Exam Updated Vital Signs BP 117/90 (BP Location: Right Arm)   Pulse 80  Temp 98.4 F (36.9 C) (Oral)   Resp 22   Ht 5\' 4"  (1.626 m)   Wt 60.3 kg   LMP 11/02/2010   SpO2 100%   BMI 22.83 kg/m   Physical Exam  Constitutional: She is oriented to person, place, and time. She appears well-developed and well-nourished. No distress.  Looks older than stated age  HENT:  Head: Normocephalic and atraumatic.  Mouth/Throat: Oropharynx is clear and moist.  No conjunctival pallor.  No abrasions or contusions.   No hemotympanum, battle signs or raccoon's eyes  No crepitance or tenderness to palpation along the orbital rim.  EOMI intact with no pain or diplopia  No abnormal otorrhea or rhinorrhea. Nasal septum midline.  No intraoral trauma.     Eyes: Conjunctivae and EOM are normal. Pupils are equal, round, and reactive to light.  Neck: Normal range of motion.  No midline C-spine  tenderness to palpation or step-offs appreciated. Patient has full range of motion without pain.  Grip strength, biceps, triceps 5/5 bilaterally;  can differentiate between pinprick and light touch bilaterally.   Cardiovascular: Normal rate, regular rhythm and intact distal pulses.  Exam reveals no friction rub.   No murmur heard. Pulmonary/Chest: Effort normal and breath sounds normal. No respiratory distress. She has no wheezes. She has no rales. She exhibits no tenderness.  Abdominal: Soft. There is no tenderness.  Genitourinary:  Genitourinary Comments: Digital rectal exam a chaperoned by technician: No rashes or lesions,  slightly reduced rectal tone, no stool in the rectal vault.  Pelvic exam a chaperoned by nurse Dahlia ClientHannah: No rashes or lesions, no abnormal vaginal discharge, no cervical motion or adnexal tenderness.  Musculoskeletal: Normal range of motion.  Neurological: She is alert and oriented to person, place, and time.  Strength 5 out of 5, goal-directed speech. No asterixis   Skin: She is not diaphoretic.  Psychiatric: She has a normal mood and affect.  Nursing note and vitals reviewed.    ED Treatments / Results  Labs (all labs ordered are listed, but only abnormal results are displayed) Labs Reviewed  WET PREP, GENITAL - Abnormal; Notable for the following:       Result Value   Clue Cells Wet Prep HPF POC PRESENT (*)    WBC, Wet Prep HPF POC MANY (*)    All other components within normal limits  BASIC METABOLIC PANEL - Abnormal; Notable for the following:    CO2 19 (*)    Creatinine, Ser 1.07 (*)    Calcium 8.6 (*)    GFR calc non Af Amer 58 (*)    All other components within normal limits  CBC - Abnormal; Notable for the following:    RBC 3.50 (*)    Hemoglobin 10.6 (*)    HCT 31.6 (*)    All other components within normal limits  URINALYSIS, ROUTINE W REFLEX MICROSCOPIC - Abnormal; Notable for the following:    APPearance HAZY (*)    Hgb urine dipstick SMALL (*)    Protein, ur 30 (*)    Leukocytes, UA LARGE (*)    Squamous Epithelial / LPF 0-5 (*)    All other components within normal limits  I-STAT CG4 LACTIC ACID, ED - Abnormal; Notable for the following:    Lactic Acid, Venous 2.94 (*)    All other components within normal limits  I-STAT CG4 LACTIC ACID, ED - Abnormal; Notable for the following:    Lactic Acid, Venous 2.14 (*)    All other components  within normal limits  D-DIMER, QUANTITATIVE (NOT AT [facility])  HEPATIC FUNCTION PANEL  PROTIME-INR  OCCULT BLOOD X 1 CARD TO LAB, STOOL  RPR  HIV ANTIBODY (ROUTINE TESTING)  CBG MONITORING, ED  I-STAT TROPOININ, ED    GC/CHLAMYDIA PROBE AMP ([place]) NOT AT [facility]    EKG  EKG Interpretation  Date/Time:  Thursday October 12 2016 05:34:27 EST Ventricular Rate:  71 PR Interval:    QRS Duration: 135 QT Interval:  533 QTC Calculation: 580 R Axis:   53 Text Interpretation:  Sinus rhythm Left atrial enlargement IVCD, consider atypical LBBB since last tracing no significant change Confirmed by Effie Shy  MD, ELLIOTT [id number]) on 10/12/2016 6:32:10 AM       Radiology Dg Chest 2 View  Result Date: 10/12/2016 CLINICAL DATA:  Syncope. EXAM: CHEST  2 VIEW COMPARISON:  Radiograph of December 22, 2015. FINDINGS: The heart size and mediastinal contours are within normal limits. Both lungs are clear. Atherosclerosis of thoracic aorta is noted. No pneumothorax or pleural effusion is noted. The visualized skeletal structures are unremarkable. IMPRESSION: No active cardiopulmonary disease.  Aortic atherosclerosis. Electronically Signed   By: Lupita Raider, M.D.   On: 10/12/2016 07:17   Dg Elbow Complete Right  Result Date: 10/12/2016 CLINICAL DATA:  Right elbow pain after fall. EXAM: RIGHT ELBOW - COMPLETE 3+ VIEW COMPARISON:  None. FINDINGS: There is no evidence of fracture, dislocation, or joint effusion. There is no evidence of arthropathy or other focal bone abnormality. Soft tissues are unremarkable. IMPRESSION: Normal right elbow. Electronically Signed   By: Lupita Raider, M.D.   On: 10/12/2016 07:15   Ct Head Wo Contrast  Result Date: 10/12/2016 CLINICAL DATA:  Acute onset of dizziness this morning. Syncopal episode. Fell backwards, striking head on the floor. EXAM: CT HEAD WITHOUT CONTRAST TECHNIQUE: Contiguous axial images were obtained from the base of the skull through the vertex without intravenous contrast. COMPARISON:  09/30/2009 FINDINGS: Brain: There is an old infarction in the right MCA territory affecting the parietal lobe with atrophy encephalomalacia in that region. No sign of acute infarction,  mass lesion, hemorrhage, hydrocephalus or extra-axial collection. Vascular: There is atherosclerotic calcification of the major vessels at the base of the brain. Skull: No skull fracture. Sinuses/Orbits: Clear/normal Other: None significant IMPRESSION: No acute finding. Old right MCA territory infarction. No visible change since 2010. Electronically Signed   By: Paulina Fusi M.D.   On: 10/12/2016 07:28    Procedures Procedures (including critical care time)  Medications Ordered in ED Medications  sodium chloride 0.9 % bolus 1,000 mL (0 mLs Intravenous Stopped 10/12/16 1029)  sodium chloride 0.9 % bolus 1,000 mL (1,000 mLs Intravenous New Bag/Given 10/12/16 1030)     Initial Impression / Assessment and Plan / ED Course  I have reviewed the triage vital signs and the nursing notes.  Pertinent labs & imaging results that were available during my care of the patient were reviewed by me and considered in my medical decision making (see chart for details).  Clinical Course    Vitals:   10/12/16 0930 10/12/16 1000 10/12/16 1030 10/12/16 1131  BP: 102/72 113/70 117/70 117/90  Pulse: 79 77 76 80  Resp: 16 16 21 22   Temp:    98.4 F (36.9 C)  TempSrc:    Oral  SpO2: 100% 100% 100% 100%  Weight:      Height:        Medications  sodium chloride 0.9 % bolus 1,000 mL (  0 mLs Intravenous Stopped 10/12/16 1029)  sodium chloride 0.9 % bolus 1,000 mL (1,000 mLs Intravenous New Bag/Given 10/12/16 1030)    [name] is 55 y.o. female presenting with Syncopal event while cooking dinner, she felt lightheaded beforehand. No associated chest pain 9 with the syncope. She does have a history of hypertension is been out of her blood pressure medications for greater than one week. She reports a darker than normal stool, I will try to obtain a stool sample however there is no stool in the rectal vault, will retry later time.  EKG with nonspecific IVCD, unchanged from prior. Blood work reassuring  with no anemia, her lactic acid is mildly elevated at 2.9. Bicarbonate is low at 19. Urinalysis with large leukocytes, no bacteria and 6-30 whites, this is a clean specimen with no squamous epithelial cells.  10:15 AM: Patient reevaluated her blood pressure remains strong with a systolic of 113 after 2 L she states that she feels significantly improved, the static vital signs are not consistent with hypotension however patient is symptomatic with a mild lightheadedness when she walks however she is ambulated in the ED with a steady gait back and forth to the bathroom. Advised her that she needs to DC all of her blood pressure medications for at least 1 week or until she is cleared by her primary care physician who is at the internal medicine clinic.  She is still not able to produce a stool sample to test for occult blood however she has no significant anemia, I doubt this is a bleed. She has a normal BUN level. No significant anemia with hemoglobin of 10.3 which is typical for her baseline.  Discussed case with attending physician who agrees with care plan and disposition.   Evaluation does not show pathology that would require ongoing emergent intervention or inpatient treatment. Pt is hemodynamically stable and mentating appropriately. Discussed findings and plan with patient/guardian, who agrees with care plan. All questions answered. Return precautions discussed and outpatient follow up given.    Final Clinical Impressions(s) / ED Diagnoses   Final diagnoses:  Syncope and collapse    New Prescriptions New Prescriptions   No medications on file     Wynetta Emery, PA-C 10/12/16 1138    Lyndal Pulley, MD 10/13/16 1245

## 2016-10-12 NOTE — ED Notes (Signed)
Occult Card if patient has bowel movement

## 2016-10-13 LAB — GC/CHLAMYDIA PROBE AMP (~~LOC~~) NOT AT ARMC
CHLAMYDIA, DNA PROBE: NEGATIVE
NEISSERIA GONORRHEA: NEGATIVE

## 2016-10-18 ENCOUNTER — Encounter: Payer: Self-pay | Admitting: Internal Medicine

## 2016-10-18 ENCOUNTER — Ambulatory Visit (INDEPENDENT_AMBULATORY_CARE_PROVIDER_SITE_OTHER): Payer: Self-pay | Admitting: Internal Medicine

## 2016-10-18 VITALS — BP 118/70 | HR 73 | Temp 97.9°F | Ht 64.0 in | Wt 138.5 lb

## 2016-10-18 DIAGNOSIS — F1910 Other psychoactive substance abuse, uncomplicated: Secondary | ICD-10-CM

## 2016-10-18 DIAGNOSIS — R195 Other fecal abnormalities: Secondary | ICD-10-CM

## 2016-10-18 DIAGNOSIS — F17210 Nicotine dependence, cigarettes, uncomplicated: Secondary | ICD-10-CM

## 2016-10-18 DIAGNOSIS — I10 Essential (primary) hypertension: Secondary | ICD-10-CM

## 2016-10-18 DIAGNOSIS — Z79899 Other long term (current) drug therapy: Secondary | ICD-10-CM

## 2016-10-18 DIAGNOSIS — I739 Peripheral vascular disease, unspecified: Secondary | ICD-10-CM | POA: Insufficient documentation

## 2016-10-18 DIAGNOSIS — F1410 Cocaine abuse, uncomplicated: Secondary | ICD-10-CM

## 2016-10-18 NOTE — Assessment & Plan Note (Addendum)
BP 118/70 today despite Lisinopril being discontinued 1 week ago. Recent ED visit for syncopal episode at home that responded to IV fluids, orthostatic symptoms at that time, negative workup, and was advised to hold her BP medication and has had no syncopal episodes or dizzy spells since then. Syncopal episode likely due to over-treated hypertension.  Plan: - Discontinued Lisinopril  - Encouraged to remain well-hydrated

## 2016-10-18 NOTE — Progress Notes (Signed)
   CC: ED follow up for syncope  HPI:  [name] is a 55 y.o. female with PMHx detailed below presenting for follow up of recent ED visit for syncope. She responded to IV fluids and labs, EKG, imaging workup were unremarkable. She has held her Lisinopril since then and experience no recurrence of syncope or dizziness since then. Her only complaint today is persistent aching pain in her leg muscles that develops after a few minutes of walking that has been bothering her for months.   See problem based assessment and plan below for additional details.  Past Medical History:  Diagnosis Date  . Alcoholism (HCC)   . ANEMIA, VITAMIN B12 DEFICIENCY 11/11/2007   Documented low 175 on 09/2007; received 6 yrs of IM therapy; switched to po 05/2013   . Anxiety   . Cardiac arrest (HCC) 01/16/2013   witnessed, shockable rhythm, initally low EF, no CAD at cath. Drug screen negative.  . Chest pain    a. 04/2009 Echo: EF 55-60%, Gr1 DD, Mild MR.  . Depression   . Duodenitis    secondary to H pylori(treatment completed 05/2006), +/- NSAIDS  . Duodenitis    a. 05/2006 2/2 H pylori +/- NSAIDS  . DVT (deep venous thrombosis) (HCC)    a. 04/2009 - treated with coumadin x 8 mos.  Marland Kitchen ETOH abuse   . Heart murmur   . History of cocaine abuse   . History of CVA (cerebrovascular accident)   . Hypertension   . PE 05/16/2009   CT angio positive for PE in 07/10, treated with coumadin for 8 months.   . Pulmonary embolism (HCC)    a. 04/2009 - treated with coumadin x 8 mos.  . Rectal bleeding 2007   internal hemmorhoids by colonoscopy in 04/2006, Bx neg for IBD  . Rectal bleeding    a. int hemorrhoids by colonoscopy in 04/2006, Bx neg for IBD  . Seizures (HCC)   . Stroke (HCC)   . Tobacco abuse     Review of Systems: Review of Systems  Constitutional: Negative for chills, fever, malaise/fatigue and weight loss.  Respiratory: Negative for cough, sputum production and shortness of breath.     Cardiovascular: Positive for claudication. Negative for chest pain, palpitations, orthopnea and leg swelling.  Gastrointestinal: Negative for abdominal pain, constipation, diarrhea, nausea and vomiting.  Musculoskeletal: Negative for back pain and joint pain.  Neurological: Negative for weakness.  All other systems reviewed and are negative.    Physical Exam: Vitals:   10/18/16 0839  BP: 118/70  Pulse: 73  Temp: 97.9 F (36.6 C)  TempSrc: Oral  SpO2: 100%  Weight: 138 lb 8 oz (62.8 kg)  Height: 5\' 4"  (1.626 m)   Body mass index is 23.77 kg/m. GENERAL- Woman sitting comfortably in exam room chair, alert, in no distress, appears older than stated age HEENT- Atraumatic, PERRL, moist mucous membranes CARDIAC- Regular rate and rhythm, no murmurs, rubs or gallops. RESP- Clear to ascultation bilaterally, no wheezing or crackles, normal work of breathing ABDOMEN- Soft, nontender, nondistended EXTREMITIES- Normal bulk and range of motion, no edema, 1+ peripheral pulses (DP, PT), warm and well-perfused feet and toes, knees and leg muscles non-tender to palpation SKIN- Warm, dry, intact, without visible rash PSYCH- Appropriate affect, clear speech, thoughts linear and goal-directed  Assessment & Plan:   See encounters tab for problem based medical decision making.  Patient seen with Dr. Heide Spark

## 2016-10-18 NOTE — Assessment & Plan Note (Signed)
Reports aching pain in her legs (R > L) that occurs in her thighs and calf muscles after 2-3 minutes of walking. Her symptoms improve with rest. This leg discomfort has been present for the past few months. She has 1+ DP and PT pulses and warm and well-perfused feet and toes on exam.  Plan: - Advised to quit smoking and to continued ongoing activity and regular exercise - Defer ABI testing for the moment given uninsured status and patient desire to minimize costs

## 2016-10-18 NOTE — Assessment & Plan Note (Signed)
Reports history of dark stools, Hb stable 10.5 to 11 over the past year, MCV 90. She failed to send in stool cards that we sent home with her last time - requesting another set of cards.  Plan: - Sent home with 3x stool cards for FOBT testing

## 2016-10-18 NOTE — Assessment & Plan Note (Signed)
Reports she has resumed smoking a few cigarettes daily, also smoked crack cocaine once in early December. Attributes this to visit and pressure from her "bad" friends and poor decision-making on her part. Discussed the need to stop any further drug use of this nature, as cocaine can cause significant cardiac damage, especially in patients such as herself with pre-existing heart disease. She voiced understanding and resolved to avoid these friends in the future.

## 2016-10-18 NOTE — Patient Instructions (Addendum)
We have stopped your blood pressure medication - Lisinopril.  Please stop smoking and continue to exercise and stay active, as this will help your leg muscles adapt and stay strong.   We have sent you home with stool cards to test for blood in your bowel movements. Please complete them and return them to our lab.  Take care an happy holidays!

## 2016-10-19 NOTE — Progress Notes (Signed)
Internal Medicine Clinic Attending  I saw and evaluated the patient.  I personally confirmed the key portions of the history and exam documented by Dr. Johnson and I reviewed pertinent patient test results.  The assessment, diagnosis, and plan were formulated together and I agree with the documentation in the resident's note.  

## 2017-04-09 ENCOUNTER — Encounter: Payer: Self-pay | Admitting: *Deleted

## 2017-06-05 ENCOUNTER — Ambulatory Visit: Payer: Self-pay

## 2017-06-11 ENCOUNTER — Ambulatory Visit (INDEPENDENT_AMBULATORY_CARE_PROVIDER_SITE_OTHER): Payer: Self-pay | Admitting: Internal Medicine

## 2017-06-11 VITALS — BP 120/62 | HR 77 | Temp 97.8°F | Wt 138.9 lb

## 2017-06-11 DIAGNOSIS — Z8669 Personal history of other diseases of the nervous system and sense organs: Secondary | ICD-10-CM

## 2017-06-11 DIAGNOSIS — M79602 Pain in left arm: Secondary | ICD-10-CM

## 2017-06-11 HISTORY — DX: Pain in left arm: M79.602

## 2017-06-11 MED ORDER — GABAPENTIN 300 MG PO CAPS: 300.0000 mg | ORAL_CAPSULE | Freq: Two times a day (BID) | ORAL | 2 refills | Status: DC

## 2017-06-11 MED FILL — GABAPENTIN 300 MG CAPS: 300 | 30 days supply | Qty: 60 | Fill #0

## 2017-06-11 NOTE — Progress Notes (Signed)
   CC: L arm pain and L hand numbness   HPI:  [name] is a 56 y.o. female with PMH as described below who presents to clinic with worsening L arm pain and new L hand numbness. Please see problem-based assessment and plan for further details.   Past Medical History:  Diagnosis Date  . Alcoholism (HCC)   . ANEMIA, VITAMIN B12 DEFICIENCY 11/11/2007   Documented low 175 on 09/2007; received 6 yrs of IM therapy; switched to po 05/2013   . Anxiety   . Cardiac arrest (HCC) 01/16/2013   witnessed, shockable rhythm, initally low EF, no CAD at cath. Drug screen negative.  . Chest pain    a. 04/2009 Echo: EF 55-60%, Gr1 DD, Mild MR.  . Depression   . Duodenitis    secondary to H pylori(treatment completed 05/2006), +/- NSAIDS  . Duodenitis    a. 05/2006 2/2 H pylori +/- NSAIDS  . DVT (deep venous thrombosis) (HCC)    a. 04/2009 - treated with coumadin x 8 mos.  Marland Kitchen ETOH abuse   . Heart murmur   . History of cocaine abuse   . History of CVA (cerebrovascular accident)   . Hypertension   . PE 05/16/2009   CT angio positive for PE in 07/10, treated with coumadin for 8 months.   . Pulmonary embolism (HCC)    a. 04/2009 - treated with coumadin x 8 mos.  . Rectal bleeding 2007   internal hemmorhoids by colonoscopy in 04/2006, Bx neg for IBD  . Rectal bleeding    a. int hemorrhoids by colonoscopy in 04/2006, Bx neg for IBD  . Seizures (HCC)   . Stroke (HCC)   . Tobacco abuse    Review of Systems:   Review of Systems  Constitutional: Negative for chills and fever.  Respiratory: Negative for cough and shortness of breath.   Cardiovascular: Negative for chest pain and palpitations.  Musculoskeletal: Positive for joint pain. Negative for back pain, myalgias and neck pain.  Neurological: Negative for tingling, tremors, sensory change and focal weakness.    Physical Exam:  Vitals:   06/11/17 1030  BP: 120/62  Pulse: 77  Temp: 97.8 F (36.6 C)  TempSrc: Oral  SpO2: 100%  Weight:  138 lb 14.4 oz (63 kg)   General: pleasant female, well-nourished, well-developed, in no acute distress  HENT: NCAT, neck supple and FROM, MMM, OP clear without exudates or erythema  Cardiac: regular rate and rhythm, nl S1/S2, II/VI systolic murmur best heard over L sternal border  Pulm: CTAB, no wheezes or crackles, no increased work of breathing  Neuro: A&Ox3, full passive and active ROM on both UE, 5/5 strength and intact sensation on UE bilaterally   Ext: warm and well perfused, no peripheral edema    Assessment & Plan:   See Encounters Tab for problem based charting.  Patient seen with Dr. Rogelia Boga

## 2017-06-11 NOTE — Patient Instructions (Signed)
It was a pleasure to meet you today, Ms. Berhow.   Please start taking gabapentin 1 tablet in the morning and 1 tablet at night.   I have sent the prescription to the [facility]. Please let me know if you have any issues getting the prescription.   Call the Internal Medicine Clinic if your symptoms worsened.

## 2017-06-11 NOTE — Assessment & Plan Note (Signed)
HPI: Patient presents for L arm pain. States this is a chronic issue that has worsened in the past 2-3 weeks. She describes the pain as intermittent (lasts 30-40s) and dull, and states she feels it in her bones. The pain travels to her neck, but is not associated with chest pain, jaw pain, N/V, or diaphoresis. It is associated with new L hand numbness located in the fingertips of 2nd-5th digits that started 2-3 weeks ago. She also reports difficulty moving her hand during these episodes. Per patient, she lost use of her L arm "a long time ago", though not clear as to why and I am unable to confirm this in her chart. She has been unable to identify any triggers and has been taking OTC Ibuprofen 3-4x/day with complete resolution of pain. Denies recent trauma to L arm. Has full ROM, full strength and intact sensation of UE bilaterally on exam.    A: Suspect this could be secondary to cervical radiculopathy given unilateral arm pain that travels to neck associated with transient neurological deficits. Symptoms are currently well controlled with Ibuprofen PRN. However, patient would like to try a different oral medication given Ibuprofen side effects. She has a hx of Vit B12 deficiency, but last B12 nl and this would present with bilateral deficits. She has no hx of DM, unlikely 2/2 peripheral neuropathy. Low suspicion for carpal tunnel syndrome as transient sensation deficits not in median nerve distribution. She also have a hx a seizure, though these seem to be associated to alcohol withdrawal, very low suspicion for focal seizure.   P:  - Start gabapentin 300mg  BID  - No indication for imaging at this time.  - Instructed to call [facility] clinic if symptoms worsen

## 2017-06-13 NOTE — Progress Notes (Signed)
Internal Medicine Clinic Attending  I saw and evaluated the patient.  I personally confirmed the key portions of the history and exam documented by Dr. Santos-Sancheza and I reviewed pertinent patient test results.  The assessment, diagnosis, and plan were formulated together and I agree with the documentation in the resident's note. 

## 2017-07-01 ENCOUNTER — Emergency Department (HOSPITAL_COMMUNITY): Payer: Self-pay

## 2017-07-01 ENCOUNTER — Emergency Department (HOSPITAL_COMMUNITY)
Admission: EM | Admit: 2017-07-01 | Discharge: 2017-07-01 | Disposition: A | Discharge status: Home / Self Care | Payer: Self-pay | Attending: Emergency Medicine | Admitting: Emergency Medicine

## 2017-07-01 ENCOUNTER — Encounter (HOSPITAL_COMMUNITY): Payer: Self-pay | Admitting: Emergency Medicine

## 2017-07-01 DIAGNOSIS — F17210 Nicotine dependence, cigarettes, uncomplicated: Secondary | ICD-10-CM | POA: Insufficient documentation

## 2017-07-01 DIAGNOSIS — F1410 Cocaine abuse, uncomplicated: Secondary | ICD-10-CM | POA: Insufficient documentation

## 2017-07-01 DIAGNOSIS — Z8673 Personal history of transient ischemic attack (TIA), and cerebral infarction without residual deficits: Secondary | ICD-10-CM | POA: Insufficient documentation

## 2017-07-01 DIAGNOSIS — I10 Essential (primary) hypertension: Secondary | ICD-10-CM | POA: Insufficient documentation

## 2017-07-01 DIAGNOSIS — F1020 Alcohol dependence, uncomplicated: Secondary | ICD-10-CM | POA: Insufficient documentation

## 2017-07-01 DIAGNOSIS — M79602 Pain in left arm: Secondary | ICD-10-CM | POA: Insufficient documentation

## 2017-07-01 DIAGNOSIS — Z79899 Other long term (current) drug therapy: Secondary | ICD-10-CM | POA: Insufficient documentation

## 2017-07-01 LAB — DIFFERENTIAL
BASOS ABS: 0 10*3/uL (ref 0.0–0.1)
BASOS PCT: 0 %
Eosinophils Absolute: 0.1 10*3/uL (ref 0.0–0.7)
Eosinophils Relative: 1 %
Lymphocytes Relative: 61 %
Lymphs Abs: 5.2 10*3/uL — ABNORMAL HIGH (ref 0.7–4.0)
Monocytes Absolute: 0.5 10*3/uL (ref 0.1–1.0)
Monocytes Relative: 6 %
NEUTROS ABS: 2.8 10*3/uL (ref 1.7–7.7)
NEUTROS PCT: 32 %

## 2017-07-01 LAB — COMPREHENSIVE METABOLIC PANEL
ALBUMIN: 4.1 g/dL (ref 3.5–5.0)
ALT: 22 U/L (ref 14–54)
AST: 29 U/L (ref 15–41)
Alkaline Phosphatase: 50 U/L (ref 38–126)
Anion gap: 15 (ref 5–15)
BUN: 16 mg/dL (ref 6–20)
CHLORIDE: 108 mmol/L (ref 101–111)
CO2: 18 mmol/L — AB (ref 22–32)
CREATININE: 0.95 mg/dL (ref 0.44–1.00)
Calcium: 8.7 mg/dL — ABNORMAL LOW (ref 8.9–10.3)
GFR calc Af Amer: >60 mL/min (ref >60)
GLUCOSE: 74 mg/dL (ref 65–99)
POTASSIUM: 3.7 mmol/L (ref 3.5–5.1)
SODIUM: 141 mmol/L (ref 135–145)
Total Bilirubin: 0.6 mg/dL (ref 0.3–1.2)
Total Protein: 7.2 g/dL (ref 6.5–8.1)

## 2017-07-01 LAB — I-STAT CHEM 8, ED
BUN: 18 mg/dL (ref 6–20)
CHLORIDE: 110 mmol/L (ref 101–111)
CREATININE: 1.1 mg/dL — AB (ref 0.44–1.00)
Calcium, Ion: 0.98 mmol/L — ABNORMAL LOW (ref 1.15–1.40)
GLUCOSE: 81 mg/dL (ref 65–99)
HEMATOCRIT: 33 % — AB (ref 36.0–46.0)
Hemoglobin: 11.2 g/dL — ABNORMAL LOW (ref 12.0–15.0)
Potassium: 3.5 mmol/L (ref 3.5–5.1)
Sodium: 141 mmol/L (ref 135–145)
TCO2: 20 mmol/L — ABNORMAL LOW (ref 22–32)

## 2017-07-01 LAB — CBC
HEMATOCRIT: 31.1 % — AB (ref 36.0–46.0)
Hemoglobin: 10.2 g/dL — ABNORMAL LOW (ref 12.0–15.0)
MCH: 29.2 pg (ref 26.0–34.0)
MCHC: 32.8 g/dL (ref 30.0–36.0)
MCV: 89.1 fL (ref 78.0–100.0)
Platelets: 198 10*3/uL (ref 150–400)
RBC: 3.49 MIL/uL — ABNORMAL LOW (ref 3.87–5.11)
RDW: 14.7 % (ref 11.5–15.5)
WBC: 8.6 10*3/uL (ref 4.0–10.5)

## 2017-07-01 LAB — APTT: APTT: 31 s (ref 24–36)

## 2017-07-01 LAB — I-STAT TROPONIN, ED: Troponin i, poc: 0 ng/mL (ref 0.00–0.08)

## 2017-07-01 LAB — ETHANOL: Alcohol, Ethyl (B): 207 mg/dL — ABNORMAL HIGH (ref <5)

## 2017-07-01 LAB — PROTIME-INR
INR: 1.23
PROTHROMBIN TIME: 15.4 s — AB (ref 11.4–15.2)

## 2017-07-01 LAB — CBG MONITORING, ED: Glucose-Capillary: 83 mg/dL (ref 65–99)

## 2017-07-01 MED ORDER — SODIUM CHLORIDE 0.9 % IV BOLUS (SEPSIS)
1000.0000 mL | Freq: Once | INTRAVENOUS | Status: AC
  Administered 2017-07-01: 1000 mL via INTRAVENOUS

## 2017-07-01 MED ORDER — BACLOFEN 10 MG PO TABS: 10.0000 mg | ORAL_TABLET | Freq: Three times a day (TID) | ORAL | 0 refills | Status: DC

## 2017-07-01 MED ORDER — KETOROLAC TROMETHAMINE 30 MG/ML IJ SOLN
30.0000 mg | Freq: Once | INTRAMUSCULAR | Status: AC
  Administered 2017-07-01: 30 mg via INTRAVENOUS
  Filled 2017-07-01: qty 1

## 2017-07-01 MED ORDER — BACLOFEN 10 MG PO TABS
10.0000 mg | ORAL_TABLET | Freq: Three times a day (TID) | ORAL | Status: DC
  Administered 2017-07-01: 10 mg via ORAL
  Filled 2017-07-01 (×3): qty 1

## 2017-07-01 NOTE — Consult Note (Signed)
Requesting Physician: Dr. Judd Lien    Chief Complaint:  Left arm worsening weakness and pain History obtained from:  Patient and Chart   HPI:                                                                                                                                       [name] is an 56 y.o. 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  The patient states that at 5 AM she noticed that her left side was much weaker she can now lift her left arm. She also complained of severe pain that radiates from her neck down to her left arm. She called 911 and arrived at [facility] is a stroke alert.  Patient recently seen by her PCP for left arm pain. She was started on Gabapentin.   Date last known well: 9.2.2018 Time last known well: 4 am tPA Given: No, unlikely to be stroke  NIHSS 3 MRS 2  Past Medical History:  Diagnosis Date  . Alcoholism (HCC)   . ANEMIA, VITAMIN B12 DEFICIENCY 11/11/2007   Documented low 175 on 09/2007; received 6 yrs of IM therapy; switched to po 05/2013   . Anxiety   . Cardiac arrest (HCC) 01/16/2013   witnessed, shockable rhythm, initally low EF, no CAD at cath. Drug screen negative.  . Chest pain    a. 04/2009 Echo: EF 55-60%, Gr1 DD, Mild MR.  . Depression   . Duodenitis    secondary to H pylori(treatment completed 05/2006), +/- NSAIDS  . Duodenitis    a. 05/2006 2/2 H pylori +/- NSAIDS  . DVT (deep venous thrombosis) (HCC)    a. 04/2009 - treated with coumadin x 8 mos.  Marland Kitchen ETOH abuse   . Heart murmur   . History of cocaine abuse   . History of CVA (cerebrovascular accident)   . Hypertension   . PE 05/16/2009   CT angio positive for PE in 07/10, treated with coumadin for 8 months.   . Pulmonary embolism (HCC)    a. 04/2009 - treated with coumadin x 8 mos.  . Rectal bleeding 2007   internal hemmorhoids by colonoscopy in  04/2006, Bx neg for IBD  . Rectal bleeding    a. int hemorrhoids by colonoscopy in 04/2006, Bx neg for IBD  . Seizures (HCC)   . Stroke (HCC)   . Tobacco abuse     Past Surgical History:  Procedure Laterality Date  . CESAREAN SECTION    . LEFT HEART CATHETERIZATION WITH CORONARY ANGIOGRAM N/A 06/23/2014   Procedure: LEFT HEART CATHETERIZATION WITH CORONARY ANGIOGRAM;  Surgeon: Laurey Morale, MD; No CAD    Family History  Problem Relation Age of Onset  . Coronary artery disease Unknown  1 st degree female relative <60 and female <50  . Diabetes type II Unknown        1 st degree relative  . Breast cancer Unknown        1st degree relative <50  . Breast cancer Mother   . Heart disease Mother   . Diabetes Mother   . CAD Mother   . Heart attack Mother   . Hypertension Mother   . Heart disease Father   . Colon cancer Father   . CAD Father   . Heart disease Brother   . Diabetes Brother   . Breast cancer Sister   . Diabetes Sister   . CAD Brother   . Hypertension Sister    Social History:  reports that she has been smoking Cigarettes.  She has been smoking about 0.10 packs per day. She has never used smokeless tobacco. She reports that she drinks alcohol. She reports that she uses drugs, including "Crack" cocaine and Cocaine.  Allergies:  Allergies  Allergen Reactions  . Iodinated Diagnostic Agents Other (See Comments)    Seizure  . Omnipaque [Iohexol] Other (See Comments)    Seizure     Medications:                                                                                                                           Reviewed medications   ROS:                                                                                                                                         General ROS: negative for - chills, fatigue, fever, night sweats, weight gain or weight loss Psychological ROS: negative for - behavioral disorder, hallucinations, memory difficulties,  mood swings or suicidal ideation Ophthalmic ROS: negative for - blurry vision, double vision, eye pain or loss of vision ENT ROS: negative for - epistaxis, nasal discharge, oral lesions, sore throat, tinnitus or vertigo Allergy and Immunology ROS: negative for - hives or itchy/watery eyes Hematological and Lymphatic ROS: negative for - bleeding problems, bruising or swollen lymph nodes Endocrine ROS: negative for - galactorrhea, hair pattern changes, polydipsia/polyuria or temperature intolerance Respiratory ROS: negative for - cough, hemoptysis, shortness of breath or wheezing Cardiovascular ROS: negative for - chest pain, dyspnea on exertion, edema or irregular heartbeat Gastrointestinal ROS: negative for - abdominal pain, diarrhea,  hematemesis, nausea/vomiting or stool incontinence Genito-Urinary ROS: negative for - dysuria, hematuria, incontinence or urinary frequency/urgency Musculoskeletal ROS: negative for - joint swelling or muscular weakness Neurological ROS: as noted in HPI Dermatological ROS: negative for rash and skin lesion changes   Examination:                                                                                                      General: Appears well-developed and well-nourished.  Psych: Affect appropriate to situation Eyes: No scleral injection HENT: No OP obstrucion Head: Normocephalic.  Cardiovascular: Normal rate and regular rhythm.  Respiratory: Effort normal and breath sounds normal to anterior ascultation GI: Soft.  No distension. There is no tenderness.  Skin: WDI   Neurological Examination Mental Status: Alert, oriented, thought content appropriate.  Speech fluent without evidence of aphasia.  Able to follow 3 step commands without difficulty. Cranial Nerves: II: Discs flat bilaterally; Visual fields grossly normal,  III,IV, VI: ptosis not present, extra-ocular motions intact bilaterally, pupils equal, round, reactive to light and  accommodation V,VII: mild left NFF VIII: hearing normal bilaterally IX,X: uvula rises symmetrically XI: bilateral shoulder shrug XII: midline tongue extension Motor: Right : Upper extremity   5/5    Left:     Upper extremity   4/5  Lower extremity   5/5     Lower extremity   4/5 Increased tone in left arm  Sensory: Pinprick and light touch intact throughout, bilaterally Deep Tendon Reflexes: 2+ and symmetric throughout Plantars: Right: downgoing   Left: downgoing Cerebellar: normal finger-to-nose, normal rapid alternating movements and normal heel-to-shin test Gait: normal gait and station     Lab Results: Basic Metabolic Panel:  Recent Labs Lab 07/01/17 0520 07/01/17 0529  NA 141 141  K 3.7 3.5  CL 108 110  CO2 18*  --   GLUCOSE 74 81  BUN 16 18  CREATININE 0.95 1.10*  CALCIUM 8.7*  --     CBC:  Recent Labs Lab 07/01/17 0520 07/01/17 0529  WBC 8.6  --   NEUTROABS 2.8  --   HGB 10.2* 11.2*  HCT 31.1* 33.0*  MCV 89.1  --   PLT 198  --     Coagulation Studies:  Recent Labs  07/01/17 0520  LABPROT 15.4*  INR 1.23    Imaging: Ct Head Wo Contrast  Result Date: 07/01/2017 CLINICAL DATA:  56 y/o  F; code stroke with left-sided weakness. EXAM: CT HEAD WITHOUT CONTRAST TECHNIQUE: Contiguous axial images were obtained from the base of the skull through the vertex without intravenous contrast. COMPARISON:  10/12/2016 CT of the head. FINDINGS: Brain: Chronic right posterior MCA distribution and left occipital lobe infarctions. No evidence for new large acute infarct, intracranial hemorrhage, or focal mass effect. No hydrocephalus or extra-axial collection. Vascular: No hyperdense vessel or unexpected calcification. Skull: Normal. Negative for fracture or focal lesion. Sinuses/Orbits: No acute finding.  Hypoplastic maxillary sinuses. Other: None. IMPRESSION: 1. No acute intracranial abnormality. 2. ASPECTS is 10. 3. Chronic right posterior MCA and left occipital lobe  infarctions. These results were called  by telephone at the time of interpretation on 07/01/2017 at 5:45 am to Dr. Arther Dames , who verbally acknowledged these results. Electronically Signed   By: Mitzi Hansen M.D.   On: 07/01/2017 05:45     ASSESSMENT AND PLAN   [name] is an 56 y.o. 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 However, most of weakness appears limited due to pain and overcome with increased effort. CT head negative for acute process.   Left arm and leg pain and spasticity  Old R MCA stroke with Residual left Hemiparesis Current ETOH and Cocaine abuser  Plan Baclofen 10 mg TID Toradal IV 30 mg for pain If improves, outpatient follow up with Neurology

## 2017-07-01 NOTE — ED Triage Notes (Signed)
Per EMS pt last known well 0400.  Pt has previous left-sided deficits from a past CVA.  Pt states she is having pain on her entire left side up to her neck.  Pt states she "blacks out" and falls a lot.

## 2017-07-01 NOTE — ED Notes (Signed)
Blood collected

## 2017-07-01 NOTE — ED Provider Notes (Signed)
[facility]-EMERGENCY DEPT Provider Note   CSN: [id number] Arrival date & time: 07/01/17  6213   An emergency department physician performed an initial assessment on this suspected stroke patient at 0521.  History   Chief Complaint Chief Complaint  Patient presents with  . Code Stroke    HPI [name] is a 56 y.o. female.  HPI   [name] is a 56 y.o. female, with a history of Alcohol abuse, cocaine abuse, anemia, CVA, seizures, DVT, and HTN presenting to the ED with left arm pain beginning about 0400 while she was cooking. Pain is located in the left shoulder and trapezius, aching "down in the bones," 9/10, radiates down the arm and into the left neck. She states she saw her PCP last week for similar pain and was prescribed a "pill for nerve pain."  Patient does complain of worsened weakness and decreased sensation on the left side, especially in the left fingers and toes, beginning "a few weeks ago." Symptoms improved prior to ED arrival, however, patient still complains of left upper extremity pain and weakness. Deficits from previous stroke include left sided weakness and decreased sensation, especially loss of fine motor function. She endorses frequent falls due to substance abuse combined with baseline left LE weakness. Last fall "about two weeks ago."  Admits to alcohol and cocaine use this morning. Drank six 12oz beers and a fifth of vodka over the last 24 hours. She endorses periods of regular alcohol use, but can not quantify stating, "I just drink enough to get drunk."  Denies recent falls/trauma, chest pain, shortness of breath, headache, fever/chills, abdominal pain, N/V/D, or any other complaints.   Past Medical History:  Diagnosis Date  . Alcoholism (HCC)   . ANEMIA, VITAMIN B12 DEFICIENCY 11/11/2007   Documented low 175 on 09/2007; received 6 yrs of IM therapy; switched to po 05/2013   . Anxiety   . Cardiac arrest (HCC) 01/16/2013   witnessed, shockable  rhythm, initally low EF, no CAD at cath. Drug screen negative.  . Chest pain    a. 04/2009 Echo: EF 55-60%, Gr1 DD, Mild MR.  . Depression   . Duodenitis    secondary to H pylori(treatment completed 05/2006), +/- NSAIDS  . Duodenitis    a. 05/2006 2/2 H pylori +/- NSAIDS  . DVT (deep venous thrombosis) (HCC)    a. 04/2009 - treated with coumadin x 8 mos.  Marland Kitchen ETOH abuse   . Heart murmur   . History of cocaine abuse   . History of CVA (cerebrovascular accident)   . Hypertension   . PE 05/16/2009   CT angio positive for PE in 07/10, treated with coumadin for 8 months.   . Pulmonary embolism (HCC)    a. 04/2009 - treated with coumadin x 8 mos.  . Rectal bleeding 2007   internal hemmorhoids by colonoscopy in 04/2006, Bx neg for IBD  . Rectal bleeding    a. int hemorrhoids by colonoscopy in 04/2006, Bx neg for IBD  . Seizures (HCC)   . Stroke (HCC)   . Tobacco abuse     Patient Active Problem List   Diagnosis Date Noted  . Left arm pain 06/11/2017  . Claudication of both lower extremities (HCC) 10/18/2016  . Dark stools 10/18/2016  . Unintentional weight loss 07/26/2016  . Insomnia 07/26/2016  . Trochanteric bursitis of left hip 05/22/2016  . H/O chest pain 06/08/2014  . Right knee pain 01/26/2014  . Unspecified vitamin D deficiency 09/23/2013  .  Menopausal syndrome (mainly irritability and hot flashes)  06/02/2013  . H/O seasonal allergies 02/28/2013  . GERD (gastroesophageal reflux disease) 02/28/2013  . Nonischemic cardiomyopathy, LBBB (likely d/t ETOH abuse)  01/19/2013  . History of cardiac arrest 01/16/2013  . Hypertension 05/05/2011  . Health care maintenance 01/02/2011  . ANEMIA, VITAMIN B12 DEFICIENCY 11/11/2007  . Polysubstance abuse (with elevated transaminases)  05/09/2007    Past Surgical History:  Procedure Laterality Date  . CESAREAN SECTION    . LEFT HEART CATHETERIZATION WITH CORONARY ANGIOGRAM N/A 06/23/2014   Procedure: LEFT HEART CATHETERIZATION WITH CORONARY  ANGIOGRAM;  Surgeon: Laurey Morale, MD; No CAD    OB History    No data available       Home Medications    Prior to Admission medications   Medication Sig Start Date End Date Taking? Authorizing Provider  atorvastatin (LIPITOR) 40 MG tablet Take 1 tablet (40 mg total) by mouth daily. 07/26/16  Yes Althia Forts, MD  carvedilol (COREG) 12.5 MG tablet Take 1 tablet (12.5 mg total) by mouth 2 (two) times daily. 07/26/16  Yes Althia Forts, MD  Cholecalciferol 1000 units capsule Take 1 capsule (1,000 Units total) by mouth daily. 07/26/16  Yes Althia Forts, MD  gabapentin (NEURONTIN) 300 MG capsule Take 1 capsule (300 mg total) by mouth 2 (two) times daily. 06/11/17 06/11/18 Yes Santos-Sanchez, Chelsea Primus, MD  nitroGLYCERIN (NITROSTAT) 0.4 MG SL tablet Place 1 tablet (0.4 mg total) under the tongue every 5 (five) minutes as needed for chest pain. 07/26/16  Yes Althia Forts, MD  thiamine 100 MG tablet Take 1 tablet (100 mg total) by mouth daily. 07/26/16  Yes Althia Forts, MD  vitamin B-12 (CYANOCOBALAMIN) 1000 MCG tablet Take 1 tablet (1,000 mcg total) by mouth daily. 07/26/16  Yes Althia Forts, MD  baclofen (LIORESAL) 10 MG tablet Take 1 tablet (10 mg total) by mouth 3 (three) times daily. 07/01/17   Areliz Rothman C, PA-C  furosemide (LASIX) 20 MG tablet Take 1 tablet (20 mg total) by mouth daily. As needed Patient not taking: Reported on 07/01/2017 07/26/16   Althia Forts, MD  loratadine (CLARITIN) 10 MG tablet Take 1 tablet (10 mg total) by mouth daily as needed for allergies. Patient not taking: Reported on 07/01/2017 07/26/16   Althia Forts, MD    Family History Family History  Problem Relation Age of Onset  . Coronary artery disease Unknown        1 st degree female relative <60 and female <50  . Diabetes type II Unknown        1 st degree relative  . Breast cancer Unknown        1st degree relative <50  . Breast cancer Mother   . Heart disease Mother   . Diabetes Mother   . CAD Mother   .  Heart attack Mother   . Hypertension Mother   . Heart disease Father   . Colon cancer Father   . CAD Father   . Heart disease Brother   . Diabetes Brother   . Breast cancer Sister   . Diabetes Sister   . CAD Brother   . Hypertension Sister     Social History Social History  Substance Use Topics  . Smoking status: Current Some Day Smoker    Packs/day: 0.10    Types: Cigarettes  . Smokeless tobacco: Never Used     Comment: smokes about 1-2 cigs/day  . Alcohol use Yes     Comment: beer  Allergies   Iodinated diagnostic agents and Omnipaque [iohexol]   Review of Systems Review of Systems  Constitutional: Negative for chills and fever.  HENT: Negative for trouble swallowing.   Eyes: Negative for visual disturbance.  Respiratory: Negative for shortness of breath.   Cardiovascular: Negative for chest pain.  Gastrointestinal: Negative for abdominal pain, diarrhea, nausea and vomiting.  Musculoskeletal: Negative for back pain and neck pain.  Neurological: Positive for weakness and numbness. Negative for dizziness, syncope, light-headedness and headaches.  All other systems reviewed and are negative.    Physical Exam Updated Vital Signs BP 118/74 (BP Location: Right Arm)   Pulse 92   Temp 98.1 F (36.7 C) (Oral)   Resp 19   Ht 5\' 4"  (1.626 m)   Wt 61.2 kg (134 lb 14.7 oz)   LMP 11/02/2010   SpO2 98%   BMI 23.16 kg/m   Physical Exam  Constitutional: She is oriented to person, place, and time. She appears well-developed and well-nourished. No distress.  HENT:  Head: Normocephalic and atraumatic.  Mouth/Throat: Oropharynx is clear and moist.  Eyes: Pupils are equal, round, and reactive to light. Conjunctivae and EOM are normal.  Neck: Normal range of motion. Neck supple.  Cardiovascular: Normal rate, regular rhythm, normal heart sounds and intact distal pulses.   Pulmonary/Chest: Effort normal and breath sounds normal. No respiratory distress.  Abdominal:  Soft. There is no tenderness. There is no guarding.  Musculoskeletal: She exhibits tenderness. She exhibits no edema.  Tenderness to anterior left shoulder and lateral humerus Decreased ROM in left shoulder, pt states due to pain.   Lymphadenopathy:    She has no cervical adenopathy.  Neurological: She is alert and oriented to person, place, and time.  No sensory deficits.  Speech is normal with no signs of aphasia. Patient handles oral secretions without difficulty. No noted swallowing defect. Grip strength weaker on the left. Strength 4/5 in left elbow and shoulder. Strength 5/5 in right elbow and shoulder. Strength 4/5 with flexion and extension of the left hip, knees, and ankles.  Strength 5/5 with flexion and extension of the right hip, knees, and ankles. Patellar DTRs 2+ bilaterally Negative Romberg. Slow, shuffling gait, but stable. Coordination intact including heel to shin and finger to nose.  Cranial nerves III-XII otherwise grossly intact.  Left facial droop, normal for pt.  Skin: Skin is warm and dry. Capillary refill takes less than 2 seconds. She is not diaphoretic.  Psychiatric: She has a normal mood and affect. Her behavior is normal.  Nursing note and vitals reviewed.    ED Treatments / Results  Labs (all labs ordered are listed, but only abnormal results are displayed) Labs Reviewed  PROTIME-INR - Abnormal; Notable for the following:       Result Value   Prothrombin Time 15.4 (*)    All other components within normal limits  CBC - Abnormal; Notable for the following:    RBC 3.49 (*)    Hemoglobin 10.2 (*)    HCT 31.1 (*)    All other components within normal limits  DIFFERENTIAL - Abnormal; Notable for the following:    Lymphs Abs 5.2 (*)    All other components within normal limits  COMPREHENSIVE METABOLIC PANEL - Abnormal; Notable for the following:    CO2 18 (*)    Calcium 8.7 (*)    All other components within normal limits  ETHANOL - Abnormal;  Notable for the following:    Alcohol, Ethyl (B) 207 (*)  All other components within normal limits  I-STAT CHEM 8, ED - Abnormal; Notable for the following:    Creatinine, Ser 1.10 (*)    Calcium, Ion 0.98 (*)    TCO2 20 (*)    Hemoglobin 11.2 (*)    HCT 33.0 (*)    All other components within normal limits  APTT  URINALYSIS, ROUTINE W REFLEX MICROSCOPIC  RAPID URINE DRUG SCREEN, HOSP PERFORMED  CBG MONITORING, ED  I-STAT TROPONIN, ED  CBG MONITORING, ED    Hemoglobin  Date Value Ref Range Status  07/01/2017 11.2 (L) 12.0 - 15.0 g/dL Final  16/07/9603 54.0 (L) 12.0 - 15.0 g/dL Final  98/08/9146 82.9 (L) 12.0 - 15.0 g/dL Final  56/21/3086 57.8 (L) 11.1 - 15.9 g/dL Final  46/96/2952 84.1 (L) 12.0 - 15.0 g/dL Final   BUN  Date Value Ref Range Status  07/01/2017 18 6 - 20 mg/dL Final  32/44/0102 16 6 - 20 mg/dL Final  72/53/6644 20 6 - 20 mg/dL Final  03/47/4259 15 6 - 24 mg/dL Final  56/38/7564 9 6 - 20 mg/dL Final   Creat  Date Value Ref Range Status  08/24/2014 0.74 0.50 - 1.10 mg/dL Final  33/29/5188 4.16 0.50 - 1.10 mg/dL Final  60/63/0160 1.09 0.50 - 1.10 mg/dL Final  32/35/5732 2.02 0.50 - 1.10 mg/dL Final   Creatinine, Ser  Date Value Ref Range Status  07/01/2017 1.10 (H) 0.44 - 1.00 mg/dL Final  54/27/0623 7.62 0.44 - 1.00 mg/dL Final  83/15/1761 6.07 (H) 0.44 - 1.00 mg/dL Final  37/07/6268 4.85 (L) 0.57 - 1.00 mg/dL Final     EKG  EKG Interpretation None       Radiology Ct Head Wo Contrast  Result Date: 07/01/2017 CLINICAL DATA:  56 y/o  F; code stroke with left-sided weakness. EXAM: CT HEAD WITHOUT CONTRAST TECHNIQUE: Contiguous axial images were obtained from the base of the skull through the vertex without intravenous contrast. COMPARISON:  10/12/2016 CT of the head. FINDINGS: Brain: Chronic right posterior MCA distribution and left occipital lobe infarctions. No evidence for new large acute infarct, intracranial hemorrhage, or focal mass effect.  No hydrocephalus or extra-axial collection. Vascular: No hyperdense vessel or unexpected calcification. Skull: Normal. Negative for fracture or focal lesion. Sinuses/Orbits: No acute finding.  Hypoplastic maxillary sinuses. Other: None. IMPRESSION: 1. No acute intracranial abnormality. 2. ASPECTS is 10. 3. Chronic right posterior MCA and left occipital lobe infarctions. These results were called by telephone at the time of interpretation on 07/01/2017 at 5:45 am to Dr. Arther Dames , who verbally acknowledged these results. Electronically Signed   By: Mitzi Hansen M.D.   On: 07/01/2017 05:45   Mr Brain Wo Contrast  Result Date: 07/01/2017 CLINICAL DATA:  56 year old female with left side weakness. Code stroke. Radiculopathy. EXAM: MRI HEAD WITHOUT CONTRAST MRI CERVICAL SPINE WITHOUT CONTRAST TECHNIQUE: Multiplanar, multiecho pulse sequences of the brain and surrounding structures, and cervical spine, to include the craniocervical junction and cervicothoracic junction, were obtained without intravenous contrast. COMPARISON:  Head CT without contrast 0531 hours today. Brain MRI 10/09/2007 FINDINGS: MRI HEAD FINDINGS Brain: Chronic encephalomalacia with hemosiderin in right parietal lobe, posterior right frontal and temporal lobes with gliosis appears unchanged since 2008. A smaller area of cortical encephalomalacia in the left occipital pole is stable. Subtle chronic cortical encephalomalacia also suspected in the right occipital pole. Stable cerebral volume since 2008. No restricted diffusion to suggest acute infarction. No midline shift, mass effect, evidence of mass lesion, ventriculomegaly, extra-axial  collection or acute intracranial hemorrhage. Cervicomedullary junction and pituitary are within normal limits. No new signal abnormality identified since 2008. No other chronic cerebral blood products. The deep gray matter nuclei, brainstem and cerebellum remain normal. Vascular: Major intracranial  vascular flow voids appear stable since 2008, and preserved. Skull and upper cervical spine: Cervical spine findings are below. Normal bone marrow signal. Sinuses/Orbits: Normal orbits soft tissues. Stable and negative paranasal sinuses ; chronic maxillary periosteal thickening. Other: Visible internal auditory structures appear normal. Mastoids are clear. Scalp and face soft tissues appear negative. MRI CERVICAL SPINE FINDINGS Alignment: Normal.  Preserved cervical lordosis. Vertebrae: Normal bone marrow signal. No marrow edema or evidence of acute osseous abnormality. Cord: Spinal cord signal is within normal limits at all visualized levels. Posterior Fossa, vertebral arteries, paraspinal tissues: Cervicomedullary junction is within normal limits. Brain findings are above. Preserved vascular flow voids in the neck. Negative neck soft tissues. Disc levels: C2-C3:  Negative. C3-C4: Negative; the right vertebral artery is tortuous in the right C4 neural foramen. C4-C5:  Minimal disc bulge and endplate spurring.  No stenosis. C5-C6:  Minimal disc bulge and endplate spurring.  No stenosis. C6-C7:  Negative. C7-T1:  Negative. Negative visualized upper thoracic levels. IMPRESSION: 1.  No acute intracranial abnormality. 2. Stable MRI appearance of the brain since 2008 with chronic encephalomalacia affecting a large portion of the posterior right MCA territory, and a small area of the distal left PCA territory. 3. Essentially normal for age cervical spine MRI; minimal mid cervical disc bulging and endplate spurring but no spinal or convincing foraminal stenosis. Electronically Signed   By: Odessa Fleming M.D.   On: 07/01/2017 14:39   Mr Cervical Spine Wo Contrast  Result Date: 07/01/2017 CLINICAL DATA:  56 year old female with left side weakness. Code stroke. Radiculopathy. EXAM: MRI HEAD WITHOUT CONTRAST MRI CERVICAL SPINE WITHOUT CONTRAST TECHNIQUE: Multiplanar, multiecho pulse sequences of the brain and surrounding  structures, and cervical spine, to include the craniocervical junction and cervicothoracic junction, were obtained without intravenous contrast. COMPARISON:  Head CT without contrast 0531 hours today. Brain MRI 10/09/2007 FINDINGS: MRI HEAD FINDINGS Brain: Chronic encephalomalacia with hemosiderin in right parietal lobe, posterior right frontal and temporal lobes with gliosis appears unchanged since 2008. A smaller area of cortical encephalomalacia in the left occipital pole is stable. Subtle chronic cortical encephalomalacia also suspected in the right occipital pole. Stable cerebral volume since 2008. No restricted diffusion to suggest acute infarction. No midline shift, mass effect, evidence of mass lesion, ventriculomegaly, extra-axial collection or acute intracranial hemorrhage. Cervicomedullary junction and pituitary are within normal limits. No new signal abnormality identified since 2008. No other chronic cerebral blood products. The deep gray matter nuclei, brainstem and cerebellum remain normal. Vascular: Major intracranial vascular flow voids appear stable since 2008, and preserved. Skull and upper cervical spine: Cervical spine findings are below. Normal bone marrow signal. Sinuses/Orbits: Normal orbits soft tissues. Stable and negative paranasal sinuses ; chronic maxillary periosteal thickening. Other: Visible internal auditory structures appear normal. Mastoids are clear. Scalp and face soft tissues appear negative. MRI CERVICAL SPINE FINDINGS Alignment: Normal.  Preserved cervical lordosis. Vertebrae: Normal bone marrow signal. No marrow edema or evidence of acute osseous abnormality. Cord: Spinal cord signal is within normal limits at all visualized levels. Posterior Fossa, vertebral arteries, paraspinal tissues: Cervicomedullary junction is within normal limits. Brain findings are above. Preserved vascular flow voids in the neck. Negative neck soft tissues. Disc levels: C2-C3:  Negative. C3-C4:  Negative; the right vertebral artery  is tortuous in the right C4 neural foramen. C4-C5:  Minimal disc bulge and endplate spurring.  No stenosis. C5-C6:  Minimal disc bulge and endplate spurring.  No stenosis. C6-C7:  Negative. C7-T1:  Negative. Negative visualized upper thoracic levels. IMPRESSION: 1.  No acute intracranial abnormality. 2. Stable MRI appearance of the brain since 2008 with chronic encephalomalacia affecting a large portion of the posterior right MCA territory, and a small area of the distal left PCA territory. 3. Essentially normal for age cervical spine MRI; minimal mid cervical disc bulging and endplate spurring but no spinal or convincing foraminal stenosis. Electronically Signed   By: Odessa Fleming M.D.   On: 07/01/2017 14:39   Dg Shoulder Left  Result Date: 07/01/2017 CLINICAL DATA:  Left shoulder pain following a fall. EXAM: LEFT SHOULDER - 2+ VIEW COMPARISON:  None. FINDINGS: There is no evidence of fracture or dislocation. There is no evidence of arthropathy or other focal bone abnormality. Soft tissues are unremarkable. IMPRESSION: No fracture or dislocation. Electronically Signed   By: Beckie Salts M.D.   On: 07/01/2017 07:04   Dg Humerus Left  Result Date: 07/01/2017 CLINICAL DATA:  Left upper arm pain following a fall. EXAM: LEFT HUMERUS - 2+ VIEW COMPARISON:  None. FINDINGS: There is no evidence of fracture or other focal bone lesions. Soft tissues are unremarkable. IMPRESSION: Normal examination. Electronically Signed   By: Beckie Salts M.D.   On: 07/01/2017 07:05    Procedures Procedures (including critical care time)  Medications Ordered in ED Medications  baclofen (LIORESAL) tablet 10 mg (10 mg Oral Given 07/01/17 0607)  ketorolac (TORADOL) 30 MG/ML injection 30 mg (30 mg Intravenous Given 07/01/17 0607)  sodium chloride 0.9 % bolus 1,000 mL (1,000 mLs Intravenous New Bag/Given 07/01/17 1135)     Initial Impression / Assessment and Plan / ED Course  I have reviewed the  triage vital signs and the nursing notes.  Pertinent labs & imaging results that were available during my care of the patient were reviewed by me and considered in my medical decision making (see chart for details).  Clinical Course as of Jul 02 1451  Sun Jul 01, 2017  6045 Patient is sleeping on reevaluation. Easily arousable. Pain is well-controlled.  [SJ]    Clinical Course User Index [SJ] Kiaria Quinnell C, PA-C   Patient presents with reported new weakness and decreased sensation on the left. Symptoms seem to have resolved prior to ED arrival. Code stroke activated prior to ED arrival. Patient evaluated by neurologist. Code stroke canceled. Spasticity due to previous strokes suspected. Neurologist recommends baclofen, Toradol, and then outpatient neurology follow-up.  MRI ordered due to some lasting, reportedly new pain and numbness in the left upper extremity. Her strength seems to be consistent with her reported previous deficit, however, this can not be known for sure.  Patient observed throughout her ED course with improvement in her pain and no worsening or recurrence of her other symptoms. MRI without acute deficit. Outpatient neurology follow-up. The patient was given instructions for home care as well as return precautions. Patient voices understanding of these instructions, accepts the plan, and is comfortable with discharge.   Findings and plan of care discussed with Emily Filbert, MD.   Vitals:   07/01/17 4098 07/01/17 0615 07/01/17 0704 07/01/17 0800  BP:  108/74    Pulse:  87 81 89  Resp:  (!) 21 19 12   Temp: 98.1 F (36.7 C)     TempSrc:  SpO2:  97% 98% 99%  Weight:      Height:       Vitals:   07/01/17 1120 07/01/17 1130 07/01/17 1136 07/01/17 1401  BP:   120/75 119/75  Pulse: 73 72 75 91  Resp: 19 (!) 21 18   Temp:      TempSrc:      SpO2: 96% 96% 95% 100%  Weight:      Height:          Final Clinical Impressions(s) / ED Diagnoses   Final diagnoses:    Pain of left upper extremity    New Prescriptions New Prescriptions   BACLOFEN (LIORESAL) 10 MG TABLET    Take 1 tablet (10 mg total) by mouth 3 (three) times daily.     Anselm Pancoast, PA-C 07/01/17 1453    Geoffery Lyons, MD 07/01/17 2251

## 2017-07-01 NOTE — ED Notes (Signed)
Patient transported to MRI 

## 2017-07-01 NOTE — ED Notes (Signed)
Code stroke cancelled per neurologist 

## 2017-07-01 NOTE — Discharge Instructions (Signed)
There were no acute abnormalities noted on the imaging studies. Lab results were encouraging. Please use the baclofen as needed for extremity pain and spasms. Please follow-up with your primary care provider as well as neurology as soon as possible. Call the number provided to set up an appointment with a neurologist. Return to the ED as needed.

## 2017-07-03 ENCOUNTER — Other Ambulatory Visit: Payer: Self-pay | Admitting: Pharmacist

## 2017-07-03 DIAGNOSIS — E559 Vitamin D deficiency, unspecified: Secondary | ICD-10-CM

## 2017-07-03 DIAGNOSIS — M79602 Pain in left arm: Secondary | ICD-10-CM

## 2017-07-03 DIAGNOSIS — I10 Essential (primary) hypertension: Secondary | ICD-10-CM

## 2017-07-04 MED ORDER — GABAPENTIN 300 MG PO CAPS: 300.0000 mg | ORAL_CAPSULE | Freq: Two times a day (BID) | ORAL | 2 refills | Status: DC

## 2017-07-04 MED ORDER — ATORVASTATIN CALCIUM 40 MG PO TABS: 40.0000 mg | ORAL_TABLET | Freq: Every day | ORAL | 3 refills | Status: DC

## 2017-07-04 MED ORDER — NITROGLYCERIN 0.4 MG SL SUBL: 0.4000 mg | SUBLINGUAL_TABLET | SUBLINGUAL | 5 refills | Status: DC | PRN

## 2017-07-04 MED ORDER — CHOLECALCIFEROL 25 MCG (1000 UT) PO CAPS: 1000.0000 [IU] | ORAL_CAPSULE | Freq: Every day | ORAL | 3 refills | Status: DC

## 2017-07-04 MED ORDER — CARVEDILOL 12.5 MG PO TABS: 12.5000 mg | ORAL_TABLET | Freq: Two times a day (BID) | ORAL | 3 refills | Status: DC

## 2017-07-27 ENCOUNTER — Encounter: Payer: Self-pay | Admitting: Internal Medicine

## 2017-08-17 ENCOUNTER — Encounter: Payer: Self-pay | Admitting: Internal Medicine

## 2017-11-23 ENCOUNTER — Other Ambulatory Visit: Payer: Self-pay

## 2017-11-23 ENCOUNTER — Ambulatory Visit (INDEPENDENT_AMBULATORY_CARE_PROVIDER_SITE_OTHER): Payer: Self-pay | Admitting: Internal Medicine

## 2017-11-23 VITALS — BP 153/69 | HR 74 | Temp 97.8°F | Ht 64.0 in | Wt 143.2 lb

## 2017-11-23 DIAGNOSIS — F1410 Cocaine abuse, uncomplicated: Secondary | ICD-10-CM

## 2017-11-23 DIAGNOSIS — F1010 Alcohol abuse, uncomplicated: Secondary | ICD-10-CM

## 2017-11-23 DIAGNOSIS — R05 Cough: Secondary | ICD-10-CM

## 2017-11-23 DIAGNOSIS — M50222 Other cervical disc displacement at C5-C6 level: Secondary | ICD-10-CM

## 2017-11-23 DIAGNOSIS — Z720 Tobacco use: Secondary | ICD-10-CM

## 2017-11-23 DIAGNOSIS — D649 Anemia, unspecified: Secondary | ICD-10-CM | POA: Insufficient documentation

## 2017-11-23 DIAGNOSIS — K0889 Other specified disorders of teeth and supporting structures: Secondary | ICD-10-CM

## 2017-11-23 DIAGNOSIS — R059 Cough, unspecified: Secondary | ICD-10-CM | POA: Insufficient documentation

## 2017-11-23 DIAGNOSIS — M79602 Pain in left arm: Secondary | ICD-10-CM

## 2017-11-23 DIAGNOSIS — I10 Essential (primary) hypertension: Secondary | ICD-10-CM

## 2017-11-23 DIAGNOSIS — M79622 Pain in left upper arm: Secondary | ICD-10-CM

## 2017-11-23 DIAGNOSIS — M7989 Other specified soft tissue disorders: Secondary | ICD-10-CM

## 2017-11-23 DIAGNOSIS — Z79899 Other long term (current) drug therapy: Secondary | ICD-10-CM

## 2017-11-23 DIAGNOSIS — R740 Nonspecific elevation of levels of transaminase and lactic acid dehydrogenase [LDH]: Secondary | ICD-10-CM

## 2017-11-23 DIAGNOSIS — F1910 Other psychoactive substance abuse, uncomplicated: Secondary | ICD-10-CM

## 2017-11-23 DIAGNOSIS — Z0000 Encounter for general adult medical examination without abnormal findings: Secondary | ICD-10-CM

## 2017-11-23 MED ORDER — AMLODIPINE BESYLATE 2.5 MG PO TABS: 2.5000 mg | ORAL_TABLET | Freq: Every day | ORAL | 2 refills | Status: DC

## 2017-11-23 MED ORDER — GABAPENTIN 600 MG PO TABS: 600.0000 mg | ORAL_TABLET | Freq: Three times a day (TID) | ORAL | 2 refills | Status: DC

## 2017-11-23 MED ORDER — OMEPRAZOLE 20 MG PO CPDR: 20.0000 mg | DELAYED_RELEASE_CAPSULE | Freq: Every day | ORAL | 2 refills | Status: DC

## 2017-11-23 NOTE — Progress Notes (Signed)
   CC: L foot swelling and L upper arm pain   HPI:  [name] is a 57 y.o. female with PMH as below who presents to clinic for HTN follow up, L foot swelling and pain, and L upper extremity pain. Please see problem-based assessment and plan for further details.   Past Medical History:  Diagnosis Date  . Alcoholism (HCC)   . ANEMIA, VITAMIN B12 DEFICIENCY 11/11/2007   Documented low 175 on 09/2007; received 6 yrs of IM therapy; switched to po 05/2013   . Anxiety   . Cardiac arrest (HCC) 01/16/2013   witnessed, shockable rhythm, initally low EF, no CAD at cath. Drug screen negative.  . Chest pain    a. 04/2009 Echo: EF 55-60%, Gr1 DD, Mild MR.  . Depression   . Duodenitis    secondary to H pylori(treatment completed 05/2006), +/- NSAIDS  . Duodenitis    a. 05/2006 2/2 H pylori +/- NSAIDS  . DVT (deep venous thrombosis) (HCC)    a. 04/2009 - treated with coumadin x 8 mos.  Marland Kitchen ETOH abuse   . Heart murmur   . History of cocaine abuse   . History of CVA (cerebrovascular accident)   . Hypertension   . PE 05/16/2009   CT angio positive for PE in 07/10, treated with coumadin for 8 months.   . Pulmonary embolism (HCC)    a. 04/2009 - treated with coumadin x 8 mos.  . Rectal bleeding 2007   internal hemmorhoids by colonoscopy in 04/2006, Bx neg for IBD  . Rectal bleeding    a. int hemorrhoids by colonoscopy in 04/2006, Bx neg for IBD  . Seizures (HCC)   . Stroke (HCC)   . Tobacco abuse    Review of Systems:   Review of Systems  Constitutional: Negative for chills, fever, malaise/fatigue and weight loss.  Respiratory: Negative for cough and shortness of breath.   Cardiovascular: Negative for chest pain, palpitations and leg swelling.  Musculoskeletal: Positive for joint pain. Negative for myalgias.  Neurological: Negative for dizziness, focal weakness, weakness and headaches.    Physical Exam:  Vitals:   11/23/17 1414  BP: (!) 153/69  Pulse: 74  Temp: 97.8 F (36.6 C)    TempSrc: Oral  SpO2: 100%  Weight: 143 lb 3.2 oz (65 kg)  Height: 5\' 4"  (1.626 m)   General: pleasant female, thin, well-developed, in no acute distress  HENT: NCAT, neck supple and FROM, OP clear without exudates or erythema, MMM, poor dentition  Eyes: anicteric sclera, PERRL, no conjunctival pallor  Cardiac: regular rate and rhythm, nl S1/S2, no murmurs, rubs or gallops  Pulm: CTAB, no wheezes or crackles, no increased work of breathing  Abd: soft, NTND, normoactive bowel sounds Neuro: A&Ox3, LUE with 5/5 strength and full ROM on passive and active motion  Ext: warm and well perfused, no peripheral edema noted bilaterally, nl cap refill    Assessment & Plan:   See Encounters Tab for problem based charting.  Patient discussed with Dr. Criselda Peaches

## 2017-11-23 NOTE — Patient Instructions (Addendum)
It was nice to see you today, [name]. We talked about several things today.   For your cough, I think it is due to acid reflux and I sent a prescription for omeprazole to your pharmacy. You will take 1 tablet (20 mg) in the morning 30 minutes before breakfast. This medication can take 4-8 weeks to work. Please call the clinic if no improvement in your cough in 4 weeks.   For your blood pressure, you will start taking amlodipine. You will take1 tablet (2.5 mg) every day. Please make a follow up appointment with me in 3 months.   For your arm pain, I increased your gabapentin to 600 3 times day so you will take 1 tablet 3 times a day. Please let me know if this change does not improve your pain as we can continue to adjust your medications.   For your leg swelling and pain we are getting a labwork. It would be ideal if you can come to clinic when you are having an episode of swelling and pain so that we can better examine you.   For your low blood count we are also getting blood work to determine what is causing it. I will call you with the results.   Please make a follow up appointment with me in 3 months. You can come before if you need to.

## 2017-11-24 ENCOUNTER — Encounter: Payer: Self-pay | Admitting: Internal Medicine

## 2017-11-24 LAB — CBC WITH DIFFERENTIAL/PLATELET
BASOS ABS: 0 10*3/uL (ref 0.0–0.2)
Basos: 0 %
EOS (ABSOLUTE): 0.2 10*3/uL (ref 0.0–0.4)
Eos: 2 %
Hematocrit: 34.8 % (ref 34.0–46.6)
Hemoglobin: 11.2 g/dL (ref 11.1–15.9)
IMMATURE GRANS (ABS): 0 10*3/uL (ref 0.0–0.1)
IMMATURE GRANULOCYTES: 0 %
LYMPHS: 28 %
Lymphocytes Absolute: 2.7 10*3/uL (ref 0.7–3.1)
MCH: 29.2 pg (ref 26.6–33.0)
MCHC: 32.2 g/dL (ref 31.5–35.7)
MCV: 91 fL (ref 79–97)
Monocytes Absolute: 0.8 10*3/uL (ref 0.1–0.9)
Monocytes: 8 %
NEUTROS PCT: 62 %
Neutrophils Absolute: 6.1 10*3/uL (ref 1.4–7.0)
PLATELETS: 312 10*3/uL (ref 150–379)
RBC: 3.83 x10E6/uL (ref 3.77–5.28)
RDW: 15 % (ref 12.3–15.4)
WBC: 10 10*3/uL (ref 3.4–10.8)

## 2017-11-24 LAB — IRON AND TIBC
Iron Saturation: 12 % — ABNORMAL LOW (ref 15–55)
Iron: 38 ug/dL (ref 27–159)
TIBC: 306 ug/dL (ref 250–450)
UIBC: 268 ug/dL (ref 131–425)

## 2017-11-24 LAB — RETICULOCYTES: RETIC CT PCT: 1.6 % (ref 0.6–2.6)

## 2017-11-24 LAB — FERRITIN: FERRITIN: 219 ng/mL — AB (ref 15–150)

## 2017-11-24 LAB — VITAMIN B12: VITAMIN B 12: 275 pg/mL (ref 232–1245)

## 2017-11-24 LAB — URIC ACID: URIC ACID: 7.7 mg/dL — AB (ref 2.5–7.1)

## 2017-11-24 NOTE — Assessment & Plan Note (Addendum)
BP 153/69 during today's visit. Patient is not on any antihypertensive medications. She denies headache, changes in vision, chest pain, and shortness of breath. She was previously on Lasix 20 PRN and lisinopril 5 mg which were discontinued due to hypotension. Discussed with patient need to start an antihypertensive agent as her BP is not at goal. She is agreeable to this. Will start CCB at this time and follow up in 4 weeks to ensure adequate BP control.   - Start amlodipine 2.5 mg QD  - Follow up in 4 weeks. If remains hypertensive would increase amlodipine to 5 mg QD. Would not add lisinopril given history of hypotension and syncope while on 5 mg and would not add HCTZ as she is reporting episodes of L foot pain and swelling concerning for gout.

## 2017-11-24 NOTE — Assessment & Plan Note (Signed)
Last colonoscopy in 2017. Discussed with patient she is due for a colonoscopy this year and emphasized importance of cancer screening given history of colon cancer in father. Patient is agreeable to this.   - GI referral for colonoscopy  - Declined flu shot, stated she received at her local pharmacy

## 2017-11-24 NOTE — Assessment & Plan Note (Signed)
Patient has a remote history of anemia secondary Vit B12 deficiency that she received treatment for in the past. ED labs on 06/2017 Hgb 11.9 with MCV 89 which is not consistent with B12 deficiency. Iron studies in 2014 were normal. No history of kidney disease. No signs of hemolysis either. She reports no symptoms concerning for symptomatic anemia. Denies hematochezia and melena.  On exam, there is no conjunctival pallor, MMM, and nl capillary refill. Differential diagnosis includes: IDA, AOCD, malignancy, bone marrow suppression secondary to alcohol abuse, etc. Will order studies as below to further evaluate.   - F/u CBC, iron studies, B12, retic count

## 2017-11-24 NOTE — Assessment & Plan Note (Signed)
Patient reports episodes of unilateral swelling the left foot for the past few months that worsened after Christmas. The swelling is limited to the foot. She experiences severe pain during these episodes and is unable to ambulate and use her regular shoes. She is unable to describe the pain and only states "it is an ugly pain." States episodes last for ~ 1 week and she only gets relief by staying off of it or with Epson salt. She recently fell 2 weeks ago as a result of this. Denies Loc or head trauma. On exam, there is no swelling on her LLE. Negative Hoffman's sign and she is able to ambulate without difficulty. Discussed with patient she could be having gout flares and advised her to come to clinic the next time she experiences similar symptoms.  Also advised her to keep a food diary to assess correlation of symptoms with diet.  - Uric acid  - Advised patient to keep food diary and to return to clinic when experiencing symptoms

## 2017-11-24 NOTE — Assessment & Plan Note (Signed)
Patient presents with 2-week history of cough that is mostly dry, but productive of clear sputum at times. Her cough worsens after eating and at night. She also endorses burning chest pain in the middle of her chest. She used to be on a PPI but ran out of it 2 months ago. She denies hemoptysis, fever, chills, dysphagia, weight loss, and sick contacts. She does not have a history of asthma or COPD, but does smoke 2 cigarettes per day. Not on an ACEi. On exam, OP looks clear without erythema or exudates and her lungs sound clear to ausculation. It is likely that her cough is due to GERD given her past history and worsening with meals and at night. Very low suspicion for infection at this time. Malignancy also in the differential given patient is a smoker. If her cough does not improve in the next 4-8 weeks, advised her to return to clinic for further evaluation with CXR.   - Omeprazole 20 mg QD with breakfast. Patient was counseled on how to appropriately take medication.  - Advised to return to clinic in 4-8 weeks if cough does not improve as she will need further workup (CXR for evaluation of chronic cough in a smoker).  - Encouraged smoking cessation, though patient does not feel ready to quit at this time

## 2017-11-24 NOTE — Assessment & Plan Note (Signed)
Patient continues to complain of LUE pain that she describes as shooting pain. States her arm does not bother her during the day, but the pain worsens at night and she has difficulty sleeping. She was prescribed gabapentin 300 mg BID daily in 05/2017 by me as her pain was thought to be secondary to cervical radiculopathy. States gabapentin at current dose is not working, but she has noticed improvement in pain when she takes an extra tablet. Has also tried Ibuprofen 500 mg BID. Of note, on 06/2017 she presented to the ED with similar symptoms and was evaluated for stroke. Her MRI  Brain was negative. MRI C-spine showed minimal disc bulge and endplate spurring on C4-C5 and C5-C6. She was advised to follow up with neurology as an outpatient, but was unable to follow up.   - Increase gabapentin 300 mg BID --> 600 mg TID  - Advised to stop Ibuprofen since it not helping with symptoms

## 2017-11-24 NOTE — Assessment & Plan Note (Addendum)
Patient reports she continues to drink alcohol but has cut back to one beer per day.  She also reports using cocaine yesterday.  CMET with nl LFTs 06/2017. States she has been stress at home after her mother suffered a stroke recently.  Counseled patient on alcohol and cocaine cessation.  Discussed this could have precautions in her health given her prior history of cardiac arrest.  Patient voiced understanding and states he is trying to quit but is very difficult on this time.  - Continue to counsel patient on alcohol and cocaine cessation

## 2017-11-26 NOTE — Progress Notes (Signed)
Internal Medicine Clinic Attending  Case discussed with Dr. Santos-Sanchez at the time of the visit.  We reviewed the resident's history and exam and pertinent patient test results.  I agree with the assessment, diagnosis, and plan of care documented in the resident's note.    

## 2017-12-06 ENCOUNTER — Telehealth: Payer: Self-pay | Admitting: Internal Medicine

## 2017-12-06 NOTE — Telephone Encounter (Signed)
Have called pt and her mailbox is full, have no idea what med she wanted

## 2017-12-06 NOTE — Telephone Encounter (Signed)
Called patient but no answer. She is on medication for neuropathic pain (gabapentin) that I refilled recently so I'm not sure what pain medication she is referring to.

## 2017-12-06 NOTE — Telephone Encounter (Signed)
Patient is requesting refill on pain meds, [employer]

## 2017-12-06 NOTE — Telephone Encounter (Signed)
Did she specify which medication? 

## 2017-12-13 ENCOUNTER — Other Ambulatory Visit: Payer: Self-pay | Admitting: Internal Medicine

## 2017-12-25 ENCOUNTER — Encounter: Payer: Self-pay | Admitting: Internal Medicine

## 2018-01-03 ENCOUNTER — Ambulatory Visit: Payer: Self-pay

## 2018-01-10 ENCOUNTER — Ambulatory Visit: Payer: Self-pay

## 2018-01-24 ENCOUNTER — Telehealth: Payer: Self-pay | Admitting: Internal Medicine

## 2018-01-24 NOTE — Telephone Encounter (Signed)
CALLED PATIENT, SHE WAS OUT TALKED WITH HER SISTER, SHE WILL HAVE HER CALL ME BACK TO MAKE APPT FOR ORANGE CARD

## 2018-02-06 ENCOUNTER — Encounter: Payer: Self-pay | Admitting: *Deleted

## 2018-03-08 NOTE — Addendum Note (Signed)
Addended by: Neomia Dear on: 03/08/2018 06:38 PM   Modules accepted: Orders

## 2019-01-03 ENCOUNTER — Encounter: Payer: Self-pay | Admitting: Internal Medicine

## 2019-02-20 ENCOUNTER — Other Ambulatory Visit: Payer: Self-pay

## 2019-02-20 ENCOUNTER — Ambulatory Visit: Payer: Self-pay | Admitting: Internal Medicine

## 2019-02-20 ENCOUNTER — Telehealth: Payer: Self-pay | Admitting: *Deleted

## 2019-02-20 ENCOUNTER — Other Ambulatory Visit: Payer: Self-pay | Admitting: *Deleted

## 2019-02-20 DIAGNOSIS — R05 Cough: Secondary | ICD-10-CM

## 2019-02-20 DIAGNOSIS — R059 Cough, unspecified: Secondary | ICD-10-CM

## 2019-02-20 DIAGNOSIS — I10 Essential (primary) hypertension: Secondary | ICD-10-CM

## 2019-02-20 DIAGNOSIS — M79602 Pain in left arm: Secondary | ICD-10-CM

## 2019-02-20 DIAGNOSIS — I428 Other cardiomyopathies: Secondary | ICD-10-CM

## 2019-02-20 MED ORDER — GABAPENTIN 600 MG PO TABS: 600.0000 mg | ORAL_TABLET | Freq: Three times a day (TID) | ORAL | 1 refills | Status: DC

## 2019-02-20 MED ORDER — AMLODIPINE BESYLATE 2.5 MG PO TABS: 2.5000 mg | ORAL_TABLET | Freq: Every day | ORAL | 3 refills | Status: DC

## 2019-02-20 MED ORDER — OMEPRAZOLE 20 MG PO CPDR: 20.0000 mg | DELAYED_RELEASE_CAPSULE | Freq: Every day | ORAL | 1 refills | Status: DC

## 2019-02-20 MED ORDER — ATORVASTATIN CALCIUM 40 MG PO TABS: 40.0000 mg | ORAL_TABLET | Freq: Every day | ORAL | 3 refills | Status: DC

## 2019-02-20 MED ORDER — CARVEDILOL 6.25 MG PO TABS: 6.2500 mg | ORAL_TABLET | Freq: Two times a day (BID) | ORAL | 3 refills | Status: DC

## 2019-02-20 MED ORDER — FUROSEMIDE 20 MG PO TABS: 20.0000 mg | ORAL_TABLET | Freq: Every day | ORAL | 1 refills | Status: DC

## 2019-02-20 MED ORDER — NITROGLYCERIN 0.4 MG SL SUBL: 0.4000 mg | SUBLINGUAL_TABLET | SUBLINGUAL | 1 refills | Status: DC | PRN

## 2019-02-20 NOTE — Telephone Encounter (Signed)
Agree! Thanks, Myriam Jacobson! I refilled all her meds at the same dosages with the exception of Coreg which I sent a prescription for at a lower dose with goal of uptitration to previous dose.

## 2019-02-20 NOTE — Telephone Encounter (Signed)
NURSING TRIAGE NOTE FOR RESPIRATORY SYMPTOMS  Do you have a fever? "no, maam"   Do you have a cough?"i dont cough a whole lot, not like a sick cough"   Do you have shortness of breath more than normal? " about 2 weeks, walking, washing dishes, doing my little jobs around the house"  Do you have chest pain?"a little, just when I get short of breath, not bad, just a little"  Are you able to eat and drink normally? "yes"  Have you seen a physician for these symptoms? " not since I came and saw my doctor in jan"  Pt states she has not taken her medicine in 2 months, states she missed her appt and ran out of meds and just hasnt been thinking about taking her meds.   Action: informed pt that we will go over her meds and call pharm to refill, she states her brother will pay for it and pick it up. Ask her to stay in, drink fluids, rest, call 911 for increased chest pain, short of breath. Will be calling back or a md will call her back.  I informed patient to expect a phone call from a physician soon and I sent request to front desk to schedule a virtual office appt for patient and arrive the patient.

## 2019-02-20 NOTE — Telephone Encounter (Signed)
I agree with a telehealth visit

## 2019-02-20 NOTE — Telephone Encounter (Signed)
I have called all her ph# back and no response.

## 2019-02-21 NOTE — Telephone Encounter (Signed)
Have attempted several times to rtc to pt, called all ph# listed on chart, no answers at any of 3

## 2019-02-21 NOTE — Progress Notes (Deleted)
Called patient several times yesterday 4/23 and today 4/24. Phone rings once or twice and call cuts off.

## 2019-02-22 NOTE — Telephone Encounter (Signed)
Thanks [name]! I will keep trying.

## 2019-02-25 ENCOUNTER — Other Ambulatory Visit: Payer: Self-pay

## 2019-02-25 ENCOUNTER — Encounter: Payer: Self-pay | Admitting: Internal Medicine

## 2019-02-27 ENCOUNTER — Telehealth: Payer: Self-pay | Admitting: Internal Medicine

## 2019-02-27 NOTE — Telephone Encounter (Signed)
I have called patient numerous times for the past week and have not been able to reach her. Number is disconnected. Called friend listed in chart but no answer either.

## 2019-09-29 ENCOUNTER — Other Ambulatory Visit (HOSPITAL_COMMUNITY)
Admission: RE | Admit: 2019-09-29 | Discharge: 2019-09-29 | Disposition: A | Discharge status: Home / Self Care | Payer: Self-pay | Source: Ambulatory Visit | Attending: Internal Medicine | Admitting: Internal Medicine

## 2019-09-29 ENCOUNTER — Encounter: Payer: Self-pay | Admitting: Internal Medicine

## 2019-09-29 ENCOUNTER — Ambulatory Visit (INDEPENDENT_AMBULATORY_CARE_PROVIDER_SITE_OTHER): Payer: Self-pay | Admitting: Internal Medicine

## 2019-09-29 ENCOUNTER — Other Ambulatory Visit: Payer: Self-pay

## 2019-09-29 VITALS — BP 146/73 | HR 73 | Temp 98.1°F | Ht 64.0 in | Wt 128.8 lb

## 2019-09-29 DIAGNOSIS — N898 Other specified noninflammatory disorders of vagina: Secondary | ICD-10-CM | POA: Insufficient documentation

## 2019-09-29 DIAGNOSIS — L608 Other nail disorders: Secondary | ICD-10-CM

## 2019-09-29 DIAGNOSIS — I447 Left bundle-branch block, unspecified: Secondary | ICD-10-CM

## 2019-09-29 DIAGNOSIS — Z7289 Other problems related to lifestyle: Secondary | ICD-10-CM

## 2019-09-29 DIAGNOSIS — I10 Essential (primary) hypertension: Secondary | ICD-10-CM

## 2019-09-29 DIAGNOSIS — Z8742 Personal history of other diseases of the female genital tract: Secondary | ICD-10-CM

## 2019-09-29 DIAGNOSIS — M79602 Pain in left arm: Secondary | ICD-10-CM

## 2019-09-29 DIAGNOSIS — Z23 Encounter for immunization: Secondary | ICD-10-CM

## 2019-09-29 DIAGNOSIS — Z9119 Patient's noncompliance with other medical treatment and regimen: Secondary | ICD-10-CM

## 2019-09-29 DIAGNOSIS — G8929 Other chronic pain: Secondary | ICD-10-CM

## 2019-09-29 DIAGNOSIS — Z9114 Patient's other noncompliance with medication regimen: Secondary | ICD-10-CM

## 2019-09-29 DIAGNOSIS — B351 Tinea unguium: Secondary | ICD-10-CM

## 2019-09-29 DIAGNOSIS — I428 Other cardiomyopathies: Secondary | ICD-10-CM

## 2019-09-29 MED ORDER — GABAPENTIN 300 MG PO CAPS: 300.0000 mg | ORAL_CAPSULE | Freq: Three times a day (TID) | ORAL | 3 refills | Status: DC

## 2019-09-29 MED ORDER — ATORVASTATIN CALCIUM 40 MG PO TABS: 40.0000 mg | ORAL_TABLET | Freq: Every day | ORAL | 6 refills | Status: DC

## 2019-09-29 MED ORDER — CARVEDILOL 6.25 MG PO TABS: 6.2500 mg | ORAL_TABLET | Freq: Two times a day (BID) | ORAL | 6 refills | Status: DC

## 2019-09-29 MED FILL — ATORVASTATIN 40 MG TABLET: 40 | 30 days supply | Qty: 30 | Fill #0

## 2019-09-29 MED FILL — GABAPENTIN 300 MG CAPSULE: 300 | 30 days supply | Qty: 90 | Fill #0

## 2019-09-29 MED FILL — CARVEDILOL 6.25 MG TABLET: 6.25 | 30 days supply | Qty: 60 | Fill #0

## 2019-09-29 NOTE — Assessment & Plan Note (Signed)
Refilled Coreg, please see A&P for NICM.

## 2019-09-29 NOTE — Assessment & Plan Note (Signed)
This is a chronic issue. I refilled her gabapentin 300 mg TID. Discussed side effects as she has been off of it for months now. She was advised to follow up with neurology in the past for for further evaluation of this but was not able to do so and lost her insurance. I advise her to apply for the orange card.

## 2019-09-29 NOTE — Assessment & Plan Note (Signed)
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  She has been off her medications for quite some time now "months." Denies chest pain, shortness of breath or DOE, orthopnea, PND, and LE swelling. She has not followed up with cardiology since 2015.  Unfortunately she is lost to follow up frequently, last time I saw her was 2 years ago. I refilled Coreg 6.25 mg BID and atorvastatin. Advised her to make a follow up appt with Cards as soon as possible.

## 2019-09-29 NOTE — Progress Notes (Signed)
   CC: Toenail discoloration and vaginal itching  HPI:  [name] is a 58 y.o. year-old female with PMH listed below who presents to clinic for toenail discoloration and vaginal itching. Please see problem based assessment and plan for further details.   Past Medical History:  Diagnosis Date  . Alcoholism ([place])   . ANEMIA, VITAMIN B12 DEFICIENCY 11/11/2007   Documented low 175 on 09/2007; received 6 yrs of IM therapy; switched to po 05/2013   . Anxiety   . Cardiac arrest ([place]) 01/16/2013   witnessed, shockable rhythm, initally low EF, no CAD at cath. Drug screen negative.  . Chest pain    a. 04/2009 Echo: EF 55-60%, Gr1 DD, Mild MR.  . Depression   . Duodenitis    secondary to H pylori(treatment completed 05/2006), +/- NSAIDS  . Duodenitis    a. 05/2006 2/2 H pylori +/- NSAIDS  . DVT (deep venous thrombosis) ([place])    a. 04/2009 - treated with coumadin x 8 mos.  Marland Kitchen ETOH abuse   . Heart murmur   . History of cocaine abuse ([place])   . History of CVA (cerebrovascular accident)   . Hypertension   . PE 05/16/2009   CT angio positive for PE in 07/10, treated with coumadin for 8 months.   . Pulmonary embolism ([place])    a. 04/2009 - treated with coumadin x 8 mos.  . Rectal bleeding 2007   internal hemmorhoids by colonoscopy in 04/2006, Bx neg for IBD  . Rectal bleeding    a. int hemorrhoids by colonoscopy in 04/2006, Bx neg for IBD  . Seizures ([place])   . Stroke ([place])   . Tobacco abuse    Review of Systems:   Review of Systems  Constitutional: Negative for chills, fever, malaise/fatigue and weight loss.  Genitourinary:       Vaginal itching and discharge     Physical Exam:  Vitals:   09/29/19 0957  BP: (!) 146/73  Pulse: 73  Temp: 98.1 F (36.7 C)  TempSrc: Oral  SpO2: 100%  Weight: 128 lb 12.8 oz (58.4 kg)  Height: 5\' 4"  (1.626 m)    Physical Exam  Constitutional:  Thin female, chronically ill-appearing, no acute distress  Genitourinary: Cervix exhibits no lesion  and no tenderness.    No vulval lesion, erythema, rash or exudate (Mild white discharge in vaginal canal ) noted.     Vaginal discharge and rugosity present.     No vaginal mucosa abnormailty.     Thin  odorless and white found.   Skin:  Yellow-brown discoloration of all toenails      Assessment & Plan:   See Encounters Tab for problem based charting.  Patient discussed with Dr. Rebeca Alert

## 2019-09-29 NOTE — Patient Instructions (Signed)
Ms. Fickett,   I refilled your heart and blood pressure medications as well last year pain medicine.  These are the [facility] outpatient pharmacy.  The vaginal itching with it a Pap smear today and a vaginal swab.  I should get these results within the next couple of days I will give you a call with the results.  Come back to see me in 6 months.  - Dr. Frederico Hamman

## 2019-09-29 NOTE — Assessment & Plan Note (Signed)
Patient presents for evaluation of vaginal itching for the past 2 weeks that is associated with white discharge. No vaginal pain or urinary symptoms.  Has a history of STIs, but last one was 30 years ago. Has not been sexually active in 5 years. Vaginal swab done today as well as pap smear. Will await results.

## 2019-09-29 NOTE — Progress Notes (Signed)
Internal Medicine Clinic Attending  Case discussed with Dr. Santos-Sanchez at the time of the visit.  We reviewed the resident's history and exam and pertinent patient test results.  I agree with the assessment, diagnosis, and plan of care documented in the resident's note.  Alexander Raines, M.D., Ph.D.  

## 2019-09-30 LAB — CYTOLOGY - PAP
Comment: NEGATIVE
Diagnosis: NEGATIVE
High risk HPV: POSITIVE — AB

## 2019-09-30 LAB — CERVICOVAGINAL ANCILLARY ONLY
Bacterial Vaginitis (gardnerella): POSITIVE — AB
Candida Glabrata: NEGATIVE
Candida Vaginitis: NEGATIVE
Chlamydia: NEGATIVE
Comment: NEGATIVE
Comment: NEGATIVE
Comment: NEGATIVE
Comment: NEGATIVE
Comment: NEGATIVE
Comment: NORMAL
Neisseria Gonorrhea: NEGATIVE
Trichomonas: POSITIVE — AB

## 2019-10-10 ENCOUNTER — Telehealth: Payer: Self-pay | Admitting: *Deleted

## 2019-10-10 ENCOUNTER — Other Ambulatory Visit: Payer: Self-pay | Admitting: Internal Medicine

## 2019-10-10 MED ORDER — METRONIDAZOLE 500 MG PO TABS: 500.0000 mg | ORAL_TABLET | Freq: Two times a day (BID) | ORAL | 0 refills | Status: DC

## 2019-10-10 MED FILL — metroNIDAZOLE 500 MG TABS: 500 | 7 days supply | Qty: 14 | Fill #0

## 2019-10-10 NOTE — Telephone Encounter (Signed)
Unable to reach pt by phone x 2.   Office is now closing for today, letter mailed to patient.  Will also attempt to reach pt again on Monday.[name], Amber Blystone Cassady12/11/202011:46 AM

## 2019-10-10 NOTE — Telephone Encounter (Signed)
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  I have tried calling her multiple times without success.  Unable to leave a voice message. Could you help me reach her? Below is the result note I left in her chart   Have called patient numerous times and have not been able to reach her. She needs repeat PAP in 1 year (due to high risk HPV and cytology negative for malignancy). She also needs treatment for trichomonas and BV with metronidazole 500 mg BID x 7 days. I sent the antibiotic to [facility] so she can afford it as she is uninsured. Will ask triage to help me contact her.   Thanks,  Duffy Bruce

## 2019-10-16 ENCOUNTER — Telehealth: Payer: Self-pay | Admitting: Internal Medicine

## 2019-10-16 NOTE — Telephone Encounter (Signed)
PLs contact pt [phone] regarding last visit

## 2019-10-16 NOTE — Telephone Encounter (Signed)
Thank you :)

## 2019-10-16 NOTE — Telephone Encounter (Signed)
t called back, gave her dr Isac Sarna message plus stressed not to use alcohol while taking the med Partner needs to be treated and until both have been treated successfully to use condoms She verbalized understanding

## 2019-12-08 ENCOUNTER — Telehealth: Payer: Self-pay | Admitting: Internal Medicine

## 2019-12-08 NOTE — Telephone Encounter (Signed)
Patient requesting a call back about medication for mood swings.  Pt also sch appt with [facility] for  tomorrow 12/09/2019 @ 9:45 am.

## 2019-12-08 NOTE — Telephone Encounter (Signed)
Called pt, she states could the md just give her something for mood swings without being seen, she is informed no, she would need to keep appt and discuss it at the appt, she is agreeable

## 2019-12-09 ENCOUNTER — Ambulatory Visit: Payer: Self-pay

## 2019-12-09 ENCOUNTER — Encounter: Payer: Self-pay | Admitting: Internal Medicine

## 2020-02-23 ENCOUNTER — Encounter: Payer: Self-pay | Admitting: *Deleted

## 2020-05-05 ENCOUNTER — Ambulatory Visit: Payer: Self-pay | Admitting: Student

## 2020-05-05 ENCOUNTER — Encounter: Payer: Self-pay | Admitting: Student

## 2020-05-05 ENCOUNTER — Other Ambulatory Visit: Payer: Self-pay

## 2020-05-05 VITALS — BP 120/70 | HR 72 | Temp 98.7°F | Ht 64.0 in | Wt 115.5 lb

## 2020-05-05 DIAGNOSIS — I10 Essential (primary) hypertension: Secondary | ICD-10-CM

## 2020-05-05 DIAGNOSIS — F1910 Other psychoactive substance abuse, uncomplicated: Secondary | ICD-10-CM

## 2020-05-05 DIAGNOSIS — D519 Vitamin B12 deficiency anemia, unspecified: Secondary | ICD-10-CM

## 2020-05-05 DIAGNOSIS — R059 Cough, unspecified: Secondary | ICD-10-CM

## 2020-05-05 DIAGNOSIS — R7401 Elevation of levels of liver transaminase levels: Secondary | ICD-10-CM

## 2020-05-05 DIAGNOSIS — I428 Other cardiomyopathies: Secondary | ICD-10-CM

## 2020-05-05 DIAGNOSIS — M79671 Pain in right foot: Secondary | ICD-10-CM

## 2020-05-05 DIAGNOSIS — D649 Anemia, unspecified: Secondary | ICD-10-CM

## 2020-05-05 HISTORY — DX: Pain in right foot: M79.671

## 2020-05-05 MED ORDER — THIAMINE HCL 100 MG PO TABS: 100.0000 mg | ORAL_TABLET | Freq: Every day | ORAL | 6 refills | Status: DC

## 2020-05-05 MED ORDER — FUROSEMIDE 20 MG PO TABS: 20.0000 mg | ORAL_TABLET | Freq: Every day | ORAL | 1 refills | Status: DC

## 2020-05-05 MED ORDER — ATORVASTATIN CALCIUM 40 MG PO TABS: 40.0000 mg | ORAL_TABLET | Freq: Every day | ORAL | 6 refills | Status: DC

## 2020-05-05 MED ORDER — IBUPROFEN 200 MG PO TABS: 400.0000 mg | ORAL_TABLET | Freq: Four times a day (QID) | ORAL | 2 refills | Status: AC | PRN

## 2020-05-05 MED ORDER — OMEPRAZOLE 20 MG PO CPDR: 20.0000 mg | DELAYED_RELEASE_CAPSULE | Freq: Every day | ORAL | 3 refills | Status: DC

## 2020-05-05 MED ORDER — NITROGLYCERIN 0.4 MG SL SUBL: 0.4000 mg | SUBLINGUAL_TABLET | SUBLINGUAL | 1 refills | Status: DC | PRN

## 2020-05-05 MED ORDER — IBUPROFEN 200 MG PO TABS: 400.0000 mg | ORAL_TABLET | Freq: Four times a day (QID) | ORAL | 2 refills | Status: DC | PRN

## 2020-05-05 MED FILL — OMEPRAZOLE DR 20 MG CAPSULE: 20 | 30 days supply | Qty: 30 | Fill #0

## 2020-05-05 MED FILL — NITROGLYCERIN 0.4 MG TAB SL: 0.4 | 5 days supply | Qty: 25 | Fill #0

## 2020-05-05 MED FILL — FUROSEMIDE 20 MG TABS: 20 | 30 days supply | Qty: 30 | Fill #0

## 2020-05-05 MED FILL — ATORVASTATIN 40 MG TABLET: 40 | 30 days supply | Qty: 30 | Fill #0

## 2020-05-05 NOTE — Assessment & Plan Note (Signed)
A: Patient has experienced intermittent, cramping right foot pain, worst over the first metatarsal head, over the past 3 weeks.  She has a history of gout and radiculopathy in her arm, but states her pain feels less "burning" in nature and less acute than her usual gout flares.  She has alcohol use disorder and continues to drink heavily, so her pain may be related to osteoporosis or other bony process.  P: - Will taper off of gabapentin given that it is not helping her pain.  She has been taking 7 to 8 300mg  pills daily, so advised her to take her prescribed 300mg  pill TID and will wean off over time. - Will get xray of the right foot to rule out osteoarthritis vs. Fracture vs. Other bony abnormality. - Started patient on ibuprofen 400mg  q6 hours. Will have patient follow-up in 3 weeks.  Will consider starting patient on Allopurinol at next visit if ibuprofen successfully treats her pain. - ABI performed given history of cardiac arrest and multiple cardiac risk factors (history of HTN and HLD). Results were WNL (~1.0).

## 2020-05-05 NOTE — Progress Notes (Signed)
CC: Right foot pain  HPI:  [name] is a 59 y.o. woman presenting with intermittent, 7/10 right foot pain that has remained constant over the past 3 weeks.  Her pain is worst at the base of her right toe, although she notes diffuse pain about her entire right foot. She describes her pain as a cramping pain that worsens with prolonged sitting and improves with standing.  Her pain is not made worse by exertion.  She notes a history of gout but states her gout pain is usually more acute and "burning".  She also notes a history of fungal infection on her feet with chronic purple discoloration.  She denies any redness or new discoloration, swelling, numbness, or tingling to her lower extremity.  She denies fevers, chills, back pain, chest pain, or any other symptoms.  She notes she has been prescribed gabapentin for upper extremity radiculopathy but states she has been taking 7-8 tablets per day this nor OTC NSAIDs have improved her pain at home.   Past Medical History:  Diagnosis Date   Alcoholism (HCC)    ANEMIA, VITAMIN B12 DEFICIENCY 11/11/2007   Documented low 175 on 09/2007; received 6 yrs of IM therapy; switched to po 05/2013    Anxiety    Cardiac arrest (HCC) 01/16/2013   witnessed, shockable rhythm, initally low EF, no CAD at cath. Drug screen negative.   Chest pain    a. 04/2009 Echo: EF 55-60%, Gr1 DD, Mild MR.   Depression    Duodenitis    secondary to H pylori(treatment completed 05/2006), +/- NSAIDS   Duodenitis    a. 05/2006 2/2 H pylori +/- NSAIDS   DVT (deep venous thrombosis) (HCC)    a. 04/2009 - treated with coumadin x 8 mos.   ETOH abuse    Heart murmur    History of cocaine abuse (HCC)    History of CVA (cerebrovascular accident)    Hypertension    PE 05/16/2009   CT angio positive for PE in 07/10, treated with coumadin for 8 months.    Pulmonary embolism (HCC)    a. 04/2009 - treated with coumadin x 8 mos.   Rectal bleeding 2007   internal  hemmorhoids by colonoscopy in 04/2006, Bx neg for IBD   Rectal bleeding    a. int hemorrhoids by colonoscopy in 04/2006, Bx neg for IBD   Seizures (HCC)    Stroke (HCC)    Tobacco abuse    Review of Systems:  All others negative except as noted in HPI.  PSHx: Patient states she drinks a 6-pack of beer daily and uses marijuana regularly.  She states she has stopped using cocaine and denies any other drug use.  She continues to smoke 2-3 cigarettes per day and has smoked since age 46.  She is uninsured and lives at home with her 30 year old friend.  Allergies - Omnipaque (seizures)   Vitals:   05/05/20 1438  BP: 120/70  Pulse: 72  Temp: 98.7 F (37.1 C)  TempSrc: Oral  SpO2: 100%  Weight: 115 lb 8 oz (52.4 kg)  Height: 5\' 4"  (1.626 m)   Physical Exam: Constitutional: Patient appears well. No acute distress. Eyes: No conjunctival injection. Sclera non-icteric.  HENT: Moist mucus membranes.  Respiratory: Lungs are clear to auscultation, bilaterally. No wheezes, rales, or rhonchi. Cardiovascular: Regular rate and rhythm. No murmurs, rubs, or gallops.  DP and PT pulses are 2+, bilaterally.  No lower extremity edema. Neurological: Sensation overlying L3-L5 dermatomes of  the lower extremities is intact, bilaterally.  Skin: Purple discoloration is present overlying several toes of the right lower extremity.  There is non-erythematous scaling of the pedal surface of both feet with onychomycosis of the toes.  Assessment & Plan:   See Encounters Tab for problem based charting. Note - patient's hypertension has resolved.  Patient has not taken her amlodipine for as long as she can remember and blood pressure today was 120/70.  Patient seen with Dr. Orvan Falconer, PGY1 [facility]  Pager: [phone]

## 2020-05-05 NOTE — Assessment & Plan Note (Signed)
A: Patient has NICM with LBBB, likely secondary to chronic alcohol abuse.  She had an episode of cardiac arrest in 2014.  Last ECHO performed 09/20/15 shows mild systolic heart failure with reduced EF (45-50%) by LV-gram and no evidence of CAD.  She was last seen in the [facility] in 2017.  She requires refills of her NTG, Lasix, and atorvastatin.  She denies any lower extremity edema, CP, or other concerning symptoms at this time.  P: Refilled NTG, Lasix and Atorvastatin.   - Patient will require repeat echocardiogram at a later date after her acute concerns are addressed.

## 2020-05-05 NOTE — Assessment & Plan Note (Addendum)
A: Patient states that she currently drinks a 6-pack of beer every day and uses marijuana, regularly.  She states she has cut back on her smoking to 2-3 cigarettes per day and has smoked since age 59.  She states that she no longer uses cocaine. Patient's alcohol use disorder may be contributing to her foot pain symptoms today given her reported history of gout.  P: Discussed the benefits of decreasing alcohol and smoking use with the patient.  Will have a more formal discussion regarding alcohol cessation and consider the possibility of Naltrexone therapy with the patient at her follow-up visit. - Ordered CMP and CBC w/ diff to r/o thrombocytopenia and hypoalbuminemia as early signs of cirrhosis.

## 2020-05-05 NOTE — Assessment & Plan Note (Signed)
A: Patient denies any light-headedness or fatigue at this visit.  She has a history of vitamin B12 deficiency for which she takes OTC vitamin supplements.  P: Will check CBC with differential.

## 2020-05-05 NOTE — Patient Instructions (Signed)
Today, we discussed that your foot pain may be due to a number of conditions, including gout, osteoporosis/fracture, or other causes.  Please decrease your gabapentin usage to 3 pills per day, and please take 2 tablets of ibuprofen every 6 hours this week, as this could improve your foot pain.  Please get an xray upon checkout today to rule out fracture.  Please follow up in 3 weeks with me in clinic (before the end of July).   Gout  Gout is painful swelling of your joints. Gout is a type of arthritis. It is caused by having too much uric acid in your body. Uric acid is a chemical that is made when your body breaks down substances called purines. If your body has too much uric acid, sharp crystals can form and build up in your joints. This causes pain and swelling. Gout attacks can happen quickly and be very painful (acute gout). Over time, the attacks can affect more joints and happen more often (chronic gout). What are the causes?  Too much uric acid in your blood. This can happen because: ? Your kidneys do not remove enough uric acid from your blood. ? Your body makes too much uric acid. ? You eat too many foods that are high in purines. These foods include organ meats, some seafood, and beer.  Trauma or stress. What increases the risk?  Having a family history of gout.  Being female and middle-aged.  Being female and having gone through menopause.  Being very overweight (obese).  Drinking alcohol, especially beer.  Not having enough water in the body (being dehydrated).  Losing weight too quickly.  Having an organ transplant.  Having lead poisoning.  Taking certain medicines.  Having kidney disease.  Having a skin condition called psoriasis. What are the signs or symptoms? An attack of acute gout usually happens in just one joint. The most common place is the big toe. Attacks often start at night. Other joints that may be affected include joints of the feet, ankle, knee,  fingers, wrist, or elbow. Symptoms of an attack may include:  Very bad pain.  Warmth.  Swelling.  Stiffness.  Shiny, red, or purple skin.  Tenderness. The affected joint may be very painful to touch.  Chills and fever. Chronic gout may cause symptoms more often. More joints may be involved. You may also have white or yellow lumps (tophi) on your hands or feet or in other areas near your joints. How is this treated?  Treatment for this condition has two phases: treating an acute attack and preventing future attacks.  Acute gout treatment may include: ? NSAIDs. ? Steroids. These are taken by mouth or injected into a joint. ? Colchicine. This medicine relieves pain and swelling. It can be given by mouth or through an IV tube.  Preventive treatment may include: ? Taking small doses of NSAIDs or colchicine daily. ? Using a medicine that reduces uric acid levels in your blood. ? Making changes to your diet. You may need to see a food expert (dietitian) about what to eat and drink to prevent gout. Follow these instructions at home: During a gout attack   If told, put ice on the painful area: ? Put ice in a plastic bag. ? Place a towel between your skin and the bag. ? Leave the ice on for 20 minutes, 2-3 times a day.  Raise (elevate) the painful joint above the level of your heart as often as you can.  Rest the  joint as much as possible. If the joint is in your leg, you may be given crutches.  Follow instructions from your doctor about what you cannot eat or drink. Avoiding future gout attacks  Eat a low-purine diet. Avoid foods and drinks such as: ? Liver. ? Kidney. ? Anchovies. ? Asparagus. ? Herring. ? Mushrooms. ? Mussels. ? Beer.  Stay at a healthy weight. If you want to lose weight, talk with your doctor. Do not lose weight too fast.  Start or continue an exercise plan as told by your doctor. Eating and drinking  Drink enough fluids to keep your pee (urine)  pale yellow.  If you drink alcohol: ? Limit how much you use to:  0-1 drink a day for women.  0-2 drinks a day for men. ? Be aware of how much alcohol is in your drink. In the [place] one drink equals one 12 oz bottle of beer (355 mL), one 5 oz glass of wine (148 mL), or one 1 oz glass of hard liquor (44 mL). General instructions  Take over-the-counter and prescription medicines only as told by your doctor.  Do not drive or use heavy machinery while taking prescription pain medicine.  Return to your normal activities as told by your doctor. Ask your doctor what activities are safe for you.  Keep all follow-up visits as told by your doctor. This is important. Contact a doctor if:  You have another gout attack.  You still have symptoms of a gout attack after 10 days of treatment.  You have problems (side effects) because of your medicines.  You have chills or a fever.  You have burning pain when you pee (urinate).  You have pain in your lower back or belly. Get help right away if:  You have very bad pain.  Your pain cannot be controlled.  You cannot pee. Summary  Gout is painful swelling of the joints.  The most common site of pain is the big toe, but it can affect other joints.  Medicines and avoiding some foods can help to prevent and treat gout attacks. This information is not intended to replace advice given to you by your health care provider. Make sure you discuss any questions you have with your health care provider. Document Revised: 05/08/2018 Document Reviewed: 05/08/2018 Elsevier Patient Education  2020 [employer].

## 2020-05-06 ENCOUNTER — Telehealth: Payer: Self-pay | Admitting: Student

## 2020-05-06 LAB — CMP14 + ANION GAP
ALT: 19 IU/L (ref 0–32)
AST: 32 IU/L (ref 0–40)
Albumin/Globulin Ratio: 1.8 (ref 1.2–2.2)
Albumin: 4.4 g/dL (ref 3.8–4.9)
Alkaline Phosphatase: 76 IU/L (ref 48–121)
Anion Gap: 12 mmol/L (ref 10.0–18.0)
BUN/Creatinine Ratio: 13 (ref 9–23)
BUN: 8 mg/dL (ref 6–24)
Bilirubin Total: 0.3 mg/dL (ref 0.0–1.2)
CO2: 24 mmol/L (ref 20–29)
Calcium: 9 mg/dL (ref 8.7–10.2)
Chloride: 105 mmol/L (ref 96–106)
Creatinine, Ser: 0.62 mg/dL (ref 0.57–1.00)
GFR calc Af Amer: 115 mL/min/{1.73_m2} (ref >59)
GFR calc non Af Amer: 100 mL/min/{1.73_m2} (ref >59)
Globulin, Total: 2.5 g/dL (ref 1.5–4.5)
Glucose: 91 mg/dL (ref 65–99)
Potassium: 3.9 mmol/L (ref 3.5–5.2)
Sodium: 141 mmol/L (ref 134–144)
Total Protein: 6.9 g/dL (ref 6.0–8.5)

## 2020-05-06 LAB — CBC WITH DIFFERENTIAL/PLATELET
Basophils Absolute: 0 10*3/uL (ref 0.0–0.2)
Basos: 0 %
EOS (ABSOLUTE): 0.1 10*3/uL (ref 0.0–0.4)
Eos: 1 %
Hematocrit: 33.9 % — ABNORMAL LOW (ref 34.0–46.6)
Hemoglobin: 11.1 g/dL (ref 11.1–15.9)
Immature Grans (Abs): 0 10*3/uL (ref 0.0–0.1)
Immature Granulocytes: 0 %
Lymphocytes Absolute: 2.7 10*3/uL (ref 0.7–3.1)
Lymphs: 30 %
MCH: 30 pg (ref 26.6–33.0)
MCHC: 32.7 g/dL (ref 31.5–35.7)
MCV: 92 fL (ref 79–97)
Monocytes Absolute: 0.7 10*3/uL (ref 0.1–0.9)
Monocytes: 7 %
Neutrophils Absolute: 5.6 10*3/uL (ref 1.4–7.0)
Neutrophils: 62 %
Platelets: 296 10*3/uL (ref 150–450)
RBC: 3.7 x10E6/uL — ABNORMAL LOW (ref 3.77–5.28)
RDW: 12.2 % (ref 11.7–15.4)
WBC: 9.2 10*3/uL (ref 3.4–10.8)

## 2020-05-06 LAB — LIPID PANEL
Chol/HDL Ratio: 2.1 ratio (ref 0.0–4.4)
Cholesterol, Total: 191 mg/dL (ref 100–199)
HDL: 93 mg/dL (ref >39)
LDL Chol Calc (NIH): 84 mg/dL (ref 0–99)
Triglycerides: 80 mg/dL (ref 0–149)
VLDL Cholesterol Cal: 14 mg/dL (ref 5–40)

## 2020-05-06 NOTE — Progress Notes (Signed)
Internal Medicine Clinic Attending  I saw and evaluated the patient.  I personally confirmed the key portions of the history and exam documented by Dr. Laddie Aquas and I reviewed pertinent patient test results.  The assessment, diagnosis, and plan were formulated together and I agree with the documentation in the resident's note.  Right foot pain for 3 weeks that is centered around the first MTP joint. History of claudication, but ABIs here in clinic are normal. 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 Will treat with NSAIDs, once flair resolves we can discuss uric acid lowering therapy. Would like to see better follow up and start on treatment for alcohol use disorder soon.

## 2020-05-06 NOTE — Telephone Encounter (Signed)
Spoke with [name] about her lab results.  CBC shows stable normocytic anemia. Patient states she continues to take OTC B12.  Her creatinine has improved from 1.10 on 07/01/17 to 0.62 on 05/05/20. Remainder of CMP and lipid profile are WNL. No thrombocytopenia or hypoalbuminemia to suggest cirrhosis.  Patient picked up her ibuprofen and states it is improving her foot/toe pain.  She states she will get her foot XR tomorrow morning.  Will follow up results.  No concerns at this time.  Glenford Bayley, PGY1 Internal Medicine  [phone]

## 2020-05-26 ENCOUNTER — Encounter: Payer: Self-pay | Admitting: Student

## 2020-05-28 ENCOUNTER — Encounter: Payer: Self-pay | Admitting: Student

## 2020-05-28 ENCOUNTER — Ambulatory Visit: Payer: Self-pay | Admitting: Student

## 2020-05-28 VITALS — BP 129/72 | HR 82 | Temp 98.9°F | Ht 64.0 in | Wt 111.9 lb

## 2020-05-28 DIAGNOSIS — R0602 Shortness of breath: Secondary | ICD-10-CM | POA: Insufficient documentation

## 2020-05-28 DIAGNOSIS — D649 Anemia, unspecified: Secondary | ICD-10-CM

## 2020-05-28 DIAGNOSIS — R059 Cough, unspecified: Secondary | ICD-10-CM

## 2020-05-28 DIAGNOSIS — M109 Gout, unspecified: Secondary | ICD-10-CM

## 2020-05-28 DIAGNOSIS — R7401 Elevation of levels of liver transaminase levels: Secondary | ICD-10-CM

## 2020-05-28 DIAGNOSIS — D500 Iron deficiency anemia secondary to blood loss (chronic): Secondary | ICD-10-CM

## 2020-05-28 DIAGNOSIS — E611 Iron deficiency: Secondary | ICD-10-CM

## 2020-05-28 DIAGNOSIS — M1A9XX1 Chronic gout, unspecified, with tophus (tophi): Secondary | ICD-10-CM

## 2020-05-28 DIAGNOSIS — F329 Major depressive disorder, single episode, unspecified: Secondary | ICD-10-CM

## 2020-05-28 DIAGNOSIS — F1910 Other psychoactive substance abuse, uncomplicated: Secondary | ICD-10-CM

## 2020-05-28 DIAGNOSIS — R05 Cough: Secondary | ICD-10-CM

## 2020-05-28 DIAGNOSIS — F319 Bipolar disorder, unspecified: Secondary | ICD-10-CM

## 2020-05-28 DIAGNOSIS — F32A Depression, unspecified: Secondary | ICD-10-CM

## 2020-05-28 HISTORY — DX: Shortness of breath: R06.02

## 2020-05-28 HISTORY — DX: Gout, unspecified: M10.9

## 2020-05-28 MED ORDER — DEXTROMETHORPHAN-GUAIFENESIN 20-400 MG PO TABS: 5.0000 mL | ORAL_TABLET | ORAL | 0 refills | Status: DC | PRN

## 2020-05-28 NOTE — Assessment & Plan Note (Signed)
A: Patient has a history of alcohol, cocaine, and tobacco use disorders. She states that she doesn't drink liquor but drinks beer regularly and continues to smoke, although she said her recent SOB has prevented her from doing either. She denies any withdrawal symptoms at this time.   P: Will continue to have discussions regarding cessation.

## 2020-05-28 NOTE — Assessment & Plan Note (Signed)
A: Patient has a history of normocytic anemia in the past with decreased iron saturations in 2019.   P: Check iron studies and ferritin and replace iron as needed. Will need CBC checked at future visit.
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
## 2020-05-28 NOTE — Assessment & Plan Note (Signed)
A: Patient endorses 1 week of improving shortness of breath and productive cough. She states she first noticed a sore throat and sinus pressure but then developed a deeper cold. She endorses associated mild ear pain (which she believes is due to a sore tooth) and decreased appetite with recent weight loss but denies any fevers or chills. She received both COVID-19 vaccines. Granddaughter recently sick with the same symptoms, most consistent with a viral illness other than COVID-19.   P: Continue supportive care - encouraged fluid and oral intake.  - Prescribed dextromethorphan-guaifenesin tablets for her to take q4 hours as needed for cough and congestion.

## 2020-05-28 NOTE — Assessment & Plan Note (Addendum)
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 She states she gets gouty flares once every other month but has never been on uric acid-lowering therapy. She her pain resolved following regular ibuprofen use. XR of the foot was ordered although patient did not complete this study.  P: Continued to encourage alcohol cessation. - Patient made aware of foods which cause flares and is actively trying to avoid them.  - Will check uric acid levels. If elevated, will initiate allopurinol and continue ibuprofen use during flares.

## 2020-05-28 NOTE — Patient Instructions (Addendum)
Today, we discussed that your cough and shortness of breath may be due to a viral illness. I have prescribed a cough medicine to take every 4 hours as needed (71mL each time).    We will check your iron levels and your uric acid levels. I will call you with results and we will treat your gout accordingly.   I will refer you to psychiatry and will also refer you to counseling here. You should receive a call in the next week or so to schedule these appointments.  Please make an appointment if your symptoms do not improve or worsen. Otherwise, please schedule an appointment in 3 months.  Thank you,  Dr. Glenford Bayley   Gout  Gout is a condition that causes painful swelling of the joints. Gout is a type of inflammation of the joints (arthritis). This condition is caused by having too much uric acid in the body. Uric acid is a chemical that forms when the body breaks down substances called purines. Purines are important for building body proteins. When the body has too much uric acid, sharp crystals can form and build up inside the joints. This causes pain and swelling. Gout attacks can happen quickly and may be very painful (acute gout). Over time, the attacks can affect more joints and become more frequent (chronic gout). Gout can also cause uric acid to build up under the skin and inside the kidneys. What are the causes? This condition is caused by too much uric acid in your blood. This can happen because:  Your kidneys do not remove enough uric acid from your blood. This is the most common cause.  Your body makes too much uric acid. This can happen with some cancers and cancer treatments. It can also occur if your body is breaking down too many red blood cells (hemolytic anemia).  You eat too many foods that are high in purines. These foods include organ meats and some seafood. Alcohol, especially beer, is also high in purines. A gout attack may be triggered by trauma or stress. What  increases the risk? You are more likely to develop this condition if you:  Have a family history of gout.  Are female and middle-aged.  Are female and have gone through menopause.  Are obese.  Frequently drink alcohol, especially beer.  Are dehydrated.  Lose weight too quickly.  Have an organ transplant.  Have lead poisoning.  Take certain medicines, including aspirin, cyclosporine, diuretics, levodopa, and niacin.  Have kidney disease.  Have a skin condition called psoriasis. What are the signs or symptoms? An attack of acute gout happens quickly. It usually occurs in just one joint. The most common place is the big toe. Attacks often start at night. Other joints that may be affected include joints of the feet, ankle, knee, fingers, wrist, or elbow. Symptoms of this condition may include:  Severe pain.  Warmth.  Swelling.  Stiffness.  Tenderness. The affected joint may be very painful to touch.  Shiny, red, or purple skin.  Chills and fever. Chronic gout may cause symptoms more frequently. More joints may be involved. You may also have white or yellow lumps (tophi) on your hands or feet or in other areas near your joints. How is this diagnosed? This condition is diagnosed based on your symptoms, medical history, and physical exam. You may have tests, such as:  Blood tests to measure uric acid levels.  Removal of joint fluid with a thin needle (aspiration) to look for uric  acid crystals.  X-rays to look for joint damage. How is this treated? Treatment for this condition has two phases: treating an acute attack and preventing future attacks. Acute gout treatment may include medicines to reduce pain and swelling, including:  NSAIDs.  Steroids. These are strong anti-inflammatory medicines that can be taken by mouth (orally) or injected into a joint.  Colchicine. This medicine relieves pain and swelling when it is taken soon after an attack. It can be given by  mouth or through an IV. Preventive treatment may include:  Daily use of smaller doses of NSAIDs or colchicine.  Use of a medicine that reduces uric acid levels in your blood.  Changes to your diet. You may need to see a dietitian about what to eat and drink to prevent gout. Follow these instructions at home: During a gout attack   If directed, put ice on the affected area: ? Put ice in a plastic bag. ? Place a towel between your skin and the bag. ? Leave the ice on for 20 minutes, 2-3 times a day.  Raise (elevate) the affected joint above the level of your heart as often as possible.  Rest the joint as much as possible. If the affected joint is in your leg, you may be given crutches to use.  Follow instructions from your health care provider about eating or drinking restrictions. Avoiding future gout attacks  Follow a low-purine diet as told by your dietitian or health care provider. Avoid foods and drinks that are high in purines, including liver, kidney, anchovies, asparagus, herring, mushrooms, mussels, and beer.  Maintain a healthy weight or lose weight if you are overweight. If you want to lose weight, talk with your health care provider. It is important that you do not lose weight too quickly.  Start or maintain an exercise program as told by your health care provider. Eating and drinking  Drink enough fluids to keep your urine pale yellow.  If you drink alcohol: ? Limit how much you use to:  0-1 drink a day for women.  0-2 drinks a day for men. ? Be aware of how much alcohol is in your drink. In the [place] one drink equals one 12 oz bottle of beer (355 mL) one 5 oz glass of wine (148 mL), or one 1 oz glass of hard liquor (44 mL). General instructions  Take over-the-counter and prescription medicines only as told by your health care provider.  Do not drive or use heavy machinery while taking prescription pain medicine.  Return to your normal activities as told by  your health care provider. Ask your health care provider what activities are safe for you.  Keep all follow-up visits as told by your health care provider. This is important. Contact a health care provider if you have:  Another gout attack.  Continuing symptoms of a gout attack after 10 days of treatment.  Side effects from your medicines.  Chills or a fever.  Burning pain when you urinate.  Pain in your lower back or belly. Get help right away if you:  Have severe or uncontrolled pain.  Cannot urinate. Summary  Gout is painful swelling of the joints caused by inflammation.  The most common site of pain is the big toe, but it can affect other joints in the body.  Medicines and dietary changes can help to prevent and treat gout attacks. This information is not intended to replace advice given to you by your health care provider. Make sure  you discuss any questions you have with your health care provider. Document Revised: 05/08/2018 Document Reviewed: 05/08/2018 Elsevier Patient Education  2020 [employer].

## 2020-05-29 LAB — IRON AND TIBC
Iron Saturation: 22 % (ref 15–55)
Iron: 45 ug/dL (ref 27–159)
Total Iron Binding Capacity: 207 ug/dL — ABNORMAL LOW (ref 250–450)
UIBC: 162 ug/dL (ref 131–425)

## 2020-05-29 LAB — FERRITIN: Ferritin: 597 ng/mL — ABNORMAL HIGH (ref 15–150)

## 2020-05-29 LAB — URIC ACID: Uric Acid: 7.4 mg/dL — ABNORMAL HIGH (ref 3.0–7.2)

## 2020-05-31 ENCOUNTER — Telehealth: Payer: Self-pay | Admitting: Student

## 2020-05-31 NOTE — Progress Notes (Signed)
Internal Medicine Clinic Attending  I saw and evaluated the patient.  I personally confirmed the key portions of the history and exam documented by Dr. Speakman and I reviewed pertinent patient test results.  The assessment, diagnosis, and plan were formulated together and I agree with the documentation in the resident's note.  

## 2020-05-31 NOTE — Telephone Encounter (Signed)
Attempted to call patient to discuss lab results, but no answer/voicemail full. Will plan to continue lifestyle modifications with ibuprofen PRN for gout flares given uric acid 7.4 and see how often [name] experiences symptoms. Will try calling back later.  Glenford Bayley, PGY1 Internal Medicine Pager: [phone]

## 2020-06-02 ENCOUNTER — Telehealth: Payer: Self-pay | Admitting: Student

## 2020-06-02 NOTE — Telephone Encounter (Signed)
Discussed results with patient and informed her we will treat her gout flares with ibuprofen for now and instructed her to keep track of how many flares she is having. Cough is better and she has not yet heard from psychiatry. No questions or concerns at this time.  Glenford Bayley, PGY1 Internal Medicine Pager: [phone]

## 2020-06-08 ENCOUNTER — Encounter: Payer: Self-pay | Admitting: Licensed Clinical Social Worker

## 2021-02-24 ENCOUNTER — Encounter: Payer: Self-pay | Admitting: Student

## 2021-02-24 ENCOUNTER — Telehealth: Payer: Self-pay | Admitting: *Deleted

## 2021-02-24 NOTE — Telephone Encounter (Signed)
CALLED PATIENT REGARDING HER MISSES APPOINTMENT/ UNABLE TO LEAVE MESSAGE, PHONE NUMBER IS NOT IN SERVICE,

## 2021-03-24 ENCOUNTER — Encounter: Payer: Self-pay | Admitting: *Deleted

## 2021-06-02 ENCOUNTER — Encounter: Payer: Self-pay | Admitting: Internal Medicine

## 2021-06-02 ENCOUNTER — Other Ambulatory Visit: Payer: Self-pay

## 2021-06-02 ENCOUNTER — Ambulatory Visit (INDEPENDENT_AMBULATORY_CARE_PROVIDER_SITE_OTHER): Payer: Self-pay | Admitting: Internal Medicine

## 2021-06-02 VITALS — BP 118/84 | HR 74 | Temp 98.6°F | Ht 63.0 in | Wt 109.3 lb

## 2021-06-02 DIAGNOSIS — N644 Mastodynia: Secondary | ICD-10-CM | POA: Insufficient documentation

## 2021-06-02 HISTORY — DX: Mastodynia: N64.4

## 2021-06-02 NOTE — Patient Instructions (Signed)
It was a pleasure seeing you today.  I am concerned about your left breast pain and drainage.  I have ordered a stat ultrasound as well as mammography.  We should hopefully get this scheduled for you as early as next week.  If you do not hear back from anyone by mid next week please give me a call back.

## 2021-06-02 NOTE — Assessment & Plan Note (Signed)
Patient endorses left breast pain for the past week.  She notices drainage that started yesterday, describes this as puslike.  Denies any trauma or injury to the left breast.  States this is never happened before.  She does have a family history of breast cancer in both her mother, sister and niece.  States that they all had mastectomies.  She endorses night sweats which she attributed to menopause.  She does endorse 15 pound weight loss over the past 3 months.  Denies any fevers, nausea, chills. Denies any history of abnormal mammograms.  On exam the left nipple is inflamed and cracked around the lateral surface, appears abrasive and clear drainage is notable.  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  Ordered stat left diagnostic mammogram and left breast ultrasound.

## 2021-06-02 NOTE — Progress Notes (Signed)
   CC: Left breast pain and drainage  HPI:  [name] is a 59 y.o. with a past medical history listed below presenting for evaluation of left breast pain and nipple drainage. For details of today's visit and the status of his chronic medical issues please refer to the assessment and plan.   Past Medical History:  Diagnosis Date   Alcoholism (HCC)    ANEMIA, VITAMIN B12 DEFICIENCY 11/11/2007   Documented low 175 on 09/2007; received 6 yrs of IM therapy; switched to po 05/2013    Anxiety    Cardiac arrest (HCC) 01/16/2013   witnessed, shockable rhythm, initally low EF, no CAD at cath. Drug screen negative.   Chest pain    a. 04/2009 Echo: EF 55-60%, Gr1 DD, Mild MR.   Depression    Duodenitis    secondary to H pylori(treatment completed 05/2006), +/- NSAIDS   Duodenitis    a. 05/2006 2/2 H pylori +/- NSAIDS   DVT (deep venous thrombosis) (HCC)    a. 04/2009 - treated with coumadin x 8 mos.   ETOH abuse    Heart murmur    History of cocaine abuse (HCC)    History of CVA (cerebrovascular accident)    Hypertension    PE 05/16/2009   CT angio positive for PE in 07/10, treated with coumadin for 8 months.    Pulmonary embolism (HCC)    a. 04/2009 - treated with coumadin x 8 mos.   Rectal bleeding 2007   internal hemmorhoids by colonoscopy in 04/2006, Bx neg for IBD   Rectal bleeding    a. int hemorrhoids by colonoscopy in 04/2006, Bx neg for IBD   Seizures (HCC)    Stroke (HCC)    Tobacco abuse    Review of Systems:   Negative except as per assessment and plan.  Physical Exam:  Vitals:   06/02/21 1502  BP: 118/84  Pulse: 74  Temp: 98.6 F (37 C)  TempSrc: Oral  SpO2: 98%  Weight: 109 lb 4.8 oz (49.6 kg)  Height: 5\' 3"  (1.6 m)   Physical Exam General: alert, appears stated age, in no acute distress HEENT: Normocephalic, atraumatic, EOM intact, conjunctiva normal CV: Regular rate and rhythm, no murmurs rubs or gallops Pulm: Clear to auscultation bilaterally,  normal work of breathing Abdomen: Soft, nondistended, bowel sounds present, no tenderness to palpation MSK: No lower extremity edema Skin: Left nipple appears inflamed, irritated and cracked around most of the lateral surface, clear drainage noted, tender to palpation, no lumps appreciated, no axillary lymphadenopathy appreciated Neuro: Alert and oriented x3   Assessment & Plan:   See Encounters Tab for problem based charting.  Patient discussed with Dr. 

## 2021-07-18 ENCOUNTER — Ambulatory Visit: Payer: Self-pay

## 2021-07-18 ENCOUNTER — Other Ambulatory Visit: Payer: Self-pay

## 2021-08-01 ENCOUNTER — Other Ambulatory Visit: Payer: Self-pay

## 2021-08-01 ENCOUNTER — Telehealth: Payer: Self-pay

## 2021-08-01 DIAGNOSIS — N6452 Nipple discharge: Secondary | ICD-10-CM

## 2021-08-01 DIAGNOSIS — N644 Mastodynia: Secondary | ICD-10-CM

## 2021-08-01 NOTE — Telephone Encounter (Signed)
Patient called and stated that she has bloody discharge and pain in the left breast x 1.5 months. Patient states the discharge has increased,notices more discharge with sweating, and the pain is worse, has become constant. Patient denies having any lumps, skin changes, nipple inversion. Discharge occurs sporatically. Patient initially saw Dr. Karilyn Cota (Internal Medicine) on 06/02/2021 of left breast pain and discharge, was sent for mammogram and ultrasound, but has no insurance, could not afford services. Per Dr. Patty Sermons 06/02/2021 ov notes, discharge was initially clear, 15 lb weight loss, and patient does have family history of breast cancer-mother, sister, niece. Patient was offered and accepted [facility] appointment for 08/02/2021 @ 11:15 am and appointment at the [facility] @ 2:00pm. Both appointments scheduled per Fonnie Mu, RN.

## 2021-08-02 ENCOUNTER — Ambulatory Visit: Payer: Self-pay | Admitting: *Deleted

## 2021-08-02 ENCOUNTER — Ambulatory Visit
Admission: RE | Admit: 2021-08-02 | Discharge: 2021-08-02 | Disposition: A | Discharge status: Home / Self Care | Payer: No Typology Code available for payment source | Source: Ambulatory Visit | Attending: Obstetrics and Gynecology | Admitting: Obstetrics and Gynecology

## 2021-08-02 ENCOUNTER — Ambulatory Visit
Admission: RE | Admit: 2021-08-02 | Discharge: 2021-08-02 | Disposition: A | Discharge status: Home / Self Care | Payer: Self-pay | Source: Ambulatory Visit | Attending: Obstetrics and Gynecology | Admitting: Obstetrics and Gynecology

## 2021-08-02 ENCOUNTER — Other Ambulatory Visit: Payer: Self-pay

## 2021-08-02 VITALS — BP 122/80 | Wt 107.4 lb

## 2021-08-02 DIAGNOSIS — N644 Mastodynia: Secondary | ICD-10-CM

## 2021-08-02 DIAGNOSIS — N6452 Nipple discharge: Secondary | ICD-10-CM

## 2021-08-02 DIAGNOSIS — Z1211 Encounter for screening for malignant neoplasm of colon: Secondary | ICD-10-CM

## 2021-08-02 DIAGNOSIS — Z1239 Encounter for other screening for malignant neoplasm of breast: Secondary | ICD-10-CM

## 2021-08-02 NOTE — Progress Notes (Signed)
[name] is a 60 y.o. female who presents to [facility] clinic today with complaint of left nipple discharge and pain since August 2022. Patient states the left breast discharge is spontaneous. Patient stated the discharge started clear but is now bloody. Patient complained of left nipple area pain that comes and goes. Patient rates the pain at a 8 out of 10.    Pap Smear: Pap smear not completed today. Last Pap smear was 09/29/2019 at [facility] and was normal with positive HPV. Per patient has no history of an abnormal Pap smear. Last Pap smear result is available in Epic.   Physical exam: Breasts Left breast slightly larger than right breast that per patient is a change. No skin abnormalities right breast. Left nipple scabbed and swollen. No nipple retraction bilateral breasts. No nipple discharge right breast. Patient has complained of bloody left nipple discharge. Unable to express any nipple discharge on exam today. No lymphadenopathy. No lumps palpated bilateral breasts. Complaints of left nipple area pain on exam.   Pelvic/Bimanual Pap is due. Patient refused Pap smear today. Patient scheduled appointment af the free cervical cancer screening on Wednesday, September 28, 2021 at 0915.   Smoking History: Patient is a current smoker. Discussed smoking cessation with patient. Referred patient to the [facility] Quitline and gave resources to the free smoking cessation program at [facility].   Patient Navigation: Patient education provided. Access to services provided for patient through [facility] program. Transportation provided by uber/lyft to appointment at [facility] and home following appointment.  Colorectal Cancer Screening: Per patient had a colonoscopy completed 10-15 years ago. FIT Test given to patient to complete. No complaints today.   Breast and Cervical Cancer Risk Assessment: Patient has family history of her mother, two sisters and a maternal niece  having breast cancer. Patient has no known genetic mutations or history of radiation treatment to the chest before age 34. Patient does not have history of cervical dysplasia, immunocompromised, or DES exposure in-utero.  Risk Assessment     Risk Scores       08/02/2021   Last edited by: Narda Rutherford, LPN   5-year risk: 2.8 %   Lifetime risk: 13.5 %            A: [facility] exam without pap smear Complaint of left nipple discharge and pain.  P: Referred patient to the [facility] for a diagnostic mammogram. Appointment scheduled Tuesday, August 02, 2021 at 1400.  Priscille Heidelberg, RN 08/02/2021 12:17 PM

## 2021-08-02 NOTE — Patient Instructions (Addendum)
Explained breast self awareness with [name]. Patient refused Pap smear today. Patient scheduled appointment af the free cervical cancer screening on Wednesday, September 28, 2021 at 0915. Referred patient to the [facility] for a diagnostic mammogram. Appointment scheduled Tuesday, August 02, 2021 at 1400. Patient aware of appointment and will be there. Discussed smoking cessation with patient. Referred patient to the [facility] Quitline and gave resources to the free smoking cessation program at [facility]. [name] verbalized understanding.  Barrie Sigmund, Kathaleen Maser, RN 12:17 PM

## 2021-08-12 ENCOUNTER — Other Ambulatory Visit (HOSPITAL_COMMUNITY): Payer: Self-pay | Admitting: Surgery

## 2021-08-12 ENCOUNTER — Other Ambulatory Visit: Payer: Self-pay | Admitting: Surgery

## 2021-08-12 DIAGNOSIS — N6452 Nipple discharge: Secondary | ICD-10-CM

## 2021-08-25 ENCOUNTER — Ambulatory Visit (HOSPITAL_COMMUNITY)
Admission: RE | Admit: 2021-08-25 | Discharge: 2021-08-25 | Disposition: A | Discharge status: Home / Self Care | Payer: Self-pay | Source: Ambulatory Visit | Attending: Surgery | Admitting: Surgery

## 2021-08-25 ENCOUNTER — Other Ambulatory Visit: Payer: Self-pay

## 2021-08-25 DIAGNOSIS — N6452 Nipple discharge: Secondary | ICD-10-CM | POA: Insufficient documentation

## 2021-08-25 MED ORDER — GADOBUTROL 1 MMOL/ML IV SOLN
5.0000 mL | Freq: Once | INTRAVENOUS | Status: AC | PRN
  Administered 2021-08-25: 5 mL via INTRAVENOUS

## 2021-09-28 ENCOUNTER — Ambulatory Visit: Payer: Self-pay

## 2022-02-28 ENCOUNTER — Encounter: Payer: No Typology Code available for payment source | Admitting: Internal Medicine

## 2022-03-01 ENCOUNTER — Encounter: Payer: Self-pay | Admitting: Internal Medicine

## 2022-03-20 ENCOUNTER — Ambulatory Visit (INDEPENDENT_AMBULATORY_CARE_PROVIDER_SITE_OTHER): Payer: Self-pay | Admitting: Internal Medicine

## 2022-03-20 ENCOUNTER — Other Ambulatory Visit (HOSPITAL_COMMUNITY): Payer: Self-pay

## 2022-03-20 ENCOUNTER — Encounter: Payer: Self-pay | Admitting: Internal Medicine

## 2022-03-20 ENCOUNTER — Other Ambulatory Visit: Payer: Self-pay

## 2022-03-20 DIAGNOSIS — I10 Essential (primary) hypertension: Secondary | ICD-10-CM

## 2022-03-20 DIAGNOSIS — Z0000 Encounter for general adult medical examination without abnormal findings: Secondary | ICD-10-CM

## 2022-03-20 DIAGNOSIS — B351 Tinea unguium: Secondary | ICD-10-CM | POA: Insufficient documentation

## 2022-03-20 DIAGNOSIS — I5032 Chronic diastolic (congestive) heart failure: Secondary | ICD-10-CM | POA: Insufficient documentation

## 2022-03-20 DIAGNOSIS — F1011 Alcohol abuse, in remission: Secondary | ICD-10-CM

## 2022-03-20 DIAGNOSIS — I110 Hypertensive heart disease with heart failure: Secondary | ICD-10-CM

## 2022-03-20 DIAGNOSIS — Z5989 Other problems related to housing and economic circumstances: Secondary | ICD-10-CM

## 2022-03-20 DIAGNOSIS — F1910 Other psychoactive substance abuse, uncomplicated: Secondary | ICD-10-CM

## 2022-03-20 DIAGNOSIS — R634 Abnormal weight loss: Secondary | ICD-10-CM

## 2022-03-20 DIAGNOSIS — F17210 Nicotine dependence, cigarettes, uncomplicated: Secondary | ICD-10-CM

## 2022-03-20 DIAGNOSIS — I428 Other cardiomyopathies: Secondary | ICD-10-CM

## 2022-03-20 DIAGNOSIS — I6930 Unspecified sequelae of cerebral infarction: Secondary | ICD-10-CM

## 2022-03-20 DIAGNOSIS — K219 Gastro-esophageal reflux disease without esophagitis: Secondary | ICD-10-CM

## 2022-03-20 DIAGNOSIS — M5412 Radiculopathy, cervical region: Secondary | ICD-10-CM

## 2022-03-20 DIAGNOSIS — I509 Heart failure, unspecified: Secondary | ICD-10-CM | POA: Insufficient documentation

## 2022-03-20 DIAGNOSIS — I5020 Unspecified systolic (congestive) heart failure: Secondary | ICD-10-CM

## 2022-03-20 DIAGNOSIS — F32A Depression, unspecified: Secondary | ICD-10-CM

## 2022-03-20 DIAGNOSIS — E559 Vitamin D deficiency, unspecified: Secondary | ICD-10-CM

## 2022-03-20 DIAGNOSIS — Z720 Tobacco use: Secondary | ICD-10-CM

## 2022-03-20 DIAGNOSIS — R059 Cough, unspecified: Secondary | ICD-10-CM

## 2022-03-20 DIAGNOSIS — H538 Other visual disturbances: Secondary | ICD-10-CM

## 2022-03-20 MED ORDER — FUROSEMIDE 20 MG PO TABS
20.0000 mg | ORAL_TABLET | Freq: Every day | ORAL | 3 refills | Status: DC
  Filled 2022-03-20: qty 30, 30d supply, fill #0

## 2022-03-20 MED ORDER — ATORVASTATIN CALCIUM 40 MG PO TABS
40.0000 mg | ORAL_TABLET | Freq: Every day | ORAL | 3 refills | Status: DC
Start: 2022-03-20 — End: 2023-06-07
  Filled 2022-03-20: qty 30, 30d supply, fill #0

## 2022-03-20 MED ORDER — GABAPENTIN 300 MG PO CAPS
300.0000 mg | ORAL_CAPSULE | Freq: Three times a day (TID) | ORAL | 3 refills | Status: DC
  Filled 2022-03-20: qty 90, 30d supply, fill #0

## 2022-03-20 MED ORDER — NITROGLYCERIN 0.4 MG SL SUBL
0.4000 mg | SUBLINGUAL_TABLET | SUBLINGUAL | 1 refills | Status: DC | PRN
  Filled 2022-03-20: qty 25, 5d supply, fill #0

## 2022-03-20 MED ORDER — OMEPRAZOLE 20 MG PO CPDR
20.0000 mg | DELAYED_RELEASE_CAPSULE | Freq: Every day | ORAL | 3 refills | Status: DC
  Filled 2022-03-20: qty 30, 30d supply, fill #0

## 2022-03-20 MED ORDER — CARVEDILOL 6.25 MG PO TABS
6.2500 mg | ORAL_TABLET | Freq: Two times a day (BID) | ORAL | 3 refills | Status: DC
  Filled 2022-03-20: qty 60, 30d supply, fill #0

## 2022-03-20 NOTE — Assessment & Plan Note (Signed)
Patient used to take omeprazole for GERD. Has not taken this medicine in several weeks and requests refill for burning reflux symptoms. Plan: -Refilled omeprazole (IM Program)

## 2022-03-20 NOTE — Assessment & Plan Note (Signed)
Patient has history of Myopia and now requiring reading glasses as well. She needs new glasses prescription. She would also like to be screened for glaucoma since this runs in the family.  Patient currently uninsured. She will need to self pay for optometry evaluation for new glasses and glaucoma screening. Plan: -Find optometrist for glasses and glaucoma screening, self pay, [facility] probably cheapest. -Obtain insurance coverage for referral to opthalmology

## 2022-03-20 NOTE — Progress Notes (Signed)
CC: routine follow up  HPI:  [name] is a 61 y.o. female with a past medical history stated below.   GERD (gastroesophageal reflux disease) Patient used to take omeprazole for GERD. Has not taken this medicine in several weeks and requests refill for burning reflux symptoms. Plan: -Refilled omeprazole (IM Program)  Depression Patient presents for routine health appointment. She reports in the last year she has had more distress related to death of family members and friends. Her long time partner passed last year as did many of her friends. She was also homeless last year, has now moved in with her daughter and grand-daughter. She endorses them as good sources of support though wishes to live alone and does not like living with them. Recently she has cut down on her substance use, no cocaine use in 3 weeks, only 3 beers a week, only 3-4 cigarettes a day, smokes marijuana. She no longer hangs out with the crowd she would do drugs with. She has reconnected with her church and this has been helpful for her.   PHQ-9 today 19. She endorses passive thoughts of suicide in the last 2 weeks without a plan or intention to harm herself. She endorses her family as a reason to continue living.   Patient presenting with hx of MDD with recent worsening of symptoms due to grieving for death of friends and family and dissatisfaction with her housing situation. No SI though passive thoughts without intent or plan. She has history of bipolar disorder with mania though with concurrent substance use this is difficult to verify primary bipolar disoder vs mood disorder secondary to substance use. Has been referred to psychiatry in the past. Has not seen psychiatry recently. Unfortunately she is uninsured at this time. Would be concerned about starting antidepressant and precipitating mania.  Plan: -Gave patient printout information for [facility] services -Patient provided with orange card and CAFA  packets, once patient has insurance we can refer to psychiatry and Dr. Monna Fam if she prefers. -Follow up in 2 months to see if patient was able to get insurance and see [facility]  Unintentional weight loss Patient had lost weight for several years in the setting of polysubstance abuse. Recently she has abstained from cocaine and reduced her alcohol use significantly. She reports her appetite is slowly improving. She has gained 10lbs since October.   Health care maintenance Once patient obtains insurance please discuss: colon cancer screening, shingles vaccine #2 and tetanus booster.  Tobacco use Patient currently smokes 3-4 cigarettes daily. She would like to reduce her cigarettes use but is currently feeling overwhelmed. Continue motivational interviewing for smoking cessation.  Polysubstance abuse (with elevated transaminases)  Recently she has cut down on her substance use, no cocaine use in 3 weeks, only 3 beers a week, only 3-4 cigarettes a day, still smokes marijuana fairly regularly. Patient has been living with her daughter recently though would prefer to find a place of her own. She no longer spends time with the crowd that misuses substances, states her daughter will not allow them near her family. She has reconnected with her church and this has been helpful for her.   I congratulated patient on her hard work to cut down on her substance use. We dicussed the dangers of cocaine use in a patient with significant heart history including death from cardiac arrest. Patient feeling overwhelmed due to symptoms of depression recently. She will reach out to [facility] about housing, this is a resource she has used  before.  Plan: -Continue motivational interviewing for substance use cessation    Onychomycosis Patient with onychomycosis of most toe nails on bilateral feet with toe nail overgrowth. She has tried OTC products in the past without success. Requests referral to podiatry.   Patient  currently uninsured, patient will need to complete paperwork for orange card/CAFA to refer to podiatry.   Uninsured Patient has no health insurance. Was provided with CAFA and orange card packet and instructed to fill this out and follow directions on cover of packet. Patient has had difficulty filling this out in the past.  Plan: -Referral to care coordination to help fill out paperwork to obtain health insurance coverage through CAFA/orange card  Blurry vision Patient has history of Myopia and now requiring reading glasses as well. She needs new glasses prescription. She would also like to be screened for glaucoma since this runs in the family.  Patient currently uninsured. She will need to self pay for optometry evaluation for new glasses and glaucoma screening. Plan: -Find optometrist for glasses and glaucoma screening, self pay, [facility] probably cheapest. -Obtain insurance coverage for referral to opthalmology  HFrEF (heart failure with reduced ejection fraction) (HCC) Patient has hx of HFrEF with NICM likely secondary to alcohol use. Last ECHO 2016 showed mildly reduced systolic function, EF 45% to 50%, diffuse hypokinesis, grade 1 diastolic dysfunction, septal motion paradox consistent with intraventricular conduction delay, calcified mitral valve, moderate mitral valve regurgitation.  On exam today, no crackles, trace lower extremity edema bilaterally.  Nodule regurgitation murmur auscultated. Patient has not taken Lasix in some time. Patient not currently in heart failure exacerbation, fairly euvolemic on exam, has not had an echo in some time. Plan: -Lasix refilled (IM program) -We will want to repeat echo however patient is currently uninsured, will need to obtain insurance prior to echo order   Past Medical History:  Diagnosis Date   Alcoholism (HCC)    ANEMIA, VITAMIN B12 DEFICIENCY 11/11/2007   Documented low 175 on 09/2007; received 6 yrs of IM therapy; switched to po 05/2013     Anxiety    Breast pain, left 06/02/2021   Cardiac arrest (HCC) 01/16/2013   witnessed, shockable rhythm, initally low EF, no CAD at cath. Drug screen negative.   Chest pain    a. 04/2009 Echo: EF 55-60%, Gr1 DD, Mild MR.   Depression    Duodenitis    secondary to H pylori(treatment completed 05/2006), +/- NSAIDS   Duodenitis    a. 05/2006 2/2 H pylori +/- NSAIDS   DVT (deep venous thrombosis) (HCC)    a. 04/2009 - treated with coumadin x 8 mos.   ETOH abuse    Heart murmur    History of cocaine abuse (HCC)    History of CVA (cerebrovascular accident)    Hypertension    Left arm pain 06/11/2017   PE 05/16/2009   CT angio positive for PE in 07/10, treated with coumadin for 8 months.    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 04/2009 - treated with coumadin x 8 mos.   Rectal bleeding 2007   internal hemmorhoids by colonoscopy in 04/2006, Bx neg for IBD   Rectal bleeding    a. int hemorrhoids by colonoscopy in 04/2006, Bx neg for IBD   Right foot pain 05/05/2020   Seizures (HCC)    Shortness of breath 05/28/2020   Stroke (HCC)    Tobacco abuse    Trochanteric bursitis of left hip 05/22/2016  Current Outpatient Medications on File Prior to Visit  Medication Sig Dispense Refill   aspirin EC 81 MG tablet Take 81 mg by mouth daily. Swallow whole.     Cholecalciferol 1000 units capsule Take 1 capsule (1,000 Units total) by mouth daily. 90 capsule 3   thiamine 100 MG tablet Take 1 tablet (100 mg total) by mouth daily. 30 tablet 6   vitamin B-12 (CYANOCOBALAMIN) 1000 MCG tablet Take 1 tablet (1,000 mcg total) by mouth daily. 30 tablet 2   No current facility-administered medications on file prior to visit.    Family History  Problem Relation Age of Onset   Breast cancer Mother 72   Heart disease Mother    Diabetes Mother    CAD Mother    Heart attack Mother    Hypertension Mother    Heart disease Father    Colon cancer Father    CAD Father    Breast cancer Sister 57    Diabetes Sister    Hypertension Sister    Heart disease Brother    Diabetes Brother    CAD Brother    Coronary artery disease Other        1 st degree female relative <60 and female <50   Diabetes type II Other        1 st degree relative   Breast cancer Niece 96    Review of Systems: ROS negative except for what is noted on the assessment and plan.  Vitals:   03/20/22 0855  BP: 129/72  Pulse: 71  Temp: 97.7 F (36.5 C)  TempSrc: Oral  SpO2: 100%  Weight: 117 lb 14.4 oz (53.5 kg)  Height: 5\' 4"  (1.626 m)     Physical Exam: General: Well appearing normal weight african female, NAD HENT: normocephalic, atraumatic, external ears and nares appear unremarkable EYES: conjunctiva non-erythematous, no scleral icterus CV: regular rate, normal rhythm, no murmurs, rubs, gallops.  Systolic murmur best auscultated at the apex. Trace LEE bilaterally. Pulmonary: normal work of breathing on RA, lungs clear to auscultation, no rales, wheezes, rhonchi Abdominal: non-distended, soft, non-tender to palpation, normal BS Skin: Warm and dry, no rashes or lesions on exposed surfaces Neurological: MS: awake, alert and oriented x3, normal speech and fund of knowledge Motor: moves all extremities antigravity Psych: normal affect, restless     See Encounters Tab for problem based charting.  Patient discussed with Dr. [place], M.D. [facility] Internal Medicine, PGY-1 Pager: [phone] Date 03/20/2022 Time 12:07 PM

## 2022-03-20 NOTE — Assessment & Plan Note (Signed)
Patient currently smokes 3-4 cigarettes daily. She would like to reduce her cigarettes use but is currently feeling overwhelmed. Continue motivational interviewing for smoking cessation.

## 2022-03-20 NOTE — Assessment & Plan Note (Signed)
Patient presents for routine health appointment. She reports in the last year she has had more distress related to death of family members and friends. Her long time partner passed last year as did many of her friends. She was also homeless last year, has now moved in with her daughter and grand-daughter. She endorses them as good sources of support though wishes to live alone and does not like living with them. Recently she has cut down on her substance use, no cocaine use in 3 weeks, only 3 beers a week, only 3-4 cigarettes a day, smokes marijuana. She no longer hangs out with the crowd she would do drugs with. She has reconnected with her church and this has been helpful for her.   PHQ-9 today 19. She endorses passive thoughts of suicide in the last 2 weeks without a plan or intention to harm herself. She endorses her family as a reason to continue living.   Patient presenting with hx of MDD with recent worsening of symptoms due to grieving for death of friends and family and dissatisfaction with her housing situation. No SI though passive thoughts without intent or plan. She has history of bipolar disorder with mania though with concurrent substance use this is difficult to verify primary bipolar disoder vs mood disorder secondary to substance use. Has been referred to psychiatry in the past. Has not seen psychiatry recently. Unfortunately she is uninsured at this time. Would be concerned about starting antidepressant and precipitating mania.  Plan: -Gave patient printout information for [facility] services -Patient provided with orange card and CAFA packets, once patient has insurance we can refer to psychiatry and Dr. Monna Fam if she prefers. -Follow up in 2 months to see if patient was able to get insurance and see V[facility] Mixer

## 2022-03-20 NOTE — Assessment & Plan Note (Addendum)
Refilled Liptor (IM Program). Patient gets her aspirin 81mg  over the counter.

## 2022-03-20 NOTE — Assessment & Plan Note (Signed)
Patient had lost weight for several years in the setting of polysubstance abuse. Recently she has abstained from cocaine and reduced her alcohol use significantly. She reports her appetite is slowly improving. She has gained 10lbs since October.

## 2022-03-20 NOTE — Assessment & Plan Note (Signed)
Patient reports long history of right sided cervical radiculopathy with pain in her neck and tingling and numbness down to her finger tips of her right arm, used to take gabapentin for this though has been out of this medication for several months.  Plan: -Refill gabapentin 300 mg 3 times daily (IM program)

## 2022-03-20 NOTE — Assessment & Plan Note (Signed)
Patient with onychomycosis of most toe nails on bilateral feet with toe nail overgrowth. She has tried OTC products in the past without success. Requests referral to podiatry.   Patient currently uninsured, patient will need to complete paperwork for orange card/CAFA to refer to podiatry.

## 2022-03-20 NOTE — Assessment & Plan Note (Signed)
Once patient obtains insurance please discuss: colon cancer screening, shingles vaccine #2 and tetanus booster.

## 2022-03-20 NOTE — Patient Instructions (Signed)
Thank you, Ms.Jodilyn D Smelcer for allowing [place] to provide your care today. Today we discussed:  Depressed mood: -Please stop by [facility] for behavioral health services -Let them know you have had a hx of bipolar with mania and need treatment for depression -Once you have insurance we can refer you to a psychiatrist or have you see our psychologist her in clinic if you prefer, or you can continue to work with [facility] Mixer  Toe nails: -Once you have health insurance we can refer you to podiatry (foot doctor)  Vision: -You can see an optometrist for glasses and glaucoma screening, this would be cheaper than ophthalmologist. You would need to pay out of pocket for this.  Nerve Pain: -I refilled your gabapentin   I refilled your medicines and sent them to [place] outpatient pharmacy Continue to work hard on reducing substance use, you are doing a great job so far!   My Chart Access: [email or web address]  Please follow-up in 2 months.  Please make sure to arrive 15 minutes prior to your next appointment. If you arrive late, you may be asked to reschedule.    We look forward to seeing you next time. Please call our clinic at [phone] if you have any questions or concerns. The best time to call is Monday-Friday from 9am-4pm, but there is someone available 24/7. If after hours or the weekend, call the main hospital number and ask for the Internal Medicine Resident On-Call. If you need medication refills, please notify your pharmacy one week in advance and they will send [place] a request.   Thank you for letting us take part in your care. Wishing you the best!  Ellison Carwin, MD 03/20/2022, 9:55 AM IM Resident, PGY-1

## 2022-03-20 NOTE — Assessment & Plan Note (Addendum)
Recently she has cut down on her substance use, no cocaine use in 3 weeks, only 3 beers a week, only 3-4 cigarettes a day, still smokes marijuana fairly regularly. Patient has been living with her daughter recently though would prefer to find a place of her own. She no longer spends time with the crowd that misuses substances, states her daughter will not allow them near her family. She has reconnected with her church and this has been helpful for her.   I congratulated patient on her hard work to cut down on her substance use. We dicussed the dangers of cocaine use in a patient with significant heart history including death from cardiac arrest. Patient feeling overwhelmed due to symptoms of depression recently. She will reach out to [facility] about housing, this is a resource she has used before.  Plan: -Continue motivational interviewing for substance use cessation

## 2022-03-20 NOTE — Assessment & Plan Note (Signed)
Patient has no health insurance. Was provided with CAFA and orange card packet and instructed to fill this out and follow directions on cover of packet. Patient has had difficulty filling this out in the past.  Plan: -Referral to care coordination to help fill out paperwork to obtain health insurance coverage through CAFA/orange card

## 2022-03-20 NOTE — Assessment & Plan Note (Addendum)
Patient has hx of HFrEF with NICM likely secondary to alcohol use. Last ECHO 2016 showed mildly reduced systolic function, EF 45% to 50%, diffuse hypokinesis, grade 1 diastolic dysfunction, septal motion paradox consistent with intraventricular conduction delay, calcified mitral valve, moderate mitral valve regurgitation.  On exam today, no crackles, trace lower extremity edema bilaterally.  Nodule regurgitation murmur auscultated. Patient has not taken Lasix in some time. Patient not currently in heart failure exacerbation, fairly euvolemic on exam, has not had an echo in some time. Plan: -Lasix refilled (IM program) -Coreg refilled (IM program) -We will want to repeat echo however patient is currently uninsured, will need to obtain insurance prior to echo order

## 2022-03-22 NOTE — Progress Notes (Signed)
Internal Medicine Clinic Attending ? ?Case discussed with Dr. Zinoviev  At the time of the visit.  We reviewed the resident?s history and exam and pertinent patient test results.  I agree with the assessment, diagnosis, and plan of care documented in the resident?s note.  ?

## 2022-03-22 NOTE — Congregational Nurse Program (Signed)
  Dept: [phone]   Congregational Nurse Program Note  Date of Encounter: 03/22/2022  Past Medical History: Past Medical History:  Diagnosis Date   Alcoholism ([place])    ANEMIA, VITAMIN B12 DEFICIENCY 11/11/2007   Documented low 175 on 09/2007; received 6 yrs of IM therapy; switched to po 05/2013    Anxiety    Breast pain, left 06/02/2021   Cardiac arrest ([place]) 01/16/2013   witnessed, shockable rhythm, initally low EF, no CAD at cath. Drug screen negative.   Chest pain    a. 04/2009 Echo: EF 55-60%, Gr1 DD, Mild MR.   Depression    Duodenitis    secondary to H pylori(treatment completed 05/2006), +/- NSAIDS   Duodenitis    a. 05/2006 2/2 H pylori +/- NSAIDS   DVT (deep venous thrombosis) ([place])    a. 04/2009 - treated with coumadin x 8 mos.   ETOH abuse    Heart murmur    History of cocaine abuse ([place])    History of CVA (cerebrovascular accident)    Hypertension    Left arm pain 06/11/2017   PE 05/16/2009   CT angio positive for PE in 07/10, treated with coumadin for 8 months.    Podagra 05/28/2020   Pulmonary embolism ([place])    a. 04/2009 - treated with coumadin x 8 mos.   Rectal bleeding 2007   internal hemmorhoids by colonoscopy in 04/2006, Bx neg for IBD   Rectal bleeding    a. int hemorrhoids by colonoscopy in 04/2006, Bx neg for IBD   Right foot pain 05/05/2020   Seizures ([place])    Shortness of breath 05/28/2020   Stroke ([place])    Tobacco abuse    Trochanteric bursitis of left hip 05/22/2016    Encounter Details:  CNP Questionnaire - 03/22/22 1211       Questionnaire   Do you give verbal consent to treat you today? Yes    Location Patient Served  [facility]    Visit Setting Church or Organization    Insurance Medicaid   Family plaining   Insurance Referral N/A    Medication Have Medication Insecurities    Medical Provider Yes    Screening Referrals N/A    Medical Referral N/A    Medical Appointment [place]    Housing/Utilities No permanent housing     Interpersonal Safety N/A    Intervention Blood pressure    ED Visit Averted N/A    Life-Saving Intervention Made N/A             Patient states she has experienced 3 death over the past year and feels she needs some help with her depression.  Patient state she feels like losing her significant other then losing their apartment just has put her over.  Referral mad to [facility] after speaking with [place], she stated best for her to come in a s a walk in btw 8-11 am Mon and Weds and doors open at 7:30 am or else would be the end of June to actual schedule an appointment.  "Patient given a card with Center's name, days, and time for walk in appointments.  Patient also given 2 bus passes for transportation.  Morrie Sheldon, RN BSN [place] Cell [phone]

## 2022-03-23 ENCOUNTER — Telehealth: Payer: Self-pay | Admitting: *Deleted

## 2022-03-23 NOTE — Chronic Care Management (AMB) (Signed)
  Care Management   Outreach Note  03/23/2022 Name: [name] MRN: [id number] DOB: 05/27/1961  Referred by: Masters, Florentina Addison, DO Reason for referral : Care Coordination (Initial outreach to schedule referral with BSW)   An unsuccessful telephone outreach was attempted today. The patient was referred to the case management team for assistance with care management and care coordination.   Follow Up Plan:  The care management team will reach out to the patient again over the next 7 days.  If patient returns call to provider office, please advise to call Embedded Care Management Care Guide Misty Stanley * at [phone]*.  Gwenevere Ghazi  Care Guide, Embedded Care Coordination [facility] Management  Direct Dial: [phone]

## 2022-03-29 ENCOUNTER — Encounter (HOSPITAL_COMMUNITY): Payer: Self-pay | Admitting: Emergency Medicine

## 2022-03-29 ENCOUNTER — Ambulatory Visit (HOSPITAL_COMMUNITY)
Admission: EM | Admit: 2022-03-29 | Discharge: 2022-03-30 | Disposition: A | Discharge status: Home / Self Care | Payer: No Payment, Other | Attending: Family | Admitting: Family

## 2022-03-29 DIAGNOSIS — F1910 Other psychoactive substance abuse, uncomplicated: Secondary | ICD-10-CM | POA: Insufficient documentation

## 2022-03-29 DIAGNOSIS — Z79899 Other long term (current) drug therapy: Secondary | ICD-10-CM | POA: Insufficient documentation

## 2022-03-29 DIAGNOSIS — Z20822 Contact with and (suspected) exposure to covid-19: Secondary | ICD-10-CM | POA: Insufficient documentation

## 2022-03-29 DIAGNOSIS — R7401 Elevation of levels of liver transaminase levels: Secondary | ICD-10-CM | POA: Diagnosis not present

## 2022-03-29 DIAGNOSIS — F4321 Adjustment disorder with depressed mood: Secondary | ICD-10-CM | POA: Insufficient documentation

## 2022-03-29 DIAGNOSIS — Z9151 Personal history of suicidal behavior: Secondary | ICD-10-CM | POA: Insufficient documentation

## 2022-03-29 LAB — CBC WITH DIFFERENTIAL/PLATELET
Abs Immature Granulocytes: 0.04 10*3/uL (ref 0.00–0.07)
Basophils Absolute: 0 10*3/uL (ref 0.0–0.1)
Basophils Relative: 0 %
Eosinophils Absolute: 0.2 10*3/uL (ref 0.0–0.5)
Eosinophils Relative: 2 %
HCT: 33.7 % — ABNORMAL LOW (ref 36.0–46.0)
Hemoglobin: 10.5 g/dL — ABNORMAL LOW (ref 12.0–15.0)
Immature Granulocytes: 1 %
Lymphocytes Relative: 28 %
Lymphs Abs: 2.5 10*3/uL (ref 0.7–4.0)
MCH: 29.7 pg (ref 26.0–34.0)
MCHC: 31.2 g/dL (ref 30.0–36.0)
MCV: 95.2 fL (ref 80.0–100.0)
Monocytes Absolute: 0.7 10*3/uL (ref 0.1–1.0)
Monocytes Relative: 9 %
Neutro Abs: 5.3 10*3/uL (ref 1.7–7.7)
Neutrophils Relative %: 60 %
Platelets: 335 10*3/uL (ref 150–400)
RBC: 3.54 MIL/uL — ABNORMAL LOW (ref 3.87–5.11)
RDW: 13.2 % (ref 11.5–15.5)
WBC: 8.7 10*3/uL (ref 4.0–10.5)
nRBC: 0 % (ref 0.0–0.2)

## 2022-03-29 LAB — COMPREHENSIVE METABOLIC PANEL
ALT: 17 U/L (ref 0–44)
AST: 20 U/L (ref 15–41)
Albumin: 3.5 g/dL (ref 3.5–5.0)
Alkaline Phosphatase: 65 U/L (ref 38–126)
Anion gap: 6 (ref 5–15)
BUN: 9 mg/dL (ref 6–20)
CO2: 26 mmol/L (ref 22–32)
Calcium: 9.1 mg/dL (ref 8.9–10.3)
Chloride: 109 mmol/L (ref 98–111)
Creatinine, Ser: 0.64 mg/dL (ref 0.44–1.00)
GFR, Estimated: >60 mL/min (ref >60)
Glucose, Bld: 101 mg/dL — ABNORMAL HIGH (ref 70–99)
Potassium: 3.9 mmol/L (ref 3.5–5.1)
Sodium: 141 mmol/L (ref 135–145)
Total Bilirubin: 0.3 mg/dL (ref 0.3–1.2)
Total Protein: 6.3 g/dL — ABNORMAL LOW (ref 6.5–8.1)

## 2022-03-29 LAB — POC SARS CORONAVIRUS 2 AG: SARSCOV2ONAVIRUS 2 AG: NEGATIVE

## 2022-03-29 LAB — ETHANOL: Alcohol, Ethyl (B): <10 mg/dL (ref <10)

## 2022-03-29 LAB — LIPID PANEL
Cholesterol: 150 mg/dL (ref 0–200)
HDL: 59 mg/dL (ref >40)
LDL Cholesterol: 57 mg/dL (ref 0–99)
Total CHOL/HDL Ratio: 2.5 RATIO
Triglycerides: 169 mg/dL — ABNORMAL HIGH (ref <150)
VLDL: 34 mg/dL (ref 0–40)

## 2022-03-29 LAB — POCT URINE DRUG SCREEN - MANUAL ENTRY (I-SCREEN)
POC Amphetamine UR: NOT DETECTED
POC Buprenorphine (BUP): NOT DETECTED
POC Cocaine UR: NOT DETECTED
POC Marijuana UR: POSITIVE — AB
POC Methadone UR: NOT DETECTED
POC Methamphetamine UR: NOT DETECTED
POC Morphine: NOT DETECTED
POC Oxazepam (BZO): NOT DETECTED
POC Oxycodone UR: NOT DETECTED
POC Secobarbital (BAR): NOT DETECTED

## 2022-03-29 LAB — TSH: TSH: 0.988 u[IU]/mL (ref 0.350–4.500)

## 2022-03-29 LAB — RESP PANEL BY RT-PCR (FLU A&B, COVID) ARPGX2
Influenza A by PCR: NEGATIVE
Influenza B by PCR: NEGATIVE
SARS Coronavirus 2 by RT PCR: NEGATIVE

## 2022-03-29 LAB — HEMOGLOBIN A1C
Hgb A1c MFr Bld: 5.2 % (ref 4.8–5.6)
Mean Plasma Glucose: 102.54 mg/dL

## 2022-03-29 LAB — MAGNESIUM: Magnesium: 2 mg/dL (ref 1.7–2.4)

## 2022-03-29 MED ORDER — THIAMINE HCL 100 MG/ML IJ SOLN
100.0000 mg | Freq: Once | INTRAMUSCULAR | Status: AC
  Administered 2022-03-29: 100 mg via INTRAMUSCULAR
  Filled 2022-03-29: qty 2

## 2022-03-29 MED ORDER — ALUM & MAG HYDROXIDE-SIMETH 200-200-20 MG/5ML PO SUSP: 30.0000 mL | ORAL | Status: DC | PRN

## 2022-03-29 MED ORDER — LORAZEPAM 1 MG PO TABS: 1.0000 mg | ORAL_TABLET | Freq: Four times a day (QID) | ORAL | Status: DC | PRN

## 2022-03-29 MED ORDER — THIAMINE HCL 100 MG PO TABS
100.0000 mg | ORAL_TABLET | Freq: Every day | ORAL | Status: DC
Start: 2022-03-30 — End: 2022-03-30
  Administered 2022-03-30: 100 mg via ORAL
  Filled 2022-03-29: qty 1

## 2022-03-29 MED ORDER — PANTOPRAZOLE SODIUM 40 MG PO TBEC
40.0000 mg | DELAYED_RELEASE_TABLET | Freq: Every day | ORAL | Status: DC
  Administered 2022-03-29 – 2022-03-30 (×2): 40 mg via ORAL
  Filled 2022-03-29 (×2): qty 1

## 2022-03-29 MED ORDER — TRAZODONE HCL 50 MG PO TABS
50.0000 mg | ORAL_TABLET | Freq: Every evening | ORAL | Status: DC | PRN
  Administered 2022-03-29: 50 mg via ORAL
  Filled 2022-03-29: qty 1

## 2022-03-29 MED ORDER — LOPERAMIDE HCL 2 MG PO CAPS: 2.0000 mg | ORAL_CAPSULE | ORAL | Status: DC | PRN

## 2022-03-29 MED ORDER — ATORVASTATIN CALCIUM 40 MG PO TABS
40.0000 mg | ORAL_TABLET | Freq: Every day | ORAL | Status: DC
  Administered 2022-03-30: 40 mg via ORAL
  Filled 2022-03-29: qty 1

## 2022-03-29 MED ORDER — GABAPENTIN 300 MG PO CAPS
300.0000 mg | ORAL_CAPSULE | Freq: Three times a day (TID) | ORAL | Status: DC
  Administered 2022-03-29 – 2022-03-30 (×4): 300 mg via ORAL
  Filled 2022-03-29 (×4): qty 1

## 2022-03-29 MED ORDER — HYDROXYZINE HCL 25 MG PO TABS: 25.0000 mg | ORAL_TABLET | Freq: Three times a day (TID) | ORAL | Status: DC | PRN

## 2022-03-29 MED ORDER — ACETAMINOPHEN 325 MG PO TABS: 650.0000 mg | ORAL_TABLET | Freq: Four times a day (QID) | ORAL | Status: DC | PRN

## 2022-03-29 MED ORDER — MAGNESIUM HYDROXIDE 400 MG/5ML PO SUSP: 30.0000 mL | Freq: Every day | ORAL | Status: DC | PRN

## 2022-03-29 MED ORDER — ONDANSETRON 4 MG PO TBDP: 4.0000 mg | ORAL_TABLET | Freq: Four times a day (QID) | ORAL | Status: DC | PRN

## 2022-03-29 MED ORDER — CARVEDILOL 3.125 MG PO TABS
6.2500 mg | ORAL_TABLET | Freq: Two times a day (BID) | ORAL | Status: DC
  Administered 2022-03-29 – 2022-03-30 (×3): 6.25 mg via ORAL
  Filled 2022-03-29 (×3): qty 2

## 2022-03-29 MED ORDER — ADULT MULTIVITAMIN W/MINERALS CH
1.0000 | ORAL_TABLET | Freq: Every day | ORAL | Status: DC
Start: 2022-03-29 — End: 2022-03-30
  Administered 2022-03-29 – 2022-03-30 (×2): 1 via ORAL
  Filled 2022-03-29 (×3): qty 1

## 2022-03-29 NOTE — ED Notes (Signed)
Patient arrived on unit.Patient calm and cooperative. Patient given meal. Patient comfortable and safe on unit with continued monitoring.

## 2022-03-29 NOTE — ED Notes (Signed)
Pt resting quietly.  No distress noted.  

## 2022-03-29 NOTE — ED Notes (Signed)
Pt A&O x 4, ambulatory to bathroom, gait steady, no distress noted.  Monitoring fo r safety.  Denies SI.
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
## 2022-03-29 NOTE — ED Notes (Signed)
Two attempts at lab draws were performed without success. Pt was searched and skin assessment performed. She was escorted onto the unit and oriented. Pt was offered juice but declined and asked for water. Pt also given breakfast muffin. Will monitor for safety.

## 2022-03-29 NOTE — Progress Notes (Signed)
Patient in ED 5/31  [facility]  Direct Dial: [phone]

## 2022-03-29 NOTE — ED Notes (Signed)
Pt ate lunch and is currently resting quietly.
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
## 2022-03-30 ENCOUNTER — Other Ambulatory Visit: Payer: Self-pay

## 2022-03-30 LAB — PROLACTIN: Prolactin: 15.5 ng/mL (ref 4.8–23.3)

## 2022-03-30 MED ORDER — TRAZODONE HCL 50 MG PO TABS
50.0000 mg | ORAL_TABLET | Freq: Every evening | ORAL | 0 refills | Status: DC | PRN
  Filled 2022-03-30: qty 30, 30d supply, fill #0
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
## 2022-03-30 NOTE — ED Notes (Signed)
Pt sleeping at present, no distress noted.  Monitoring for safety. 

## 2022-03-30 NOTE — ED Notes (Signed)
Dorann has been giving breakfast. Banana Nut muffin & water.

## 2022-03-30 NOTE — ED Notes (Signed)
Patient A&O x 4, ambulatory. Patient discharged in no acute distress. Patient denied SI/HI, A/VH upon discharge. Patient verbalized understanding of all discharge instructions explained by staff, to include follow up appointments, RX's and safety plan. Patient reported mood 10/10.  Pt belongings returned to patient from locker # 31 intact. Patient escorted to lobby via staff for transport to destination. Safety maintained.

## 2022-03-30 NOTE — ED Notes (Signed)
Patient resting quietly in bed with eyes closed, Respirations equal and unlabored, skin warm and dry, NAD. No change in assessment or acuity. Continuous monitoring remain in place.    

## 2022-03-30 NOTE — Discharge Instructions (Addendum)
Patient is instructed prior to discharge to:  Take all medications as prescribed by his/her mental healthcare provider. Report any adverse effects and or reactions from the medicines to his/her outpatient provider promptly. Keep all scheduled appointments, to ensure that you are getting refills on time and to avoid any interruption in your medication.  If you are unable to keep an appointment call to reschedule.  Be sure to follow-up with resources and follow-up appointments provided.  Patient has been instructed & cautioned: To not engage in alcohol and or illegal drug use while on prescription medicines. In the event of worsening symptoms, patient is instructed to call the crisis hotline, 911 and or go to the nearest ED for appropriate evaluation and treatment of symptoms. To follow-up with his/her primary care provider for your other medical issues, concerns and or health care needs.    Substance Abuse Treatment Resources listed Below:  [facility] - Admissions are currently completed Monday through Friday at 8am; both appointments and walk-ins are accepted.  Any individual that is a [place] resident may present for a substance abuse screening and assessment for admission.  A person may be referred by numerous sources or self-refer.   Potential clients will be screened for medical necessity and appropriateness for the program.  Clients must meet criteria for high-intensity residential treatment services.  If clinically appropriate, a client will continue with the comprehensive clinical assessment and intake process, as well as enrollment in the [facility].  Address: [address] [place], [place] [address] Admin Hours: Mon-Fri 8AM to 5PM Center Hours: 24/7 Phone: [phone] Fax: [phone]  [facility] Address: [address], [place], [place] [address] [facility] ([facility]) Hours: 24/7 Phone:  [phone] Fax: [phone]  [facility]): (offers outpatient therapy and intensive outpatient substance abuse therapy).  [address], [place], [place] [address] Phone: [phone]  [facility]: Offers FREE recovery skills classes, support groups, 1:1 Peer Support, and Compeer Classes. [address], , [place] [address] Phone: [phone] (Call to complete intake).   [facility] [address] [place], [place] [address] Phone: [phone] ext [phone] [facility] provides food, shelter and other programs and services to the homeless men of [place] through our men's program.  By offering safe shelter, three meals a day, clean clothing, Biblical counseling, financial planning, vocational training, GED/education and employment assistance, we've helped mend the shattered lives of many homeless men since opening in 1974.  We have approximately 267 beds available, with a max of 312 beds including mats for emergency situations and currently house an average of 270 men a night.  Prospective Client Check-In Information Photo ID Required (State/ Out of State/ DOC) - if photo ID is not available, clients are required to have a printout of a police/sheriff's criminal history report. Help out with chores around the Mission. No sex offender of any type (pending, charged, registered and/or any other sex related offenses) will be permitted to check in. Must be willing to abide by all rules, regulations, and policies established by the [facility]. The following will be provided - shelter, food, clothing, and biblical counseling. If you or someone you know is in need of assistance at our men's shelter in [place], [place], please call [phone] ext. [phone].  [facility]-will provide timely access to mental health services for children and adolescents (4-17) and adults    presenting in a mental health crisis. The program is designed for those who need urgent Behavioral Health or Substance Use treatment and are not experiencing a medical crisis that would typically require an emergency room visit.    [address] [place], [place] [address] Phone: [phone] [email or web address]  [facility]: Phone#: [phone]  The [facility] SA Intensive Outpatient Program (SAIOP) means structured individual and group addiction activities and services that are provided at an outpatient program designed to assist adult and adolescent consumers to begin recovery and learn skills for recovery maintenance. The [facility] SAIOP program is offered at least 3 hours a day, 3 days a week.SAIOP services shall include a structured program consisting of, but not limited to, the following services: Individual counseling and support; Group counseling and support; Family counseling, training or support; Biochemical assays to identify recent drug use (e.g., urine drug screens); Strategies for relapse prevention to include community and social support systems in treatment; Life skills; Crisis contingency planning; Disease Management; and Treatment support activities that have been adapted or specifically designed for persons with physical disabilities, or persons with co-occurring disorders of mental illness and substance abuse/dependence or mental retardation/developmental disability and substance abuse/dependence. Phone: [phone]  Address:   [facility] [place] [facility] will also offer the following outpatient services: (Monday through Friday 8am-5pm)   Partial Hospitalization Program (PHP) Substance Abuse Intensive Outpatient Program (SA-IOP) Group Therapy Medication Management Peer Living Room We also provide (24/7):  Assessments: Our mental health clinician and providers will conduct a focused mental health evaluation, assessing for immediate  safety concerns and further mental health needs. Referral: Our team will provide resources and help connect to community based mental health treatment, when indicated, including psychotherapy, psychiatry, and other specialized behavioral health or substance use disorder services (for those not already in treatment). Transitional Care: Our team providers in person bridging and/or telephonic follow-up during the patient's transition to outpatient services.  [facility] 24-Hour Call Center: [phone] Behavioral Health Crisis Line: [phone]  

## 2022-03-30 NOTE — ED Notes (Signed)
Patient A&Ox4. Denies intent to harm self/others when asked. Patient denies VH. Patient admits to [facility] "saying I shouldn't be here".  Patient denies any physical complaints when asked. No acute distress noted. Support and encouragement provided. Routine safety checks conducted according to facility protocol. Encouraged patient to notify staff if thoughts of harm toward self or others arise. Patient verbalize understanding and agreement. Will continue to monitor for safety.

## 2022-03-31 ENCOUNTER — Encounter: Payer: Self-pay | Admitting: *Deleted

## 2022-03-31 NOTE — Congregational Nurse Program (Signed)
  Dept: [phone]   Congregational Nurse Program Note  Date of Encounter: 03/31/2022  Past Medical History: Past Medical History:  Diagnosis Date   Alcoholism (HCC)    ANEMIA, VITAMIN B12 DEFICIENCY 11/11/2007   Documented low 175 on 09/2007; received 6 yrs of IM therapy; switched to po 05/2013    Anxiety    Breast pain, left 06/02/2021   Cardiac arrest (HCC) 01/16/2013   witnessed, shockable rhythm, initally low EF, no CAD at cath. Drug screen negative.   Chest pain    a. 04/2009 Echo: EF 55-60%, Gr1 DD, Mild MR.   Depression    Duodenitis    secondary to H pylori(treatment completed 05/2006), +/- NSAIDS   Duodenitis    a. 05/2006 2/2 H pylori +/- NSAIDS   DVT (deep venous thrombosis) (HCC)    a. 04/2009 - treated with coumadin x 8 mos.   ETOH abuse    Heart murmur    History of cocaine abuse (HCC)    History of CVA (cerebrovascular accident)    Hypertension    Left arm pain 06/11/2017   PE 05/16/2009   CT angio positive for PE in 07/10, treated with coumadin for 8 months.    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 04/2009 - treated with coumadin x 8 mos.   Rectal bleeding 2007   internal hemmorhoids by colonoscopy in 04/2006, Bx neg for IBD   Rectal bleeding    a. int hemorrhoids by colonoscopy in 04/2006, Bx neg for IBD   Right foot pain 05/05/2020   Seizures (HCC)    Shortness of breath 05/28/2020   Stroke (HCC)    Tobacco abuse    Trochanteric bursitis of left hip 05/22/2016    Encounter Details:  CNP Questionnaire - 03/31/22 1254       Questionnaire   Do you give verbal consent to treat you today? Yes    Location Patient Served  [facility]    Visit Setting Church or Organization    Patient Status Unknown   living with daughter   Insurance Medicaid   for family planning   Insurance Referral N/A    Medication N/A    Medical Provider Yes    Screening Referrals N/A    Medical Referral [facility]   IOP program   Medical Appointment Made  [facility]   [place] or Wed walk in btw 8am -11am Opens at 7:30   Food N/A    Transportation N/A   patient given 2 bus tickets   Housing/Utilities N/A    Interpersonal Safety N/A    Intervention Support;Navigate Healthcare System    ED Visit Averted N/A    Life-Saving Intervention Made N/A            Client came to [facility] after recently staying at [facility]. She reports she has [facility] her [facility] and wants to get in a group setting for therapy. Contacted [facility] and referred to IOP program. She reports she would like the five day a week OP program for extra support and structure. Client denies si and hi. A counselor is to call client back for intake information. Offered support and encouragement.  Tobin Witucki W RN CN

## 2022-04-04 NOTE — Chronic Care Management (AMB) (Signed)
  Care Management   Outreach Note  04/04/2022 Name: [name] MRN: [id number] DOB: 1961/07/07  Referred by: Masters, Florentina Addison, DO Reason for referral : Care Coordination (Initial outreach to schedule referral with BSW)   A second unsuccessful telephone outreach was attempted today. The patient was referred to the case management team for assistance with care management and care coordination.   Follow Up Plan:   Follow Up Plan:  The care management team will reach out to the patient again over the next 7 days.  If patient returns call to provider office, please advise to call Embedded Care Management Care Guide Misty Stanley * at [phone]*.  Gwenevere Ghazi  Care Guide, Embedded Care Coordination [place]  Care Management  Direct Dial: [phone]   Gwenevere Ghazi  Care Guide, Embedded Care Coordination [facility]  Care Management  Direct Dial: [phone]

## 2022-04-11 NOTE — Chronic Care Management (AMB) (Signed)
  Care Coordination Note  04/11/2022 Name: [name] MRN: [id number] DOB: 1961/01/16  [name] is a 61 y.o. year old female who is a primary care patient of Masters, Florentina Addison, DO and is actively engaged with the care management team. I reached out to [name] by phone today to assist with re-scheduling an initial visit with the BSW  Follow up plan: A third unsuccessful telephone outreach attempt made. A HIPAA compliant phone message was left for the patient providing contact information and requesting a return call. Unable to make contact on outreach attempts x 3. PCP Masters, Florentina Addison, DO notified via routed documentation in medical record. We have been unable to make contact with the patient for follow up. The care management team is available to follow up with the patient after provider conversation with the patient regarding recommendation for care management engagement and subsequent re-referral to the care management team.   [facility] Guide, Embedded Care Coordination [facility]  Care Management  Direct Dial: [phone]

## 2022-07-21 ENCOUNTER — Encounter: Payer: Medicaid Other | Admitting: Nurse Practitioner

## 2022-07-21 NOTE — Progress Notes (Signed)
NO SHOW

## 2022-08-01 ENCOUNTER — Ambulatory Visit (HOSPITAL_COMMUNITY)
Admission: EM | Admit: 2022-08-01 | Discharge: 2022-08-01 | Disposition: A | Discharge status: Home / Self Care | Payer: No Typology Code available for payment source | Attending: Family Medicine | Admitting: Family Medicine

## 2022-08-01 ENCOUNTER — Ambulatory Visit (INDEPENDENT_AMBULATORY_CARE_PROVIDER_SITE_OTHER): Payer: No Typology Code available for payment source

## 2022-08-01 ENCOUNTER — Encounter (HOSPITAL_COMMUNITY): Payer: Self-pay | Admitting: Emergency Medicine

## 2022-08-01 DIAGNOSIS — R0602 Shortness of breath: Secondary | ICD-10-CM

## 2022-08-01 DIAGNOSIS — J40 Bronchitis, not specified as acute or chronic: Secondary | ICD-10-CM

## 2022-08-01 DIAGNOSIS — R062 Wheezing: Secondary | ICD-10-CM

## 2022-08-01 DIAGNOSIS — R059 Cough, unspecified: Secondary | ICD-10-CM

## 2022-08-01 MED ORDER — IPRATROPIUM-ALBUTEROL 0.5-2.5 (3) MG/3ML IN SOLN
RESPIRATORY_TRACT | Status: AC
  Filled 2022-08-01: qty 3

## 2022-08-01 MED ORDER — LEVOFLOXACIN 500 MG PO TABS: 500.0000 mg | ORAL_TABLET | Freq: Every day | ORAL | 0 refills | Status: AC

## 2022-08-01 MED ORDER — ALBUTEROL SULFATE HFA 108 (90 BASE) MCG/ACT IN AERS
2.0000 | INHALATION_SPRAY | RESPIRATORY_TRACT | 0 refills | Status: DC | PRN
Start: 2022-08-01 — End: 2023-01-03

## 2022-08-01 MED ORDER — PREDNISONE 50 MG PO TABS: 50.0000 mg | ORAL_TABLET | Freq: Every day | ORAL | 0 refills | Status: AC

## 2022-08-01 MED ORDER — IPRATROPIUM-ALBUTEROL 0.5-2.5 (3) MG/3ML IN SOLN
3.0000 mL | Freq: Once | RESPIRATORY_TRACT | Status: AC
  Administered 2022-08-01: 3 mL via RESPIRATORY_TRACT

## 2022-08-01 NOTE — ED Provider Notes (Signed)
[facility]    CSN: [id number] Arrival date & time: 08/01/22  1018      History   Chief Complaint Chief Complaint  Patient presents with   Shortness of Breath    HPI [name] is a 61 y.o. female.   Patient is here for uri symptoms  Started with symptoms last week with coughing, chest congestion, slight sob.  Also with sinus congestion.  Also with fever/chills.  She [facility] and was given an abx and a steroid (doxy and prednisone).  She finished both of those medications without help.  She feels her symtpoms have worsened.  Still with cough, feeling sob.  She feels like she is wheezing.  Some chills, no fevers. No xray done, no testing done.  She does smoke, but has not during this illness.  She did take mucinex last night with some help.  No inhalers at home.  No hx of copd or asthma.        Past Medical History:  Diagnosis Date   Alcoholism ([place])    ANEMIA, VITAMIN B12 DEFICIENCY 11/11/2007   Documented low 175 on 09/2007; received 6 yrs of IM therapy; switched to po 05/2013    Anxiety    Breast pain, left 06/02/2021   Cardiac arrest ([place]) 01/16/2013   witnessed, shockable rhythm, initally low EF, no CAD at cath. Drug screen negative.   Chest pain    a. 04/2009 Echo: EF 55-60%, Gr1 DD, Mild MR.   Depression    Duodenitis    secondary to H pylori(treatment completed 05/2006), +/- NSAIDS   Duodenitis    a. 05/2006 2/2 H pylori +/- NSAIDS   DVT (deep venous thrombosis) ([place])    a. 04/2009 - treated with coumadin x 8 mos.   ETOH abuse    Heart murmur    History of cocaine abuse ([place])    History of CVA (cerebrovascular accident)    Hypertension    Left arm pain 06/11/2017   PE 05/16/2009   CT angio positive for PE in 07/10, treated with coumadin for 8 months.    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 04/2009 - treated with coumadin x 8 mos.   Rectal bleeding 2007   internal hemmorhoids by colonoscopy in  04/2006, Bx neg for IBD   Rectal bleeding    a. int hemorrhoids by colonoscopy in 04/2006, Bx neg for IBD   Right foot pain 05/05/2020   Seizures ([place])    Shortness of breath 05/28/2020   Stroke ([place])    Tobacco abuse    Trochanteric bursitis of left hip 05/22/2016    Patient Active Problem List   Diagnosis Date Noted   History of alcohol abuse 03/20/2022   Onychomycosis 03/20/2022   Uninsured 03/20/2022   Blurry vision 03/20/2022   HFrEF (heart failure with reduced ejection fraction) ([place]) 03/20/2022   History of CVA with residual deficit 03/20/2022   Cervical radiculopathy 03/20/2022   Normocytic anemia 11/23/2017   Unintentional weight loss 07/26/2016   Insomnia 07/26/2016   Vitamin D deficiency 09/23/2013   H/O seasonal allergies 02/28/2013   GERD (gastroesophageal reflux disease) 02/28/2013   Nonischemic cardiomyopathy, LBBB (likely d/t ETOH abuse)  01/19/2013   History of VFib cardiac arrest 2014 01/16/2013   Tobacco use 04/08/2012   Health care maintenance 01/02/2011   Depression 05/09/2007   Polysubstance abuse (with elevated transaminases)  05/09/2007    Past Surgical History:  Procedure Laterality  Date   CESAREAN SECTION     LEFT HEART CATHETERIZATION WITH CORONARY ANGIOGRAM N/A 06/23/2014   Procedure: LEFT HEART CATHETERIZATION WITH CORONARY ANGIOGRAM;  Surgeon: Larey Dresser, MD; No CAD    OB History   No obstetric history on file.      Home Medications    Prior to Admission medications   Medication Sig Start Date End Date Taking? Authorizing Provider  Acetaminophen Extra Strength 500 MG CAPS Take 1 capsule by mouth 3 (three) times daily. 06/19/22   [provider]  aspirin EC 81 MG tablet Take 81 mg by mouth daily. Swallow whole.    [provider]  atorvastatin (LIPITOR) 40 MG tablet Take 1 tablet (40 mg total) by mouth daily. 03/20/22   Wayland Denis, MD  carvedilol (COREG) 6.25 MG tablet Take 1 tablet (6.25 mg total) by mouth 2 (two)  times daily. 03/20/22   Wayland Denis, MD  Cholecalciferol 1000 units capsule Take 1 capsule (1,000 Units total) by mouth daily. 07/04/17   Santos-Sanchez, Merlene Morse, MD  gabapentin (NEURONTIN) 300 MG capsule Take 1 capsule (300 mg total) by mouth 3 (three) times daily. 03/20/22   Wayland Denis, MD  LASIX 20 MG tablet Take 20 mg by mouth daily. 05/22/22   [provider]  nitroGLYCERIN (NITROSTAT) 0.4 MG SL tablet Place 1 tablet (0.4 mg total) under the tongue every 5 (five) minutes as needed for chest pain. 03/20/22   Wayland Denis, MD  omeprazole (PRILOSEC) 20 MG capsule Take 1 capsule (20 mg total) by mouth daily. 03/20/22 03/20/23  Wayland Denis, MD  traZODone (DESYREL) 50 MG tablet Take 1 tablet (50 mg total) by mouth at bedtime as needed for sleep. 03/30/22   Lucky Rathke, FNP  vitamin B-12 (CYANOCOBALAMIN) 1000 MCG tablet Take 1 tablet (1,000 mcg total) by mouth daily. 07/26/16   Asencion Partridge, MD    Family History Family History  Problem Relation Age of Onset   Breast cancer Mother 59   Heart disease Mother    Diabetes Mother    CAD Mother    Heart attack Mother    Hypertension Mother    Heart disease Father    Colon cancer Father    CAD Father    Breast cancer Sister 62   Diabetes Sister    Hypertension Sister    Heart disease Brother    Diabetes Brother    CAD Brother    Coronary artery disease Other        1 st degree female relative <60 and female <50   Diabetes type II Other        1 st degree relative   Breast cancer Niece 39    Social History Social History   Tobacco Use   Smoking status: Some Days    Packs/day: 0.10    Types: Cigarettes   Smokeless tobacco: Never   Tobacco comments:    smokes about 1-2 cigs/day  Vaping Use   Vaping Use: Never used  Substance Use Topics   Alcohol use: Yes    Comment: beer   Drug use: Yes    Types: Marijuana     Allergies   Iodinated contrast media and Omnipaque [iohexol]   Review of Systems Review of Systems   Constitutional: Negative.   HENT:  Positive for congestion and rhinorrhea.   Respiratory:  Positive for cough, shortness of breath and wheezing.   Cardiovascular: Negative.   Gastrointestinal: Negative.   Genitourinary: Negative.   Musculoskeletal: Negative.  Psychiatric/Behavioral: Negative.       Physical Exam Triage Vital Signs ED Triage Vitals  Enc Vitals Group     BP 08/01/22 1049 (!) 146/76     Pulse Rate 08/01/22 1049 71     Resp 08/01/22 1049 20     Temp 08/01/22 1049 98.1 F (36.7 C)     Temp src --      SpO2 08/01/22 1049 100 %     Weight --      Height --      Head Circumference --      Peak Flow --      Pain Score 08/01/22 1047 0     Pain Loc --      Pain Edu? --      Excl. in GC? --    No data found.  Updated Vital Signs BP (!) 146/76   Pulse 71   Temp 98.1 F (36.7 C)   Resp 20   LMP 11/02/2010   SpO2 100%   Visual Acuity Right Eye Distance:   Left Eye Distance:   Bilateral Distance:    Right Eye Near:   Left Eye Near:    Bilateral Near:     Physical Exam Constitutional:      Appearance: She is well-developed.  HENT:     Head: Normocephalic.  Cardiovascular:     Rate and Rhythm: Normal rate and regular rhythm.  Pulmonary:     Effort: Pulmonary effort is normal.     Breath sounds: Examination of the right-middle field reveals rhonchi. Examination of the right-lower field reveals rhonchi. Wheezing and rhonchi present.  Musculoskeletal:     Cervical back: Normal range of motion.  Skin:    General: Skin is warm.  Neurological:     General: No focal deficit present.     Mental Status: She is alert.  Psychiatric:        Mood and Affect: Mood normal.      UC Treatments / Results  Labs (all labs ordered are listed, but only abnormal results are displayed) Labs Reviewed - No data to display  EKG   Radiology DG Chest 2 View  Result Date: 08/01/2022 CLINICAL DATA:  Cough, wheezing, shortness of breath EXAM: CHEST - 2 VIEW  COMPARISON:  10/12/2016 FINDINGS: Cardiac size is within normal limits. There are no signs of pulmonary edema or focal pulmonary consolidation. There is no pleural effusion or pneumothorax. IMPRESSION: No active cardiopulmonary disease. Electronically Signed   By: Ernie Avena M.D.   On: 08/01/2022 11:44    Procedures Procedures (including critical care time)  Medications Ordered in UC Medications  ipratropium-albuterol (DUONEB) 0.5-2.5 (3) MG/3ML nebulizer solution 3 mL (3 mLs Nebulization Given 08/01/22 1109)   After neb treatment patient had less wheezing, and better air movement.   Initial Impression / Assessment and Plan / UC Course  I have reviewed the triage vital signs and the nursing notes.  Pertinent labs & imaging results that were available during my care of the patient were reviewed by me and considered in my medical decision making (see chart for details).     Final Clinical Impressions(s) / UC Diagnoses   Final diagnoses:  Wheezing  Shortness of breath  Bronchitis     Discharge Instructions      You were seen today for bronchitis.  Your xray was normal.  I have sent out an antibiotic, steroid, and inhaler to use for your symptoms.  If you continue to feel unwell, then  please return or go to the ER for further evaluation.     ED Prescriptions     Medication Sig Dispense Auth. Provider   predniSONE (DELTASONE) 50 MG tablet Take 1 tablet (50 mg total) by mouth daily for 5 days. 5 tablet Brookelynn Hamor, MD   albuterol (VENTOLIN HFA) 108 (90 Base) MCG/ACT inhaler Inhale 2 puffs into the lungs every 4 (four) hours as needed for wheezing or shortness of breath. 1 each Nayelis Bonito, MD   levofloxacin (LEVAQUIN) 500 MG tablet Take 1 tablet (500 mg total) by mouth daily for 7 days. 7 tablet Rondel Oh, MD      PDMP not reviewed this encounter.   Rondel Oh, MD 08/01/22 [phone]

## 2022-08-01 NOTE — ED Triage Notes (Signed)
Pt c/o of congestion and difficulty breathing that started last week. Pt was seen last week and given medication (steroid and antibiotic) but it has not helped.

## 2022-08-01 NOTE — Discharge Instructions (Signed)
You were seen today for bronchitis.  Your xray was normal.  I have sent out an antibiotic, steroid, and inhaler to use for your symptoms.  If you continue to feel unwell, then please return or go to the ER for further evaluation.

## 2022-11-06 ENCOUNTER — Ambulatory Visit (HOSPITAL_COMMUNITY)
Admission: EM | Admit: 2022-11-06 | Discharge: 2022-11-06 | Disposition: A | Discharge status: Home / Self Care | Payer: Medicaid Other | Attending: Emergency Medicine | Admitting: Emergency Medicine

## 2022-11-06 ENCOUNTER — Encounter (HOSPITAL_COMMUNITY): Payer: Self-pay

## 2022-11-06 DIAGNOSIS — K2900 Acute gastritis without bleeding: Secondary | ICD-10-CM

## 2022-11-06 DIAGNOSIS — K253 Acute gastric ulcer without hemorrhage or perforation: Secondary | ICD-10-CM

## 2022-11-06 MED ORDER — ALUM & MAG HYDROXIDE-SIMETH 200-200-20 MG/5ML PO SUSP
30.0000 mL | Freq: Once | ORAL | Status: AC
  Administered 2022-11-06: 30 mL via ORAL

## 2022-11-06 MED ORDER — LIDOCAINE VISCOUS HCL 2 % MT SOLN
OROMUCOSAL | Status: AC
  Filled 2022-11-06: qty 15

## 2022-11-06 MED ORDER — ALUM & MAG HYDROXIDE-SIMETH 200-200-20 MG/5ML PO SUSP
ORAL | Status: AC
  Filled 2022-11-06: qty 30

## 2022-11-06 MED ORDER — OMEPRAZOLE 20 MG PO CPDR: 20.0000 mg | DELAYED_RELEASE_CAPSULE | Freq: Every day | ORAL | 0 refills | Status: DC

## 2022-11-06 MED ORDER — OMEPRAZOLE 40 MG PO CPDR: 40.0000 mg | DELAYED_RELEASE_CAPSULE | Freq: Every day | ORAL | 0 refills | Status: DC

## 2022-11-06 MED ORDER — LIDOCAINE VISCOUS HCL 2 % MT SOLN
15.0000 mL | Freq: Once | OROMUCOSAL | Status: AC
  Administered 2022-11-06: 15 mL via OROMUCOSAL

## 2022-11-06 NOTE — ED Provider Notes (Signed)
[place]    CSN: [id number] Arrival date & time: 11/06/22  1045     History   Chief Complaint Chief Complaint  Patient presents with   Abdominal Pain    HPI [name] is a 62 y.o. female.  Presents with several month history of abdominal pain, on and off Reports over the last week it has worsened to every day She feels it immediately after eating, keeps her up in the middle the night 8/10 pain today  She does report increased stress recently due to loss of a friend.  Had been coping with alcohol use. Has since stopped.  No fevers.  No vomiting.  She denies any blood in stool or dark stool Has not tried medications. Has increased water intake recently  History of duodenitis d/t h.pylori (2007)  Past Medical History:  Diagnosis Date   Alcoholism ([place])    ANEMIA, VITAMIN B12 DEFICIENCY 11/11/2007   Documented low 175 on 09/2007; received 6 yrs of IM therapy; switched to po 05/2013    Anxiety    Breast pain, left 06/02/2021   Cardiac arrest ([place]) 01/16/2013   witnessed, shockable rhythm, initally low EF, no CAD at cath. Drug screen negative.   Chest pain    a. 04/2009 Echo: EF 55-60%, Gr1 DD, Mild MR.   Depression    Duodenitis    secondary to H pylori(treatment completed 05/2006), +/- NSAIDS   Duodenitis    a. 05/2006 2/2 H pylori +/- NSAIDS   DVT (deep venous thrombosis) ([place])    a. 04/2009 - treated with coumadin x 8 mos.   ETOH abuse    Heart murmur    History of cocaine abuse ([place])    History of CVA (cerebrovascular accident)    Hypertension    Left arm pain 06/11/2017   PE 05/16/2009   CT angio positive for PE in 07/10, treated with coumadin for 8 months.    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 04/2009 - treated with coumadin x 8 mos.   Rectal bleeding 2007   internal hemmorhoids by colonoscopy in 04/2006, Bx neg for IBD   Rectal bleeding    a. int hemorrhoids by colonoscopy in 04/2006, Bx neg for IBD   Right foot pain 05/05/2020    Seizures ([place])    Shortness of breath 05/28/2020   Stroke ([place])    Tobacco abuse    Trochanteric bursitis of left hip 05/22/2016    Patient Active Problem List   Diagnosis Date Noted   History of alcohol abuse 03/20/2022   Onychomycosis 03/20/2022   Uninsured 03/20/2022   Blurry vision 03/20/2022   HFrEF (heart failure with reduced ejection fraction) ([place]) 03/20/2022   History of CVA with residual deficit 03/20/2022   Cervical radiculopathy 03/20/2022   Normocytic anemia 11/23/2017   Unintentional weight loss 07/26/2016   Insomnia 07/26/2016   Vitamin D deficiency 09/23/2013   H/O seasonal allergies 02/28/2013   GERD (gastroesophageal reflux disease) 02/28/2013   Nonischemic cardiomyopathy, LBBB (likely d/t ETOH abuse)  01/19/2013   History of VFib cardiac arrest 2014 01/16/2013   Tobacco use 04/08/2012   Health care maintenance 01/02/2011   Depression 05/09/2007   Polysubstance abuse (with elevated transaminases)  05/09/2007    Past Surgical History:  Procedure Laterality Date   CESAREAN SECTION     LEFT HEART CATHETERIZATION WITH CORONARY ANGIOGRAM N/A 06/23/2014   Procedure: LEFT HEART CATHETERIZATION WITH CORONARY ANGIOGRAM;  Surgeon: Larey Dresser, MD; No  CAD    OB History   No obstetric history on file.      Home Medications    Prior to Admission medications   Medication Sig Start Date End Date Taking? Authorizing Provider  Acetaminophen Extra Strength 500 MG CAPS Take 1 capsule by mouth 3 (three) times daily. 06/19/22   [provider]  albuterol (VENTOLIN HFA) 108 (90 Base) MCG/ACT inhaler Inhale 2 puffs into the lungs every 4 (four) hours as needed for wheezing or shortness of breath. 08/01/22   Rondel Oh, MD  aspirin EC 81 MG tablet Take 81 mg by mouth daily. Swallow whole.    [provider]  atorvastatin (LIPITOR) 40 MG tablet Take 1 tablet (40 mg total) by mouth daily. 03/20/22   Wayland Denis, MD  carvedilol (COREG) 6.25 MG tablet  Take 1 tablet (6.25 mg total) by mouth 2 (two) times daily. 03/20/22   Wayland Denis, MD  Cholecalciferol 1000 units capsule Take 1 capsule (1,000 Units total) by mouth daily. 07/04/17   Santos-Sanchez, Merlene Morse, MD  gabapentin (NEURONTIN) 300 MG capsule Take 1 capsule (300 mg total) by mouth 3 (three) times daily. 03/20/22   Wayland Denis, MD  LASIX 20 MG tablet Take 20 mg by mouth daily. 05/22/22   [provider]  nitroGLYCERIN (NITROSTAT) 0.4 MG SL tablet Place 1 tablet (0.4 mg total) under the tongue every 5 (five) minutes as needed for chest pain. 03/20/22   Wayland Denis, MD  omeprazole (PRILOSEC) 40 MG capsule Take 1 capsule (40 mg total) by mouth daily for 14 days. 11/06/22 11/20/22  Izaha Shughart, Wells Guiles, PA-C  traZODone (DESYREL) 50 MG tablet Take 1 tablet (50 mg total) by mouth at bedtime as needed for sleep. 03/30/22   Lucky Rathke, FNP  vitamin B-12 (CYANOCOBALAMIN) 1000 MCG tablet Take 1 tablet (1,000 mcg total) by mouth daily. 07/26/16   Asencion Partridge, MD    Family History Family History  Problem Relation Age of Onset   Breast cancer Mother 35   Heart disease Mother    Diabetes Mother    CAD Mother    Heart attack Mother    Hypertension Mother    Heart disease Father    Colon cancer Father    CAD Father    Breast cancer Sister 74   Diabetes Sister    Hypertension Sister    Heart disease Brother    Diabetes Brother    CAD Brother    Coronary artery disease Other        1 st degree female relative <60 and female <50   Diabetes type II Other        1 st degree relative   Breast cancer Niece 24    Social History Social History   Tobacco Use   Smoking status: Some Days    Packs/day: 0.10    Types: Cigarettes   Smokeless tobacco: Never   Tobacco comments:    smokes about 1-2 cigs/day  Vaping Use   Vaping Use: Never used  Substance Use Topics   Alcohol use: Yes    Comment: beer   Drug use: Yes    Types: Marijuana     Allergies   Iodinated contrast media and  Omnipaque [iohexol]   Review of Systems Review of Systems  Gastrointestinal:  Positive for abdominal pain.   As per HPI  Physical Exam Triage Vital Signs ED Triage Vitals  Enc Vitals Group     BP 11/06/22 1407 (!) 151/73  Pulse Rate 11/06/22 1407 69     Resp 11/06/22 1407 12     Temp 11/06/22 1407 98.4 F (36.9 C)     Temp Source 11/06/22 1407 Oral     SpO2 11/06/22 1407 95 %     Weight --      Height --      Head Circumference --      Peak Flow --      Pain Score 11/06/22 1409 8     Pain Loc --      Pain Edu? --      Excl. in [place]? --    No data found.  Updated Vital Signs BP (!) 151/73 (BP Location: Left Arm)   Pulse 69   Temp 98.4 F (36.9 C) (Oral)   Resp 12   LMP 11/02/2010   SpO2 95%    Physical Exam Vitals and nursing note reviewed.  Constitutional:      Appearance: Normal appearance.  HENT:     Mouth/Throat:     Mouth: Mucous membranes are moist.     Pharynx: Oropharynx is clear.  Eyes:     Conjunctiva/sclera: Conjunctivae normal.  Cardiovascular:     Rate and Rhythm: Normal rate and regular rhythm.     Heart sounds: Normal heart sounds.  Pulmonary:     Effort: Pulmonary effort is normal. No respiratory distress.     Breath sounds: Normal breath sounds.  Abdominal:     General: Bowel sounds are normal.     Palpations: Abdomen is soft.     Tenderness: There is abdominal tenderness in the epigastric area. There is no right CVA tenderness, left CVA tenderness, guarding or rebound.  Musculoskeletal:        General: Normal range of motion.  Skin:    General: Skin is warm and dry.  Neurological:     Mental Status: She is alert and oriented to person, place, and time.     UC Treatments / Results  Labs (all labs ordered are listed, but only abnormal results are displayed) Labs Reviewed - No data to display  EKG  Radiology No results found.  Procedures Procedures   Medications Ordered in UC Medications  alum & mag hydroxide-simeth  (MAALOX/MYLANTA) 200-200-20 MG/5ML suspension 30 mL (30 mLs Oral Given 11/06/22 1455)  lidocaine (XYLOCAINE) 2 % viscous mouth solution 15 mL (15 mLs Mouth/Throat Given 11/06/22 1455)    Initial Impression / Assessment and Plan / UC Course  I have reviewed the triage vital signs and the nursing notes.  Pertinent labs & imaging results that were available during my care of the patient were reviewed by me and considered in my medical decision making (see chart for details).  Tender epigastric. Abd pain improved with GI cocktail Suspicion for gastric ulcer given symptoms immediately after eating Omeprazole daily for 2 weeks, bland diet, close follow up with GI. Patient reports she will call them tomorrow to set up appointment.  Discussed stress and alcohol may have contributed to etiology. Provided list of foods to try/avoid Strict ED precautions, discussed signs of bleed. Patient agrees to plan  Final Clinical Impressions(s) / UC Diagnoses   Final diagnoses:  Acute gastritis without hemorrhage, unspecified gastritis type  Acute gastric ulcer without hemorrhage or perforation     Discharge Instructions      Please take the omeprazole once daily for the next 2 weeks  Try bland diet as discussed. There are foods to try and avoid attached.  Please call the  stomach specialists to make an appointment for evaluation. Please go to the emergency department if symptoms worsen.    ED Prescriptions     Medication Sig Dispense Auth. Provider   omeprazole (PRILOSEC) 20 MG capsule  (Status: Discontinued) Take 1 capsule (20 mg total) by mouth daily for 14 days. 14 capsule Edna Grover, PA-C   omeprazole (PRILOSEC) 40 MG capsule Take 1 capsule (40 mg total) by mouth daily for 14 days. 14 capsule Jaileigh Weimer, Lurena Joiner, PA-C      PDMP not reviewed this encounter.   Tymira Horkey, Lurena Joiner, [place] 11/06/22 [id number]

## 2022-11-06 NOTE — ED Triage Notes (Signed)
Pt is here for stomach pain xfew months, as per pt stated pain increased  within the last 7 days . Pt states she mostly feels the pain after eating .

## 2022-11-06 NOTE — Discharge Instructions (Addendum)
Please take the omeprazole once daily for the next 2 weeks  Try bland diet as discussed. There are foods to try and avoid attached.  Please call the stomach specialists to make an appointment for evaluation. Please go to the emergency department if symptoms worsen.

## 2022-11-14 NOTE — Progress Notes (Signed)
CC: Hand weakness  HPI:   [name] is a 62 y.o. female with a history of HFrEF, GERD, CVA x4, cervical radiculopathy, depression, and alcohol-tobacco use who presents for chief complaint of hand weakness. She was last seen at [facility] in 02-2022.       Past Medical History:  Diagnosis Date   Alcoholism ([place])    ANEMIA, VITAMIN B12 DEFICIENCY 11/11/2007   Documented low 175 on 09/2007; received 6 yrs of IM therapy; switched to po 05/2013    Anxiety    Breast pain, left 06/02/2021   Cardiac arrest ([place]) 01/16/2013   witnessed, shockable rhythm, initally low EF, no CAD at cath. Drug screen negative.   Chest pain    a. 04/2009 Echo: EF 55-60%, Gr1 DD, Mild MR.   Depression    Duodenitis    secondary to H pylori(treatment completed 05/2006), +/- NSAIDS   Duodenitis    a. 05/2006 2/2 H pylori +/- NSAIDS   DVT (deep venous thrombosis) ([place])    a. 04/2009 - treated with coumadin x 8 mos.   ETOH abuse    Heart murmur    History of cocaine abuse ([place])    History of CVA (cerebrovascular accident)    Hypertension    Left arm pain 06/11/2017   PE 05/16/2009   CT angio positive for PE in 07/10, treated with coumadin for 8 months.    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 04/2009 - treated with coumadin x 8 mos.   Rectal bleeding 2007   internal hemmorhoids by colonoscopy in 04/2006, Bx neg for IBD   Rectal bleeding    a. int hemorrhoids by colonoscopy in 04/2006, Bx neg for IBD   Right foot pain 05/05/2020   Seizures ([place])    Shortness of breath 05/28/2020   Stroke ([place])    Tobacco abuse    Trochanteric bursitis of left hip 05/22/2016     Review of Systems:    Reports hand numbness, tingling, weakness Denies fever, trauma, recent illness, pain   Physical Exam:  Vitals:   11/15/22 0830  BP: 123/75  Pulse: 73  Temp: 98 F (36.7 C)  TempSrc: Oral  SpO2: 99%  Weight: 139 lb (63 kg)  Height: 5\' 4"  (1.626 m)    General:   awake and alert, sitting comfortably in  chair, cooperative, not in acute distress Lungs:   normal respiratory effort, breathing unlabored, symmetrical chest rise, no crackles or wheezing Cardiac:   regular rate and rhythm, normal S1 and S2, capillary refill <1 second, zero-to-trace pitting edema Gynecologic:   clear whitish discharge over cervix, no erythema or lesions visualized Musculoskeletal:  motor strength 5 /5 in BUEs including muscles innervated by median-radial-ulnar nerves, no obvious deformities or joint tenderness Neurologic:   oriented to person-place-time, moving all extremities, sensation to light touch intact but diminished in LUE, Spurling test negative bilaterally Psychiatric:   euthymic mood with congruent affect, intelligible speech    Assessment & Plan:   GERD (gastroesophageal reflux disease) Patient reports occasional burning epigastric pain. Previously on omeprazole, but was unable to refill prescription due to lack of insurance.  - Continue omeprazole as needed for symptoms    Weakness of both hands Patient describes experiencing hand weakness causing her to drop kitchen items and she has a difficult time opening canned jars. These symptoms started about three months ago and have slowly worsened since then. She also reports numbness L>R and pins-and-needles sensation in her  fingertips R>L. Denies pain, swelling, redness, and any neck or upper extremity trauma. She experiences some intermittent neck and head pains every once in a while. Since the onset of these symptoms, she has stopped working at [employer] out of concern that she will drop items in the cafeteria. Exam was notable only for decreased sensation in LEU. Strength 5/5 in BUEs including muscles innervated by median-ulnar-radial nerves. Cervical MRI in 2018 demonstrated minimal cervical disc bulging, negative for spinal or foraminal stenosis. Etiology remains unclear, though given interference with her employment, further diagnostic workup is  warranted.  - Referral to neurology for EMG and nerve conduction studies    Depression Patient describes a long history of depression and has gone through a lot in the past year including some family member deaths. She follows with [facility] and find this service somewhat helpful. Has been told that she may have bipolar disorder, but is unable to recall the name of her physician.  - Continue to follow with [facility] vision Patient has a history of myopia and previously used glasses to read, but has lost them and needs a new prescription. She recently obtained insurance coverage and is interested in a formal evaluation by ophthalmology.   - Referral to ophthalmology for ocular examination    Health care maintenance Patient is currently due for colonoscopy, shingles-DTaP vaccine, and Pap smear. Most recent Pap smear from 2020 positive for high-risk HPV strains.  - Pap smear performed today in the office, will update patient with results - Referral to gastroenterology for colonoscopy - Administered DTaP vaccine, encouraged patient to complete shingles vaccine at [place] abuse (with elevated transaminases)  Patient currently smokes 1-2 cigarettes per day, which is less than 3-4 cigarettes in 02-2022. She has also decreased the frequency with which she uses cocaine and marijuana. Last cocaine use was over three months ago. Commended patient on her efforts and successes in decreasing her substance use.  - Encourage decreasing substance use including tobacco further        See Encounters Tab for problem based charting.  Patient seen with Dr. Evette Doffing

## 2022-11-15 ENCOUNTER — Other Ambulatory Visit (HOSPITAL_COMMUNITY)
Admission: RE | Admit: 2022-11-15 | Discharge: 2022-11-15 | Disposition: A | Discharge status: Home / Self Care | Payer: Commercial Managed Care - HMO | Source: Ambulatory Visit | Attending: Student in an Organized Health Care Education/Training Program | Admitting: Student in an Organized Health Care Education/Training Program

## 2022-11-15 ENCOUNTER — Ambulatory Visit (INDEPENDENT_AMBULATORY_CARE_PROVIDER_SITE_OTHER): Payer: Commercial Managed Care - HMO | Admitting: Student

## 2022-11-15 ENCOUNTER — Encounter: Payer: Self-pay | Admitting: Student

## 2022-11-15 VITALS — BP 123/75 | HR 73 | Temp 98.0°F | Ht 64.0 in | Wt 139.0 lb

## 2022-11-15 DIAGNOSIS — K219 Gastro-esophageal reflux disease without esophagitis: Secondary | ICD-10-CM

## 2022-11-15 DIAGNOSIS — F32A Depression, unspecified: Secondary | ICD-10-CM

## 2022-11-15 DIAGNOSIS — Z1211 Encounter for screening for malignant neoplasm of colon: Secondary | ICD-10-CM

## 2022-11-15 DIAGNOSIS — Z0000 Encounter for general adult medical examination without abnormal findings: Secondary | ICD-10-CM

## 2022-11-15 DIAGNOSIS — R29898 Other symptoms and signs involving the musculoskeletal system: Secondary | ICD-10-CM | POA: Insufficient documentation

## 2022-11-15 DIAGNOSIS — R87619 Unspecified abnormal cytological findings in specimens from cervix uteri: Secondary | ICD-10-CM

## 2022-11-15 DIAGNOSIS — F1910 Other psychoactive substance abuse, uncomplicated: Secondary | ICD-10-CM | POA: Diagnosis not present

## 2022-11-15 DIAGNOSIS — Z23 Encounter for immunization: Secondary | ICD-10-CM | POA: Diagnosis not present

## 2022-11-15 DIAGNOSIS — H538 Other visual disturbances: Secondary | ICD-10-CM | POA: Diagnosis not present

## 2022-11-15 NOTE — Assessment & Plan Note (Signed)
Patient reports occasional burning epigastric pain. Previously on omeprazole, but was unable to refill prescription due to lack of insurance.  - Continue omeprazole as needed for symptoms

## 2022-11-15 NOTE — Assessment & Plan Note (Signed)
Patient has a history of myopia and previously used glasses to read, but has lost them and needs a new prescription. She recently obtained insurance coverage and is interested in a formal evaluation by ophthalmology.   - Referral to ophthalmology for ocular examination

## 2022-11-15 NOTE — Assessment & Plan Note (Signed)
Patient describes experiencing hand weakness causing her to drop kitchen items and she has a difficult time opening canned jars. These symptoms started about three months ago and have slowly worsened since then. She also reports numbness L>R and pins-and-needles sensation in her fingertips R>L. Denies pain, swelling, redness, and any neck or upper extremity trauma. She experiences some intermittent neck and head pains every once in a while. Since the onset of these symptoms, she has stopped working at [employer] out of concern that she will drop items in the cafeteria. Exam was notable only for decreased sensation in LEU. Strength 5/5 in BUEs including muscles innervated by median-ulnar-radial nerves. Cervical MRI in 2018 demonstrated minimal cervical disc bulging, negative for spinal or foraminal stenosis. Etiology remains unclear, though given interference with her employment, further diagnostic workup is warranted.  - Referral to neurology for EMG and nerve conduction studies

## 2022-11-15 NOTE — Assessment & Plan Note (Signed)
Patient is currently due for colonoscopy, shingles-DTaP vaccine, and Pap smear. Most recent Pap smear from 2020 positive for high-risk HPV strains.  - Pap smear performed today in the office, will update patient with results - Referral to gastroenterology for colonoscopy - Administered DTaP vaccine, encouraged patient to complete shingles vaccine at [facility]

## 2022-11-15 NOTE — Patient Instructions (Signed)
  Thank you, [name], for allowing [place] to provide your care today. Today we discussed . . .  > Hand Weakness       - we are sending you a referral to neurology who will evaluate your hands and help identify the cause of your weakness > Blurry Vision       - we are sending you a referral to ophthalmology who will evaluate your vision and can prescribe you a new pair of glasses > Polysubstance Use       - keep up the great work reducing your cigarette use and continue trying to bring your daily intake all the way down to zero > Depressive Disorder       - please continue to visit [facility] regularly for both your depressive and manic symptoms   I have ordered the following labs for you:  Lab Orders  No laboratory test(s) ordered today      Tests ordered today:  Pap smear, colonoscopy   Referrals ordered today:   Referral Orders  No referral(s) requested today      I have ordered the following medication/changed the following medications:   Stop the following medications: There are no discontinued medications.   Start the following medications: No orders of the defined types were placed in this encounter.     Follow up: 6 months    Remember:  Please schedule visits with neurology for your hand weakness, gastroenterology for your colonoscopy, and ophthalmology for your vision. We will see you back in the [facility] in about 24-months.   Should you have any questions or concerns please call the internal medicine clinic at [phone].     Roswell Nickel, MD [place]

## 2022-11-15 NOTE — Assessment & Plan Note (Signed)
Patient currently smokes 1-2 cigarettes per day, which is less than 3-4 cigarettes in 02-2022. She has also decreased the frequency with which she uses cocaine and marijuana. Last cocaine use was over three months ago. Commended patient on her efforts and successes in decreasing her substance use.  - Encourage decreasing substance use including tobacco further

## 2022-11-15 NOTE — Assessment & Plan Note (Signed)
Patient describes a long history of depression and has gone through a lot in the past year including some family member deaths. She follows with [facility] and find this service somewhat helpful. Has been told that she may have bipolar disorder, but is unable to recall the name of her physician.  - Continue to follow with [facility]

## 2022-11-16 NOTE — Progress Notes (Signed)
Internal Medicine Clinic Attending  I saw and evaluated the patient.  I personally confirmed the key portions of the history and exam documented by Dr. Jodi Mourning and I reviewed pertinent patient test results.  The assessment, diagnosis, and plan were formulated together and I agree with the documentation in the resident's note.   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 Her exam is mostly reassuring, with good strength. 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 Will need to rule out CIDP and other demyelinating conditions with NCS and EMG studies at neurology clinic.

## 2022-11-18 NOTE — Addendum Note (Signed)
Addended by: Serita Butcher on: 11/18/2022 06:28 PM   Modules accepted: Orders

## 2022-11-20 ENCOUNTER — Telehealth: Payer: Self-pay

## 2022-11-20 NOTE — Telephone Encounter (Signed)
Requesting test results, please call pt back.

## 2022-11-21 LAB — CYTOLOGY - PAP
Comment: NEGATIVE
Comment: NEGATIVE
Comment: NEGATIVE
Diagnosis: UNDETERMINED — AB
HPV 16: NEGATIVE
HPV 18 / 45: NEGATIVE
High risk HPV: POSITIVE — AB

## 2022-11-22 ENCOUNTER — Other Ambulatory Visit: Payer: Self-pay | Admitting: Student

## 2022-11-22 ENCOUNTER — Other Ambulatory Visit (HOSPITAL_COMMUNITY): Payer: Self-pay

## 2022-11-22 MED ORDER — METRONIDAZOLE 500 MG PO TABS
500.0000 mg | ORAL_TABLET | Freq: Two times a day (BID) | ORAL | 0 refills | Status: AC
  Filled 2022-11-22: qty 14, 7d supply, fill #0

## 2022-11-27 ENCOUNTER — Other Ambulatory Visit (HOSPITAL_COMMUNITY): Payer: Self-pay

## 2022-11-27 ENCOUNTER — Telehealth: Payer: Self-pay | Admitting: *Deleted

## 2022-11-27 NOTE — Telephone Encounter (Signed)
Patient calling stating she cannot afford any of her medicines.  She has Medicaid but does not have the money.  Not able to get the antibiotic that was prescribed.

## 2022-11-30 NOTE — Telephone Encounter (Signed)
Attempted to call patient regarding issues affording medication. No voicemail available.   Wanted to inform patient since she has medicaid coverage, the pharmacy should be able to waive her copay on her medications if she cannot afford them. Patient will just need to let the pharmacy know when she goes to pickup medication.

## 2023-01-03 ENCOUNTER — Ambulatory Visit (INDEPENDENT_AMBULATORY_CARE_PROVIDER_SITE_OTHER): Payer: Commercial Managed Care - HMO | Admitting: Student

## 2023-01-03 ENCOUNTER — Other Ambulatory Visit (HOSPITAL_COMMUNITY): Payer: Self-pay

## 2023-01-03 VITALS — BP 135/80 | HR 70 | Temp 98.2°F | Ht 64.0 in | Wt 141.3 lb

## 2023-01-03 DIAGNOSIS — M5412 Radiculopathy, cervical region: Secondary | ICD-10-CM | POA: Diagnosis not present

## 2023-01-03 DIAGNOSIS — K219 Gastro-esophageal reflux disease without esophagitis: Secondary | ICD-10-CM

## 2023-01-03 DIAGNOSIS — M79671 Pain in right foot: Secondary | ICD-10-CM

## 2023-01-03 MED ORDER — DICLOFENAC SODIUM 1 % EX GEL
2.0000 g | Freq: Four times a day (QID) | CUTANEOUS | 0 refills | Status: DC
  Filled 2023-01-03: qty 100, 14d supply, fill #0

## 2023-01-03 MED ORDER — GABAPENTIN 300 MG PO CAPS
300.0000 mg | ORAL_CAPSULE | Freq: Three times a day (TID) | ORAL | 3 refills | Status: DC
  Filled 2023-01-03: qty 270, 90d supply, fill #0

## 2023-01-03 MED ORDER — ALBUTEROL SULFATE HFA 108 (90 BASE) MCG/ACT IN AERS
2.0000 | INHALATION_SPRAY | RESPIRATORY_TRACT | 0 refills | Status: DC | PRN
  Filled 2023-01-03: qty 6.7, 25d supply, fill #0

## 2023-01-03 MED ORDER — OMEPRAZOLE 40 MG PO CPDR
40.0000 mg | DELAYED_RELEASE_CAPSULE | Freq: Every day | ORAL | 0 refills | Status: DC
  Filled 2023-01-03: qty 14, 14d supply, fill #0

## 2023-01-03 NOTE — Patient Instructions (Addendum)
Thank you so much for coming to the clinic today!   I am sending in a gel that will help with the pain on the right side, and I am also placing a referral to see the foot doctor.   If you have any questions please feel free to the call the clinic at anytime at [phone]. It was a pleasure seeing you!  Best, Dr. Sanjuana Mae

## 2023-01-04 NOTE — Assessment & Plan Note (Addendum)
Pt states that for the past few months she has had right foot pain, but recently has gotten much more bothersome. She denies any recent trauma to the area or falls. She describes the pain as dull, and only hurts when she walks. She denies any weakness in the foot. Of note, patient has residual left sided deficits after a CVA, and has been heavily favoring her right foot when walking, with a slight eversion. The pain is located on the lateral side of the right foot, and extends throughout the dorsal aspect of her foot. There is no appreciable erythema in the area, and is tender to palpation. On gait exam, pt walks with a heavy emphasis on her right foot with a slight eversion of her foot.   Her pain is likely due to overuse injury secondary to the fact that she is mostly weight bearing on the right side given hx of L sided weakness post-CVA. Stress fracture also on the differential list. Will treat with voltaren gel. Also placed referral to podiatry for consideration of orthotic to redistribute pressure points due to her her weakness on her left side.   Plan:  - Referral to podiatry  - Voltaren gel

## 2023-01-04 NOTE — Assessment & Plan Note (Signed)
Pt with history of GERD well controlled with omeprazole, will refill.

## 2023-01-04 NOTE — Progress Notes (Signed)
CC: Right foot pain  HPI:  [name] is a 62 y.o. female living with a history stated below and presents today for right foot pain. Please see problem based assessment and plan for additional details.  Past Medical History:  Diagnosis Date   Alcoholism ([place])    ANEMIA, VITAMIN B12 DEFICIENCY 11/11/2007   Documented low 175 on 09/2007; received 6 yrs of IM therapy; switched to po 05/2013    Anxiety    Breast pain, left 06/02/2021   Cardiac arrest ([place]) 01/16/2013   witnessed, shockable rhythm, initally low EF, no CAD at cath. Drug screen negative.   Chest pain    a. 04/2009 Echo: EF 55-60%, Gr1 DD, Mild MR.   Depression    Duodenitis    secondary to H pylori(treatment completed 05/2006), +/- NSAIDS   Duodenitis    a. 05/2006 2/2 H pylori +/- NSAIDS   DVT (deep venous thrombosis) ([place])    a. 04/2009 - treated with coumadin x 8 mos.   ETOH abuse    Heart murmur    History of cocaine abuse ([place])    History of CVA (cerebrovascular accident)    Hypertension    Left arm pain 06/11/2017   PE 05/16/2009   CT angio positive for PE in 07/10, treated with coumadin for 8 months.    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 04/2009 - treated with coumadin x 8 mos.   Rectal bleeding 2007   internal hemmorhoids by colonoscopy in 04/2006, Bx neg for IBD   Rectal bleeding    a. int hemorrhoids by colonoscopy in 04/2006, Bx neg for IBD   Right foot pain 05/05/2020   Seizures ([place])    Shortness of breath 05/28/2020   Stroke ([place])    Tobacco abuse    Trochanteric bursitis of left hip 05/22/2016    Current Outpatient Medications on File Prior to Visit  Medication Sig Dispense Refill   Acetaminophen Extra Strength 500 MG CAPS Take 1 capsule by mouth 3 (three) times daily.     aspirin EC 81 MG tablet Take 81 mg by mouth daily. Swallow whole.     atorvastatin (LIPITOR) 40 MG tablet Take 1 tablet (40 mg total) by mouth daily. 90 tablet 3   carvedilol (COREG) 6.25 MG tablet Take 1  tablet (6.25 mg total) by mouth 2 (two) times daily. 180 tablet 3   Cholecalciferol 1000 units capsule Take 1 capsule (1,000 Units total) by mouth daily. 90 capsule 3   LASIX 20 MG tablet Take 20 mg by mouth daily.     nitroGLYCERIN (NITROSTAT) 0.4 MG SL tablet Place 1 tablet (0.4 mg total) under the tongue every 5 (five) minutes as needed for chest pain. 25 tablet 1   traZODone (DESYREL) 50 MG tablet Take 1 tablet (50 mg total) by mouth at bedtime as needed for sleep. 30 tablet 0   vitamin B-12 (CYANOCOBALAMIN) 1000 MCG tablet Take 1 tablet (1,000 mcg total) by mouth daily. 30 tablet 2   No current facility-administered medications on file prior to visit.    Family History  Problem Relation Age of Onset   Breast cancer Mother 26   Heart disease Mother    Diabetes Mother    CAD Mother    Heart attack Mother    Hypertension Mother    Heart disease Father    Colon cancer Father    CAD Father    Breast cancer Sister 59   Diabetes  Sister    Hypertension Sister    Heart disease Brother    Diabetes Brother    CAD Brother    Coronary artery disease Other        1 st degree female relative <60 and female <50   Diabetes type II Other        1 st degree relative   Breast cancer Niece 78    Social History   Socioeconomic History   Marital status: Single    Spouse name: Not on file   Number of children: 2   Years of education: Not on file   Highest education level: 11th grade  Occupational History   Occupation: Unemployed  Tobacco Use   Smoking status: Some Days    Packs/day: 0.10    Types: Cigarettes   Smokeless tobacco: Never   Tobacco comments:    smokes about 1-2 cigs/day  Vaping Use   Vaping Use: Never used  Substance and Sexual Activity   Alcohol use: Yes    Comment: beer   Drug use: Yes    Types: Marijuana   Sexual activity: Not Currently    Birth control/protection: Post-menopausal  Other Topics Concern   Not on file  Social History Narrative   ** Merged  History Encounter **       ** Data from: 12/05/12 Enc Dept: [facility]   No cocaine or alcohol use since hospital discharge 05/19/2009.      10/13 update:  States she is smoking 1-2 cigarettes daily with at least 1 drink daily usually Brandy.                  ** Data from: 01/17/13 Enc Dept: [facility] [place] lives in [place] with a roommate.      Social Determinants of Health   Financial Resource Strain: Not on file  Food Insecurity: No Food Insecurity (08/02/2021)   Hunger Vital Sign    Worried About Running Out of Food in the Last Year: Never true    Ran Out of Food in the Last Year: Never true  Transportation Needs: Unmet Transportation Needs (08/02/2021)   PRAPARE - Hydrologist (Medical): Yes    Lack of Transportation (Non-Medical): Yes  Physical Activity: Not on file  Stress: Not on file  Social Connections: Not on file  Intimate Partner Violence: Not on file    Review of Systems: ROS negative except for what is noted on the assessment and plan.  Vitals:   01/03/23 1522 01/03/23 1601  BP: (!) 150/73 135/80  Pulse: 75 70  Temp: 98.2 F (36.8 C)   TempSrc: Oral   SpO2: 100%   Weight: 141 lb 4.8 oz (64.1 kg)   Height: '5\' 4"'$  (1.626 m)     Physical Exam: Constitutional: well-appearing female  in no acute distress Cardiovascular: regular rate and rhythm, no m/r/g Pulmonary/Chest: normal work of breathing on room air, lungs clear to auscultation bilaterally MSK: normal bulk and tone, right foot tender to palpation on dorsal aspect, heavily leaning on right foot during ambulation, no erythema visualized Neurological: alert & oriented x 3, 3/5 strength in LLE, 5/5 strength in RLE Skin: warm and dry   Assessment & Plan:   Right foot pain Pt states that for the past few months she has had right foot pain, but recently has gotten much more bothersome. She denies any recent trauma to the area or falls. She describes the pain as  dull, and only hurts when she walks. She denies any weakness in the foot. Of note, patient has residual left sided deficits after a CVA, and has been heavily favoring her right foot when walking, with a slight eversion. The pain is located on the lateral side of the right foot, and extends throughout the dorsal aspect of her foot. There is no appreciable erythema in the area, and is tender to palpation. On gait exam, pt walks with a heavy emphasis on ehr right foot with a slight eversion of her foot.   Her pain is likely due to overuse injury secondary to the fact that she is mostly weight bearing on the right side given hx of L sided weakness post-CVA. Stress fracture also on the differential list. Will treat with voltaren gel. Also placed referral to podiatry for consideration of orthotic to redistribute pressure points due to her her weakness on her left side.   Plan:  - Referral to podiatry  - Voltaren gel   GERD (gastroesophageal reflux disease) Pt with history of GERD well controlled with omeprazole, will refill.   Cervical radiculopathy Pt with history of right sided cervical radiculopathy for which she takes gabapentin for with relief. Will refill.   Patient discussed with Dr. Leonard Downing Cameran Pettey, M.D. [facility], PGY-1 Pager: [phone] Date 01/04/2023 Time 3:20 PM

## 2023-01-04 NOTE — Assessment & Plan Note (Signed)
Pt with history of right sided cervical radiculopathy for which she takes gabapentin for with relief. Will refill.

## 2023-01-05 ENCOUNTER — Other Ambulatory Visit (HOSPITAL_COMMUNITY): Payer: Self-pay

## 2023-01-08 ENCOUNTER — Other Ambulatory Visit: Payer: Self-pay

## 2023-01-08 ENCOUNTER — Other Ambulatory Visit (HOSPITAL_COMMUNITY): Payer: Self-pay

## 2023-01-08 DIAGNOSIS — I428 Other cardiomyopathies: Secondary | ICD-10-CM

## 2023-01-09 ENCOUNTER — Other Ambulatory Visit (HOSPITAL_COMMUNITY): Payer: Self-pay

## 2023-01-09 MED ORDER — CARVEDILOL 6.25 MG PO TABS
6.2500 mg | ORAL_TABLET | Freq: Two times a day (BID) | ORAL | 3 refills | Status: DC
  Filled 2023-01-09: qty 180, 90d supply, fill #0

## 2023-01-11 ENCOUNTER — Ambulatory Visit: Payer: Commercial Managed Care - HMO | Admitting: Podiatry

## 2023-01-16 ENCOUNTER — Encounter: Payer: Self-pay | Admitting: Podiatry

## 2023-01-16 ENCOUNTER — Ambulatory Visit (INDEPENDENT_AMBULATORY_CARE_PROVIDER_SITE_OTHER): Payer: Commercial Managed Care - HMO

## 2023-01-16 ENCOUNTER — Other Ambulatory Visit (HOSPITAL_COMMUNITY): Payer: Self-pay

## 2023-01-16 ENCOUNTER — Ambulatory Visit (INDEPENDENT_AMBULATORY_CARE_PROVIDER_SITE_OTHER): Payer: Commercial Managed Care - HMO | Admitting: Podiatry

## 2023-01-16 ENCOUNTER — Other Ambulatory Visit: Payer: Self-pay

## 2023-01-16 DIAGNOSIS — M2170 Unequal limb length (acquired), unspecified site: Secondary | ICD-10-CM

## 2023-01-16 DIAGNOSIS — B351 Tinea unguium: Secondary | ICD-10-CM

## 2023-01-16 DIAGNOSIS — M7751 Other enthesopathy of right foot: Secondary | ICD-10-CM | POA: Diagnosis not present

## 2023-01-16 DIAGNOSIS — M7671 Peroneal tendinitis, right leg: Secondary | ICD-10-CM | POA: Diagnosis not present

## 2023-01-16 DIAGNOSIS — M7750 Other enthesopathy of unspecified foot: Secondary | ICD-10-CM

## 2023-01-16 MED ORDER — TERBINAFINE HCL 250 MG PO TABS
250.0000 mg | ORAL_TABLET | Freq: Every day | ORAL | 0 refills | Status: DC
  Filled 2023-01-16: qty 90, 90d supply, fill #0

## 2023-01-16 NOTE — Patient Instructions (Signed)

## 2023-01-17 NOTE — Progress Notes (Signed)
  Subjective:  [name], female    DOB: [birth year],  MRN: [id number]  Chief Complaint  Patient presents with   Foot Pain    np right foot pain   Nail Problem    Thick painful toenails    62 y.o. female presents with the above complaint. History confirmed with patient.  Her nails are discolored thickened and elongated.  Has not had treatment for this but would like to be treated.  She notes pain on the outside edge of the right foot.  Objective:  Physical Exam: warm, good capillary refill, no trophic changes or ulcerative lesions, normal DP and PT pulses, normal sensory exam, onychomycosis, and pain over the peroneals and with resisted eversion, she has a limb length discrepancy with 1 inch shorter left leg than right leg.      Assessment:   1. Tendonitis of ankle or foot   2. Onychomycosis   3. Acquired unequal limb length      Plan:  Patient was evaluated and treated and all questions answered.  Onychomycosis -Educated on etiology of nail fungus. -Discussed oral topical and laser therapy and risks and benefits of each -eRx for oral terbinafine #90. Educated on risks and benefits of the medication. -Photographs taken -LFTs ordered  Discussed the etiology and treatment options for peroneal tendinitis including stretching, formal physical therapy with an eccentric exercises therapy plan, supportive shoegears such as a running shoe or sneaker, bracing, topical and oral medications.  We also discussed that I do not routinely perform injections in this area because of the risk of an increased damage or rupture of the tendon.  We also discussed the role of surgical treatment of this for patients who do not improve after exhausting non-surgical treatment options.  We also discussed the limb length discrepancy that she has, does not have any history of knee or hip surgery but it is quite profound clinically.  I dispensed her 3 heel lifts to trial.  May need an  orthosis with this integrated into it as well.  -XR reviewed with patient -Educated on stretching and icing of the affected limb. -Recommended she use Voltaren gel 3-4 times daily   Return in about 3 months (around 04/18/2023) for follow up after nail fungus treatment.

## 2023-01-18 LAB — HEPATIC FUNCTION PANEL
ALT: 11 IU/L (ref 0–32)
AST: 14 IU/L (ref 0–40)
Albumin: 4.4 g/dL (ref 3.8–4.9)
Alkaline Phosphatase: 99 IU/L (ref 44–121)
Bilirubin Total: 0.3 mg/dL (ref 0.0–1.2)
Bilirubin, Direct: <0.1 mg/dL (ref 0.00–0.40)
Total Protein: 7.2 g/dL (ref 6.0–8.5)

## 2023-01-20 NOTE — Progress Notes (Signed)
Internal Medicine Clinic Attending  I saw and evaluated the patient.  I personally confirmed the key portions of the history and exam documented by Dr. Nooruddin and I reviewed pertinent patient test results.  The assessment, diagnosis, and plan were formulated together and I agree with the documentation in the resident's note.  

## 2023-01-22 ENCOUNTER — Other Ambulatory Visit (HOSPITAL_COMMUNITY): Payer: Self-pay

## 2023-02-13 ENCOUNTER — Encounter: Payer: Self-pay | Admitting: Obstetrics and Gynecology

## 2023-02-13 ENCOUNTER — Ambulatory Visit (INDEPENDENT_AMBULATORY_CARE_PROVIDER_SITE_OTHER): Payer: Commercial Managed Care - HMO | Admitting: Obstetrics and Gynecology

## 2023-02-13 ENCOUNTER — Other Ambulatory Visit (HOSPITAL_COMMUNITY)
Admission: RE | Admit: 2023-02-13 | Discharge: 2023-02-13 | Disposition: A | Discharge status: Home / Self Care | Payer: Commercial Managed Care - HMO | Source: Ambulatory Visit | Attending: Obstetrics and Gynecology | Admitting: Obstetrics and Gynecology

## 2023-02-13 VITALS — BP 114/67 | HR 62 | Ht 64.0 in | Wt 140.0 lb

## 2023-02-13 DIAGNOSIS — F17209 Nicotine dependence, unspecified, with unspecified nicotine-induced disorders: Secondary | ICD-10-CM

## 2023-02-13 DIAGNOSIS — R87610 Atypical squamous cells of undetermined significance on cytologic smear of cervix (ASC-US): Secondary | ICD-10-CM | POA: Insufficient documentation

## 2023-02-13 DIAGNOSIS — Z1231 Encounter for screening mammogram for malignant neoplasm of breast: Secondary | ICD-10-CM | POA: Diagnosis not present

## 2023-02-13 DIAGNOSIS — Z1331 Encounter for screening for depression: Secondary | ICD-10-CM

## 2023-02-13 DIAGNOSIS — Z01419 Encounter for gynecological examination (general) (routine) without abnormal findings: Secondary | ICD-10-CM

## 2023-02-13 NOTE — Progress Notes (Addendum)
62 y.o. New GYN presents for COLPO, +High risk HPV and ASCUS on PAP.  PHQ-9=23 GAD-7=18

## 2023-02-13 NOTE — Progress Notes (Signed)
    GYNECOLOGY OFFICE COLPOSCOPY PROCEDURE NOTE  62 y.o. [id number] here for colposcopy for ASCUS with POSITIVE high risk HPV pap smear on 11/15/22.   Pregnancy test:  not indicated Gardasil:  N/A Tobacco use: 1-2cpd. Discussed that smoking cessation can help improve body's ability to clear HPV  Informed consent and review of risks, benefit and alternatives performed. Written consent given.   Speculum inserted into patient's vagina assuring full view of cervix and vaginal walls. Vinegar solution applied to the cervix and vaginal walls and colposcope was used to observe both the cervix and vaginal walls.   Colposcopy adequate? Yes Thin AWE at 12 o'clock with smooth borders, no mosicism or puncations predict CINI-II Thick AWE at 8 o'clock with mosaicism and smooth borders, predict CINII-III  Corresponding biopsies obtained. ECC collected.  Cervical stenosis noted - would not do LEEP in [facility] office as those loops would not allow for an adequate specimen.   All specimens were labeled and sent to pathology.  Silver nitrate applied to biopsy sites for good hemostasis and speculum removed.  Pt tolerated well with minimal pain and bleeding.   Patient was given post procedure instructions.  Will follow up pathology and manage accordingly; patient will be contacted with results and recommendations.  Routine preventative health maintenance measures emphasized.  Harvie Bridge, MD Obstetrician & Gynecologist, [facility] for [employer], [facility]

## 2023-02-13 NOTE — Patient Instructions (Signed)
It is normal to have cramping and bleeding for the next 2-3 days. You should feel better every day.  Please call us if you have any severe pain, bleeding that soaks more than 1 pad in a hour, have fevers, or feel like you're going to pass out.  You can take tylenol 1000mg every 8 hours and ibuprofen 600mg every 8 hours as needed for pain. It is ok to take both at the same time.   

## 2023-02-13 NOTE — Progress Notes (Signed)
NEW GYNECOLOGY VISIT  Subjective:  [name] is a 62 y.o. menopausal [phone] presenting to establish care for abnormal pap smear  [name] has no complaints today. Depression & anxiety screening were performed and both positive. Denies SI.  PapHx - I personally reviewed these results 11/26/21 NILM 07/26/16 NILM 09/02/19 NILM/HPV+ 11724 ASCUS/HPV+, negative HPV 16/18/45  Past Medical History:  Diagnosis Date   Alcoholism    ANEMIA, VITAMIN B12 DEFICIENCY 11/11/2007   Documented low 175 on 09/2007; received 6 yrs of IM therapy; switched to po 05/2013    Anxiety    Breast pain, left 06/02/2021   Cardiac arrest 01/16/2013   witnessed, shockable rhythm, initally low EF, no CAD at cath. Drug screen negative.   Chest pain    a. 04/2009 Echo: EF 55-60%, Gr1 DD, Mild MR.   Depression    Duodenitis    secondary to H pylori(treatment completed 05/2006), +/- NSAIDS   Duodenitis    a. 05/2006 2/2 H pylori +/- NSAIDS   DVT (deep venous thrombosis)    a. 04/2009 - treated with coumadin x 8 mos.   ETOH abuse    Heart murmur    History of cocaine abuse    History of CVA (cerebrovascular accident)    Hypertension    Left arm pain 06/11/2017   PE 05/16/2009   CT angio positive for PE in 07/10, treated with coumadin for 8 months.    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 04/2009 - treated with coumadin x 8 mos.   Rectal bleeding 2007   internal hemmorhoids by colonoscopy in 04/2006, Bx neg for IBD   Rectal bleeding    a. int hemorrhoids by colonoscopy in 04/2006, Bx neg for IBD   Right foot pain 05/05/2020   Seizures    Shortness of breath 05/28/2020   Stroke    Tobacco abuse    Trochanteric bursitis of left hip 05/22/2016   Past Surgical History:  Procedure Laterality Date   CESAREAN SECTION     LEFT HEART CATHETERIZATION WITH CORONARY ANGIOGRAM N/A 06/23/2014   Procedure: LEFT HEART CATHETERIZATION WITH CORONARY ANGIOGRAM;  Surgeon: Laurey Morale, MD; No CAD   Current  Outpatient Medications on File Prior to Visit  Medication Sig Dispense Refill   Acetaminophen Extra Strength 500 MG CAPS Take 1 capsule by mouth 3 (three) times daily.     albuterol (VENTOLIN HFA) 108 (90 Base) MCG/ACT inhaler Inhale 2 puffs into the lungs every 4 (four) hours as needed for wheezing or shortness of breath. 6.7 g 0   aspirin EC 81 MG tablet Take 81 mg by mouth daily. Swallow whole.     atorvastatin (LIPITOR) 40 MG tablet Take 1 tablet (40 mg total) by mouth daily. 90 tablet 3   carvedilol (COREG) 6.25 MG tablet Take 1 tablet (6.25 mg total) by mouth 2 (two) times daily. 180 tablet 3   Cholecalciferol 1000 units capsule Take 1 capsule (1,000 Units total) by mouth daily. 90 capsule 3   diclofenac Sodium (VOLTAREN) 1 % GEL Apply 2 g topically 4 (four) times daily. 100 g 0   gabapentin (NEURONTIN) 300 MG capsule Take 1 capsule (300 mg total) by mouth 3 (three) times daily. 270 capsule 3   LASIX 20 MG tablet Take 20 mg by mouth daily.     nitroGLYCERIN (NITROSTAT) 0.4 MG SL tablet Place 1 tablet (0.4 mg total) under the tongue every 5 (five) minutes as needed for chest pain.  25 tablet 1   omeprazole (PRILOSEC) 40 MG capsule Take 1 capsule (40 mg total) by mouth daily for 14 days. 14 capsule 0   terbinafine (LAMISIL) 250 MG tablet Take 1 tablet (250 mg total) by mouth daily. 90 tablet 0   traZODone (DESYREL) 50 MG tablet Take 1 tablet (50 mg total) by mouth at bedtime as needed for sleep. 30 tablet 0   vitamin B-12 (CYANOCOBALAMIN) 1000 MCG tablet Take 1 tablet (1,000 mcg total) by mouth daily. 30 tablet 2   No current facility-administered medications on file prior to visit.   Allergies  Allergen Reactions   Iodinated Contrast Media Other (See Comments)    Seizure   Omnipaque [Iohexol] Other (See Comments)    Seizure    OB History     Gravida  3   Para  1   Term      Preterm  1   AB  1   Living  2      SAB      IAB      Ectopic      Multiple      Live  Births  1          Social History   Socioeconomic History   Marital status: Single    Spouse name: Not on file   Number of children: 2   Years of education: Not on file   Highest education level: 11th grade  Occupational History   Occupation: Unemployed  Tobacco Use   Smoking status: Some Days    Packs/day: .1    Types: Cigarettes   Smokeless tobacco: Never   Tobacco comments:    smokes about 1-2 cigs/day  Vaping Use   Vaping Use: Never used  Substance and Sexual Activity   Alcohol use: Yes    Comment: beer   Drug use: Yes    Types: Marijuana, Cocaine   Sexual activity: Yes    Partners: Male    Birth control/protection: Post-menopausal  Other Topics Concern   Not on file  Social History Narrative   ** Merged History Encounter **       ** Data from: 12/05/12 Enc [facility]   No cocaine or alcohol use since hospital discharge 05/19/2009.      10/13 update:  States she is smoking 1-2 cigarettes daily with at least 1 drink daily usually Brandy.                  ** Data from: 01/17/13 Enc Dept: [facility]   Pt lives in [place] with a roommate.      Social Determinants of Health   Financial Resource Strain: Not on file  Food Insecurity: No Food Insecurity (08/02/2021)   Hunger Vital Sign    Worried About Running Out of Food in the Last Year: Never true    Ran Out of Food in the Last Year: Never true  Transportation Needs: Unmet Transportation Needs (08/02/2021)   PRAPARE - Administrator, Civil Service (Medical): Yes    Lack of Transportation (Non-Medical): Yes  Physical Activity: Not on file  Stress: Not on file  Social Connections: Not on file  Intimate Partner Violence: Not on file   Objective:   Vitals:   02/13/23 0840  BP: 114/67  Pulse: 62  Weight: 140 lb (63.5 kg)  Height:  (1.626 m)    General:  Alert, oriented and cooperative. Patient is in no acute distress.  Skin: Skin is warm and dry. No rash noted.    Cardiovascular: Normal heart rate noted  Respiratory: Normal respiratory effort, no problems with respiration noted  Abdomen: Soft, non-tender, non-distended   Pelvic: NEFG.   Exam performed in the presence of a chaperone  Assessment and Plan:  [name] is a 62 y.o. with ASCUS/HPV+ pap, positive depression/anxiety screening, tobacco use disorder, and need for breast cancer screening  Atypical squamous cells of undetermined significance (ASCUS) on Papanicolaou smear of cervix Colposcopy completed today. Biopsies & ECC performed, see separate procedure note Predict CINII-III Follow up pending path result. If CIN II-III plan OR LEEP -     Surgical pathology( [place]/ [facility]) -     Surgical pathology( [place]/ [facility]) -     Surgical pathology( / [facility])  Encounter for screening mammogram for malignant neoplasm of breast -     MM 3D SCREENING MAMMOGRAM BILATERAL BREAST; Future  Positive depression screening -     Ambulatory referral to Integrated Behavioral Health  Tobacco use disorder, continuous Discussed cessation, reviewed that it can help her clear HPV She reports that plans to quit  Return for pending biopsy results.  Future Appointments  Date Time Provider Department Center  02/28/2023 10:00 AM Gwyndolyn Saxon, LCSW [facility] None  03/21/2023  7:20 AM [facility] MM 2 [facility]  04/16/2023 10:45 AM Louann Sjogren, DPM [facility]    Lennart Pall, MD

## 2023-02-14 LAB — SURGICAL PATHOLOGY

## 2023-02-22 ENCOUNTER — Telehealth: Payer: Self-pay

## 2023-02-22 NOTE — Telephone Encounter (Signed)
Attempted to contact about biopsy results, no answer, left vm

## 2023-02-28 ENCOUNTER — Ambulatory Visit (INDEPENDENT_AMBULATORY_CARE_PROVIDER_SITE_OTHER): Payer: Commercial Managed Care - HMO | Admitting: Licensed Clinical Social Worker

## 2023-02-28 ENCOUNTER — Telehealth: Payer: Self-pay

## 2023-02-28 DIAGNOSIS — F1011 Alcohol abuse, in remission: Secondary | ICD-10-CM

## 2023-02-28 DIAGNOSIS — F32A Depression, unspecified: Secondary | ICD-10-CM | POA: Diagnosis not present

## 2023-02-28 NOTE — Telephone Encounter (Signed)
Called patient to discuss bx results. Patient aware of results and the need to repeat pap in 1 year.

## 2023-03-02 NOTE — BH Specialist Note (Incomplete)
Integrated Behavioral Health Initial In-Person Visit  MRN: [id number] Name: [name]  Number of Integrated Behavioral Health Clinician visits 1 Session Start time: 947am   Session End time: 1018am Total time in minutes:   Types of Service: {CHL AMB TYPE OF [phone]}  Interpretor:{yes [id number]} Interpretor Name and Language: ***   Warm Hand Off Completed.        Subjective: [name] is a 62 y.o. female accompanied by {CHL AMB ACCOMPANIED YN:[id number]} Patient was referred by *** for ***. Patient reports the following symptoms/concerns: *** Duration of problem: ***; Severity of problem: {Mild/Moderate/Severe:20260}  Objective: Mood: {BHH MOOD:22306} and Affect: {BHH AFFECT:22307} Risk of harm to self or others: {CHL AMB BH Suicide Current Mental Status:21022748}  Life Context: Family and Social: *** School/Work: *** Self-Care: *** Life Changes: ***  Patient and/or Family's Strengths/Protective Factors: {CHL AMB BH PROTECTIVE FACTORS:[phone]}  Goals Addressed: Patient will: Reduce symptoms of: {IBH Symptoms:21014056} Increase knowledge and/or ability of: {IBH Patient Tools:21014057}  Demonstrate ability to: {IBH Goals:21014053}  Progress towards Goals: {CHL AMB BH PROGRESS TOWARDS GOALS:[phone]}  Interventions: Interventions utilized: {IBH Interventions:21014054}  Standardized Assessments completed: {IBH Screening Tools:21014051}  Patient and/or Family Response: ***  Patient Centered Plan: Patient is on the following Treatment Plan(s):  ***  Assessment: Patient currently experiencing ***.   Patient may benefit from ***.  Plan: Follow up with behavioral health clinician on : *** Behavioral recommendations: *** Referral(s): {IBH Referrals:21014055} "From scale of 1-10, how likely are you to follow plan?": ***  Gwyndolyn Saxon, LCSW

## 2023-03-02 NOTE — BH Specialist Note (Signed)
Integrated Behavioral Health Initial In-Person Visit  MRN: [id number] Name: [name]  Number of Integrated Behavioral Health Clinician visits: No data recorded Session Start time: No data recorded   Session End time: No data recorded Total time in minutes: No data recorded  Types of Service: {CHL AMB TYPE OF [phone]}  Interpretor:{yes [id number]} Interpretor Name and Language: ***   Warm Hand Off Completed.        Subjective: [name] is a 62 y.o. female accompanied by {CHL AMB ACCOMPANIED YN:[id number]} Patient was referred by *** for ***. Patient reports the following symptoms/concerns: *** Duration of problem: ***; Severity of problem: {Mild/Moderate/Severe:20260}  Objective: Mood: {BHH MOOD:22306} and Affect: {BHH AFFECT:22307} Risk of harm to self or others: {CHL AMB BH Suicide Current Mental Status:21022748}  Life Context: Family and Social: *** School/Work: *** Self-Care: *** Life Changes: ***  Patient and/or Family's Strengths/Protective Factors: {CHL AMB BH PROTECTIVE FACTORS:[phone]}  Goals Addressed: Patient will: Reduce symptoms of: {IBH Symptoms:21014056} Increase knowledge and/or ability of: {IBH Patient Tools:21014057}  Demonstrate ability to: {IBH Goals:21014053}  Progress towards Goals: {CHL AMB BH PROGRESS TOWARDS [phone]}  Interventions: Interventions utilized: {IBH Interventions:21014054}  Standardized Assessments completed: {IBH Screening Tools:21014051}  Patient and/or Family Response: ***  Patient Centered Plan: Patient is on the following Treatment Plan(s):  ***  Assessment: Patient currently experiencing ***.   Patient may benefit from ***.  Plan: Follow up with behavioral health clinician on : *** Behavioral recommendations: *** Referral(s): {IBH Referrals:21014055} "From scale of 1-10, how likely are you to follow plan?": ***  Amber Saxon, LCSW

## 2023-03-06 ENCOUNTER — Other Ambulatory Visit: Payer: Self-pay

## 2023-03-06 ENCOUNTER — Emergency Department (HOSPITAL_COMMUNITY): Payer: Commercial Managed Care - HMO

## 2023-03-06 ENCOUNTER — Emergency Department (HOSPITAL_COMMUNITY)
Admission: EM | Admit: 2023-03-06 | Discharge: 2023-03-07 | Disposition: A | Discharge status: Home / Self Care | Payer: Commercial Managed Care - HMO | Attending: Emergency Medicine | Admitting: Emergency Medicine

## 2023-03-06 DIAGNOSIS — Z7982 Long term (current) use of aspirin: Secondary | ICD-10-CM | POA: Diagnosis not present

## 2023-03-06 DIAGNOSIS — J40 Bronchitis, not specified as acute or chronic: Secondary | ICD-10-CM | POA: Insufficient documentation

## 2023-03-06 DIAGNOSIS — R0602 Shortness of breath: Secondary | ICD-10-CM | POA: Diagnosis present

## 2023-03-06 MED ORDER — IPRATROPIUM-ALBUTEROL 0.5-2.5 (3) MG/3ML IN SOLN
3.0000 mL | Freq: Once | RESPIRATORY_TRACT | Status: AC
  Administered 2023-03-07: 3 mL via RESPIRATORY_TRACT
  Filled 2023-03-06: qty 3

## 2023-03-06 NOTE — ED Provider Notes (Signed)
[facility] AT [facility] Provider Note   CSN: [id number] Arrival date & time: 03/06/23  2323     History {Add pertinent medical, surgical, social history, OB history to HPI:1} No chief complaint on file.   EVELY BULICK is a 62 y.o. female.  62 year old female who presents the ER today secondary to shortness of breath and cough.  Patient states that she is felt unwell since probably Friday with some coughing.  She thought it might be related to the weather change and rain.  This progressed throughout the weekend and she started become more dyspneic then tonight she was dyspneic even when she was going up the steps.  She also gets out of breath when she lays a certain way to bed.  States he has a history of heart failure but it has resolved and she is not currently on any diuretics.  She has a long history of smoking.  She has a previous history of alcohol use.  Most recently she smokes marijuana and drank some alcohol on Saturday night.  Symptoms seem to proceed with that.  She states that her breathing and coughing have caused her to have some upper abdominal soreness but no real pain.  She also states she has not been to smoke as much as she wanted to the last couple days because of the breathing.        Home Medications Prior to Admission medications   Medication Sig Start Date End Date Taking? Authorizing Provider  Acetaminophen Extra Strength 500 MG CAPS Take 1 capsule by mouth 3 (three) times daily. 06/19/22   [provider]  albuterol (VENTOLIN HFA) 108 (90 Base) MCG/ACT inhaler Inhale 2 puffs into the lungs every 4 (four) hours as needed for wheezing or shortness of breath. 01/03/23   Nooruddin,  Fila, MD  aspirin EC 81 MG tablet Take 81 mg by mouth daily. Swallow whole.    [provider]  atorvastatin (LIPITOR) 40 MG tablet Take 1 tablet (40 mg total) by mouth daily. 03/20/22   Ellison Carwin, MD  carvedilol (COREG) 6.25 MG tablet  Take 1 tablet (6.25 mg total) by mouth 2 (two) times daily. 01/09/23   Crissie Sickles, MD  Cholecalciferol 1000 units capsule Take 1 capsule (1,000 Units total) by mouth daily. 07/04/17   Santos-Sanchez, Chelsea Primus, MD  diclofenac Sodium (VOLTAREN) 1 % GEL Apply 2 g topically 4 (four) times daily. 01/03/23   Nooruddin,  Fila, MD  gabapentin (NEURONTIN) 300 MG capsule Take 1 capsule (300 mg total) by mouth 3 (three) times daily. 01/03/23   Nooruddin,  Fila, MD  LASIX 20 MG tablet Take 20 mg by mouth daily. 05/22/22   [provider]  nitroGLYCERIN (NITROSTAT) 0.4 MG SL tablet Place 1 tablet (0.4 mg total) under the tongue every 5 (five) minutes as needed for chest pain. 03/20/22   Ellison Carwin, MD  omeprazole (PRILOSEC) 40 MG capsule Take 1 capsule (40 mg total) by mouth daily for 14 days. 01/03/23 01/22/23  Nooruddin,  Fila, MD  terbinafine (LAMISIL) 250 MG tablet Take 1 tablet (250 mg total) by mouth daily. 01/16/23   McDonald, Rachelle Hora, DPM  traZODone (DESYREL) 50 MG tablet Take 1 tablet (50 mg total) by mouth at bedtime as needed for sleep. 03/30/22   Lenard Lance, FNP  vitamin B-12 (CYANOCOBALAMIN) 1000 MCG tablet Take 1 tablet (1,000 mcg total) by mouth daily. 07/26/16   Althia Forts, MD      Allergies    Iodinated contrast  media and Omnipaque [iohexol]    Review of Systems   Review of Systems  Physical Exam Updated Vital Signs BP (!) 142/89   Pulse 65   Temp 98.2 F (36.8 C) (Oral)   Resp 20   LMP 11/02/2010   SpO2 100%  Physical Exam Vitals and nursing note reviewed.  Constitutional:      Appearance: She is well-developed.  HENT:     Head: Normocephalic and atraumatic.     Mouth/Throat:     Mouth: Mucous membranes are moist.  Eyes:     Pupils: Pupils are equal, round, and reactive to light.  Cardiovascular:     Rate and Rhythm: Normal rate and regular rhythm.  Pulmonary:     Effort: No respiratory distress.     Breath sounds: No stridor. Wheezing (diffuse and intermittent) and  rales (RLL) present.  Abdominal:     General: Abdomen is flat. There is no distension.  Musculoskeletal:     Cervical back: Normal range of motion.  Skin:    General: Skin is warm and dry.  Neurological:     General: No focal deficit present.     Mental Status: She is alert.     ED Results / Procedures / Treatments   Labs (all labs ordered are listed, but only abnormal results are displayed) Labs Reviewed  CBC WITH DIFFERENTIAL/PLATELET  COMPREHENSIVE METABOLIC PANEL  LIPASE, BLOOD  BRAIN NATRIURETIC PEPTIDE  TROPONIN I (HIGH SENSITIVITY)    EKG None  Radiology No results found.  Procedures Procedures  {Document cardiac monitor, telemetry assessment procedure when appropriate:1}  Medications Ordered in ED Medications  ipratropium-albuterol (DUONEB) 0.5-2.5 (3) MG/3ML nebulizer solution 3 mL (has no administration in time range)    ED Course/ Medical Decision Making/ A&P   {   Click here for ABCD2, HEART and other calculatorsREFRESH Note before signing :1}                          Medical Decision Making Amount and/or Complexity of Data Reviewed Labs: ordered. Radiology: ordered.  Risk Prescription drug management.  Will evaluate for pneumonia versus heart failure versus COPD exacerbation.  Will try breathing treatments and she does have some scattered wheezing although overall her lungs are clear aside from her right lower lobe. ***  {Document critical care time when appropriate:1} {Document review of labs and clinical decision tools ie heart score, Chads2Vasc2 etc:1}  {Document your independent review of radiology images, and any outside records:1} {Document your discussion with family members, caretakers, and with consultants:1} {Document social determinants of health affecting pt's care:1} {Document your decision making why or why not admission, treatments were needed:1} Final Clinical Impression(s) / ED Diagnoses Final diagnoses:  None    Rx / DC  Orders ED Discharge Orders     None

## 2023-03-07 ENCOUNTER — Other Ambulatory Visit: Payer: Self-pay

## 2023-03-07 ENCOUNTER — Other Ambulatory Visit (HOSPITAL_COMMUNITY): Payer: Self-pay

## 2023-03-07 DIAGNOSIS — J40 Bronchitis, not specified as acute or chronic: Secondary | ICD-10-CM | POA: Diagnosis not present

## 2023-03-07 LAB — COMPREHENSIVE METABOLIC PANEL
ALT: 18 U/L (ref 0–44)
AST: 19 U/L (ref 15–41)
Albumin: 3.5 g/dL (ref 3.5–5.0)
Alkaline Phosphatase: 69 U/L (ref 38–126)
Anion gap: 9 (ref 5–15)
BUN: 11 mg/dL (ref 6–20)
CO2: 22 mmol/L (ref 22–32)
Calcium: 8.4 mg/dL — ABNORMAL LOW (ref 8.9–10.3)
Chloride: 105 mmol/L (ref 98–111)
Creatinine, Ser: 0.76 mg/dL (ref 0.44–1.00)
GFR, Estimated: >60 mL/min (ref >60)
Glucose, Bld: 105 mg/dL — ABNORMAL HIGH (ref 70–99)
Potassium: 3.8 mmol/L (ref 3.5–5.1)
Sodium: 136 mmol/L (ref 135–145)
Total Bilirubin: 0.1 mg/dL — ABNORMAL LOW (ref 0.3–1.2)
Total Protein: 6.6 g/dL (ref 6.5–8.1)

## 2023-03-07 LAB — CBC WITH DIFFERENTIAL/PLATELET
Abs Immature Granulocytes: 0.03 10*3/uL (ref 0.00–0.07)
Basophils Absolute: 0 10*3/uL (ref 0.0–0.1)
Basophils Relative: 0 %
Eosinophils Absolute: 0.4 10*3/uL (ref 0.0–0.5)
Eosinophils Relative: 5 %
HCT: 34.3 % — ABNORMAL LOW (ref 36.0–46.0)
Hemoglobin: 11 g/dL — ABNORMAL LOW (ref 12.0–15.0)
Immature Granulocytes: 0 %
Lymphocytes Relative: 33 %
Lymphs Abs: 2.5 10*3/uL (ref 0.7–4.0)
MCH: 28.4 pg (ref 26.0–34.0)
MCHC: 32.1 g/dL (ref 30.0–36.0)
MCV: 88.4 fL (ref 80.0–100.0)
Monocytes Absolute: 0.9 10*3/uL (ref 0.1–1.0)
Monocytes Relative: 12 %
Neutro Abs: 3.8 10*3/uL (ref 1.7–7.7)
Neutrophils Relative %: 50 %
Platelets: 239 10*3/uL (ref 150–400)
RBC: 3.88 MIL/uL (ref 3.87–5.11)
RDW: 13.9 % (ref 11.5–15.5)
WBC: 7.6 10*3/uL (ref 4.0–10.5)
nRBC: 0 % (ref 0.0–0.2)

## 2023-03-07 LAB — TROPONIN I (HIGH SENSITIVITY): Troponin I (High Sensitivity): 14 ng/L (ref <18)

## 2023-03-07 LAB — LIPASE, BLOOD: Lipase: 37 U/L (ref 11–51)

## 2023-03-07 LAB — BRAIN NATRIURETIC PEPTIDE: B Natriuretic Peptide: 47.5 pg/mL (ref 0.0–100.0)

## 2023-03-07 MED ORDER — DOXYCYCLINE HYCLATE 100 MG PO TABS
100.0000 mg | ORAL_TABLET | Freq: Once | ORAL | Status: AC
  Administered 2023-03-07: 100 mg via ORAL
  Filled 2023-03-07: qty 1

## 2023-03-07 MED ORDER — PREDNISONE 20 MG PO TABS
60.0000 mg | ORAL_TABLET | Freq: Once | ORAL | Status: AC
  Administered 2023-03-07: 60 mg via ORAL
  Filled 2023-03-07: qty 3

## 2023-03-07 MED ORDER — BENZONATATE 100 MG PO CAPS
100.0000 mg | ORAL_CAPSULE | Freq: Three times a day (TID) | ORAL | 0 refills | Status: DC | PRN
  Filled 2023-03-07: qty 21, 7d supply, fill #0

## 2023-03-07 MED ORDER — PREDNISONE 10 MG PO TABS
20.0000 mg | ORAL_TABLET | Freq: Every day | ORAL | 0 refills | Status: DC
  Filled 2023-03-07: qty 15, 7d supply, fill #0

## 2023-03-07 MED ORDER — DOXYCYCLINE HYCLATE 100 MG PO CAPS
100.0000 mg | ORAL_CAPSULE | Freq: Two times a day (BID) | ORAL | 0 refills | Status: AC
  Filled 2023-03-07: qty 10, 5d supply, fill #0

## 2023-03-07 MED ORDER — BENZONATATE 100 MG PO CAPS
100.0000 mg | ORAL_CAPSULE | Freq: Once | ORAL | Status: AC
  Administered 2023-03-07: 100 mg via ORAL
  Filled 2023-03-07: qty 1

## 2023-03-07 NOTE — ED Triage Notes (Addendum)
Chief Complaint  Patient presents with   Shortness of Breath   BP (!) 142/89   Pulse 65   Temp 98.2 F (36.8 C) (Oral)   Resp 20   LMP 11/02/2010   SpO2 100%   Pt presents to ED 36 via EMS from home with above complaint.  Pt endorses SOB/cough for last few days.  Pt also endorses new onset abdominal swelling for about 6 hours.  Pt reports she used to drink heavily; states she has stopped drinking as much.  States last drink was Saturday and says she had 3 cans of beer and 1 mini-liquor bottle.

## 2023-03-07 NOTE — Discharge Instructions (Addendum)
Please take 3-4 puffs of your inhaler 3-4 times a day for the next three days and as needed for shortness of breath and cough. If not improving, follow up with your PCP. If anything worsens return here for recheck.

## 2023-03-07 NOTE — ED Notes (Signed)
Pt able to ambulate to restroom with steady gait.

## 2023-03-07 NOTE — ED Notes (Signed)
Pt states her breathing feels a little better after breathing treatment.

## 2023-03-14 ENCOUNTER — Ambulatory Visit (INDEPENDENT_AMBULATORY_CARE_PROVIDER_SITE_OTHER): Payer: Commercial Managed Care - HMO

## 2023-03-14 ENCOUNTER — Other Ambulatory Visit: Payer: Self-pay

## 2023-03-14 ENCOUNTER — Other Ambulatory Visit (HOSPITAL_COMMUNITY): Payer: Self-pay

## 2023-03-14 VITALS — BP 111/59 | HR 61 | Temp 97.8°F | Ht 64.0 in | Wt 138.5 lb

## 2023-03-14 DIAGNOSIS — R051 Acute cough: Secondary | ICD-10-CM | POA: Diagnosis not present

## 2023-03-14 DIAGNOSIS — I5020 Unspecified systolic (congestive) heart failure: Secondary | ICD-10-CM

## 2023-03-14 DIAGNOSIS — I428 Other cardiomyopathies: Secondary | ICD-10-CM | POA: Diagnosis not present

## 2023-03-14 DIAGNOSIS — R0609 Other forms of dyspnea: Secondary | ICD-10-CM

## 2023-03-14 DIAGNOSIS — Z789 Other specified health status: Secondary | ICD-10-CM

## 2023-03-14 DIAGNOSIS — Z0000 Encounter for general adult medical examination without abnormal findings: Secondary | ICD-10-CM

## 2023-03-14 MED ORDER — CARVEDILOL 6.25 MG PO TABS
6.2500 mg | ORAL_TABLET | Freq: Two times a day (BID) | ORAL | 3 refills | Status: DC
Start: 2023-03-14 — End: 2023-08-08
  Filled 2023-03-14: qty 180, 90d supply, fill #0

## 2023-03-14 NOTE — Assessment & Plan Note (Signed)
Echo from 08/2015 demonstrates EF 45-50% w/ G1DD. Current medications include carvedilol 6.25 BID and Lasix 20 MG daily. Patient states that they are compliant with the carvedilol but was told that [name] did not need lasix. Patient reports exertional dyspnea and orthopnea but denies LE swelling. Patient denies lightheadedness or dizziness. Patient reports that [name] has had some decreased appetite recently due to alcohol intake for her birthday and mothers day. Initial BP today is 97/50. Repeat BP is 111/59. Patient reports that [name] was previously seen by cardiology but has not been in a few years. No crackles on lung exam or lower extremity swelling to suggest acute CHF exacerbation. However, will obtain echo to assess for changes in her cardiac function given alcohol use and symptoms.    Plan: - Continue carvedilol 6.25 BID  - Stop Lasix 20 MG daily - Echo ordered

## 2023-03-14 NOTE — Assessment & Plan Note (Signed)
Patient was seen in the ED on 5/7 after presenting with cough and SOB. Patient was diagnosed with bronchitis and discharged with prescription for prednisone 10 mg daily, doxycycline 100 mg BID, and tessalon 100 mg TID PRN with plan to f/u w/ [facility]. Patient has not taken these medications yet as she wanted to drink alcohol on her birthday. She plans on starting these medications today. Patient is still experiencing SOB but her cough has improved. She reports that her albuterol inhaler helps with her SOB. She recently stopped smoking cigarettes. No prior history of asthma or COPD noted in her chart and no prior PFTs. Will obtain PFTs to assess for asthma vs COPD.   Plan: - Continue albuterol inhaler - Referral for PFTs

## 2023-03-14 NOTE — Patient Instructions (Addendum)
Thank you for coming to see [place] in clinic [name].  Plan:  - Please stop taking:     - Lasix 20 mg daily  - Please continue taking:     - Albuterol inhaler  - Carvedilol 6.25 mg twice daily  - We referred you to see the following doctors (you will be called to schedule an appointment):     - Gastroenterology (stomach doctor) for a colonoscopy  - We ordered an echocardiogram to assess your heart function (you will be called to schedule an appointment to have this done).   Return Precautions: If you develop lightheadedness, dizziness, blurry vision, worsening shortness of breath, lower extremity swelling, please call our clinic and visit the emergency department as these can be signs of low blood pressure or worsening heart function.    It was very nice to see you, thank you for allowing [place] to be involved in your care. We look forward to seeing you next time. Please call our clinic at [phone] if you have any questions or concerns. The best time to call is Monday-Friday from 9am-4pm, but there is someone available 24/7. If after hours or the weekend, call the main hospital number at [phone] and ask for the Internal Medicine Resident On-Call. If you need medication refills, please notify your pharmacy one week in advance and they will send [place] a request.   Please make sure to arrive 15 minutes prior to your next appointment. If you arrive late, you may be asked to reschedule.

## 2023-03-14 NOTE — Assessment & Plan Note (Signed)
Patient has long-standing history of alcohol use. She is interested in discontinuing alcohol at this time. She would not like pharmacotherapy to help with her drinking at this time.   Plan: - Continue to reassess her willingness to try pharmacotherapy if she has difficultly discontinuing drinking

## 2023-03-14 NOTE — Progress Notes (Signed)
CC: ED f/u visit  HPI:  [name] is a 62 y.o. female with past medical history of HFrEF, CVA (2003), GERD, cervical radiculopathy, depression, polysubstance abuse, alcohol abuse that presents for ED f/u visit.    Allergies as of 03/14/2023       Reactions   Iodinated Contrast Media Other (See Comments)   Seizure   Omnipaque [iohexol] Other (See Comments)   Seizure         Medication List        Accurate as of Mar 14, 2023 12:10 PM. If you have any questions, ask your nurse or doctor.          STOP taking these medications    Lasix 20 MG tablet Generic drug: furosemide Stopped by: Karoline Caldwell, MD       TAKE these medications    Acetaminophen Extra Strength 500 MG Caps Take 1 capsule by mouth 3 (three) times daily.   albuterol 108 (90 Base) MCG/ACT inhaler Commonly known as: VENTOLIN HFA Inhale 2 puffs into the lungs every 4 (four) hours as needed for wheezing or shortness of breath.   aspirin EC 81 MG tablet Take 81 mg by mouth daily. Swallow whole.   atorvastatin 40 MG tablet Commonly known as: LIPITOR Take 1 tablet (40 mg total) by mouth daily.   benzonatate 100 MG capsule Commonly known as: TESSALON Take 1 capsule (100 mg total) by mouth 3 (three) times daily as needed for cough.   carvedilol 6.25 MG tablet Commonly known as: COREG Take 1 tablet (6.25 mg total) by mouth 2 (two) times daily.   Cholecalciferol 25 MCG (1000 UT) capsule Take 1 capsule (1,000 Units total) by mouth daily.   cyanocobalamin 1000 MCG tablet Commonly known as: VITAMIN B12 Take 1 tablet (1,000 mcg total) by mouth daily.   diclofenac Sodium 1 % Gel Commonly known as: VOLTAREN Apply 2 g topically 4 (four) times daily.   gabapentin 300 MG capsule Commonly known as: NEURONTIN Take 1 capsule (300 mg total) by mouth 3 (three) times daily.   nitroGLYCERIN 0.4 MG SL tablet Commonly known as: NITROSTAT Place 1 tablet (0.4 mg total) under the tongue every 5  (five) minutes as needed for chest pain.   omeprazole 40 MG capsule Commonly known as: PRILOSEC Take 1 capsule (40 mg total) by mouth daily for 14 days.   predniSONE 10 MG tablet Commonly known as: DELTASONE Take 2 tablets (20 mg total) by mouth daily.   terbinafine 250 MG tablet Commonly known as: LAMISIL Take 1 tablet (250 mg total) by mouth daily.   traZODone 50 MG tablet Commonly known as: DESYREL Take 1 tablet (50 mg total) by mouth at bedtime as needed for sleep.         Past Medical History:  Diagnosis Date   Alcoholism (HCC)    ANEMIA, VITAMIN B12 DEFICIENCY 11/11/2007   Documented low 175 on 09/2007; received 6 yrs of IM therapy; switched to po 05/2013    Anxiety    Breast pain, left 06/02/2021   Cardiac arrest (HCC) 01/16/2013   witnessed, shockable rhythm, initally low EF, no CAD at cath. Drug screen negative.   Chest pain    a. 04/2009 Echo: EF 55-60%, Gr1 DD, Mild MR.   Depression    Duodenitis    secondary to H pylori(treatment completed 05/2006), +/- NSAIDS   Duodenitis    a. 05/2006 2/2 H pylori +/- NSAIDS   DVT (deep venous thrombosis) (HCC)  a. 04/2009 - treated with coumadin x 8 mos.   ETOH abuse    Heart murmur    History of cocaine abuse (HCC)    History of CVA (cerebrovascular accident)    Hypertension    Left arm pain 06/11/2017   PE 05/16/2009   CT angio positive for PE in 07/10, treated with coumadin for 8 months.    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 04/2009 - treated with coumadin x 8 mos.   Rectal bleeding 2007   internal hemmorhoids by colonoscopy in 04/2006, Bx neg for IBD   Rectal bleeding    a. int hemorrhoids by colonoscopy in 04/2006, Bx neg for IBD   Right foot pain 05/05/2020   Seizures (HCC)    Shortness of breath 05/28/2020   Stroke (HCC)    Tobacco abuse    Trochanteric bursitis of left hip 05/22/2016   Review of Systems:  per HPI.   Physical Exam: Vitals:   03/14/23 0923 03/14/23 0930 03/14/23 0948  BP: (!)  97/50 (!) 95/55 (!) 111/59  Pulse: 67 67 61  Temp: 97.8 F (36.6 C)    TempSrc: Oral    SpO2: 97%    Weight: 138 lb 8 oz (62.8 kg)    Height: 5\' 4"  (1.626 m)     Constitutional: Well-developed, well-nourished, appears comfortable  HENT: Normocephalic and atraumatic.  Eyes: EOM are normal. PERRL.  Neck: Normal range of motion.  Cardiovascular: Regular rate, regular rhythm. No murmurs, rubs, or gallops. Normal radial and PT pulses bilaterally. No LE edema.  Pulmonary: Normal respiratory effort. No wheezes, rales, or rhonchi. No crackles.  Abdominal: Soft. Non-distended. No tenderness. Normal bowel sounds.  Musculoskeletal: Normal range of motion.     Neurological: Alert and oriented to person, place, and time. Non-focal. Skin: warm and dry.    Assessment & Plan:   See Encounters Tab for problem based charting.  Cough Patient was seen in the ED on 5/7 after presenting with cough and SOB. Patient was diagnosed with bronchitis and discharged with prescription for prednisone 10 mg daily, doxycycline 100 mg BID, and tessalon 100 mg TID PRN with plan to f/u w/ [facility]. Patient has not taken these medications yet as she wanted to drink alcohol on her birthday. She plans on starting these medications today. Patient is still experiencing SOB but her cough has improved. She reports that her albuterol inhaler helps with her SOB. She recently stopped smoking cigarettes. No prior history of asthma or COPD noted in her chart and no prior PFTs. Will obtain PFTs to assess for asthma vs COPD.   Plan: - Continue albuterol inhaler - Referral for PFTs  HFrEF (heart failure with reduced ejection fraction) (HCC) Echo from 08/2015 demonstrates EF 45-50% w/ G1DD. Current medications include carvedilol 6.25 BID and Lasix 20 MG daily. Patient states that they are compliant with the carvedilol but was told that she did not need lasix. Patient reports exertional dyspnea and orthopnea but denies LE swelling. Patient  denies lightheadedness or dizziness. Patient reports that she has had some decreased appetite recently due to alcohol intake for her birthday and mothers day. Initial BP today is 97/50. Repeat BP is 111/59. Patient reports that she was previously seen by cardiology but has not been in a few years. No crackles on lung exam or lower extremity swelling to suggest acute CHF exacerbation. However, will obtain echo to assess for changes in her cardiac function given alcohol use and symptoms.  Plan: - Continue carvedilol 6.25 BID  - Stop Lasix 20 MG daily - Echo ordered  Alcohol use Patient has long-standing history of alcohol use. She is interested in discontinuing alcohol at this time. She would not like pharmacotherapy to help with her drinking at this time.   Plan: - Continue to reassess her willingness to try pharmacotherapy if she has difficultly discontinuing drinking   Health care maintenance Referral for colonoscopy ordered today.     Patient seen with Dr. Mayford Knife

## 2023-03-14 NOTE — Assessment & Plan Note (Signed)
Referral for colonoscopy ordered today. 

## 2023-03-16 NOTE — Progress Notes (Signed)
Internal Medicine Clinic Attending  Case discussed with Dr. Mapp  At the time of the visit.  We reviewed the resident's history and exam and pertinent patient test results.  I agree with the assessment, diagnosis, and plan of care documented in the resident's note.  

## 2023-03-21 ENCOUNTER — Ambulatory Visit
Admission: RE | Admit: 2023-03-21 | Discharge: 2023-03-21 | Disposition: A | Discharge status: Home / Self Care | Payer: Commercial Managed Care - HMO | Source: Ambulatory Visit | Attending: Obstetrics and Gynecology | Admitting: Obstetrics and Gynecology

## 2023-03-21 DIAGNOSIS — Z1231 Encounter for screening mammogram for malignant neoplasm of breast: Secondary | ICD-10-CM

## 2023-04-03 ENCOUNTER — Encounter: Payer: Self-pay | Admitting: *Deleted

## 2023-04-13 ENCOUNTER — Other Ambulatory Visit: Payer: Self-pay

## 2023-04-13 ENCOUNTER — Ambulatory Visit (INDEPENDENT_AMBULATORY_CARE_PROVIDER_SITE_OTHER): Payer: Commercial Managed Care - HMO

## 2023-04-13 VITALS — BP 127/66 | HR 70 | Temp 98.1°F | Ht 64.0 in | Wt 144.7 lb

## 2023-04-13 DIAGNOSIS — R051 Acute cough: Secondary | ICD-10-CM | POA: Diagnosis not present

## 2023-04-13 DIAGNOSIS — I5020 Unspecified systolic (congestive) heart failure: Secondary | ICD-10-CM | POA: Diagnosis not present

## 2023-04-13 NOTE — Patient Instructions (Addendum)
Thank you for coming to see [place] in clinic Ms. Coulson.  Plan:  - Please continue taking:     - Carvedilol 6.25 MG twice daily for your heart and blood pressure   - Albuterol inhaler for your shortness of breath  - We referred you to see the following providers (you will be called to schedule an appointment):     - Gastroenterology (stomach doctor) [facility] Gastroenterologist in [place], [place] Located in: [facility] [address] Address: [address], [place], [place] [address]    Phone: [phone]  - We ordered an echocardiogram (ultrasound of your heart) to assess your heart function (you will be called to schedule an appointment)  - We ordered pulmonary function testing (lung testing) to assess your lung function (you will be called to schedule an appointment)   It was very nice to see you, thank you for allowing [place] to be involved in your care. We look forward to seeing you next time. Please call our clinic at [phone] if you have any questions or concerns. The best time to call is Monday-Friday from 9am-4pm, but there is someone available 24/7. If after hours or the weekend, call the main hospital number at [phone] and ask for the Internal Medicine Resident On-Call. If you need medication refills, please notify your pharmacy one week in advance and they will send [place] a request.   Please make sure to arrive 15 minutes prior to your next appointment. If you arrive late, you may be asked to reschedule.

## 2023-04-13 NOTE — Assessment & Plan Note (Signed)
Patient was last seen in clinic for cough on 5/15. Patient had recent ED visit where she was diagnosed with bronchitis and discharged with prescription for prednisone 10 mg daily, doxycycline 100 mg BID, and tessalon 100 mg TID PRN. She was having improving symptoms at that time. Patient was counseled on tobacco cessation. There was also concern for possible asthma vs COPD. Albuterol inhaler and PFTs were ordered, however, PFTs were not yet performed. Patient reports that her cough has improved.    Plan: - Continue albuterol inhaler - Messaged [place] to help patient schedule PFTs

## 2023-04-13 NOTE — Progress Notes (Signed)
CC: HFrEF f/u  HPI:  [name] is a 62 y.o. female with past medical history of HFrEF, non-ischemic cardiomyopathy, CVA (2003), tobacco use, depression, insomnia that presents for HFrEF f/u.    Allergies as of 04/13/2023       Reactions   Iodinated Contrast Media Other (See Comments)   Seizure   Omnipaque [iohexol] Other (See Comments)   Seizure         Medication List        Accurate as of April 13, 2023  9:54 AM. If you have any questions, ask your nurse or doctor.          Acetaminophen Extra Strength 500 MG Caps Take 1 capsule by mouth 3 (three) times daily.   albuterol 108 (90 Base) MCG/ACT inhaler Commonly known as: VENTOLIN HFA Inhale 2 puffs into the lungs every 4 (four) hours as needed for wheezing or shortness of breath.   aspirin EC 81 MG tablet Take 81 mg by mouth daily. Swallow whole.   atorvastatin 40 MG tablet Commonly known as: LIPITOR Take 1 tablet (40 mg total) by mouth daily.   benzonatate 100 MG capsule Commonly known as: TESSALON Take 1 capsule (100 mg total) by mouth 3 (three) times daily as needed for cough.   carvedilol 6.25 MG tablet Commonly known as: COREG Take 1 tablet (6.25 mg total) by mouth 2 (two) times daily.   Cholecalciferol 25 MCG (1000 UT) capsule Take 1 capsule (1,000 Units total) by mouth daily.   cyanocobalamin 1000 MCG tablet Commonly known as: VITAMIN B12 Take 1 tablet (1,000 mcg total) by mouth daily.   diclofenac Sodium 1 % Gel Commonly known as: VOLTAREN Apply 2 g topically 4 (four) times daily.   gabapentin 300 MG capsule Commonly known as: NEURONTIN Take 1 capsule (300 mg total) by mouth 3 (three) times daily.   nitroGLYCERIN 0.4 MG SL tablet Commonly known as: NITROSTAT Place 1 tablet (0.4 mg total) under the tongue every 5 (five) minutes as needed for chest pain.   omeprazole 40 MG capsule Commonly known as: PRILOSEC Take 1 capsule (40 mg total) by mouth daily for 14 days.    predniSONE 10 MG tablet Commonly known as: DELTASONE Take 2 tablets (20 mg total) by mouth daily.   terbinafine 250 MG tablet Commonly known as: LAMISIL Take 1 tablet (250 mg total) by mouth daily.   traZODone 50 MG tablet Commonly known as: DESYREL Take 1 tablet (50 mg total) by mouth at bedtime as needed for sleep.         Past Medical History:  Diagnosis Date   Alcoholism (HCC)    ANEMIA, VITAMIN B12 DEFICIENCY 11/11/2007   Documented low 175 on 09/2007; received 6 yrs of IM therapy; switched to po 05/2013    Anxiety    Breast pain, left 06/02/2021   Cardiac arrest (HCC) 01/16/2013   witnessed, shockable rhythm, initally low EF, no CAD at cath. Drug screen negative.   Chest pain    a. 04/2009 Echo: EF 55-60%, Gr1 DD, Mild MR.   Depression    Duodenitis    secondary to H pylori(treatment completed 05/2006), +/- NSAIDS   Duodenitis    a. 05/2006 2/2 H pylori +/- NSAIDS   DVT (deep venous thrombosis) (HCC)    a. 04/2009 - treated with coumadin x 8 mos.   ETOH abuse    Heart murmur    History of cocaine abuse (HCC)    History of CVA (cerebrovascular accident)  Hypertension    Left arm pain 06/11/2017   PE 05/16/2009   CT angio positive for PE in 07/10, treated with coumadin for 8 months.    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 04/2009 - treated with coumadin x 8 mos.   Rectal bleeding 2007   internal hemmorhoids by colonoscopy in 04/2006, Bx neg for IBD   Rectal bleeding    a. int hemorrhoids by colonoscopy in 04/2006, Bx neg for IBD   Right foot pain 05/05/2020   Seizures (HCC)    Shortness of breath 05/28/2020   Stroke (HCC)    Tobacco abuse    Trochanteric bursitis of left hip 05/22/2016   Review of Systems:  per HPI.   Physical Exam: Vitals:   04/13/23 0928  BP: 127/66  Pulse: 70  Temp: 98.1 F (36.7 C)  TempSrc: Oral  SpO2: 100%  Weight: 144 lb 11.2 oz (65.6 kg)  Height: 5\' 4"  (1.626 m)   Constitutional: Well-developed, well-nourished,  appears comfortable  HENT: Normocephalic and atraumatic.  Eyes: EOMI.  Neck: Normal range of motion.  Cardiovascular: Regular rate, regular rhythm. No murmurs, rubs, or gallops. Normal radial and PT pulses bilaterally. No LE edema.  Pulmonary: Normal respiratory effort. No wheezes or crackles.   Abdominal: Soft. Non-distended. No tenderness. Normal bowel sounds.  Musculoskeletal: Normal gait.     Neurological: Alert and oriented to person, place, and time. Non-focal. Skin: warm and dry.    Assessment & Plan:   See Encounters Tab for problem based charting.  HFrEF (heart failure with reduced ejection fraction) (HCC) Echo from 08/2015 demonstrates EF 45-50% w/ G1DD. Current medications include carvedilol 6.25 MG BID. Lasix 20 mg daily was discontinued at our last visit on 5/15 due to low  BP. Patient states that they are compliant with this medication regimen. Weight today is up 6 lbs from last month. Patient endorses intermittent exertional dyspnea. Patient denies HA, lightheadedness, dizziness, CP, palpitations, orthopnea, or LE swelling. Initial BP today is 127/66. No signs of volume overload on exam. Plan at last visit was for repeat echo, which has not been done yet.   Plan: - Continue carvedilol 6.25 MG BID - Messaged [facility] to help patient schedule echo  Cough Patient was last seen in clinic for cough on 5/15. Patient had recent ED visit where she was diagnosed with bronchitis and discharged with prescription for prednisone 10 mg daily, doxycycline 100 mg BID, and tessalon 100 mg TID PRN. She was having improving symptoms at that time. Patient was counseled on tobacco cessation. There was also concern for possible asthma vs COPD. Albuterol inhaler and PFTs were ordered, however, PFTs were not yet performed. Patient reports that her cough has improved.    Plan: - Continue albuterol inhaler - Messaged [facility] to help patient schedule PFTs    Patient seen with Dr. Antony Contras

## 2023-04-13 NOTE — Assessment & Plan Note (Signed)
Echo from 08/2015 demonstrates EF 45-50% w/ G1DD. Current medications include carvedilol 6.25 MG BID. Lasix 20 mg daily was discontinued at our last visit on 5/15 due to low  BP. Patient states that they are compliant with this medication regimen. Weight today is up 6 lbs from last month. Patient endorses intermittent exertional dyspnea. Patient denies HA, lightheadedness, dizziness, CP, palpitations, orthopnea, or LE swelling. Initial BP today is 127/66. No signs of volume overload on exam. Plan at last visit was for repeat echo, which has not been done yet.   Plan: - Continue carvedilol 6.25 MG BID - Messaged [facility] to help patient schedule echo

## 2023-04-16 ENCOUNTER — Encounter (INDEPENDENT_AMBULATORY_CARE_PROVIDER_SITE_OTHER): Payer: Commercial Managed Care - HMO | Admitting: Podiatry

## 2023-04-16 DIAGNOSIS — Z91199 Patient's noncompliance with other medical treatment and regimen due to unspecified reason: Secondary | ICD-10-CM

## 2023-04-16 NOTE — Progress Notes (Signed)
Internal Medicine Clinic Attending  Case discussed with Dr. Mapp  At the time of the visit.  We reviewed the resident's history and exam and pertinent patient test results.  I agree with the assessment, diagnosis, and plan of care documented in the resident's note.  

## 2023-04-18 NOTE — Progress Notes (Signed)
This encounter was created in error - please disregard.  Patient was a no show for scheduled appt today. 

## 2023-04-19 ENCOUNTER — Encounter: Payer: Self-pay | Admitting: Internal Medicine

## 2023-05-07 ENCOUNTER — Ambulatory Visit (HOSPITAL_COMMUNITY)
Admission: RE | Admit: 2023-05-07 | Discharge: 2023-05-07 | Disposition: A | Discharge status: Home / Self Care | Payer: Commercial Managed Care - HMO | Source: Ambulatory Visit | Attending: Internal Medicine | Admitting: Internal Medicine

## 2023-05-07 ENCOUNTER — Ambulatory Visit (HOSPITAL_BASED_OUTPATIENT_CLINIC_OR_DEPARTMENT_OTHER)
Admission: RE | Admit: 2023-05-07 | Discharge: 2023-05-07 | Disposition: A | Discharge status: Home / Self Care | Payer: Commercial Managed Care - HMO | Source: Ambulatory Visit | Attending: Internal Medicine | Admitting: Internal Medicine

## 2023-05-07 DIAGNOSIS — I5030 Unspecified diastolic (congestive) heart failure: Secondary | ICD-10-CM

## 2023-05-07 DIAGNOSIS — I428 Other cardiomyopathies: Secondary | ICD-10-CM

## 2023-05-07 DIAGNOSIS — R0609 Other forms of dyspnea: Secondary | ICD-10-CM

## 2023-05-07 DIAGNOSIS — I081 Rheumatic disorders of both mitral and tricuspid valves: Secondary | ICD-10-CM | POA: Diagnosis not present

## 2023-05-07 LAB — ECHOCARDIOGRAM COMPLETE
Area-P 1/2: 4.06 cm2
Calc EF: 55.7 %
Radius: 0.4 cm
S' Lateral: 2.9 cm
Single Plane A2C EF: 53.5 %
Single Plane A4C EF: 55.4 %

## 2023-05-07 LAB — PULMONARY FUNCTION TEST
DL/VA % pred: 93 %
DL/VA: 3.94 ml/min/mmHg/L
DLCO unc % pred: 52 %
DLCO unc: 10.63 ml/min/mmHg
FEF 25-75 Post: 1.03 L/sec
FEF 25-75 Pre: 1.38 L/sec
FEF2575-%Change-Post: -25 %
FEF2575-%Pred-Post: 44 %
FEF2575-%Pred-Pre: 59 %
FEV1-%Change-Post: -5 %
FEV1-%Pred-Post: 56 %
FEV1-%Pred-Pre: 59 %
FEV1-Post: 1.42 L
FEV1-Pre: 1.5 L
FEV1FVC-%Change-Post: -2 %
FEV1FVC-%Pred-Pre: 101 %
FEV6-%Change-Post: -2 %
FEV6-%Pred-Post: 58 %
FEV6-%Pred-Pre: 59 %
FEV6-Post: 1.85 L
FEV6-Pre: 1.89 L
FEV6FVC-%Pred-Post: 103 %
FEV6FVC-%Pred-Pre: 103 %
FVC-%Change-Post: -2 %
FVC-%Pred-Post: 56 %
FVC-%Pred-Pre: 57 %
FVC-Post: 1.85 L
FVC-Pre: 1.9 L
Post FEV1/FVC ratio: 77 %
Post FEV6/FVC ratio: 100 %
Pre FEV1/FVC ratio: 79 %
Pre FEV6/FVC Ratio: 100 %
RV % pred: 61 %
RV: 1.24 L
TLC % pred: 64 %
TLC: 3.24 L

## 2023-05-07 MED ORDER — ALBUTEROL SULFATE (2.5 MG/3ML) 0.083% IN NEBU
2.5000 mg | INHALATION_SOLUTION | Freq: Once | RESPIRATORY_TRACT | Status: AC
  Administered 2023-05-07: 2.5 mg via RESPIRATORY_TRACT

## 2023-05-07 NOTE — Progress Notes (Signed)
Echocardiogram 2D Echocardiogram has been performed.  [name] 05/07/2023, 9:45 AM

## 2023-05-08 ENCOUNTER — Other Ambulatory Visit (HOSPITAL_COMMUNITY): Payer: Self-pay

## 2023-05-08 ENCOUNTER — Other Ambulatory Visit: Payer: Self-pay

## 2023-05-08 ENCOUNTER — Telehealth: Payer: Self-pay

## 2023-05-08 ENCOUNTER — Other Ambulatory Visit (INDEPENDENT_AMBULATORY_CARE_PROVIDER_SITE_OTHER): Payer: Commercial Managed Care - HMO | Admitting: Internal Medicine

## 2023-05-08 DIAGNOSIS — I5020 Unspecified systolic (congestive) heart failure: Secondary | ICD-10-CM

## 2023-05-08 DIAGNOSIS — Z720 Tobacco use: Secondary | ICD-10-CM

## 2023-05-08 DIAGNOSIS — F17210 Nicotine dependence, cigarettes, uncomplicated: Secondary | ICD-10-CM

## 2023-05-08 DIAGNOSIS — J984 Other disorders of lung: Secondary | ICD-10-CM

## 2023-05-08 MED ORDER — NICOTINE 7 MG/24HR TD PT24
7.0000 mg | MEDICATED_PATCH | TRANSDERMAL | 0 refills | Status: AC
Start: 2023-06-19 — End: 2023-06-26
  Filled 2023-05-08: qty 14, 14d supply, fill #0

## 2023-05-08 MED ORDER — VARENICLINE TARTRATE 0.5 MG PO TABS
ORAL_TABLET | ORAL | 0 refills | Status: AC
Start: 2023-05-08 — End: 2023-06-07
  Filled 2023-05-08: qty 105, 30d supply, fill #0

## 2023-05-08 MED ORDER — NICOTINE 14 MG/24HR TD PT24
14.0000 mg | MEDICATED_PATCH | TRANSDERMAL | 0 refills | Status: AC
  Filled 2023-05-08: qty 42, 42d supply, fill #0

## 2023-05-08 MED ORDER — NICOTINE 7 MG/24HR TD PT24
MEDICATED_PATCH | TRANSDERMAL | 0 refills | Status: DC
Start: 2023-05-08 — End: 2023-05-08
  Filled 2023-05-08: qty 56, 28d supply, fill #0

## 2023-05-08 NOTE — Telephone Encounter (Signed)
Patient called she is requesting a call back regarding her results from her PFT and echocardiogram, please return patients call.

## 2023-05-08 NOTE — Progress Notes (Addendum)
Patient with history of HFmrEF and unconfirmed COPD presented with progressive dyspnea. She states that shortness of breath started this year. She can go up stairs but has severe SOB. This is a change since last year. She has tobacco use history for last 14 years at most 1/2 pack daily she continues smoke 1-2 cigarettes.  Repeat echo showed ef at 55-60% and PFT showed severe restriction with moderate diffusion defect. Findings concerning for ILD.   She has been weighing herself daily and has gained about 4 lbs. Lasix was discontinued this spring. I asked that she follow-up next week for in-person evaluation. She is interested in smoking cessation. P: High resolution CT ordered Referral to pulm placed Chantix  Nicotine patch 14 mg for 42 days then 7mg  for 7 days

## 2023-05-08 NOTE — Addendum Note (Signed)
Addended by: Lucille Passy on: 05/08/2023 02:27 PM   Modules accepted: Orders

## 2023-05-09 ENCOUNTER — Other Ambulatory Visit (HOSPITAL_COMMUNITY): Payer: Self-pay

## 2023-05-10 ENCOUNTER — Other Ambulatory Visit (HOSPITAL_COMMUNITY): Payer: Self-pay

## 2023-05-16 ENCOUNTER — Ambulatory Visit (AMBULATORY_SURGERY_CENTER): Payer: Commercial Managed Care - HMO

## 2023-05-16 ENCOUNTER — Telehealth: Payer: Self-pay

## 2023-05-16 ENCOUNTER — Other Ambulatory Visit (HOSPITAL_COMMUNITY): Payer: Self-pay

## 2023-05-16 VITALS — Ht 64.0 in | Wt 140.0 lb

## 2023-05-16 DIAGNOSIS — Z1211 Encounter for screening for malignant neoplasm of colon: Secondary | ICD-10-CM

## 2023-05-16 MED ORDER — NA SULFATE-K SULFATE-MG SULF 17.5-3.13-1.6 GM/177ML PO SOLN
1.0000 | Freq: Once | ORAL | 0 refills | Status: AC
Start: 2023-05-16 — End: 2023-05-31
  Filled 2023-05-16: qty 354, 1d supply, fill #0
  Filled 2023-05-30: qty 354, 2d supply, fill #0
  Filled ????-??-??: fill #0

## 2023-05-16 NOTE — Telephone Encounter (Signed)
Patient stated that she had stroke in 2022 & last siezure in 2023.  EF is 55-60% and there is no aortic stenosis.

## 2023-05-16 NOTE — Progress Notes (Signed)
No egg or soy allergy known to patient  No issues known to pt with past sedation with any surgeries or procedures Patient denies ever being told they had issues or difficulty with intubation  No FH of Malignant Hyperthermia Pt is not on diet pills Pt is not on  home 02  Pt is not on blood thinners  Pt denies issues with constipation  No A fib or A flutter Have any cardiac testing pending--no Pt instructed to use Singlecare.[email or web address] or GoodRx for a price reduction on prep  Can ambulate independently Last Stroke 2022 Last seizure 2023

## 2023-05-17 ENCOUNTER — Other Ambulatory Visit (HOSPITAL_COMMUNITY): Payer: Self-pay

## 2023-05-21 ENCOUNTER — Other Ambulatory Visit (HOSPITAL_COMMUNITY): Payer: Self-pay

## 2023-05-30 ENCOUNTER — Other Ambulatory Visit: Payer: Self-pay

## 2023-05-30 ENCOUNTER — Other Ambulatory Visit (HOSPITAL_COMMUNITY): Payer: Self-pay

## 2023-05-31 ENCOUNTER — Ambulatory Visit (INDEPENDENT_AMBULATORY_CARE_PROVIDER_SITE_OTHER): Payer: Commercial Managed Care - HMO | Admitting: Student

## 2023-05-31 ENCOUNTER — Other Ambulatory Visit (HOSPITAL_COMMUNITY): Payer: Self-pay

## 2023-05-31 ENCOUNTER — Other Ambulatory Visit (HOSPITAL_COMMUNITY)
Admission: RE | Admit: 2023-05-31 | Discharge: 2023-05-31 | Disposition: A | Discharge status: Home / Self Care | Payer: Commercial Managed Care - HMO | Source: Ambulatory Visit | Attending: Internal Medicine | Admitting: Internal Medicine

## 2023-05-31 VITALS — BP 127/65 | HR 73 | Temp 97.9°F | Wt 145.9 lb

## 2023-05-31 DIAGNOSIS — R3589 Other polyuria: Secondary | ICD-10-CM | POA: Insufficient documentation

## 2023-05-31 DIAGNOSIS — Z113 Encounter for screening for infections with a predominantly sexual mode of transmission: Secondary | ICD-10-CM | POA: Diagnosis present

## 2023-05-31 DIAGNOSIS — I6930 Unspecified sequelae of cerebral infarction: Secondary | ICD-10-CM | POA: Diagnosis not present

## 2023-05-31 DIAGNOSIS — E538 Deficiency of other specified B group vitamins: Secondary | ICD-10-CM

## 2023-05-31 DIAGNOSIS — M79605 Pain in left leg: Secondary | ICD-10-CM

## 2023-05-31 DIAGNOSIS — D649 Anemia, unspecified: Secondary | ICD-10-CM | POA: Diagnosis not present

## 2023-05-31 DIAGNOSIS — I739 Peripheral vascular disease, unspecified: Secondary | ICD-10-CM

## 2023-05-31 DIAGNOSIS — E559 Vitamin D deficiency, unspecified: Secondary | ICD-10-CM | POA: Diagnosis not present

## 2023-05-31 DIAGNOSIS — M79604 Pain in right leg: Secondary | ICD-10-CM

## 2023-05-31 DIAGNOSIS — I5020 Unspecified systolic (congestive) heart failure: Secondary | ICD-10-CM

## 2023-05-31 NOTE — Assessment & Plan Note (Signed)
Patient endorses polyuria for the last few weeks despite normal water intake. Patient endorses mild pruritus but denies, dysuria/discharge. Patient has been treated for trichomonas infection in the past. -Order UA with reflex microscopy -Order vaginal swab for GC/Chlamydia, Trichomonas, and BV

## 2023-05-31 NOTE — Progress Notes (Signed)
CC: Follow-up; leg pain; increased urination  HPI:  [name] is a 62 y.o. female living with a history stated below and presents today for follow-up and concerns of bilateral leg pain and polyuria. Please see problem based assessment and plan for additional details.  Past Medical History:  Diagnosis Date   Alcoholism (HCC)    ANEMIA, VITAMIN B12 DEFICIENCY 11/11/2007   Documented low 175 on 09/2007; received 6 yrs of IM therapy; switched to po 05/2013    Anxiety    Breast pain, left 06/02/2021   Cardiac arrest (HCC) 01/16/2013   witnessed, shockable rhythm, initally low EF, no CAD at cath. Drug screen negative.   Chest pain    a. 04/2009 Echo: EF 55-60%, Gr1 DD, Mild MR.   Depression    Duodenitis    secondary to H pylori(treatment completed 05/2006), +/- NSAIDS   Duodenitis    a. 05/2006 2/2 H pylori +/- NSAIDS   DVT (deep venous thrombosis) (HCC)    a. 04/2009 - treated with coumadin x 8 mos.   ETOH abuse    Heart murmur    History of cocaine abuse (HCC)    History of CVA (cerebrovascular accident)    Hypertension    Left arm pain 06/11/2017   PE 05/16/2009   CT angio positive for PE in 07/10, treated with coumadin for 8 months.    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 04/2009 - treated with coumadin x 8 mos.   Rectal bleeding 2007   internal hemmorhoids by colonoscopy in 04/2006, Bx neg for IBD   Rectal bleeding    a. int hemorrhoids by colonoscopy in 04/2006, Bx neg for IBD   Right foot pain 05/05/2020   Seizures (HCC)    Shortness of breath 05/28/2020   Stroke (HCC)    Tobacco abuse    Trochanteric bursitis of left hip 05/22/2016    Current Outpatient Medications on File Prior to Visit  Medication Sig Dispense Refill   Acetaminophen Extra Strength 500 MG CAPS Take 1 capsule by mouth 3 (three) times daily.     albuterol (VENTOLIN HFA) 108 (90 Base) MCG/ACT inhaler Inhale 2 puffs into the lungs every 4 (four) hours as needed for wheezing or shortness  of breath. 6.7 g 0   aspirin EC 81 MG tablet Take 81 mg by mouth daily. Swallow whole.     atorvastatin (LIPITOR) 40 MG tablet Take 1 tablet (40 mg total) by mouth daily. 90 tablet 3   benzonatate (TESSALON) 100 MG capsule Take 1 capsule (100 mg total) by mouth 3 (three) times daily as needed for cough. 21 capsule 0   carvedilol (COREG) 6.25 MG tablet Take 1 tablet (6.25 mg total) by mouth 2 (two) times daily. 180 tablet 3   diclofenac Sodium (VOLTAREN) 1 % GEL Apply 2 g topically 4 (four) times daily. 100 g 0   gabapentin (NEURONTIN) 300 MG capsule Take 1 capsule (300 mg total) by mouth 3 (three) times daily. 270 capsule 3   Na Sulfate-K Sulfate-Mg Sulf 17.5-3.13-1.6 GM/177ML SOLN Use as directed 354 mL 0   nicotine (NICODERM CQ - DOSED IN MG/24 HOURS) 14 mg/24hr patch Place 1 patch (14 mg total) onto the skin daily. 42 patch 0   [START ON 06/19/2023] nicotine (NICODERM CQ - DOSED IN MG/24 HR) 7 mg/24hr patch Place 1 patch (7 mg total) onto the skin daily for 7 days. (Patient not taking: Reported on 05/16/2023) 14 patch 0  nitroGLYCERIN (NITROSTAT) 0.4 MG SL tablet Place 1 tablet (0.4 mg total) under the tongue every 5 (five) minutes as needed for chest pain. 25 tablet 1   terbinafine (LAMISIL) 250 MG tablet Take 1 tablet (250 mg total) by mouth daily. (Patient not taking: Reported on 05/16/2023) 90 tablet 0   traZODone (DESYREL) 50 MG tablet Take 1 tablet (50 mg total) by mouth at bedtime as needed for sleep. 30 tablet 0   varenicline (CHANTIX) 0.5 MG tablet Take 1 tablet  by mouth daily for 3 days, THEN 1 tablet  2 (two) times daily for 3 days, THEN 2 tablets  2 (two) times daily for 24 days. (Patient not taking: Reported on 05/16/2023) 105 tablet 0   vitamin B-12 (CYANOCOBALAMIN) 1000 MCG tablet Take 1 tablet (1,000 mcg total) by mouth daily. 30 tablet 2   No current facility-administered medications on file prior to visit.    Family History  Problem Relation Age of Onset   Cancer Mother     Breast cancer Mother 14   Heart disease Mother    Diabetes Mother    CAD Mother    Heart attack Mother    Hypertension Mother    Cancer Father    Heart disease Father    Colon cancer Father    CAD Father    Cancer Sister    Breast cancer Sister 57   Diabetes Sister    Hypertension Sister    Heart disease Brother    Diabetes Brother    CAD Brother    Breast cancer Niece 52   Coronary artery disease Other        1 st degree female relative <60 and female <50   Diabetes type II Other        1 st degree relative   Colon polyps Neg Hx    Esophageal cancer Neg Hx    Rectal cancer Neg Hx    Stomach cancer Neg Hx     Social History   Socioeconomic History   Marital status: Single    Spouse name: Not on file   Number of children: 2   Years of education: Not on file   Highest education level: 11th grade  Occupational History   Occupation: Unemployed  Tobacco Use   Smoking status: Some Days    Current packs/day: 0.10    Types: Cigarettes   Smokeless tobacco: Never   Tobacco comments:    smokes about 1-2 cigs/day  Vaping Use   Vaping status: Never Used  Substance and Sexual Activity   Alcohol use: Yes    Comment: beer   Drug use: Yes    Types: Marijuana   Sexual activity: Yes    Partners: Male    Birth control/protection: Post-menopausal  Other Topics Concern   Not on file  Social History Narrative   ** Merged History Encounter **       ** Data from: 12/05/12 Enc Dept: [facility]   No cocaine or alcohol use since hospital discharge 05/19/2009.      10/13 update:  States she is smoking 1-2 cigarettes daily with at least 1 drink daily usually Brandy.                  ** Data from: 01/17/13 Enc Dept: [facility]   Pt lives in [place] with a roommate.      Social Determinants of Health   Financial Resource Strain: Low Risk  (03/14/2023)   Overall Financial  Resource Strain (CARDIA)    Difficulty of Paying Living Expenses: Not hard at all  Food  Insecurity: No Food Insecurity (03/14/2023)   Hunger Vital Sign    Worried About Running Out of Food in the Last Year: Never true    Ran Out of Food in the Last Year: Never true  Transportation Needs: Unmet Transportation Needs (03/14/2023)   PRAPARE - Transportation    Lack of Transportation (Medical): Yes    Lack of Transportation (Non-Medical): Yes  Physical Activity: Sufficiently Active (03/14/2023)   Exercise Vital Sign    Days of Exercise per Week: 7 days    Minutes of Exercise per Session: 30 min  Stress: No Stress Concern Present (03/14/2023)   [employer] of Occupational Health - Occupational Stress Questionnaire    Feeling of Stress : Not at all  Social Connections: Moderately Isolated (03/14/2023)   Social Connection and Isolation Panel [NHANES]    Frequency of Communication with Friends and Family: More than three times a week    Frequency of Social Gatherings with Friends and Family: Once a week    Attends Religious Services: More than 4 times per year    Active Member of [employer] or Organizations: No    Attends Banker Meetings: Never    Marital Status: Never married  Intimate Partner Violence: Not At Risk (03/14/2023)   Humiliation, Afraid, Rape, and Kick questionnaire    Fear of Current or Ex-Partner: No    Emotionally Abused: No    Physically Abused: No    Sexually Abused: No    Review of Systems: ROS negative except for what is noted on the assessment and plan.  Vitals:   05/31/23 1011  BP: 127/65  Pulse: 73  Temp: 97.9 F (36.6 C)  TempSrc: Oral  SpO2: 100%  Weight: 145 lb 14.4 oz (66.2 kg)    Physical Exam: Constitutional: well-appearing, sitting in chair, in no acute distress Cardiovascular: regular rate and rhythm, no m/r/g Pulmonary/Chest: normal work of breathing on room air, lungs clear to auscultation bilaterally Abdominal: soft, non-tender, non-distended MSK: normal bulk and tone Neurological: alert & oriented x 3, no focal  deficit Skin: warm and dry Psych: normal mood and behavior  Assessment & Plan:     Patient seen with Dr. Criselda Peaches  HFrEF (heart failure with reduced ejection fraction) (HCC) Patient with previous history of HFrEF but updated echocardiogram from earlier this month noted improvement in her EF to 55-60%. Patient managed with Coreg 6.25/BID. Patient euvolemic on exam. -Continue Coreg  Normocytic anemia Patient has a history of normocytic anemia with most recent Hgb from May 2024 being 11.0. Patient denies light-headedness/weakness. -Order CBC   Leg pain, bilateral Patient endorses generalized bilateral leg pain that is worse with exertion. Patient previously started on Lipitor due to a previous CVA but has not been taking it. Lipid panel last measured in May 2023 showed an LDL of 57 but previous measurements show significant elevation in both LDL and total cholesterol. Patient also has a history of Vitamin D/B12 deficiency, -Order ABI -Order Lipid Panel -Order Vitamin D/B12  Polyuria Patient endorses polyuria for the last few weeks despite normal water intake. Patient endorses mild pruritus but denies, dysuria/discharge. Patient has been treated for trichomonas infection in the past. -Order UA with reflex microscopy -Order vaginal swab for GC/Chlamydia, Trichomonas, and BV   Carmina Miller, D.O. [facility] Internal Medicine, PGY-1 Phone: [phone] Date 05/31/2023 Time 1:41 PM

## 2023-05-31 NOTE — Assessment & Plan Note (Signed)
Patient with previous history of HFrEF but updated echocardiogram from earlier this month noted improvement in her EF to 55-60%. Patient managed with Coreg 6.25/BID. Patient euvolemic on exam. -Continue Coreg

## 2023-05-31 NOTE — Assessment & Plan Note (Addendum)
Patient endorses generalized bilateral leg pain that is worse with exertion. Patient previously started on Lipitor due to a previous CVA but has not been taking it. Lipid panel last measured in May 2023 showed an LDL of 57 but previous measurements show significant elevation in both LDL and total cholesterol. Patient also has a history of Vitamin D/B12 deficiency, -Order ABI -Order Lipid Panel -Order Vitamin D/B12

## 2023-05-31 NOTE — Assessment & Plan Note (Signed)
Patient has a history of normocytic anemia with most recent Hgb from May 2024 being 11.0. Patient denies light-headedness/weakness. -Order CBC

## 2023-05-31 NOTE — Patient Instructions (Addendum)
Thank you for allowing me to be a part of your care team. Today we discussed a few things: I have ordered an exam (you should get a call to schedule it) and labs that will investigate the pain you have been experiencing in your legs when walking We are also doing a vaginal swab to rule out an infectious cause of the increased urination you have been experiencing Please make your appointment with pulmonology (lung doctor) We also did some labs to investigate your history of anemia  Please let me know if you have any questions.

## 2023-06-01 ENCOUNTER — Other Ambulatory Visit: Payer: Commercial Managed Care - HMO

## 2023-06-01 NOTE — Addendum Note (Signed)
Addended by: Carmina Miller on: 06/01/2023 11:50 AM   Modules accepted: Orders

## 2023-06-04 NOTE — Progress Notes (Signed)
Internal Medicine Clinic Attending  I was physically present during the key portions of the resident provided service and participated in the medical decision making of patient's management care. I reviewed pertinent patient test results.  The assessment, diagnosis, and plan were formulated together and I agree with the documentation in the resident's note.  Inez Catalina, MD

## 2023-06-05 ENCOUNTER — Encounter: Payer: Commercial Managed Care - HMO | Admitting: Internal Medicine

## 2023-06-05 ENCOUNTER — Telehealth: Payer: Self-pay | Admitting: Internal Medicine

## 2023-06-05 ENCOUNTER — Other Ambulatory Visit (INDEPENDENT_AMBULATORY_CARE_PROVIDER_SITE_OTHER): Payer: Commercial Managed Care - HMO

## 2023-06-05 DIAGNOSIS — D649 Anemia, unspecified: Secondary | ICD-10-CM

## 2023-06-05 DIAGNOSIS — E538 Deficiency of other specified B group vitamins: Secondary | ICD-10-CM

## 2023-06-05 DIAGNOSIS — R3589 Other polyuria: Secondary | ICD-10-CM

## 2023-06-05 DIAGNOSIS — I6930 Unspecified sequelae of cerebral infarction: Secondary | ICD-10-CM

## 2023-06-05 DIAGNOSIS — Z113 Encounter for screening for infections with a predominantly sexual mode of transmission: Secondary | ICD-10-CM

## 2023-06-05 DIAGNOSIS — E559 Vitamin D deficiency, unspecified: Secondary | ICD-10-CM

## 2023-06-05 NOTE — Telephone Encounter (Signed)
Good Morning Dr. Marina Goodell,  I called this patient at 10:45 am today to  see if was coming for her procedure.  She said she forgot all about the procedure and was at the a different doctors off now.  She will call and reshedule.   I will NO SHOW patient  [name]

## 2023-06-07 ENCOUNTER — Other Ambulatory Visit: Payer: Self-pay | Admitting: Student

## 2023-06-07 ENCOUNTER — Other Ambulatory Visit (HOSPITAL_COMMUNITY): Payer: Self-pay

## 2023-06-07 DIAGNOSIS — I428 Other cardiomyopathies: Secondary | ICD-10-CM

## 2023-06-07 MED ORDER — CHOLECALCIFEROL 25 MCG (1000 UT) PO CHEW
1.0000 | CHEWABLE_TABLET | Freq: Every day | ORAL | 3 refills | Status: DC
Start: 2023-06-07 — End: 2023-08-31
  Filled 2023-06-07: qty 30, 30d supply, fill #0

## 2023-06-07 MED ORDER — ATORVASTATIN CALCIUM 40 MG PO TABS
40.0000 mg | ORAL_TABLET | Freq: Every day | ORAL | 3 refills | Status: DC
Start: 2023-06-07 — End: 2023-08-08
  Filled 2023-06-07: qty 90, 90d supply, fill #0

## 2023-06-07 NOTE — Progress Notes (Signed)
Vitamin D low at 17.2. D3 ordered. Triglycerides elevated at 371 (patient fasted before exam). LDL is 88 but patient has history of CVA. Has not been taking Lipitor. Will re-order Lipitor and re-evaluate at 3 month follow-up for goal < 55. A1c also elevated at 6.0 (no prior elevations). Counseled on diet and exercise for now.

## 2023-06-08 ENCOUNTER — Other Ambulatory Visit (HOSPITAL_COMMUNITY): Payer: Self-pay

## 2023-06-14 ENCOUNTER — Ambulatory Visit (HOSPITAL_COMMUNITY)
Admission: RE | Admit: 2023-06-14 | Discharge: 2023-06-14 | Disposition: A | Discharge status: Home / Self Care | Payer: Commercial Managed Care - HMO | Source: Ambulatory Visit | Attending: Internal Medicine | Admitting: Internal Medicine

## 2023-06-14 DIAGNOSIS — I739 Peripheral vascular disease, unspecified: Secondary | ICD-10-CM | POA: Diagnosis present

## 2023-06-14 LAB — VAS US ABI WITH/WO TBI
Left ABI: 1.08
Right ABI: 1.01

## 2023-06-19 ENCOUNTER — Other Ambulatory Visit (HOSPITAL_COMMUNITY): Payer: Self-pay

## 2023-08-08 ENCOUNTER — Ambulatory Visit (INDEPENDENT_AMBULATORY_CARE_PROVIDER_SITE_OTHER): Payer: MEDICAID | Admitting: Student

## 2023-08-08 ENCOUNTER — Encounter: Payer: Self-pay | Admitting: Student

## 2023-08-08 ENCOUNTER — Other Ambulatory Visit: Payer: Self-pay

## 2023-08-08 VITALS — BP 131/73 | HR 67 | Temp 98.0°F | Resp 28 | Ht 64.0 in | Wt 145.7 lb

## 2023-08-08 DIAGNOSIS — F32A Depression, unspecified: Secondary | ICD-10-CM | POA: Diagnosis not present

## 2023-08-08 DIAGNOSIS — I739 Peripheral vascular disease, unspecified: Secondary | ICD-10-CM | POA: Diagnosis not present

## 2023-08-08 DIAGNOSIS — I6930 Unspecified sequelae of cerebral infarction: Secondary | ICD-10-CM

## 2023-08-08 DIAGNOSIS — Z23 Encounter for immunization: Secondary | ICD-10-CM

## 2023-08-08 DIAGNOSIS — R053 Chronic cough: Secondary | ICD-10-CM

## 2023-08-08 DIAGNOSIS — G629 Polyneuropathy, unspecified: Secondary | ICD-10-CM | POA: Diagnosis not present

## 2023-08-08 DIAGNOSIS — R059 Cough, unspecified: Secondary | ICD-10-CM | POA: Diagnosis not present

## 2023-08-08 DIAGNOSIS — I428 Other cardiomyopathies: Secondary | ICD-10-CM

## 2023-08-08 MED ORDER — ATORVASTATIN CALCIUM 40 MG PO TABS
40.0000 mg | ORAL_TABLET | Freq: Every day | ORAL | 3 refills | Status: DC
Start: 2023-08-08 — End: 2023-08-31

## 2023-08-08 MED ORDER — GABAPENTIN 300 MG PO CAPS
300.0000 mg | ORAL_CAPSULE | Freq: Three times a day (TID) | ORAL | 3 refills | Status: DC
Start: 2023-08-08 — End: 2023-10-15

## 2023-08-08 MED ORDER — ASPIRIN 81 MG PO TBEC: 81.0000 mg | DELAYED_RELEASE_TABLET | Freq: Every day | ORAL | 2 refills | Status: DC

## 2023-08-08 MED ORDER — ALBUTEROL SULFATE HFA 108 (90 BASE) MCG/ACT IN AERS: 2.0000 | INHALATION_SPRAY | RESPIRATORY_TRACT | 0 refills | Status: DC | PRN

## 2023-08-08 MED ORDER — TRAZODONE HCL 50 MG PO TABS
50.0000 mg | ORAL_TABLET | Freq: Every evening | ORAL | 0 refills | Status: DC | PRN
Start: 2023-08-08 — End: 2024-02-25

## 2023-08-08 MED ORDER — ALBUTEROL SULFATE HFA 108 (90 BASE) MCG/ACT IN AERS
2.0000 | INHALATION_SPRAY | RESPIRATORY_TRACT | 0 refills | Status: DC | PRN
Start: 2023-08-08 — End: 2023-08-28

## 2023-08-08 MED ORDER — BUPROPION HCL ER (XL) 150 MG PO TB24: 150.0000 mg | ORAL_TABLET | ORAL | 2 refills | Status: DC

## 2023-08-08 MED ORDER — CARVEDILOL 6.25 MG PO TABS
6.2500 mg | ORAL_TABLET | Freq: Two times a day (BID) | ORAL | 3 refills | Status: DC
Start: 2023-08-08 — End: 2024-01-02

## 2023-08-08 NOTE — Assessment & Plan Note (Signed)
Patient following up on her PAD.  Continues to be left worse than right, and notes improvement in her right-sided PAD.  ABIs were normal in this patient, but toe brachial index was decreased.  She has not been taking her baby aspirin or Lipitor.  LDL above goal for secondary prevention prescription (>70).  She endorses cold feeling in her right foot as well as symptoms of claudication such as cramping and pain with exertion.  She has been walking as recommended.  She does have financial concerns in regards to filling her prescriptions. Plan: Resume Lipitor, baby aspirin Continue walking

## 2023-08-08 NOTE — Assessment & Plan Note (Addendum)
Patient endorses depression which she attributes to recent loss of loved ones and family.  Difficulty sleeping, appetite a little better recently.  PHQ-9 screening positive. Denies SI, HI, or plan. Some thoughts of self harm but uses prayer to cope with this. She has not been taking her Trazodone recently.  Plan: Resume Trazodone Will consider initiation of Wellbutrin at next visit as it may help with smoking cessation Some signs of manic episodes in the past. Per chart review some history of bipolar, but may have been in the setting of substance use.  Will require dedicated follow up for her mood IBH Referral.

## 2023-08-08 NOTE — Assessment & Plan Note (Addendum)
Patient endorses 62-year-old left leg neuropathic pain and numbness.  She does have a history of CVA many years ago, but feels these symptoms are new. B12 normal on last visit. She has not been taking her cholesterol medication or ASA with any consistency as far as I can tell. Per chart review, she has known left hemibody weakness and deficient, symptoms may be due to ongoing vascular disease or recrudescence   Plan: Referral neurology

## 2023-08-08 NOTE — Progress Notes (Signed)
Subjective:  CC: Follow-up on leg pain  HPI:  [name] is a 62 y.o. person with a past medical history stated below and presents today for follow-up on leg pain. Please see problem based assessment and plan for additional details.  Past Medical History:  Diagnosis Date   Alcoholism (HCC)    ANEMIA, VITAMIN B12 DEFICIENCY 11/11/2007   Documented low 175 on 09/2007; received 6 yrs of IM therapy; switched to po 05/2013    Anxiety    Breast pain, left 06/02/2021   Cardiac arrest (HCC) 01/16/2013   witnessed, shockable rhythm, initally low EF, no CAD at cath. Drug screen negative.   Chest pain    a. 04/2009 Echo: EF 55-60%, Gr1 DD, Mild MR.   Depression    Duodenitis    secondary to H pylori(treatment completed 05/2006), +/- NSAIDS   Duodenitis    a. 05/2006 2/2 H pylori +/- NSAIDS   DVT (deep venous thrombosis) (HCC)    a. 04/2009 - treated with coumadin x 8 mos.   ETOH abuse    Heart murmur    History of cocaine abuse (HCC)    History of CVA (cerebrovascular accident)    Hypertension    Left arm pain 06/11/2017   PE 05/16/2009   CT angio positive for PE in 07/10, treated with coumadin for 8 months.    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 04/2009 - treated with coumadin x 8 mos.   Rectal bleeding 2007   internal hemmorhoids by colonoscopy in 04/2006, Bx neg for IBD   Rectal bleeding    a. int hemorrhoids by colonoscopy in 04/2006, Bx neg for IBD   Right foot pain 05/05/2020   Seizures (HCC)    Shortness of breath 05/28/2020   Stroke (HCC)    Tobacco abuse    Trochanteric bursitis of left hip 05/22/2016    Current Outpatient Medications on File Prior to Visit  Medication Sig Dispense Refill   Acetaminophen Extra Strength 500 MG CAPS Take 1 capsule by mouth 3 (three) times daily.     benzonatate (TESSALON) 100 MG capsule Take 1 capsule (100 mg total) by mouth 3 (three) times daily as needed for cough. 21 capsule 0   Cholecalciferol 25 MCG (1000 UT) CHEW  Chew 1 tablet (1,000 Units total) by mouth daily. 30 tablet 3   diclofenac Sodium (VOLTAREN) 1 % GEL Apply 2 g topically 4 (four) times daily. 100 g 0   nitroGLYCERIN (NITROSTAT) 0.4 MG SL tablet Place 1 tablet (0.4 mg total) under the tongue every 5 (five) minutes as needed for chest pain. 25 tablet 1   terbinafine (LAMISIL) 250 MG tablet Take 1 tablet (250 mg total) by mouth daily. (Patient not taking: Reported on 05/16/2023) 90 tablet 0   vitamin B-12 (CYANOCOBALAMIN) 1000 MCG tablet Take 1 tablet (1,000 mcg total) by mouth daily. 30 tablet 2   No current facility-administered medications on file prior to visit.    Review of Systems: Please see assessment and plan for pertinent positives and negatives.  Objective:   Vitals:   08/08/23 0839  BP: 131/73  Pulse: 67  Resp: (!) 28  Temp: 98 F (36.7 C)  TempSrc: Oral  SpO2: 100%  Weight: 145 lb 11.2 oz (66.1 kg)  Height: 5\' 4"  (1.626 m)    Physical Exam: Constitutional: No acute distress Cardiovascular: regular rate and rhythm, no m/r/g Pulmonary/Chest: Scattered fine crackles on inspiration  abdominal: soft, non-tender, non-distended  Extremities: No edema of the lower extremities bilaterally.  Onychomycosis of the toes bilaterally, no ulcers or cyanosis of the toes appreciated. Positive Homans sign of the left calf. Tenderness to palpation of the left calf muscles. Neurological: Sensory deficit of the lower leg. Inability to feel microfilament to the level of the ankle on the left leg. Decreased vibration sensation of the left ankle and toes. Loss of proprioception of the left upper and lower extremities. Skin: warm and dry   Assessment & Plan:  PAD (peripheral artery disease) (HCC) Patient following up on her PAD.  Continues to be left worse than right, and notes improvement in her right-sided PAD.  ABIs were normal in this patient, but toe brachial index was decreased.  She has not been taking her baby aspirin or Lipitor.  LDL  above goal for secondary prevention prescription (>70).  She endorses cold feeling in her right foot as well as symptoms of claudication such as cramping and pain with exertion.  She has been walking as recommended.  She does have financial concerns in regards to filling her prescriptions. Plan: Resume Lipitor, baby aspirin Continue walking   Neuropathy Patient endorses 62-year-old left leg neuropathic pain and numbness.  She does have a history of CVA many years ago, but feels these symptoms are new. B12 normal on last visit. She has not been taking her cholesterol medication or ASA with any consistency as far as I can tell. Per chart review, she has known left hemibody weakness and deficient, symptoms may be due to ongoing vascular disease or recrudescence   Plan: Referral neurology    Cough Patient endorses increase cough for about 1 week.  She notes increased yellow sputum production.  Denies chest pain or shortness of breath.  Denies fevers, chills, or night sweats.  She states she recently ran out of her albuterol inhaler which seems to help.  She uses her albuterol inhaler sparingly, mostly after walking.  She did get formal pulmonary function testing which revealed restrictive lung disease.  She was to follow-up with pulmonology, but never established with them. Plan: Refill albuterol at patient request Urged patient to follow with pulmonology, contact information provided   Depression Patient endorses depression which she attributes to recent loss of loved ones and family.  Difficulty sleeping, appetite a little better recently.  PHQ-9 screening positive. Denies SI, HI, or plan. Some thoughts of self harm but uses prayer to cope with this. She has not been taking her Trazodone recently.  Plan: Resume Trazodone Will consider initiation of Wellbutrin at next visit as it may help with smoking cessation Some signs of manic episodes in the past. Per chart review some history of bipolar,  but may have been in the setting of substance use.  Will require dedicated follow up for her mood IBH Referral.     Patient seen with Dr. Willow Ora MD [facility]  PGY-1 Pager: [phone]  Phone: [phone] Date 08/08/2023  Time 1:34 PM

## 2023-08-08 NOTE — Assessment & Plan Note (Addendum)
Patient endorses increase cough for about 1 week.  She notes increased yellow sputum production.  Denies chest pain or shortness of breath.  Denies fevers, chills, or night sweats.  She states she recently ran out of her albuterol inhaler which seems to help.  She uses her albuterol inhaler sparingly, mostly after walking.  She did get formal pulmonary function testing which revealed restrictive lung disease.  She was to follow-up with pulmonology, but never established with them. Plan: Refill albuterol at patient request Urged patient to follow with pulmonology, contact information provided

## 2023-08-08 NOTE — Patient Instructions (Addendum)
Thank you, [name] for allowing [place] to provide your care today.  Referrals ordered today:   Referral Orders         Ambulatory referral to Neurology         Ambulatory referral to Integrated Behavioral Health      Please Call:  [facility] [place]   Located in: [facility] Address: [address], [place], [place] [address]   Phone: [phone]  I have ordered the following medication/changed the following medications:   Stop the following medications: Medications Discontinued During This Encounter  Medication Reason   aspirin EC 81 MG tablet    traZODone (DESYREL) 50 MG tablet Reorder   gabapentin (NEURONTIN) 300 MG capsule Reorder   albuterol (VENTOLIN HFA) 108 (90 Base) MCG/ACT inhaler Reorder   carvedilol (COREG) 6.25 MG tablet Reorder   atorvastatin (LIPITOR) 40 MG tablet Reorder   albuterol (VENTOLIN HFA) 108 (90 Base) MCG/ACT inhaler    buPROPion (WELLBUTRIN XL) 150 MG 24 hr tablet      Start the following medications: Meds ordered this encounter  Medications   traZODone (DESYREL) 50 MG tablet    Sig: Take 1 tablet (50 mg total) by mouth at bedtime as needed for sleep.    Dispense:  30 tablet    Refill:  0   gabapentin (NEURONTIN) 300 MG capsule    Sig: Take 1 capsule (300 mg total) by mouth 3 (three) times daily.    Dispense:  270 capsule    Refill:  3    IM Program   carvedilol (COREG) 6.25 MG tablet    Sig: Take 1 tablet (6.25 mg total) by mouth 2 (two) times daily.    Dispense:  180 tablet    Refill:  3    IM Program   atorvastatin (LIPITOR) 40 MG tablet    Sig: Take 1 tablet (40 mg total) by mouth daily.    Dispense:  90 tablet    Refill:  3    IM Program   DISCONTD: albuterol (VENTOLIN HFA) 108 (90 Base) MCG/ACT inhaler    Sig: Inhale 2 puffs into the lungs every 4 (four) hours as needed for wheezing or shortness of breath.    Dispense:  6.7 g     Refill:  0   aspirin EC 81 MG tablet    Sig: Take 1 tablet (81 mg total) by mouth daily. Swallow whole.    Dispense:  150 tablet    Refill:  2   DISCONTD: buPROPion (WELLBUTRIN XL) 150 MG 24 hr tablet    Sig: Take 1 tablet (150 mg total) by mouth every morning.    Dispense:  30 tablet    Refill:  2   albuterol (VENTOLIN HFA) 108 (90 Base) MCG/ACT inhaler    Sig: Inhale 2 puffs into the lungs every 4 (four) hours as needed for wheezing or shortness of breath.    Dispense:  6.7 g    Refill:  0       Follow up:  2 weeks  for check in regarding mood and symptoms    We look forward to seeing you next time. Please call our clinic at [phone] if you have any questions or concerns. The best time to call is Monday-Friday from 9am-4pm, but there is someone available 24/7. If after hours or the weekend, call the main hospital number and ask for the Internal Medicine Resident  On-Call. If you need medication refills, please notify your pharmacy one week in advance and they will send [place] a request.   Thank you for trusting me with your care. Wishing you the best!  Lovie Macadamia MD [facility]

## 2023-08-17 NOTE — Progress Notes (Signed)
Internal Medicine Clinic Attending  I was physically present during the key portions of the resident provided service and participated in the medical decision making of patient's management care. I reviewed pertinent patient test results.  The assessment, diagnosis, and plan were formulated together and I agree with the documentation in the resident's note.  Williams, Julie Anne, MD  

## 2023-08-28 ENCOUNTER — Other Ambulatory Visit: Payer: Self-pay

## 2023-08-28 DIAGNOSIS — R053 Chronic cough: Secondary | ICD-10-CM

## 2023-08-28 MED ORDER — ALBUTEROL SULFATE HFA 108 (90 BASE) MCG/ACT IN AERS: 2.0000 | INHALATION_SPRAY | RESPIRATORY_TRACT | 0 refills | Status: DC | PRN

## 2023-08-31 ENCOUNTER — Encounter: Payer: Self-pay | Admitting: Student

## 2023-08-31 ENCOUNTER — Ambulatory Visit (INDEPENDENT_AMBULATORY_CARE_PROVIDER_SITE_OTHER): Payer: MEDICAID | Admitting: Student

## 2023-08-31 VITALS — BP 147/67 | HR 71 | Temp 98.2°F | Wt 144.6 lb

## 2023-08-31 DIAGNOSIS — I6930 Unspecified sequelae of cerebral infarction: Secondary | ICD-10-CM

## 2023-08-31 DIAGNOSIS — E559 Vitamin D deficiency, unspecified: Secondary | ICD-10-CM | POA: Diagnosis not present

## 2023-08-31 DIAGNOSIS — Z0000 Encounter for general adult medical examination without abnormal findings: Secondary | ICD-10-CM

## 2023-08-31 DIAGNOSIS — Z720 Tobacco use: Secondary | ICD-10-CM

## 2023-08-31 DIAGNOSIS — R079 Chest pain, unspecified: Secondary | ICD-10-CM | POA: Diagnosis not present

## 2023-08-31 DIAGNOSIS — I69398 Other sequelae of cerebral infarction: Secondary | ICD-10-CM

## 2023-08-31 DIAGNOSIS — I739 Peripheral vascular disease, unspecified: Secondary | ICD-10-CM

## 2023-08-31 DIAGNOSIS — F17210 Nicotine dependence, cigarettes, uncomplicated: Secondary | ICD-10-CM | POA: Diagnosis not present

## 2023-08-31 DIAGNOSIS — I428 Other cardiomyopathies: Secondary | ICD-10-CM

## 2023-08-31 DIAGNOSIS — I5020 Unspecified systolic (congestive) heart failure: Secondary | ICD-10-CM

## 2023-08-31 MED ORDER — ATORVASTATIN CALCIUM 40 MG PO TABS
40.0000 mg | ORAL_TABLET | Freq: Every day | ORAL | 3 refills | Status: DC
Start: 2023-08-31 — End: 2024-01-02

## 2023-08-31 MED ORDER — CHOLECALCIFEROL 25 MCG (1000 UT) PO CHEW
1.0000 | CHEWABLE_TABLET | Freq: Every day | ORAL | 0 refills | Status: DC
Start: 2023-08-31 — End: 2024-09-23

## 2023-08-31 NOTE — Assessment & Plan Note (Signed)
Patient continues to smoke 1 to 2 cigarettes/day.  She had a CT chest high-resolution on 05/08/2023 that was ordered but never scheduled. - Follow-up CT chest high-resolution

## 2023-08-31 NOTE — Assessment & Plan Note (Signed)
Patient noted that she has not been taking her Lipitor for secondary prevention in the context of a previous CVA.  Last LDL was 88.  Hold off on getting a lipid panel today as patient has not been adherent to her medication. - Resume Lipitor 40 mg daily - Follow-up lipid panel next time (6-8 weeks)

## 2023-08-31 NOTE — Progress Notes (Signed)
CC: Routine follow-up  HPI: [name] is a 62 y.o. female living with a history stated below and presents today for routine follow-up. Please see problem based assessment and plan for additional details.  Past Medical History:  Diagnosis Date   Alcoholism (HCC)    ANEMIA, VITAMIN B12 DEFICIENCY 11/11/2007   Documented low 175 on 09/2007; received 6 yrs of IM therapy; switched to po 05/2013    Anxiety    Breast pain, left 06/02/2021   Cardiac arrest (HCC) 01/16/2013   witnessed, shockable rhythm, initally low EF, no CAD at cath. Drug screen negative.   Chest pain    a. 04/2009 Echo: EF 55-60%, Gr1 DD, Mild MR.   Depression    Duodenitis    secondary to H pylori(treatment completed 05/2006), +/- NSAIDS   Duodenitis    a. 05/2006 2/2 H pylori +/- NSAIDS   DVT (deep venous thrombosis) (HCC)    a. 04/2009 - treated with coumadin x 8 mos.   ETOH abuse    Heart murmur    History of cocaine abuse (HCC)    History of CVA (cerebrovascular accident)    Hypertension    Left arm pain 06/11/2017   PE 05/16/2009   CT angio positive for PE in 07/10, treated with coumadin for 8 months.    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 04/2009 - treated with coumadin x 8 mos.   Rectal bleeding 2007   internal hemmorhoids by colonoscopy in 04/2006, Bx neg for IBD   Rectal bleeding    a. int hemorrhoids by colonoscopy in 04/2006, Bx neg for IBD   Right foot pain 05/05/2020   Seizures (HCC)    Shortness of breath 05/28/2020   Stroke (HCC)    Tobacco abuse    Trochanteric bursitis of left hip 05/22/2016    Current Outpatient Medications on File Prior to Visit  Medication Sig Dispense Refill   Acetaminophen Extra Strength 500 MG CAPS Take 1 capsule by mouth 3 (three) times daily.     albuterol (VENTOLIN HFA) 108 (90 Base) MCG/ACT inhaler Inhale 2 puffs into the lungs every 4 (four) hours as needed for wheezing or shortness of breath. 6.7 g 0   aspirin EC 81 MG tablet Take 1 tablet (81 mg  total) by mouth daily. Swallow whole. 150 tablet 2   benzonatate (TESSALON) 100 MG capsule Take 1 capsule (100 mg total) by mouth 3 (three) times daily as needed for cough. 21 capsule 0   carvedilol (COREG) 6.25 MG tablet Take 1 tablet (6.25 mg total) by mouth 2 (two) times daily. 180 tablet 3   diclofenac Sodium (VOLTAREN) 1 % GEL Apply 2 g topically 4 (four) times daily. 100 g 0   gabapentin (NEURONTIN) 300 MG capsule Take 1 capsule (300 mg total) by mouth 3 (three) times daily. 270 capsule 3   nitroGLYCERIN (NITROSTAT) 0.4 MG SL tablet Place 1 tablet (0.4 mg total) under the tongue every 5 (five) minutes as needed for chest pain. 25 tablet 1   terbinafine (LAMISIL) 250 MG tablet Take 1 tablet (250 mg total) by mouth daily. (Patient not taking: Reported on 05/16/2023) 90 tablet 0   traZODone (DESYREL) 50 MG tablet Take 1 tablet (50 mg total) by mouth at bedtime as needed for sleep. 30 tablet 0   vitamin B-12 (CYANOCOBALAMIN) 1000 MCG tablet Take 1 tablet (1,000 mcg total) by mouth daily. 30 tablet 2   No current facility-administered medications on file  prior to visit.    Family History  Problem Relation Age of Onset   Cancer Mother    Breast cancer Mother 6   Heart disease Mother    Diabetes Mother    CAD Mother    Heart attack Mother    Hypertension Mother    Cancer Father    Heart disease Father    Colon cancer Father    CAD Father    Cancer Sister    Breast cancer Sister 55   Diabetes Sister    Hypertension Sister    Heart disease Brother    Diabetes Brother    CAD Brother    Breast cancer Niece 54   Coronary artery disease Other        1 st degree female relative <60 and female <50   Diabetes type II Other        1 st degree relative   Colon polyps Neg Hx    Esophageal cancer Neg Hx    Rectal cancer Neg Hx    Stomach cancer Neg Hx     Social History   Socioeconomic History   Marital status: Single    Spouse name: Not on file   Number of children: 2   Years of  education: Not on file   Highest education level: 11th grade  Occupational History   Occupation: Unemployed  Tobacco Use   Smoking status: Some Days    Current packs/day: 0.10    Types: Cigarettes   Smokeless tobacco: Never   Tobacco comments:    smokes about 1-2 cigs/day.  Has patches   Vaping Use   Vaping status: Never Used  Substance and Sexual Activity   Alcohol use: Yes    Comment: beer   Drug use: Yes    Types: Marijuana   Sexual activity: Yes    Partners: Male    Birth control/protection: Post-menopausal  Other Topics Concern   Not on file  Social History Narrative   ** Merged History Encounter **       ** Data from: 12/05/12 Enc Dept: [facility]   No cocaine or alcohol use since hospital discharge 05/19/2009.      10/13 update:  States she is smoking 1-2 cigarettes daily with at least 1 drink daily usually Brandy.                  ** Data from: 01/17/13 Enc Dept: [facility]   Pt lives in [place] with a roommate.      Social Determinants of Health   Financial Resource Strain: Low Risk  (03/14/2023)   Overall Financial Resource Strain (CARDIA)    Difficulty of Paying Living Expenses: Not hard at all  Food Insecurity: No Food Insecurity (03/14/2023)   Hunger Vital Sign    Worried About Running Out of Food in the Last Year: Never true    Ran Out of Food in the Last Year: Never true  Transportation Needs: Unmet Transportation Needs (03/14/2023)   PRAPARE - Transportation    Lack of Transportation (Medical): Yes    Lack of Transportation (Non-Medical): Yes  Physical Activity: Sufficiently Active (03/14/2023)   Exercise Vital Sign    Days of Exercise per Week: 7 days    Minutes of Exercise per Session: 30 min  Stress: No Stress Concern Present (03/14/2023)   [employer] of Occupational Health - Occupational Stress Questionnaire    Feeling of Stress : Not at all  Social Connections: Moderately Isolated (03/14/2023)  Social Oncologist [NHANES]    Frequency of Communication with Friends and Family: More than three times a week    Frequency of Social Gatherings with Friends and Family: Once a week    Attends Religious Services: More than 4 times per year    Active Member of [employer] or Organizations: No    Attends Banker Meetings: Never    Marital Status: Never married  Intimate Partner Violence: Not At Risk (03/14/2023)   Humiliation, Afraid, Rape, and Kick questionnaire    Fear of Current or Ex-Partner: No    Emotionally Abused: No    Physically Abused: No    Sexually Abused: No    Review of Systems: ROS negative except for what is noted on the assessment and plan.  Vitals:   08/31/23 1029 08/31/23 1137  BP: (!) 144/69 (!) 147/67  Pulse: 75 71  Temp: 98.2 F (36.8 C)   TempSrc: Oral   SpO2: 100%   Weight: 144 lb 9.6 oz (65.6 kg)     Physical Exam: Constitutional: well-appearing in no acute distress HENT: normocephalic atraumatic, mucous membranes moist Eyes: conjunctiva non-erythematous Neck: supple Cardiovascular: regular rate and rhythm, no m/r/g Pulmonary/Chest: normal work of breathing on room air, lungs clear to auscultation bilaterally Abdominal: soft, non-tender, non-distended MSK: normal bulk and tone Neurological: alert & oriented x 3, 5/5 strength in bilateral upper and lower extremities, normal gait Skin: warm and dry  Assessment & Plan:   Health care maintenance Patient continues to smoke 1 to 2 cigarettes/day.  She had a CT chest high-resolution on 05/08/2023 that was ordered but never scheduled. - Follow-up CT chest high-resolution  History of CVA with residual deficit Patient noted that she has not been taking her Lipitor for secondary prevention in the context of a previous CVA.  Last LDL was 88.  Hold off on getting a lipid panel today as patient has not been adherent to her medication. - Resume Lipitor 40 mg daily - Follow-up lipid panel next time (6-8  weeks)   Vitamin D deficiency At her last visit 2 months ago, patient had a vitamin D level of 17.2.  She notes that she has not started any supplementation.  Will start 1000 units daily of vitamin D for 3 months. - Begin vitamin D 800 units daily for 3 months - Follow-up vitamin D at next visit  HFrEF (heart failure with reduced ejection fraction) (HCC) Echo from 05/07/2023 noted improvement in EF at 55 to 60%.  Managed with Coreg 6.25 mg twice daily. Patient has been adherent to her medications. Did not take this morning which may be contributing to patient's blood pressure of 144/69 today.  At her last visit, her blood pressure was 127/65, which is consistent with what she measures at home.  She feels like her breathing is worsening but improves with inhaler temporarily.  Unclear if etiology is HFrEF versus COPD versus asthma versus angina.  Scheduled to follow-up with pulm on 12/23 to rule in/out pulmonary etiology. - Continue Coreg 6.25 mg twice daily  Tobacco use Patient continues to smoke 1 to 2 cigarettes/day.  She notes that this has increased recently due to stressors.  Nicotine patches have been successful and she continues to use these.  She has tried Chantix in the past but was intolerant due to nightmares. - Continue nicotine patches  Chest pain Patient has tightness in her chest when walking, particular uphill.  She denies any lightheadedness or syncope.  She notes  that these symptoms improve with rest.  She believes that these have been worsening recently.  There is concern that she may be experiencing stable angina, so we will send a referral for possible stress testing with cardiology.  Furthermore, patient has a history of cardiac arrest in 2014 in which no CAD was identified.  Patient also has a history of CVA.  Due to patient's history of left bundle branch block, pharmacological stress testing will be likely.  Patient was counseled on going to the ED if she develops chest pain out  of nowhere or chest pain that does not go away. - Follow-up cardiology referral for stress testing  Patient seen with Dr. Mariea Stable, MD  [facility] Internal Medicine, PGY-1 Date 08/31/2023 Time 4:21 PM

## 2023-08-31 NOTE — Assessment & Plan Note (Signed)
Echo from 05/07/2023 noted improvement in EF at 55 to 60%.  Managed with Coreg 6.25 mg twice daily. Patient has been adherent to her medications. Did not take this morning which may be contributing to patient's blood pressure of 144/69 today.  At her last visit, her blood pressure was 127/65, which is consistent with what she measures at home.  She feels like her breathing is worsening but improves with inhaler temporarily.  Unclear if etiology is HFrEF versus COPD versus asthma versus angina.  Scheduled to follow-up with pulm on 12/23 to rule in/out pulmonary etiology. - Continue Coreg 6.25 mg twice daily

## 2023-08-31 NOTE — Patient Instructions (Signed)
Thank you so much for coming to the clinic today!   For your chest pain, I have sent in a referral to cardiology.  If you experience chest pain that comes out of nowhere and/or does not go away, please go to the ED.  I have also put in prescriptions for your vitamin D and Lipitor.   If you have any questions please feel free to the call the clinic at anytime at [phone]. It was a pleasure seeing you!  Best, Dr. Rayvon Char

## 2023-08-31 NOTE — Assessment & Plan Note (Signed)
At her last visit 2 months ago, patient had a vitamin D level of 17.2.  She notes that she has not started any supplementation.  Will start 1000 units daily of vitamin D for 3 months. - Begin vitamin D 800 units daily for 3 months - Follow-up vitamin D at next visit

## 2023-08-31 NOTE — Assessment & Plan Note (Signed)
Patient continues to smoke 1 to 2 cigarettes/day.  She notes that this has increased recently due to stressors.  Nicotine patches have been successful and she continues to use these.  She has tried Chantix in the past but was intolerant due to nightmares. - Continue nicotine patches

## 2023-08-31 NOTE — Assessment & Plan Note (Signed)
Patient has tightness in her chest when walking, particular uphill.  She denies any lightheadedness or syncope.  She notes that these symptoms improve with rest.  She believes that these have been worsening recently.  There is concern that she may be experiencing stable angina, so we will send a referral for possible stress testing with cardiology.  Furthermore, patient has a history of cardiac arrest in 2014 in which no CAD was identified.  Patient also has a history of CVA.  Due to patient's history of left bundle branch block, pharmacological stress testing will be likely.  Patient was counseled on going to the ED if she develops chest pain out of nowhere or chest pain that does not go away. - Follow-up cardiology referral for stress testing

## 2023-09-03 NOTE — Addendum Note (Signed)
Addended by: Dickie La on: 09/03/2023 12:49 PM   Modules accepted: Level of Service

## 2023-09-03 NOTE — Progress Notes (Addendum)
Internal Medicine Clinic Attending  I was physically present during the key portions of the resident provided service and participated in the medical decision making of patient's management care. I reviewed pertinent patient test results.  The assessment, diagnosis, and plan were formulated together and I agree with the documentation in the resident's note. Asymptomatic during today's visit. ED precautions given for persistent chest pain or SOB. Referral to cardiology for possible stress testing. Will also need to f/u with pulmonology and schedule CT chest as previously ordered.  Dickie La, MD

## 2023-09-17 ENCOUNTER — Ambulatory Visit (INDEPENDENT_AMBULATORY_CARE_PROVIDER_SITE_OTHER): Payer: 59 | Admitting: Licensed Clinical Social Worker

## 2023-09-17 DIAGNOSIS — F32A Depression, unspecified: Secondary | ICD-10-CM

## 2023-09-18 ENCOUNTER — Ambulatory Visit (INDEPENDENT_AMBULATORY_CARE_PROVIDER_SITE_OTHER): Payer: 59 | Admitting: Licensed Clinical Social Worker

## 2023-09-18 DIAGNOSIS — F32A Depression, unspecified: Secondary | ICD-10-CM

## 2023-09-18 NOTE — BH Specialist Note (Signed)
[facility] contacted and spoke with patient via phone due to patient no showing appointment. Patient requested to reschedule for 11/19 at 9am.   Christen Butter, MSW, LCSW-A She/Her Behavioral Health Clinician [facility]  Internal Medicine Center Direct Dial:[phone]  Fax [phone] Main Office Phone: [phone] [address] [place] [place], [place] [address] Website: [facility] Internal Medicine [facility]  [place], [place]  [place]

## 2023-09-18 NOTE — BH Specialist Note (Signed)
Integrated Behavioral Health via Telemedicine Visit  09/18/2023 [name] [id number]  Number of Integrated Behavioral Health Clinician visits: 1- Initial Visit  Session Start time: 0900   Session End time: 1000  Total time in minutes: 60   Referring Provider: PCP Patient/Family location: Home [facility] Provider location: Office All persons participating in visit: [facility] and Patient Types of Service: Introduction only  I connected with [name] via  Telephone  and verified that I am speaking with the correct person using two identifiers. Discussed confidentiality: Yes   I discussed the limitations of telemedicine and the availability of in person appointments.  Discussed there is a possibility of technology failure and discussed alternative modes of communication if that failure occurs.  I discussed that engaging in this telemedicine visit, they consent to the provision of behavioral healthcare and the services will be billed under their insurance.  Patient and/or legal guardian expressed understanding and consented to Telemedicine visit: Yes   Presenting Concerns: Patient and/or family reports the following symptoms/concerns: The [facility] initiated the session by introducing themselves and elucidating their role in the patient's care. Following this, the [facility] provided a comprehensive overview of telehealth services, to which the patient expressed understanding and agreement. The consultation proceeded with the patient articulating his symptoms and outlining his goals for addressing her anxiety and depression. The [facility] actively listened and engaged in a preliminary discussion about these concerns. To ensure continuity of care, the [facility] scheduled a follow-up appointment for October 04, 2023, at 9:45 AM, allowing for a more in-depth exploration of the patient's mental health needs and the development of a tailored treatment plan.  Patient and/or Family's Strengths/Protective  Factors: Social connections  Goals Addressed: Patient will:  Reduce symptoms of: depression   Increase knowledge and/or ability of: coping skills   Demonstrate ability to: Increase healthy adjustment to current life circumstances  Progress towards Goals: Ongoing  Interventions: Interventions utilized:  CBT Cognitive Behavioral Therapy Standardized Assessments completed: PHQ-SADS  Patient and/or Family Response: Patient agrees to services and prefers in person.  Assessment: Patient currently experiencing Depression.   Patient may benefit from Individual therapy.  Plan: Follow up with behavioral health clinician on : 12/05   I discussed the assessment and treatment plan with the patient and/or parent/guardian. They were provided an opportunity to ask questions and all were answered. They agreed with the plan and demonstrated an understanding of the instructions.   They were advised to call back or seek an in-person evaluation if the symptoms worsen or if the condition fails to improve as anticipated.  Christen Butter, MSW, LCSW-A She/Her Behavioral Health Clinician [facility]  Internal Medicine Center Direct Dial:[phone]  Fax [phone] Main Office Phone: [phone] [address] [place] [place], [place] [address] Website: [facility] Internal Medicine [facility]  [place], [place]  [place]

## 2023-10-04 ENCOUNTER — Ambulatory Visit (HOSPITAL_COMMUNITY)
Admission: EM | Admit: 2023-10-04 | Discharge: 2023-10-04 | Disposition: A | Discharge status: Home / Self Care | Payer: 59

## 2023-10-04 ENCOUNTER — Ambulatory Visit (INDEPENDENT_AMBULATORY_CARE_PROVIDER_SITE_OTHER): Payer: MEDICAID | Admitting: Licensed Clinical Social Worker

## 2023-10-04 DIAGNOSIS — F4321 Adjustment disorder with depressed mood: Secondary | ICD-10-CM | POA: Diagnosis not present

## 2023-10-04 DIAGNOSIS — F1590 Other stimulant use, unspecified, uncomplicated: Secondary | ICD-10-CM

## 2023-10-04 DIAGNOSIS — F1414 Cocaine abuse with cocaine-induced mood disorder: Secondary | ICD-10-CM | POA: Diagnosis not present

## 2023-10-04 DIAGNOSIS — F1290 Cannabis use, unspecified, uncomplicated: Secondary | ICD-10-CM

## 2023-10-04 DIAGNOSIS — F1090 Alcohol use, unspecified, uncomplicated: Secondary | ICD-10-CM

## 2023-10-04 NOTE — BH Specialist Note (Signed)
Integrated Behavioral Health Follow Up In-Person Visit  MRN: [id number] Name: [name]  Number of Integrated Behavioral Health Clinician visits: 1- Initial Visit  Session Start time: 0945   Session End time: 1030  Total time in minutes: 45   Types of Service: Individual psychotherapy    Subjective: [name] is a 62 y.o. female  Patient was referred by PCP  Patient reports the following symptoms/concerns: Patient,  presented with recent cocaine usage and binge behavior, reporting severe depression, frustration, and anxiety following the loss of a best friend to cancer and the impending loss of another friend to the same illness. Patient expressed feeling "out of control" and sought help. Assessment revealed active cocaine use disorder, major depressive disorder (severe), generalized anxiety disorder, and complicated grief. Mental status examination showed depressed mood with congruent affect, linear thought process, no current suicidal or homicidal ideation, and impaired judgment due to substance use. Treatment options, including rehab/detox inpatient and Substance Abuse Intensive Outpatient Program [facility]), were discussed. Patient mentioned positive past experiences with [facility] for substance abuse treatment. Two bus passes were provided to facilitate transportation to [facility], with the patient planning to take the 11:04 PM bus for immediate substance abuse treatment. Recommendations include a comprehensive assessment at [facility] to determine appropriate level of care (SAIOP vs. inpatient), addressing both substance use disorder and co-occurring mental health issues, and considering grief counseling.  The patient is experiencing significant distress in her current living situation with her daughter's family. She reports that the family's use of marijuana and alcohol is creating an unhealthy environment for her, indicating potential substance abuse issues within the household. Of  particular concern is the patient's extreme frustration with her 66-year-old granddaughter. While the patient explicitly denies any thoughts of harming the child, her statement that she knows her daughter would call the police if "something happens" suggests an underlying tension and potential for conflict escalation. This situation highlights a complex interplay of intergenerational conflicts, substance use issues, and challenging family dynamics. The patient's awareness of potential legal consequences if the situation worsens indicates a need for immediate intervention to address family communication, establish clear boundaries, and potentially explore alternative living arrangements. It is crucial to develop a comprehensive plan that ensures the safety and well-being of all family members, particularly the young granddaughter, while addressing the patient's frustrations and the family's substance use concerns.  Objective: Mood: Depressed and Affect: Tearful Risk of harm to self or others: No plan to harm self or others    Patient and/or Family's Strengths/Protective Factors: Sense of purpose  Goals Addressed: Patient will:  Reduce symptoms of: depression and Substance Abuse    Increase knowledge and/or ability of: coping skills   Demonstrate ability to: Increase healthy adjustment to current life circumstances  Progress towards Goals: Ongoing  Interventions: Interventions utilized:  Link to [employer]   Patient and/or Family Response: Patient agreed to self admitting into treatment   Christen Butter, MSW, LCSW-A She/Her Behavioral Health Clinician [facility]  Internal Medicine Center Direct Dial:[phone]  Fax [phone] Main Office Phone: [phone] [address] [place] [place], [place] [address] Website: [facility] Internal Medicine [facility]  [place], [place]  [facility]

## 2023-10-04 NOTE — Progress Notes (Signed)
   10/04/23 1124  BHUC Triage Screening (Walk-ins at [facility] only)  How Did You Hear About [place]? Self  What Is the Reason for Your Visit/Call Today? Pt presents to [facility] voluntarily unaccompanied. Pt states that she was at [facility] talking to Christen Butter and she advised her to come into detox and grief counseling. Pt denies SI, HI, and AVH at this present time. Pt admits to having a marijuana joint and a shot of vodka last night. Pt states that she knows she needs to talk to someone about grief and she needs to get off drugs because they are messing with her health.  How Long Has This Been Causing You Problems? <Week  Have You Recently Had Any Thoughts About Hurting Yourself? No  Are You Planning to Commit Suicide/Harm Yourself At This time? No  Have you Recently Had Thoughts About Hurting Someone Karolee Ohs? No  Are You Planning To Harm Someone At This Time? No  Physical Abuse Denies  Verbal Abuse Denies  Sexual Abuse Denies  Exploitation of patient/patient's resources Denies  Self-Neglect Denies  Are you currently experiencing any auditory, visual or other hallucinations? No  Have You Used Any Alcohol or Drugs in the Past 24 Hours? Yes  How long ago did you use Drugs or Alcohol? last night  What Did You Use and How Much? marijuana - a joint & vodka - a shot  Do you have any current medical co-morbidities that require immediate attention? No  Clinician description of patient physical appearance/behavior: neatly dressed, calm, cooperative  What Do You Feel Would Help You the Most Today? Social Support;Alcohol or Drug Use Treatment;Treatment for Depression or other mood problem;Medication(s)  If access to [facility] Urgent Care was not available, would you have sought care in the Emergency Department? No  Determination of Need Routine (7 days)  Options For Referral Outpatient Therapy;Intensive Outpatient Therapy;Medication Management;Chemical Dependency Intensive Outpatient Therapy (CDIOP);Facility-Based  Crisis

## 2023-10-04 NOTE — ED Provider Notes (Signed)
Behavioral Health Urgent Care Medical Screening Exam  Patient Name: [name] MRN: [id number] Date of Evaluation: 10/04/23 Chief Complaint:   Diagnosis:  Final diagnoses:  Grief  Cannabis use disorder  Stimulant use disorder  Alcohol use disorder  Adjustment disorder with depressed mood    History of Present illness: [name] is a 62 y.o. female. With prior documented hx of bipolar disorder, depression, and reporting polysubstance abuse (alcohol, tobacco, cocaine, and cannabis).   The patient reports experiencing grief following the death of her best friend two days ago. She feels she is not coping well and has been using drugs to manage her emotions. Symptoms include fatigue, worsening depression, and increased irritability. She denies active suicidal ideation or intent but acknowledges passive suicidal ideation. She is interested in grief counseling and denies access to firearms.  The patient has a history of substance use, including cannabis, alcohol, cocaine, and tobacco, with her longest period of sobriety being three years during long-term rehabilitation. She recognizes the negative influence of her social circle on her drug use.  She has interests in following starting CD-IOP for her substance use. Grief counseling services were also provided.   Patient declined interest in going to [facility] at this time for detox. She does report she has successfully maintained sobriety in the past when she was in residential rehabilitation.   Safety planning was thoroughly discussed and included strategies for managing passive suicidal ideation, such as identifying and engaging with support systems (e.g., family, friends, or counseling services), removing access to potential means of self-harm, and developing healthy coping mechanisms to address emotional distress. The patient was provided with crisis hotline information and advised to seek emergency care if suicidal thoughts intensify.  She expressed understanding and agreement with the plan.  Psychiatric ROS Mood Symptoms Depressed, fatigue, difficult focusing,anhedonia, passive SI (denies plan or intent), poor sleep (difficulty staying alseep) diminished appetite.   Manic Symptoms Reports hx of expansive energy and mood that has occurred int he past, concurrent with stimulant use. Denies any x of manic episodes when she has been sober.   Trauma Symptoms Reports she had hx of this "but that is in the past". Reports infrequent fladhbacks and nightmares. Declined to provide further details.   Psychosis Symptoms Denies any   Substance Use Hx: Alcohol: reports hx of alcohol abuse. Reports she has been drinking daily (2 16 oz beers) for the past 2-3 months. Last drink 24 hours ago, reports she consumed half a pint of vodka (reports she takes shots of this). Reports hx of withdrawal related seizures, last year. Denies any history fo Dts.  Tobacco: current smoker, smoke maximum 2 cigarettes daily for the over 45 years. She has been working on cutting down since it worsened her bronchitis.  Cannabis:First use at age 63. Currently reports smoking1 blunt daily. Last sue yesterday Cocaine: Smokes it. First use in her late 35s. Last use yesterday. Reports she usually uses on the weekend, has increased use for the past 2 days   Denies any hx of IVDU.  Rehab hx: has been to [facility] and [facility] int he past.   Past Psychiatric Hx: Previous Psychiatric Diagnoses:  prior dx of bipolar and depression on chart review Current psychiatric medications: denies Psychiatric medication history/compliance: seroquel and lithium in the past. Had 1 prior hospitalization in [place] about 20 years ago for suicide attempt History of suicide (obtained from HPI):3- 4 attempts in her lifetime. Denies any recent, reports it has been over 15 years  ago.  NSSIB hx: Denies any current or past.   Past Medical History: Medical Dx: arthritis, pinched nerve,  CHF,chronic bronchitis Medications:Allergies: omnipaque dye and contrast Seizures: hx of alcohol withdrawal seizures. Has regular follow up with op neurologist. Takes no medications  Social History: Living Situation:Lives in [place] with daughter and Information systems manager. Social Support: reports she has no significant social support Occupational hx: SSI for bipolar d/o Marital Status: Single Children: has 2 children, has daughter and a son that also lives close Legal: denies any upcoming court dates or prior legal charges Military: denies  Access to firearms: Denies    Flowsheet Row ED from 10/04/2023 in [facility] ED from 03/06/2023 in [facility] at [facility] ED from 11/06/2022 in [facility] at [facility] RISK CATEGORY No Risk No Risk No Risk       Psychiatric Specialty Exam  Presentation  General Appearance:Appropriate for Environment; Casual  Eye Contact:Good  Speech:Clear and Coherent; Normal Rate  Speech Volume:Normal  Handedness:-- (not assessed)   Mood and Affect  Mood:-- ("Heart-broken")  Affect:Depressed; Constricted   Thought Process  Thought Processes:Linear; Coherent; Goal Directed  Descriptions of Associations:Intact  Orientation:No data recorded Thought Content:Logical Diagnosis of Schizophrenia or Schizoaffective disorder in past: No Hallucinations:None  Ideas of Reference:None  Suicidal Thoughts:No  Homicidal Thoughts:No   Sensorium  Memory:Immediate Fair; Recent Fair; Remote Fair  Judgment:Fair  Insight:Fair   Executive Functions  Concentration:Fair  Attention Span:Fair  Recall:Fair  Fund of Knowledge:Fair  Language:Fair   Psychomotor Activity  Psychomotor Activity:Normal   Assets  Assets:Communication Skills; Desire for Improvement   Sleep  Sleep:Poor    Physical Exam: Physical Exam Vitals and nursing note reviewed.  HENT:     Head:  Normocephalic and atraumatic.  Pulmonary:     Effort: Pulmonary effort is normal. No respiratory distress.  Skin:    General: Skin is warm and dry.    Review of Systems  Respiratory: Negative.    Cardiovascular: Negative.   Gastrointestinal: Negative.   Genitourinary: Negative.    Blood pressure 119/89, pulse 62, temperature 98.5 F (36.9 C), temperature source Oral, resp. rate 16, last menstrual period 11/02/2010, SpO2 100%. There is no height or weight on file to calculate BMI.  Musculoskeletal: Strength & Muscle Tone: within normal limits Gait & Station: normal Patient leans: N/A   BHUC MSE Discharge Disposition for Follow up and Recommendations: Based on my evaluation the patient does not appear to have an emergency medical condition and can be discharged with resources and follow up care in outpatient services for Substance Abuse Intensive Outpatient Program and Grief counseling.    Lorri Frederick, MD 10/04/2023, 1:45 PM

## 2023-10-04 NOTE — Discharge Instructions (Addendum)
Based on the information that you have provided and the presenting issues outpatient services and resources for have been recommended.  It is imperative that you follow through with treatment recommendations within 5-7 days from the of discharge to mitigate further risk to your safety and mental well-being. A list of referrals has been provided below to get you started.  You are not limited to the list provided.  In case of an urgent crisis, you may contact the Mobile Crisis Unit with [facility] at [phone].               You have an CD_IOP intake assessment on Monday 10-08-23 at 9am. At [address] [place], [place]    [facility] CD-IOP Program Specialized Group Therapy for Substance Abuse If you have a substance abuse disorder (with or without a mental health condition), you may benefit from specialized substance abuse therapy in a group setting. According to discharge survey data, 70 percent of patients completing our chemical dependency intensive outpatient program report fewer symptoms of substance abuse and incidents of relapse.  Under the guidance of a licensed mental health professional, meet with your peers every Monday, Wednesday and Friday from 9 a.m. to 12 p.m. for 6 to 8 weeks to:  Learn about chemical dependency, mental illness and co-occurring disorders. Develop relapse-prevention skills. Set personalized goals with your treatment team. To build on the skills you gain, you can attend Alcoholics Anonymous or Narcotics Anonymous meetings in the evenings and access follow-up care through weekly group meetings with peers. Ongoing support promotes wellness and recovery.  For more information, call Myrna Blazer, LCSW at [phone]. We work directly with employers and families to ensure you receive the care you need.   _________________________________________________ Grief support is available through [facility], offering  support groups led by their bereavement team. These groups provide a safe and supportive environment to discuss concerns, fears, and questions. They aim to help individuals cope with the sadness and depression often felt after losing a family member or close friend. Grief counseling services are available to the families of hospice patients and to anyone in the community who has experienced a loss. All services are offered free of charge.  Contact Information: [facility] [address], [place], [place] [address] Phone: [phone] Fax: [phone]  For more information, please visit [facility] Grief Counseling.

## 2023-10-08 ENCOUNTER — Ambulatory Visit
Admission: RE | Admit: 2023-10-08 | Discharge: 2023-10-08 | Disposition: A | Discharge status: Home / Self Care | Payer: MEDICAID | Source: Ambulatory Visit | Attending: Internal Medicine | Admitting: Internal Medicine

## 2023-10-08 DIAGNOSIS — J984 Other disorders of lung: Secondary | ICD-10-CM

## 2023-10-10 ENCOUNTER — Ambulatory Visit (INDEPENDENT_AMBULATORY_CARE_PROVIDER_SITE_OTHER): Payer: Medicaid Other

## 2023-10-10 DIAGNOSIS — F1420 Cocaine dependence, uncomplicated: Secondary | ICD-10-CM

## 2023-10-10 DIAGNOSIS — F1994 Other psychoactive substance use, unspecified with psychoactive substance-induced mood disorder: Secondary | ICD-10-CM

## 2023-10-10 DIAGNOSIS — F4310 Post-traumatic stress disorder, unspecified: Secondary | ICD-10-CM

## 2023-10-10 DIAGNOSIS — F1020 Alcohol dependence, uncomplicated: Secondary | ICD-10-CM | POA: Diagnosis not present

## 2023-10-10 DIAGNOSIS — F1220 Cannabis dependence, uncomplicated: Secondary | ICD-10-CM
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
## 2023-10-12 ENCOUNTER — Ambulatory Visit (INDEPENDENT_AMBULATORY_CARE_PROVIDER_SITE_OTHER): Payer: MEDICAID

## 2023-10-12 DIAGNOSIS — F1994 Other psychoactive substance use, unspecified with psychoactive substance-induced mood disorder: Secondary | ICD-10-CM | POA: Diagnosis not present

## 2023-10-12 DIAGNOSIS — F1420 Cocaine dependence, uncomplicated: Secondary | ICD-10-CM | POA: Diagnosis not present

## 2023-10-12 DIAGNOSIS — F4310 Post-traumatic stress disorder, unspecified: Secondary | ICD-10-CM

## 2023-10-12 DIAGNOSIS — F1220 Cannabis dependence, uncomplicated: Secondary | ICD-10-CM

## 2023-10-12 DIAGNOSIS — F1020 Alcohol dependence, uncomplicated: Secondary | ICD-10-CM | POA: Diagnosis not present

## 2023-10-12 NOTE — Progress Notes (Addendum)
Daily Group Progress Note   Program: CD IOP     Group Time: 9:00  am-12 pm      Type of Therapy: Process and Psychoeducational    Topic: The therapists check in with group members, assess for SI/HI/psychosis and overall level of functioning. The therapists inquire about sobriety date and number of community support meetings attended since last session.    The therapists discuss the following issues today: Utilized [employer]: "Be Smart, Not Strong" to discuss how addiction is a medical disease, rather than a character disorder or moral failure. Had group complete the rating scale on their Recovery IQ which list  ways to be smart in recovery. The therapist discuss addiction as a medical disease, specifically explaining the process of the brain healing in recovery which usually takes around a year. Discussed components of self care that lead to better health. Therapists discuss how examining incongruence's can reveal one's true intent and challenge.  For example, if one says they want to be sober and yet keeps a bottle of alcohol in their house and does not want to rid themselves of it, this can indicate their current challenge. Therapists facilitate discussion on the issues of "What is the function of your addiction/disease?  Summary: This is [name]'s first CDIOP group.  She rates her depression as a "5" and her anxiety as a "7".  [name] reports her sobriety date as 10-04-23.  She says she has not attended any meeting but has in the past.  [name] says she cannot get out in this cold weather to go to meetings because of her arthritis.  Therapist suggests virtual meetings and she says she does not do meetings on the phone. She only plays "Candy Crush".  She does not have a sponsor.   [name] shares the she is going to a "home going" celebration as he best friend past a few days ago. She said they have already did her cremation. She states the celebration will be at "[employer] in [place].  Therapist ask if there will be alcohol at this gathering and she says there will be.  Therapist asks if this is best for her sobriety. She says she has to go to show respect but her daughter and granddaughter will be going with her and they won't stay long. She does say she will have to watch out for playmates as they are in this area.  [name] scores a 35 on the Recovery IQ.  When asks how to bump her score up, [name] says "to make me the more important person. I am my biggest problem. I don't know how to live without drugs since I started at age 20".   Time did not permit for [name] to share share her insights regarding the question, 'What is the function of your disease?"      Progress Towards Goals: [name] reports her sobriety date as 10-04-23   UDS collected: No Results: No   AA/NA attended?:  No  Sponsor?:  No  Amber Eisenmenger, [name], [name], [name]  [address] [place], [place], [facility], [name]  10/12/2023

## 2023-10-15 ENCOUNTER — Encounter: Payer: Self-pay | Admitting: Internal Medicine

## 2023-10-15 ENCOUNTER — Ambulatory Visit: Payer: 59 | Attending: Internal Medicine

## 2023-10-15 ENCOUNTER — Ambulatory Visit: Payer: MEDICAID | Attending: Internal Medicine | Admitting: Internal Medicine

## 2023-10-15 ENCOUNTER — Ambulatory Visit (INDEPENDENT_AMBULATORY_CARE_PROVIDER_SITE_OTHER): Payer: Medicaid Other | Admitting: Medical

## 2023-10-15 ENCOUNTER — Encounter (HOSPITAL_COMMUNITY): Payer: Self-pay | Admitting: Medical

## 2023-10-15 VITALS — BP 148/69 | HR 67 | Ht 64.0 in | Wt 142.0 lb

## 2023-10-15 VITALS — BP 120/70 | HR 76 | Resp 16 | Ht 64.0 in | Wt 143.6 lb

## 2023-10-15 DIAGNOSIS — Z62810 Personal history of physical and sexual abuse in childhood: Secondary | ICD-10-CM

## 2023-10-15 DIAGNOSIS — F1920 Other psychoactive substance dependence, uncomplicated: Secondary | ICD-10-CM

## 2023-10-15 DIAGNOSIS — I428 Other cardiomyopathies: Secondary | ICD-10-CM

## 2023-10-15 DIAGNOSIS — I340 Nonrheumatic mitral (valve) insufficiency: Secondary | ICD-10-CM

## 2023-10-15 DIAGNOSIS — F4310 Post-traumatic stress disorder, unspecified: Secondary | ICD-10-CM | POA: Diagnosis not present

## 2023-10-15 DIAGNOSIS — F1420 Cocaine dependence, uncomplicated: Secondary | ICD-10-CM

## 2023-10-15 DIAGNOSIS — R0602 Shortness of breath: Secondary | ICD-10-CM | POA: Diagnosis not present

## 2023-10-15 DIAGNOSIS — F17210 Nicotine dependence, cigarettes, uncomplicated: Secondary | ICD-10-CM

## 2023-10-15 DIAGNOSIS — T7491XS Unspecified adult maltreatment, confirmed, sequela: Secondary | ICD-10-CM | POA: Diagnosis not present

## 2023-10-15 DIAGNOSIS — T7422XS Child sexual abuse, confirmed, sequela: Secondary | ICD-10-CM

## 2023-10-15 DIAGNOSIS — F1220 Cannabis dependence, uncomplicated: Secondary | ICD-10-CM | POA: Diagnosis not present

## 2023-10-15 DIAGNOSIS — F1020 Alcohol dependence, uncomplicated: Secondary | ICD-10-CM | POA: Diagnosis not present

## 2023-10-15 DIAGNOSIS — G629 Polyneuropathy, unspecified: Secondary | ICD-10-CM | POA: Diagnosis not present

## 2023-10-15 DIAGNOSIS — R072 Precordial pain: Secondary | ICD-10-CM

## 2023-10-15 DIAGNOSIS — I509 Heart failure, unspecified: Secondary | ICD-10-CM | POA: Diagnosis not present

## 2023-10-15 DIAGNOSIS — R002 Palpitations: Secondary | ICD-10-CM

## 2023-10-15 DIAGNOSIS — Z720 Tobacco use: Secondary | ICD-10-CM

## 2023-10-15 DIAGNOSIS — F4321 Adjustment disorder with depressed mood: Secondary | ICD-10-CM | POA: Diagnosis not present

## 2023-10-15 DIAGNOSIS — Z6372 Alcoholism and drug addiction in family: Secondary | ICD-10-CM | POA: Diagnosis not present

## 2023-10-15 MED ORDER — GABAPENTIN 300 MG PO CAPS: 300.0000 mg | ORAL_CAPSULE | Freq: Three times a day (TID) | ORAL | 3 refills | Status: DC

## 2023-10-15 MED ORDER — BACLOFEN 10 MG PO TABS
10.0000 mg | ORAL_TABLET | Freq: Three times a day (TID) | ORAL | 0 refills | Status: DC
Start: 2023-10-15 — End: 2024-09-23
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
## 2023-10-15 NOTE — Patient Instructions (Signed)
Medication Instructions:  Your physician recommends that you continue on your current medications as directed. Please refer to the Current Medication list given to you today.  *If you need a refill on your cardiac medications before your next appointment, please call your pharmacy*   Lab Work: NONE  If you have labs (blood work) drawn today and your tests are completely normal, you will receive your results only by: MyChart Message (if you have MyChart) OR A paper copy in the mail If you have any lab test that is abnormal or we need to change your treatment, we will call you to review the results.   Testing/Procedures: Your physician has requested that you have an echocardiogram. Echocardiography is a painless test that uses sound waves to create images of your heart. It provides your doctor with information about the size and shape of your heart and how well your heart's chambers and valves are working. This procedure takes approximately one hour. There are no restrictions for this procedure. Please do NOT wear cologne, perfume, aftershave, or lotions (deodorant is allowed). Please arrive 15 minutes prior to your appointment time.  Please note: We ask at that you not bring children with you during ultrasound (echo/ vascular) testing. Due to room size and safety concerns, children are not allowed in the ultrasound rooms during exams. Our front office staff cannot provide observation of children in our lobby area while testing is being conducted. An adult accompanying a patient to their appointment will only be allowed in the ultrasound room at the discretion of the ultrasound technician under special circumstances. We apologize for any inconvenience.   Your physician has requested that you wear a heart monitor.    Follow-Up: At [facility], you and your health needs are our priority.  As part of our continuing mission to provide you with exceptional heart care, we have created  designated Provider Care Teams.  These Care Teams include your primary Cardiologist (physician) and Advanced Practice Providers (APPs -  Physician Assistants and Nurse Practitioners) who all work together to provide you with the care you need, when you need it.  We recommend signing up for the patient portal called "MyChart".  Sign up information is provided on this After Visit Summary.  MyChart is used to connect with patients for Virtual Visits (Telemedicine).  Patients are able to view lab/test results, encounter notes, upcoming appointments, etc.  Non-urgent messages can be sent to your provider as well.   To learn more about what you can do with MyChart, go to [email or web address].    Your next appointment:   3 month(s)  Provider:   Ronie Spies, PA-C, Robin Searing, NP, Jacolyn Reedy, PA-C, Eligha Bridegroom, NP, Tereso Newcomer, PA-C, or Perlie Gold, PA-C        Other Instructions [name]- Long Term Monitor Instructions  Your physician has requested you wear a ZIO patch monitor for 14 days.  This is a single patch monitor. [employer] supplies one patch monitor per enrollment. Additional stickers are not available. Please do not apply patch if you will be having a Nuclear Stress Test,   Cardiac CT, MRI, or Chest Xray during the period you would be wearing the  monitor. The patch cannot be worn during these tests. You cannot remove and re-apply the  ZIO XT patch monitor.  Your ZIO patch monitor will be mailed 3 day USPS to your address on file. It may take 3-5 days  to receive your monitor after you have been enrolled.  Once you have received your monitor, please review the enclosed instructions. Your monitor  has already been registered assigning a specific monitor serial # to you.  Billing and Patient Assistance Program Information  We have supplied [employer] with any of your insurance information on file for billing purposes. [employer] offers a sliding scale Patient Assistance Program for  patients that do not have  insurance, or whose insurance does not completely cover the cost of the ZIO monitor.  You must apply for the Patient Assistance Program to qualify for this discounted rate.  To apply, please call [employer] at [phone], select option 4, select option 2, ask to apply for  Patient Assistance Program. Meredeth Ide will ask your household income, and how many people  are in your household. They will quote your out-of-pocket cost based on that information.  [employer] will also be able to set up a 110-month, interest-free payment plan if needed.  Applying the monitor   Shave hair from upper left chest.  Hold abrader disc by orange tab. Rub abrader in 40 strokes over the upper left chest as  indicated in your monitor instructions.  Clean area with 4 enclosed alcohol pads. Let dry.  Apply patch as indicated in monitor instructions. Patch will be placed under collarbone on left  side of chest with arrow pointing upward.  Rub patch adhesive wings for 2 minutes. Remove white label marked "1". Remove the white  label marked "2". Rub patch adhesive wings for 2 additional minutes.  While looking in a mirror, press and release button in center of patch. A small green light will  flash 3-4 times. This will be your only indicator that the monitor has been turned on.  Do not shower for the first 24 hours. You may shower after the first 24 hours.  Press the button if you feel a symptom. You will hear a small click. Record Date, Time and  Symptom in the Patient Logbook.  When you are ready to remove the patch, follow instructions on the last 2 pages of Patient  Logbook. Stick patch monitor onto the last page of Patient Logbook.  Place Patient Logbook in the blue and white box. Use locking tab on box and tape box closed  securely. The blue and white box has prepaid postage on it. Please place it in the mailbox as  soon as possible. Your physician should have your test results approximately  7 days after the  monitor has been mailed back to [facility].  Call [facility] Customer Care at [phone] if you have questions regarding  your ZIO XT patch monitor. Call them immediately if you see an orange light blinking on your  monitor.  If your monitor falls off in less than 4 days, contact our Monitor department at [phone].  If your monitor becomes loose or falls off after 4 days call [employer] at [phone] for  suggestions on securing your monitor

## 2023-10-15 NOTE — Progress Notes (Signed)
Cardiology Office Note:  .    Date:  10/15/2023  ID:  [name], DOB 1961-01-26, MRN [id number] PCP: Carmina Miller, DO  [facility] Providers Cardiologist:  Christell Constant, MD     CC: Chest pain- re-establish with [facility]   History of Present Illness: .    [name] is a 62 y.o. female with a hx of alcohol associated cardiomyopathy and prior VF arrest (AHF 2017) with recovery presents with new chest pain.  [name], a 62 year old patient with a significant history of alcohol abuse, presents with new onset exertional chest discomfort and shortness of breath. She describes the chest discomfort as a pressure that builds up during walking and resolves upon rest. She also reports experiencing episodes of heart fluttering, described as feeling like butterflies, approximately four times in the past two weeks. These episodes seem to occur more frequently during periods of excitement or anxiety.  She has a past medical history of ventricular fibrillation arrest in 2017, which required resuscitation and defibrillation. Post-arrest, she was found to have severe heart failure with reduced ejection fraction and moderate mitral regurgitation. However, with medication and cessation of alcohol, her heart function improved and returned to normal. She also has a history of aortic atherosclerosis.  Negative heart catheterization.  She has a long history of smoking, starting at the age of 32, but has significantly reduced the habit to about one cigarette a day.   Her symptoms of exertional chest discomfort, shortness of breath, and heart fluttering, in the context of her past medical history, raise concerns about potential cardiac issues. Her long history of smoking also poses a risk for COPD.   Relevant histories: .  Social from [place] ROS: As per HPI.   Studies Reviewed: .   Cardiac Studies & Procedures      ECHOCARDIOGRAM  ECHOCARDIOGRAM COMPLETE  05/07/2023  Narrative ECHOCARDIOGRAM REPORT    Patient Name:   [name] Date of Exam: 05/07/2023 Medical Rec #:  [id number]         Height:       64.0 in Accession #:    [id number]        Weight:       144.7 lb Date of Birth:  [birth year]          BSA:          1.705 m Patient Age:    62 years          BP:           127/66 mmHg Patient Gender: F                 HR:           66 bpm. Exam Location:  Outpatient  Procedure: 2D Echo, 3D Echo, Cardiac Doppler, Color Doppler and Strain Analysis  Indications:    Dyspnea R06.00  History:        Patient has prior history of Echocardiogram examinations, most recent 09/20/2015. Cardiomyopathy, Stroke, Arrythmias:LBBB, Signs/Symptoms:Chest Pain, Shortness of Breath and Dyspnea; Risk Factors:ETOH, Hypertension and Current Smoker.  Sonographer:    Eulah Pont RDCS Referring Phys: [id number] GRACE LAU   Sonographer Comments: Global longitudinal strain was attempted. IMPRESSIONS   1. Left ventricular ejection fraction, by estimation, is 55 to 60%. Left ventricular ejection fraction by 3D volume is 58 %. The left ventricle has normal function. The left ventricle has no regional wall motion abnormalities. Left ventricular diastolic parameters are consistent with Grade II diastolic  dysfunction (pseudonormalization). Elevated left atrial pressure. The average left ventricular global longitudinal strain is -18.4 %. The global longitudinal strain is normal. 2. Right ventricular systolic function is normal. The right ventricular size is normal. There is normal pulmonary artery systolic pressure. The estimated right ventricular systolic pressure is 24.9 mmHg. 3. Left atrial size was mildly dilated. 4. Cannot exclude flail motion of a small segment of the P2 (middle) scallop of the posterior mitral leaflet. The mitral valve is abnormal. Moderate mitral valve regurgitation. No evidence of mitral stenosis. Moderate mitral annular calcification. 5. The  aortic valve is tricuspid. Aortic valve regurgitation is not visualized. No aortic stenosis is present. 6. The inferior vena cava is normal in size with greater than 50% respiratory variability, suggesting right atrial pressure of 3 mmHg.  Comparison(s): Prior images unable to be directly viewed, comparison made by report only. The left ventricular function has improved. Mitral insufficiency severity is unchanged.  FINDINGS Left Ventricle: Left ventricular ejection fraction, by estimation, is 55 to 60%. Left ventricular ejection fraction by 3D volume is 58 %. The left ventricle has normal function. The left ventricle has no regional wall motion abnormalities. The average left ventricular global longitudinal strain is -18.4 %. The global longitudinal strain is normal. The left ventricular internal cavity size was normal in size. There is no left ventricular hypertrophy. Abnormal (paradoxical) septal motion, consistent with left bundle branch block. Left ventricular diastolic parameters are consistent with Grade II diastolic dysfunction (pseudonormalization). Elevated left atrial pressure.  Right Ventricle: The right ventricular size is normal. No increase in right ventricular wall thickness. Right ventricular systolic function is normal. There is normal pulmonary artery systolic pressure. The tricuspid regurgitant velocity is 2.34 m/s, and with an assumed right atrial pressure of 3 mmHg, the estimated right ventricular systolic pressure is 24.9 mmHg.  Left Atrium: Left atrial size was mildly dilated.  Right Atrium: Right atrial size was normal in size.  Pericardium: There is no evidence of pericardial effusion.  Mitral Valve: Cannot exclude flail motion of a small segment of the P2 (middle) scallop of the posterior mitral leaflet. The mitral valve is abnormal. Moderate mitral annular calcification. Moderate mitral valve regurgitation, with eccentric medially directed jet. No evidence of mitral  valve stenosis.  Tricuspid Valve: The tricuspid valve is normal in structure. Tricuspid valve regurgitation is mild.  Aortic Valve: The aortic valve is tricuspid. Aortic valve regurgitation is not visualized. No aortic stenosis is present.  Pulmonic Valve: The pulmonic valve was not well visualized. Pulmonic valve regurgitation is not visualized.  Aorta: The aortic root and ascending aorta are structurally normal, with no evidence of dilitation.  Venous: A normal flow pattern is recorded from the right upper pulmonary vein. The inferior vena cava is normal in size with greater than 50% respiratory variability, suggesting right atrial pressure of 3 mmHg.  IAS/Shunts: No atrial level shunt detected by color flow Doppler.   LEFT VENTRICLE PLAX 2D LVIDd:         4.30 cm         Diastology LVIDs:         2.90 cm         LV e' medial:    5.96 cm/s LV PW:         1.00 cm         LV E/e' medial:  21.1 LV IVS:        1.00 cm         LV e' lateral:  6.64 cm/s LVOT diam:     1.80 cm         LV E/e' lateral: 19.0 LV SV:         50 LV SV Index:   29              2D LVOT Area:     2.54 cm        Longitudinal Strain 2D Strain GLS  -18.4 % LV Volumes (MOD)               Avg: LV vol d, MOD    80.5 ml A2C:                           3D Volume EF LV vol d, MOD    83.5 ml       LV 3D EF:    Left A4C:                                        ventricul LV vol s, MOD    37.4 ml                    ar A2C:                                        ejection LV vol s, MOD    37.2 ml                    fraction A4C:                                        by 3D LV SV MOD A2C:   43.1 ml                    volume is LV SV MOD A4C:   83.5 ml                    58 %. LV SV MOD BP:    46.7 ml  3D Volume EF: 3D EF:        58 % LV EDV:       131 ml LV ESV:       54 ml LV SV:        76 ml  RIGHT VENTRICLE RV S prime:     9.06 cm/s TAPSE (M-mode): 2.2 cm  LEFT ATRIUM             Index        RIGHT ATRIUM            Index LA diam:        4.40 cm 2.58 cm/m   RA Area:     13.20 cm LA Vol (A2C):   61.7 ml 36.19 ml/m  RA Volume:   31.10 ml  18.24 ml/m LA Vol (A4C):   41.5 ml 24.34 ml/m LA Biplane Vol: 51.0 ml 29.91 ml/m AORTIC VALVE LVOT Vmax:   89.90 cm/s LVOT Vmean:  56.600 cm/s LVOT VTI:    0.197 m  AORTA Ao Root diam: 2.90 cm Ao Asc diam:  3.10 cm  MITRAL VALVE  TRICUSPID VALVE MV Area (PHT): 4.06 cm     TR Peak grad:   21.9 mmHg MV Decel Time: 187 msec     TR Vmax:        234.00 cm/s MR PISA:        1.01 cm MR PISA Radius: 0.40 cm     SHUNTS MV E velocity: 126.00 cm/s  Systemic VTI:  0.20 m MV A velocity: 121.00 cm/s  Systemic Diam: 1.80 cm MV E/A ratio:  1.04  Mihai Croitoru MD Electronically signed by Thurmon Fair MD Signature Date/Time: 05/07/2023/2:32:24 PM    Final     CARDIAC MRI  MR CARDIAC MORPHOLOGY W WO CONTRAST 01/29/2013  Narrative *RADIOLOGY REPORT*  Clinical Data: Cardiac arrest cardiomyopathy  MR CARDIA MORPHOLOGY WITHOUT AND WITH CONTRAST  GE 1.5 T magnet with dedicated cardiac coil.  FIESTA sequences for function and morphology.  10 minutes after 15 mL Multihance contrast was injected, inversion recovery sequences were done to assess for myocardial delayed enhancement.  EF was calculated at a dedicated workstation.  Contrast: 15mL MULTIHANCE GADOBENATE DIMEGLUMINE 529 MG/ML IV SOLN  Comparison: None.  Findings: Normal left ventricular size and wall thickness.  EF 51% with mild mid to apical septal hypokinesis.  Normal right ventricular size and systolic function.  Mild left atrial enlargement.  Normal right atrial size.  Mitral regurgitation appears mild but flow sequences to quantify were not done.  No significant aortic regurgitation or stenosis.  On delayed enhancement imaging, there was a very small area of basal inferior subendocardial enhancement.  There was also a very small area of mid inferolateral mid-wall  enhancement.  Measurements:  LV EDV 111 mL  LV SV 57 mL  LV EF 51%  IMPRESSION: 1. Normal LV size with mildly decreased systolic function, EF 51%. There was mid to apical septal hypokinesis.  2. Normal RV size and systolic function.  3. Very small areas of delayed enhancement in the basal inferior and mid inferolateral walls.  This is not a coronary disease pattern.  Cannot rule out prior myocarditis or infiltrative disease such as sarcoidosis.   Original Report Authenticated By: [name]          Physical Exam:    VS:  BP 120/70 (BP Location: Left Arm, Patient Position: Sitting, Cuff Size: Normal)   Pulse 76   Resp 16   Ht 5\' 4"  (1.626 m)   Wt 143 lb 9.6 oz (65.1 kg)   LMP 11/02/2010   SpO2 96%   BMI 24.65 kg/m    Wt Readings from Last 3 Encounters:  10/15/23 143 lb 9.6 oz (65.1 kg)  10/15/23 142 lb (64.4 kg)  08/31/23 144 lb 9.6 oz (65.6 kg)    Gen: no distress  Ears: no Homero Fellers Sign Cardiac: No Rubs or Gallops, systolic murmur Murmur, RRR +2 radial pulses Respiratory: Clear to auscultation bilaterally, normal effort, normal  respiratory rate GI: Soft, nontender, non-distended  MS: No  edema;  moves all extremities Integument: Skin feels warm Neuro:  At time of evaluation, alert and oriented to person/place/time/situation  Psych: Normal affect, patient feels well   ASSESSMENT AND PLAN: .    Mitral Regurgitation Non-rheumatic mitral regurgitation with previously severely reduced ejection fraction (EF) that had recovered. Currently experiencing new exertional chest pain and shortness of breath. Differential includes worsening mitral regurgitation or other cardiac etiologies. Discussed risks of untreated mitral regurgitation, including worsening heart failure and potential need for surgical intervention. Echocardiogram will assess severity and guide further treatment. -  Order echocardiogram - If echocardiogram is negative, consented for nuclear medicine  stress test (PET) due to left bundle branch block - Consider heart catheterization if significant mitral regurgitation is found; she has a seizure history and does not believe the contrast caused this (significant prior alcohol history)  Heart Failure Heart failure with reduced ejection fraction secondary to alcohol abuse, improved with cessation of alcohol. Currently experiencing symptoms suggestive of heart failure exacerbation, including shortness of breath and exertional chest pain. Emphasized the need for smoking cessation to improve overall cardiovascular health. - Monitor symptoms and adjust treatment based on echocardiogram and stress test results  Ventricular Fibrillation Arrest- new palpitations Ventricular fibrillation arrest in 2017, resuscitated with defibrillation and underwent hyperthermia protocol. Currently experiencing new palpitations described as fluttering. Discussed potential risks of recurrent arrhythmias and importance of heart monitoring. - Order one-week non-live ZIO patch for heart monitoring  Aortic Atherosclerosis Aortic atherosclerosis with previous heart catheterization in 2015 showing no significant coronary artery disease. Current symptoms may be related to atherosclerosis progression. Discussed potential for atherosclerosis to contribute to chest pain and need for further assessment with echocardiogram and stress test. - Monitor and reassess based on echocardiogram and stress test results, LDL goal < 70 for her HLD  General Health Maintenance Significant alcohol abuse and long-term smoking. Advised on importance of smoking cessation for overall cardiovascular health. Discussed use of nicotine patches as a smoking cessation aid. - Encourage smoking cessation  Follow-up - Follow up with my team in three months if tests are normal - Follow up sooner if significant findings are noted in tests.   Riley Lam, MD FASE [facility] Cardiologist [facility]  [address] [place], [address] [place], [place] [address] [phone]  5:06 PM
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
## 2023-10-15 NOTE — Progress Notes (Unsigned)
Enrolled for [employer] to mail a ZIO XT long term holter monitor to the patients address on file.  

## 2023-10-17 ENCOUNTER — Ambulatory Visit (INDEPENDENT_AMBULATORY_CARE_PROVIDER_SITE_OTHER): Payer: MEDICAID | Admitting: Licensed Clinical Social Worker

## 2023-10-17 DIAGNOSIS — F1220 Cannabis dependence, uncomplicated: Secondary | ICD-10-CM

## 2023-10-17 DIAGNOSIS — F1020 Alcohol dependence, uncomplicated: Secondary | ICD-10-CM

## 2023-10-17 DIAGNOSIS — F4310 Post-traumatic stress disorder, unspecified: Secondary | ICD-10-CM

## 2023-10-17 DIAGNOSIS — F1420 Cocaine dependence, uncomplicated: Secondary | ICD-10-CM
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
## 2023-10-18 ENCOUNTER — Ambulatory Visit (HOSPITAL_COMMUNITY): Payer: 59 | Attending: Cardiology

## 2023-10-18 DIAGNOSIS — I509 Heart failure, unspecified: Secondary | ICD-10-CM

## 2023-10-18 DIAGNOSIS — I340 Nonrheumatic mitral (valve) insufficiency: Secondary | ICD-10-CM

## 2023-10-18 DIAGNOSIS — R0602 Shortness of breath: Secondary | ICD-10-CM | POA: Insufficient documentation

## 2023-10-18 DIAGNOSIS — R002 Palpitations: Secondary | ICD-10-CM

## 2023-10-18 DIAGNOSIS — R072 Precordial pain: Secondary | ICD-10-CM | POA: Diagnosis present

## 2023-10-18 LAB — ECHOCARDIOGRAM COMPLETE
Area-P 1/2: 3.59 cm2
S' Lateral: 2.9 cm

## 2023-10-19 ENCOUNTER — Ambulatory Visit (INDEPENDENT_AMBULATORY_CARE_PROVIDER_SITE_OTHER): Payer: 59

## 2023-10-19 DIAGNOSIS — F1020 Alcohol dependence, uncomplicated: Secondary | ICD-10-CM | POA: Diagnosis not present

## 2023-10-19 DIAGNOSIS — F1420 Cocaine dependence, uncomplicated: Secondary | ICD-10-CM

## 2023-10-19 DIAGNOSIS — F1220 Cannabis dependence, uncomplicated: Secondary | ICD-10-CM

## 2023-10-19 NOTE — Progress Notes (Signed)
Daily Group Progress Note   Program: CD IOP     Group Time: 9:00  am-12 pm      Type of Therapy: Process and Psychoeducational    Topic: The therapists check in with group members, assess for SI/HI/psychosis and overall level of functioning. The therapists inquire about sobriety date and number of community support meetings attended since last session.    Therapists utilize the material in the [employer] on [place] and Recovery for the group which lists possible issues that could result in use during the holidays. Therapists prompt discussion among the group members regarding which issues may impact them during the holidays. Therapist also briefly discuss Post Acute Withdrawal symptoms and how these impact one's recovery.  Therapist discuss systems therapy and how to interrupt the process that continues the feedback loop of dysfunctional interactions.  Summary: [name] rates her depression as a "8" and her anxiety as a "7".  She reports no change in her sobriety date of 10/14/23.  [name] identifies her emotion as "anxious".  [name] says she has not been to any meetings.  She says she was going with her nephew but his car has broken down. She says she has to go to the Pulmonologist on Monday and has now wearing her heart monitor.  [name] shares with a peer that she used to stay in arguments with her daughter until [name] realized she had to change her own behavior to effect change. [name] says her Care Team informed her she needed to look for a private property and apply to housing.   [name] says she is not looking forward to Christmas as family will be in the house where she is living with her daughter and the only sober people that will be there are the children.  Therapist discuss how she needs to make a plan on what she will do.      Progress Towards Goals: [name] reports her sobriety date as 10-14-23   UDS collected: No Results: Positive for Cocaine   AA/NA attended?:  No  Sponsor?:   No  Remigio Eisenmenger, MS, LMFT, LCAS  [address], [place], [place], [facility], LCAS  10/12/2023

## 2023-10-22 ENCOUNTER — Encounter: Payer: Self-pay | Admitting: Pulmonary Disease

## 2023-10-22 ENCOUNTER — Institutional Professional Consult (permissible substitution): Payer: MEDICAID | Admitting: Pulmonary Disease

## 2023-10-22 ENCOUNTER — Ambulatory Visit (HOSPITAL_COMMUNITY): Payer: MEDICAID

## 2023-10-22 ENCOUNTER — Ambulatory Visit (INDEPENDENT_AMBULATORY_CARE_PROVIDER_SITE_OTHER): Payer: MEDICAID | Admitting: Pulmonary Disease

## 2023-10-22 VITALS — BP 127/77 | HR 75 | Temp 98.0°F | Ht 64.0 in | Wt 145.2 lb

## 2023-10-22 DIAGNOSIS — J984 Other disorders of lung: Secondary | ICD-10-CM | POA: Diagnosis not present

## 2023-10-22 DIAGNOSIS — R942 Abnormal results of pulmonary function studies: Secondary | ICD-10-CM | POA: Diagnosis not present

## 2023-10-22 DIAGNOSIS — L219 Seborrheic dermatitis, unspecified: Secondary | ICD-10-CM

## 2023-10-22 DIAGNOSIS — F17211 Nicotine dependence, cigarettes, in remission: Secondary | ICD-10-CM | POA: Diagnosis not present

## 2023-10-22 MED ORDER — TRELEGY ELLIPTA 100-62.5-25 MCG/ACT IN AEPB: 1.0000 | INHALATION_SPRAY | Freq: Every day | RESPIRATORY_TRACT | Status: DC

## 2023-10-22 NOTE — Progress Notes (Deleted)
Synopsis: Referred in December 2024 for restrictive lung disease by Dickie La, MD  Subjective:   [name] Born GENDER: female DOB: 11/28/1960, MRN: [id number]   HPI  No chief complaint on file.  [name] is a 62 year old woman, daily smoker with history of hypertension, DVT/PE, alchohols abuse, cardiomyopathy and V. Fib arrest in 2017 who is referred to pulmonary clinic for evaluation of restrictive lung disease.   PFTs 05/07/23 show mild restriction TLC 3.24L (64%) and moderate diffusion defect (52%). FEV1 1.42L (56%) and FVC 1.85L (56%).   HRCT 10/08/23 showed patchy areas of very mild ground-glass attenuation scattered throughout the lungs bilaterally.   Past Medical History:  Diagnosis Date   Alcoholism (HCC)    ANEMIA, VITAMIN B12 DEFICIENCY 11/11/2007   Documented low 175 on 09/2007; received 6 yrs of IM therapy; switched to po 05/2013    Anxiety    Breast pain, left 06/02/2021   Cardiac arrest (HCC) 01/16/2013   witnessed, shockable rhythm, initally low EF, no CAD at cath. Drug screen negative.   Chest pain    a. 04/2009 Echo: EF 55-60%, Gr1 DD, Mild MR.   Depression    Duodenitis    secondary to H pylori(treatment completed 05/2006), +/- NSAIDS   Duodenitis    a. 05/2006 2/2 H pylori +/- NSAIDS   DVT (deep venous thrombosis) (HCC)    a. 04/2009 - treated with coumadin x 8 mos.   ETOH abuse    Heart murmur    History of cocaine abuse (HCC)    History of CVA (cerebrovascular accident)    Hypertension    Left arm pain 06/11/2017   PE 05/16/2009   CT angio positive for PE in 07/10, treated with coumadin for 8 months.    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 04/2009 - treated with coumadin x 8 mos.   Rectal bleeding 2007   internal hemmorhoids by colonoscopy in 04/2006, Bx neg for IBD   Rectal bleeding    a. int hemorrhoids by colonoscopy in 04/2006, Bx neg for IBD   Right foot pain 05/05/2020   Seizures (HCC)    Shortness of breath 05/28/2020    Stroke (HCC)    Tobacco abuse    Trochanteric bursitis of left hip 05/22/2016     Family History  Problem Relation Age of Onset   Cancer Mother    Breast cancer Mother 40   Heart disease Mother    Diabetes Mother    CAD Mother    Heart attack Mother    Hypertension Mother    Cancer Father    Heart disease Father    Colon cancer Father    CAD Father    Cancer Sister    Breast cancer Sister 74   Diabetes Sister    Hypertension Sister    Heart disease Brother    Diabetes Brother    CAD Brother    Breast cancer Niece 70   Coronary artery disease Other        1 st degree female relative <60 and female <50   Diabetes type II Other        1 st degree relative   Colon polyps Neg Hx    Esophageal cancer Neg Hx    Rectal cancer Neg Hx    Stomach cancer Neg Hx      Social History   Socioeconomic History   Marital status: Single    Spouse name: Not  on file   Number of children: 2   Years of education: Not on file   Highest education level: 11th grade  Occupational History   Occupation: Unemployed  Tobacco Use   Smoking status: Some Days    Current packs/day: 0.10    Types: Cigarettes   Smokeless tobacco: Never   Tobacco comments:    smokes about 1-2 cigs/day.  Has patches   Vaping Use   Vaping status: Never Used  Substance and Sexual Activity   Alcohol use: Yes    Comment: beer   Drug use: Yes    Types: Marijuana   Sexual activity: Yes    Partners: Male    Birth control/protection: Post-menopausal  Other Topics Concern   Not on file  Social History Narrative   ** Merged History Encounter **       ** Data from: 12/05/12 Enc Dept: [facility]   No cocaine or alcohol use since hospital discharge 05/19/2009.      10/13 update:  States she is smoking 1-2 cigarettes daily with at least 1 drink daily usually Brandy.                  ** Data from: 01/17/13 Enc Dept: [facility]   Pt lives in [name] with a roommate.      Social Drivers of Manufacturing engineer Strain: Low Risk  (03/14/2023)   Overall Financial Resource Strain (CARDIA)    Difficulty of Paying Living Expenses: Not hard at all  Food Insecurity: No Food Insecurity (03/14/2023)   Hunger Vital Sign    Worried About Running Out of Food in the Last Year: Never true    Ran Out of Food in the Last Year: Never true  Transportation Needs: Unmet Transportation Needs (03/14/2023)   PRAPARE - Transportation    Lack of Transportation (Medical): Yes    Lack of Transportation (Non-Medical): Yes  Physical Activity: Sufficiently Active (03/14/2023)   Exercise Vital Sign    Days of Exercise per Week: 7 days    Minutes of Exercise per Session: 30 min  Stress: No Stress Concern Present (03/14/2023)   [employer] of Occupational Health - Occupational Stress Questionnaire    Feeling of Stress : Not at all  Social Connections: Moderately Isolated (03/14/2023)   Social Connection and Isolation Panel [NHANES]    Frequency of Communication with Friends and Family: More than three times a week    Frequency of Social Gatherings with Friends and Family: Once a week    Attends Religious Services: More than 4 times per year    Active Member of [employer] or Organizations: No    Attends Banker Meetings: Never    Marital Status: Never married  Intimate Partner Violence: Not At Risk (03/14/2023)   Humiliation, Afraid, Rape, and Kick questionnaire    Fear of Current or Ex-Partner: No    Emotionally Abused: No    Physically Abused: No    Sexually Abused: No     Allergies  Allergen Reactions   Iodinated Contrast Media Other (See Comments)    Seizure   Omnipaque [Iohexol] Other (See Comments)    Seizure      Outpatient Medications Prior to Visit  Medication Sig Dispense Refill   Acetaminophen Extra Strength 500 MG CAPS Take 1 capsule by mouth 3 (three) times daily.     albuterol (VENTOLIN HFA) 108 (90 Base) MCG/ACT inhaler Inhale 2 puffs into the lungs every 4 (four)  hours as needed for wheezing or shortness of breath. 6.7 g 0   aspirin EC 81 MG tablet Take 1 tablet (81 mg total) by mouth daily. Swallow whole. 150 tablet 2   atorvastatin (LIPITOR) 40 MG tablet Take 1 tablet (40 mg total) by mouth daily. 90 tablet 3   baclofen (LIORESAL) 10 MG tablet Take 1 tablet (10 mg total) by mouth 3 (three) times daily. 30 each 0   carvedilol (COREG) 6.25 MG tablet Take 1 tablet (6.25 mg total) by mouth 2 (two) times daily. 180 tablet 3   Cholecalciferol 25 MCG (1000 UT) CHEW Chew 1 tablet (1,000 Units total) by mouth daily. 90 tablet 0   diclofenac Sodium (VOLTAREN) 1 % GEL Apply 2 g topically 4 (four) times daily. 100 g 0   gabapentin (NEURONTIN) 300 MG capsule Take 1 capsule (300 mg total) by mouth 3 (three) times daily. 270 capsule 3   nitroGLYCERIN (NITROSTAT) 0.4 MG SL tablet Place 1 tablet (0.4 mg total) under the tongue every 5 (five) minutes as needed for chest pain. 25 tablet 1   omeprazole (PRILOSEC) 40 MG capsule Take 40 mg by mouth daily.     terbinafine (LAMISIL) 250 MG tablet Take 1 tablet (250 mg total) by mouth daily. 90 tablet 0   traZODone (DESYREL) 50 MG tablet Take 1 tablet (50 mg total) by mouth at bedtime as needed for sleep. 30 tablet 0   No facility-administered medications prior to visit.    ROS    Objective:  There were no vitals filed for this visit.   Physical Exam    CBC    Component Value Date/Time   WBC 9.7 06/05/2023 1037   WBC 7.6 03/07/2023 0048   RBC 4.20 06/05/2023 1037   RBC 3.88 03/07/2023 0048   HGB 11.9 06/05/2023 1037   HCT 36.5 06/05/2023 1037   PLT 288 06/05/2023 1037   MCV 87 06/05/2023 1037   MCH 28.3 06/05/2023 1037   MCH 28.4 03/07/2023 0048   MCHC 32.6 06/05/2023 1037   MCHC 32.1 03/07/2023 0048   RDW 12.8 06/05/2023 1037   LYMPHSABS 2.5 03/07/2023 0048   LYMPHSABS 2.7 05/05/2020 1651   MONOABS 0.9 03/07/2023 0048   EOSABS 0.4 03/07/2023 0048   EOSABS 0.1 05/05/2020 1651   BASOSABS 0.0  03/07/2023 0048   BASOSABS 0.0 05/05/2020 1651      Latest Ref Rng & Units 03/07/2023   12:48 AM 03/29/2022    3:16 PM 05/05/2020    4:51 PM  BMP  Glucose 70 - 99 mg/dL 161  096  91   BUN 6 - 20 mg/dL 11  9  8    Creatinine 0.44 - 1.00 mg/dL 0.45  4.09  8.11   BUN/Creat Ratio 9 - 23   13   Sodium 135 - 145 mmol/L 136  141  141   Potassium 3.5 - 5.1 mmol/L 3.8  3.9  3.9   Chloride 98 - 111 mmol/L 105  109  105   CO2 22 - 32 mmol/L 22  26  24    Calcium 8.9 - 10.3 mg/dL 8.4  9.1  9.0    Chest imaging: HRCT Chest 10/08/23 Mediastinum/Nodes: No pathologically enlarged mediastinal or hilar lymph nodes. Please note that accurate exclusion of hilar adenopathy is limited on noncontrast CT scans. Esophagus is unremarkable in appearance. No axillary lymphadenopathy.   Lungs/Pleura: High-resolution images demonstrate some patchy areas of very mild ground-glass attenuation scattered throughout the lungs bilaterally. No substantial septal thickening,  subpleural reticulation, traction bronchiectasis or honeycombing. Inspiratory and expiratory imaging is unremarkable. No acute consolidative airspace disease. No pleural effusions. No definite suspicious appearing pulmonary nodules or masses are noted.  PFT:    Latest Ref Rng & Units 05/07/2023   10:25 AM  PFT Results  FVC-Pre L 1.90   FVC-Predicted Pre % 57   FVC-Post L 1.85   FVC-Predicted Post % 56   Pre FEV1/FVC % % 79   Post FEV1/FCV % % 77   FEV1-Pre L 1.50   FEV1-Predicted Pre % 59   FEV1-Post L 1.42   DLCO uncorrected ml/min/mmHg 10.63   DLCO UNC% % 52   DLVA Predicted % 93   TLC L 3.24   TLC % Predicted % 64   RV % Predicted % 61     Labs:  Path:  Echo 10/18/23: LV EF 55-60%, Grade I diastolic dysfunction. RV size and function are normal. LA moderately dilated. RA mildly dilated.   Heart Catheterization:     Assessment & Plan:   No diagnosis found.  Discussion: ***    Current Outpatient Medications:     Acetaminophen Extra Strength 500 MG CAPS, Take 1 capsule by mouth 3 (three) times daily., Disp: , Rfl:    albuterol (VENTOLIN HFA) 108 (90 Base) MCG/ACT inhaler, Inhale 2 puffs into the lungs every 4 (four) hours as needed for wheezing or shortness of breath., Disp: 6.7 g, Rfl: 0   aspirin EC 81 MG tablet, Take 1 tablet (81 mg total) by mouth daily. Swallow whole., Disp: 150 tablet, Rfl: 2   atorvastatin (LIPITOR) 40 MG tablet, Take 1 tablet (40 mg total) by mouth daily., Disp: 90 tablet, Rfl: 3   baclofen (LIORESAL) 10 MG tablet, Take 1 tablet (10 mg total) by mouth 3 (three) times daily., Disp: 30 each, Rfl: 0   carvedilol (COREG) 6.25 MG tablet, Take 1 tablet (6.25 mg total) by mouth 2 (two) times daily., Disp: 180 tablet, Rfl: 3   Cholecalciferol 25 MCG (1000 UT) CHEW, Chew 1 tablet (1,000 Units total) by mouth daily., Disp: 90 tablet, Rfl: 0   diclofenac Sodium (VOLTAREN) 1 % GEL, Apply 2 g topically 4 (four) times daily., Disp: 100 g, Rfl: 0   gabapentin (NEURONTIN) 300 MG capsule, Take 1 capsule (300 mg total) by mouth 3 (three) times daily., Disp: 270 capsule, Rfl: 3   nitroGLYCERIN (NITROSTAT) 0.4 MG SL tablet, Place 1 tablet (0.4 mg total) under the tongue every 5 (five) minutes as needed for chest pain., Disp: 25 tablet, Rfl: 1   omeprazole (PRILOSEC) 40 MG capsule, Take 40 mg by mouth daily., Disp: , Rfl:    terbinafine (LAMISIL) 250 MG tablet, Take 1 tablet (250 mg total) by mouth daily., Disp: 90 tablet, Rfl: 0   traZODone (DESYREL) 50 MG tablet, Take 1 tablet (50 mg total) by mouth at bedtime as needed for sleep., Disp: 30 tablet, Rfl: 0

## 2023-10-22 NOTE — Progress Notes (Signed)
Synopsis: Referred in December 2024 for restrictive lung disease by Dickie La, MD  Subjective:   [name] GENDER: female DOB: Oct 18, 1961, MRN: [id number]  HPI  Chief Complaint  Patient presents with   Shortness of Breath   [name] is a 62 year old woman, daily smoker with history of hypertension, DVT/PE, alchohols abuse, cardiomyopathy and V. Fib arrest in 2017 who is referred to pulmonary clinic for evaluation of restrictive lung disease.   PFTs 05/07/23 show mild restriction TLC 3.24L (64%) and moderate diffusion defect (52%). FEV1 1.42L (56%) and FVC 1.85L (56%).   HRCT 10/08/23 showed patchy areas of very mild ground-glass attenuation scattered throughout the lungs bilaterally.   She has exertional shortness of breath. She coughs up mucous every morning. She has intermittent wheezing. She is using albuterol as needed. Has not been on maintenance inhaler before. They also report a morning cough, producing pink and brown sputum. They have no known family history of COPD or lung cancer, but their mother had rheumatoid arthritis. They also experience occasional sinus congestion and drainage, and have a history of acid reflux, for which they take omeprazole.  She quit smoking a couple weeks ago. She was smoking half a pack, started smoking at age 68. She is using patches 14mg  daily. Her daughter smokes in the house upstairs. Her father smoked around her in childhood.   Past Medical History:  Diagnosis Date   Alcoholism (HCC)    ANEMIA, VITAMIN B12 DEFICIENCY 11/11/2007   Documented low 175 on 09/2007; received 6 yrs of IM therapy; switched to po 05/2013    Anxiety    Breast pain, left 06/02/2021   Cardiac arrest (HCC) 01/16/2013   witnessed, shockable rhythm, initally low EF, no CAD at cath. Drug screen negative.   Chest pain    a. 04/2009 Echo: EF 55-60%, Gr1 DD, Mild MR.   Depression    Duodenitis    secondary to H pylori(treatment completed 05/2006), +/-  NSAIDS   Duodenitis    a. 05/2006 2/2 H pylori +/- NSAIDS   DVT (deep venous thrombosis) (HCC)    a. 04/2009 - treated with coumadin x 8 mos.   ETOH abuse    Heart murmur    History of cocaine abuse (HCC)    History of CVA (cerebrovascular accident)    Hypertension    Left arm pain 06/11/2017   PE 05/16/2009   CT angio positive for PE in 07/10, treated with coumadin for 8 months.    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 04/2009 - treated with coumadin x 8 mos.   Rectal bleeding 2007   internal hemmorhoids by colonoscopy in 04/2006, Bx neg for IBD   Rectal bleeding    a. int hemorrhoids by colonoscopy in 04/2006, Bx neg for IBD   Right foot pain 05/05/2020   Seizures (HCC)    Shortness of breath 05/28/2020   Stroke (HCC)    Tobacco abuse    Trochanteric bursitis of left hip 05/22/2016     Family History  Problem Relation Age of Onset   Cancer Mother    Breast cancer Mother 36   Heart disease Mother    Diabetes Mother    CAD Mother    Heart attack Mother    Hypertension Mother    Cancer Father    Heart disease Father    Colon cancer Father    CAD Father    Cancer Sister  Breast cancer Sister 58   Diabetes Sister    Hypertension Sister    Heart disease Brother    Diabetes Brother    CAD Brother    Breast cancer Niece 79   Coronary artery disease Other        1 st degree female relative <60 and female <50   Diabetes type II Other        1 st degree relative   Colon polyps Neg Hx    Esophageal cancer Neg Hx    Rectal cancer Neg Hx    Stomach cancer Neg Hx      Social History   Socioeconomic History   Marital status: Single    Spouse name: Not on file   Number of children: 2   Years of education: Not on file   Highest education level: 11th grade  Occupational History   Occupation: Unemployed  Tobacco Use   Smoking status: Some Days    Current packs/day: 0.10    Types: Cigarettes   Smokeless tobacco: Never   Tobacco comments:    smokes about 1-2  cigs/day.  Has patches   Vaping Use   Vaping status: Never Used  Substance and Sexual Activity   Alcohol use: Yes    Comment: beer   Drug use: Yes    Types: Marijuana   Sexual activity: Yes    Partners: Male    Birth control/protection: Post-menopausal  Other Topics Concern   Not on file  Social History Narrative   ** Merged History Encounter **       ** Data from: 12/05/12 Enc Dept: [facility]   No cocaine or alcohol use since hospital discharge 05/19/2009.      10/13 update:  States she is smoking 1-2 cigarettes daily with at least 1 drink daily usually Brandy.                  ** Data from: 01/17/13 Enc Dept: [facility]   Pt lives in [place] with a roommate.      Social Drivers of Corporate investment banker Strain: Low Risk  (03/14/2023)   Overall Financial Resource Strain (CARDIA)    Difficulty of Paying Living Expenses: Not hard at all  Food Insecurity: No Food Insecurity (03/14/2023)   Hunger Vital Sign    Worried About Running Out of Food in the Last Year: Never true    Ran Out of Food in the Last Year: Never true  Transportation Needs: Unmet Transportation Needs (03/14/2023)   PRAPARE - Transportation    Lack of Transportation (Medical): Yes    Lack of Transportation (Non-Medical): Yes  Physical Activity: Sufficiently Active (03/14/2023)   Exercise Vital Sign    Days of Exercise per Week: 7 days    Minutes of Exercise per Session: 30 min  Stress: No Stress Concern Present (03/14/2023)   [employer] of Occupational Health - Occupational Stress Questionnaire    Feeling of Stress : Not at all  Social Connections: Moderately Isolated (03/14/2023)   Social Connection and Isolation Panel [NHANES]    Frequency of Communication with Friends and Family: More than three times a week    Frequency of Social Gatherings with Friends and Family: Once a week    Attends Religious Services: More than 4 times per year    Active Member of [employer] or Organizations:  No    Attends Banker Meetings: Never    Marital Status: Never married  Catering manager  Violence: Not At Risk (03/14/2023)   Humiliation, Afraid, Rape, and Kick questionnaire    Fear of Current or Ex-Partner: No    Emotionally Abused: No    Physically Abused: No    Sexually Abused: No     Allergies  Allergen Reactions   Iodinated Contrast Media Other (See Comments)    Seizure   Omnipaque [Iohexol] Other (See Comments)    Seizure      Outpatient Medications Prior to Visit  Medication Sig Dispense Refill   Acetaminophen Extra Strength 500 MG CAPS Take 1 capsule by mouth 3 (three) times daily.     albuterol (VENTOLIN HFA) 108 (90 Base) MCG/ACT inhaler Inhale 2 puffs into the lungs every 4 (four) hours as needed for wheezing or shortness of breath. 6.7 g 0   aspirin EC 81 MG tablet Take 1 tablet (81 mg total) by mouth daily. Swallow whole. 150 tablet 2   atorvastatin (LIPITOR) 40 MG tablet Take 1 tablet (40 mg total) by mouth daily. 90 tablet 3   baclofen (LIORESAL) 10 MG tablet Take 1 tablet (10 mg total) by mouth 3 (three) times daily. 30 each 0   carvedilol (COREG) 6.25 MG tablet Take 1 tablet (6.25 mg total) by mouth 2 (two) times daily. 180 tablet 3   Cholecalciferol 25 MCG (1000 UT) CHEW Chew 1 tablet (1,000 Units total) by mouth daily. 90 tablet 0   diclofenac Sodium (VOLTAREN) 1 % GEL Apply 2 g topically 4 (four) times daily. 100 g 0   gabapentin (NEURONTIN) 300 MG capsule Take 1 capsule (300 mg total) by mouth 3 (three) times daily. 270 capsule 3   nitroGLYCERIN (NITROSTAT) 0.4 MG SL tablet Place 1 tablet (0.4 mg total) under the tongue every 5 (five) minutes as needed for chest pain. 25 tablet 1   terbinafine (LAMISIL) 250 MG tablet Take 1 tablet (250 mg total) by mouth daily. 90 tablet 0   omeprazole (PRILOSEC) 40 MG capsule Take 40 mg by mouth daily.     traZODone (DESYREL) 50 MG tablet Take 1 tablet (50 mg total) by mouth at bedtime as needed for sleep. (Patient  not taking: Reported on 10/22/2023) 30 tablet 0   No facility-administered medications prior to visit.    Review of Systems  Constitutional:  Negative for chills, fever, malaise/fatigue and weight loss.  HENT:  Negative for congestion, sinus pain and sore throat.   Eyes: Negative.   Respiratory:  Positive for cough, sputum production, shortness of breath and wheezing. Negative for hemoptysis.   Cardiovascular:  Negative for chest pain, palpitations, orthopnea, claudication and leg swelling.  Gastrointestinal:  Positive for heartburn. Negative for abdominal pain, nausea and vomiting.  Genitourinary: Negative.   Musculoskeletal:  Negative for joint pain and myalgias.  Skin:  Negative for rash.  Neurological:  Negative for weakness.  Endo/Heme/Allergies: Negative.   Psychiatric/Behavioral: Negative.      Objective:   Vitals:   10/22/23 0920  BP: 127/77  Pulse: 75  Temp: 98 F (36.7 C)  TempSrc: Oral  SpO2: 98%  Weight: 145 lb 3.2 oz (65.9 kg)  Height: 5\' 4"  (1.626 m)   Physical Exam Constitutional:      General: She is not in acute distress.    Appearance: Normal appearance.  Eyes:     General: No scleral icterus.    Conjunctiva/sclera: Conjunctivae normal.  Cardiovascular:     Rate and Rhythm: Normal rate and regular rhythm.  Pulmonary:     Breath sounds: No wheezing, rhonchi  or rales.  Musculoskeletal:     Right lower leg: No edema.     Left lower leg: No edema.  Skin:    General: Skin is warm and dry.  Neurological:     General: No focal deficit present.    CBC    Component Value Date/Time   WBC 9.7 06/05/2023 1037   WBC 7.6 03/07/2023 0048   RBC 4.20 06/05/2023 1037   RBC 3.88 03/07/2023 0048   HGB 11.9 06/05/2023 1037   HCT 36.5 06/05/2023 1037   PLT 288 06/05/2023 1037   MCV 87 06/05/2023 1037   MCH 28.3 06/05/2023 1037   MCH 28.4 03/07/2023 0048   MCHC 32.6 06/05/2023 1037   MCHC 32.1 03/07/2023 0048   RDW 12.8 06/05/2023 1037   LYMPHSABS 2.5  03/07/2023 0048   LYMPHSABS 2.7 05/05/2020 1651   MONOABS 0.9 03/07/2023 0048   EOSABS 0.4 03/07/2023 0048   EOSABS 0.1 05/05/2020 1651   BASOSABS 0.0 03/07/2023 0048   BASOSABS 0.0 05/05/2020 1651      Latest Ref Rng & Units 03/07/2023   12:48 AM 03/29/2022    3:16 PM 05/05/2020    4:51 PM  BMP  Glucose 70 - 99 mg/dL 010  272  91   BUN 6 - 20 mg/dL 11  9  8    Creatinine 0.44 - 1.00 mg/dL 5.36  6.44  0.34   BUN/Creat Ratio 9 - 23   13   Sodium 135 - 145 mmol/L 136  141  141   Potassium 3.5 - 5.1 mmol/L 3.8  3.9  3.9   Chloride 98 - 111 mmol/L 105  109  105   CO2 22 - 32 mmol/L 22  26  24    Calcium 8.9 - 10.3 mg/dL 8.4  9.1  9.0    Chest imaging: HRCT Chest 10/08/23 Mediastinum/Nodes: No pathologically enlarged mediastinal or hilar lymph nodes. Please note that accurate exclusion of hilar adenopathy is limited on noncontrast CT scans. Esophagus is unremarkable in appearance. No axillary lymphadenopathy.   Lungs/Pleura: High-resolution images demonstrate some patchy areas of very mild ground-glass attenuation scattered throughout the lungs bilaterally. No substantial septal thickening, subpleural reticulation, traction bronchiectasis or honeycombing. Inspiratory and expiratory imaging is unremarkable. No acute consolidative airspace disease. No pleural effusions. No definite suspicious appearing pulmonary nodules or masses are noted.  PFT:    Latest Ref Rng & Units 05/07/2023   10:25 AM  PFT Results  FVC-Pre L 1.90   FVC-Predicted Pre % 57   FVC-Post L 1.85   FVC-Predicted Post % 56   Pre FEV1/FVC % % 79   Post FEV1/FCV % % 77   FEV1-Pre L 1.50   FEV1-Predicted Pre % 59   FEV1-Post L 1.42   DLCO uncorrected ml/min/mmHg 10.63   DLCO UNC% % 52   DLVA Predicted % 93   TLC L 3.24   TLC % Predicted % 64   RV % Predicted % 61     Labs:  Path:  Echo 10/18/23: LV EF 55-60%, Grade I diastolic dysfunction. RV size and function are normal. LA moderately dilated. RA mildly  dilated.   Heart Catheterization:     Assessment & Plan:   Restrictive lung disease  Decreased diffusion capacity of lung  Discussion: [name] is a 62 year old woman, daily smoker with history of hypertension, DVT/PE, alchohols abuse, cardiomyopathy and V. Fib arrest in 2017 who is referred to pulmonary clinic for evaluation of restrictive lung disease.  Restrictive Lung  Disease Shortness of breath with exertion and morning cough with sputum production. Recent quit smoking. Secondhand smoke exposure at home. Albuterol provides relief. 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 No autoimmune features at this time to warrant inflammatory workup. -Start Trelegy inhaler, one puff daily. -Continue Albuterol as needed. -Return in 4 months for follow-up.  Nicotine Dependence Recently quit smoking, using nicotine patches (14mg  daily) and hard candy for cravings. Lives with a smoker. -Continue nicotine patches as needed. -Consider nicotine lozenges for breakthrough cravings. -Encourage daughter to smoke outside the home.  Gastroesophageal Reflux Disease (GERD) Reports acid reflux, currently out of omeprazole. -Refill omeprazole prescription.  Lung Cancer Screening History of long-term smoking, qualifies for annual lung cancer screening. Recent CT scan did not show any abnormal spots. -Schedule next CT scan for lung cancer screening in one year.  General Health Maintenance / Followup Plans -Continue current smoking cessation efforts. -Return in 4 months for follow-up on COPD and smoking cessation progress.  Melody Comas, MD [facility] Office: [phone]    Current Outpatient Medications:    Acetaminophen Extra Strength 500 MG CAPS, Take 1 capsule by mouth 3 (three) times daily., Disp: , Rfl:    albuterol (VENTOLIN HFA) 108 (90 Base) MCG/ACT inhaler, Inhale 2 puffs into the lungs every 4 (four) hours as  needed for wheezing or shortness of breath., Disp: 6.7 g, Rfl: 0   aspirin EC 81 MG tablet, Take 1 tablet (81 mg total) by mouth daily. Swallow whole., Disp: 150 tablet, Rfl: 2   atorvastatin (LIPITOR) 40 MG tablet, Take 1 tablet (40 mg total) by mouth daily., Disp: 90 tablet, Rfl: 3   baclofen (LIORESAL) 10 MG tablet, Take 1 tablet (10 mg total) by mouth 3 (three) times daily., Disp: 30 each, Rfl: 0   carvedilol (COREG) 6.25 MG tablet, Take 1 tablet (6.25 mg total) by mouth 2 (two) times daily., Disp: 180 tablet, Rfl: 3   Cholecalciferol 25 MCG (1000 UT) CHEW, Chew 1 tablet (1,000 Units total) by mouth daily., Disp: 90 tablet, Rfl: 0   diclofenac Sodium (VOLTAREN) 1 % GEL, Apply 2 g topically 4 (four) times daily., Disp: 100 g, Rfl: 0   Fluticasone-Umeclidin-Vilant (TRELEGY ELLIPTA) 100-62.5-25 MCG/ACT AEPB, Inhale 1 puff into the lungs daily., Disp: , Rfl:    gabapentin (NEURONTIN) 300 MG capsule, Take 1 capsule (300 mg total) by mouth 3 (three) times daily., Disp: 270 capsule, Rfl: 3   nitroGLYCERIN (NITROSTAT) 0.4 MG SL tablet, Place 1 tablet (0.4 mg total) under the tongue every 5 (five) minutes as needed for chest pain., Disp: 25 tablet, Rfl: 1   terbinafine (LAMISIL) 250 MG tablet, Take 1 tablet (250 mg total) by mouth daily., Disp: 90 tablet, Rfl: 0   omeprazole (PRILOSEC) 40 MG capsule, Take 40 mg by mouth daily., Disp: , Rfl:    traZODone (DESYREL) 50 MG tablet, Take 1 tablet (50 mg total) by mouth at bedtime as needed for sleep. (Patient not taking: Reported on 10/22/2023), Disp: 30 tablet, Rfl: 0

## 2023-10-22 NOTE — Patient Instructions (Addendum)
Try trelegy ellipta 100, 1 puff daily - rinse mouth out after each use - If you want to continue on this inhaler, please call us for a prescription  Continue to use albuterol inhaler 1-2 puffs as needed  I am proud of you for quitting smoking. Continue nicotine patches 14mg  daily and consider getting mini nicotine lozenges 2mg  to take as needed.   Follow up in 4 months, call sooner if needed

## 2023-10-26 ENCOUNTER — Telehealth (HOSPITAL_COMMUNITY): Payer: Self-pay | Admitting: Licensed Clinical Social Worker

## 2023-10-26 ENCOUNTER — Ambulatory Visit (HOSPITAL_COMMUNITY): Payer: MEDICAID

## 2023-10-26 ENCOUNTER — Encounter (HOSPITAL_COMMUNITY): Payer: Self-pay

## 2023-10-26 NOTE — Telephone Encounter (Signed)
The therapist calls [name] confirming her identity via two identifiers. She says that she woke up late but plans on being in group on Monday. As she says that she does not have the direct contact number for this therapist, he provides it to her so she can call if she is unable to make group for some reason in the future.   Myrna Blazer, MA, LCSW, [facility], LCAS 10/26/2023

## 2023-10-29 ENCOUNTER — Telehealth (HOSPITAL_COMMUNITY): Payer: Self-pay | Admitting: Licensed Clinical Social Worker

## 2023-10-29 ENCOUNTER — Ambulatory Visit (HOSPITAL_COMMUNITY): Payer: MEDICAID

## 2023-10-29 ENCOUNTER — Encounter (HOSPITAL_COMMUNITY): Payer: Self-pay

## 2023-10-29 NOTE — Telephone Encounter (Signed)
[name] leaves a voicemail saying that she again overslept for group.   Myrna Blazer, MA, LCSW, [facility], LCAS 10/29/2023

## 2023-11-02 ENCOUNTER — Encounter (HOSPITAL_COMMUNITY): Payer: Self-pay

## 2023-11-02 ENCOUNTER — Telehealth (HOSPITAL_COMMUNITY): Payer: Self-pay | Admitting: Licensed Clinical Social Worker

## 2023-11-02 ENCOUNTER — Ambulatory Visit (HOSPITAL_COMMUNITY): Payer: MEDICAID

## 2023-11-02 DIAGNOSIS — F1220 Cannabis dependence, uncomplicated: Secondary | ICD-10-CM

## 2023-11-02 DIAGNOSIS — Z91199 Patient's noncompliance with other medical treatment and regimen due to unspecified reason: Secondary | ICD-10-CM

## 2023-11-02 DIAGNOSIS — F4321 Adjustment disorder with depressed mood: Secondary | ICD-10-CM

## 2023-11-02 DIAGNOSIS — F4310 Post-traumatic stress disorder, unspecified: Secondary | ICD-10-CM

## 2023-11-02 DIAGNOSIS — F1420 Cocaine dependence, uncomplicated: Secondary | ICD-10-CM

## 2023-11-02 DIAGNOSIS — G629 Polyneuropathy, unspecified: Secondary | ICD-10-CM

## 2023-11-02 DIAGNOSIS — F1994 Other psychoactive substance use, unspecified with psychoactive substance-induced mood disorder: Secondary | ICD-10-CM

## 2023-11-02 DIAGNOSIS — F17210 Nicotine dependence, cigarettes, uncomplicated: Secondary | ICD-10-CM

## 2023-11-02 DIAGNOSIS — Z6372 Alcoholism and drug addiction in family: Secondary | ICD-10-CM

## 2023-11-02 DIAGNOSIS — T7491XS Unspecified adult maltreatment, confirmed, sequela: Secondary | ICD-10-CM

## 2023-11-02 DIAGNOSIS — F1020 Alcohol dependence, uncomplicated: Secondary | ICD-10-CM

## 2023-11-02 DIAGNOSIS — T7422XS Child sexual abuse, confirmed, sequela: Secondary | ICD-10-CM

## 2023-11-02 DIAGNOSIS — F1920 Other psychoactive substance dependence, uncomplicated: Secondary | ICD-10-CM

## 2023-11-02 NOTE — Telephone Encounter (Signed)
 The therapist attempts to reach [name] leaving a HIPAA-compliant message with a woman who says [name] is not in right now.  Myrna Blazer, MA, LCSW, [facility], LCAS 11/02/2023

## 2023-11-05 ENCOUNTER — Ambulatory Visit (HOSPITAL_COMMUNITY): Payer: MEDICAID

## 2023-11-07 ENCOUNTER — Ambulatory Visit (HOSPITAL_COMMUNITY): Payer: MEDICAID

## 2023-11-09 ENCOUNTER — Ambulatory Visit (HOSPITAL_COMMUNITY): Payer: MEDICAID
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
## 2023-11-12 ENCOUNTER — Ambulatory Visit (HOSPITAL_COMMUNITY): Payer: MEDICAID

## 2023-11-14 ENCOUNTER — Telehealth: Payer: Self-pay | Admitting: Internal Medicine

## 2023-11-14 ENCOUNTER — Ambulatory Visit (HOSPITAL_COMMUNITY): Payer: MEDICAID

## 2023-11-14 DIAGNOSIS — R072 Precordial pain: Secondary | ICD-10-CM

## 2023-11-14 DIAGNOSIS — R002 Palpitations: Secondary | ICD-10-CM

## 2023-11-14 DIAGNOSIS — I340 Nonrheumatic mitral (valve) insufficiency: Secondary | ICD-10-CM

## 2023-11-14 NOTE — Telephone Encounter (Signed)
 Pt is requesting a callback regarding results. Please advise

## 2023-11-14 NOTE — Telephone Encounter (Signed)
 The patient has been notified of the result and verbalized understanding.  All questions (if any) were answered. Christine Cozier, RN 11/14/2023 4:38 PM   Cardiac PET Scan instructions reviewed with pt over the phone.  Instructions printed and mailed out to pt.

## 2023-11-14 NOTE — Telephone Encounter (Signed)
-----   Message from Jann Melody sent at 10/21/2023  2:15 PM EST ----- Results: Moderate eccentric MR despite LV recovery Plan: This likely doesn't explain chest pain, PET MPI study as we discussed 10/15/23  Jann Melody, MD

## 2023-11-14 NOTE — Addendum Note (Signed)
 Addended by: Suellyn Emory on: 11/14/2023 04:26 PM   Modules accepted: Level of Service

## 2023-11-16 ENCOUNTER — Ambulatory Visit (HOSPITAL_COMMUNITY): Payer: MEDICAID

## 2023-12-24 ENCOUNTER — Telehealth: Payer: Self-pay | Admitting: Neurology

## 2023-12-24 NOTE — Telephone Encounter (Signed)
 rs appointment

## 2023-12-25 ENCOUNTER — Ambulatory Visit: Payer: MEDICAID | Admitting: Neurology

## 2024-01-02 ENCOUNTER — Encounter: Payer: Self-pay | Admitting: Student

## 2024-01-02 ENCOUNTER — Ambulatory Visit: Payer: MEDICAID | Admitting: Student

## 2024-01-02 VITALS — BP 116/60 | HR 64 | Temp 97.8°F | Ht 64.0 in | Wt 141.5 lb

## 2024-01-02 DIAGNOSIS — I428 Other cardiomyopathies: Secondary | ICD-10-CM

## 2024-01-02 DIAGNOSIS — K219 Gastro-esophageal reflux disease without esophagitis: Secondary | ICD-10-CM | POA: Diagnosis not present

## 2024-01-02 DIAGNOSIS — I739 Peripheral vascular disease, unspecified: Secondary | ICD-10-CM

## 2024-01-02 DIAGNOSIS — R059 Cough, unspecified: Secondary | ICD-10-CM | POA: Diagnosis not present

## 2024-01-02 DIAGNOSIS — I509 Heart failure, unspecified: Secondary | ICD-10-CM

## 2024-01-02 DIAGNOSIS — R1310 Dysphagia, unspecified: Secondary | ICD-10-CM | POA: Diagnosis not present

## 2024-01-02 DIAGNOSIS — R053 Chronic cough: Secondary | ICD-10-CM

## 2024-01-02 DIAGNOSIS — I10 Essential (primary) hypertension: Secondary | ICD-10-CM

## 2024-01-02 MED ORDER — ASPIRIN 81 MG PO TBEC: 81.0000 mg | DELAYED_RELEASE_TABLET | Freq: Every day | ORAL | 3 refills | Status: DC

## 2024-01-02 MED ORDER — NITROGLYCERIN 0.4 MG SL SUBL: 0.4000 mg | SUBLINGUAL_TABLET | SUBLINGUAL | 1 refills | Status: AC | PRN

## 2024-01-02 MED ORDER — OMEPRAZOLE MAGNESIUM 20 MG PO TBEC: 20.0000 mg | DELAYED_RELEASE_TABLET | Freq: Every day | ORAL | 2 refills | Status: DC

## 2024-01-02 MED ORDER — CARVEDILOL 6.25 MG PO TABS: 6.2500 mg | ORAL_TABLET | Freq: Two times a day (BID) | ORAL | 3 refills | Status: DC

## 2024-01-02 MED ORDER — ATORVASTATIN CALCIUM 40 MG PO TABS: 40.0000 mg | ORAL_TABLET | Freq: Every day | ORAL | 3 refills | Status: DC

## 2024-01-02 MED ORDER — ALBUTEROL SULFATE HFA 108 (90 BASE) MCG/ACT IN AERS
2.0000 | INHALATION_SPRAY | RESPIRATORY_TRACT | 0 refills | Status: DC | PRN
Start: 2024-01-02 — End: 2024-05-26

## 2024-01-02 NOTE — Assessment & Plan Note (Addendum)
 Chronically stable on baby aspirin and Lipitor. -Refilled aspirin and Lipitor

## 2024-01-02 NOTE — Progress Notes (Signed)
 CC: Worsening acid reflux and pain with swallowing  HPI:  [name] is a 63 y.o. female living with a history stated below and presents today for worsening acid reflux and pain with swallowing. Please see problem based assessment and plan for additional details.  Past Medical History:  Diagnosis Date   Alcoholism (HCC)    ANEMIA, VITAMIN B12 DEFICIENCY 11/11/2007   Documented low 175 on 09/2007; received 6 yrs of IM therapy; switched to po 05/2013    Anxiety    Breast pain, left 06/02/2021   Cardiac arrest (HCC) 01/16/2013   witnessed, shockable rhythm, initally low EF, no CAD at cath. Drug screen negative.   Chest pain    a. 04/2009 Echo: EF 55-60%, Gr1 DD, Mild MR.   Depression    Duodenitis    secondary to H pylori(treatment completed 05/2006), +/- NSAIDS   Duodenitis    a. 05/2006 2/2 H pylori +/- NSAIDS   DVT (deep venous thrombosis) (HCC)    a. 04/2009 - treated with coumadin x 8 mos.   ETOH abuse    Heart murmur    History of cocaine abuse (HCC)    History of CVA (cerebrovascular accident)    Hypertension    Left arm pain 06/11/2017   PE 05/16/2009   CT angio positive for PE in 07/10, treated with coumadin for 8 months.    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 04/2009 - treated with coumadin x 8 mos.   Rectal bleeding 2007   internal hemmorhoids by colonoscopy in 04/2006, Bx neg for IBD   Rectal bleeding    a. int hemorrhoids by colonoscopy in 04/2006, Bx neg for IBD   Right foot pain 05/05/2020   Seizures (HCC)    Shortness of breath 05/28/2020   Stroke (HCC)    Tobacco abuse    Trochanteric bursitis of left hip 05/22/2016    Current Outpatient Medications on File Prior to Visit  Medication Sig Dispense Refill   Acetaminophen Extra Strength 500 MG CAPS Take 1 capsule by mouth 3 (three) times daily.     baclofen (LIORESAL) 10 MG tablet Take 1 tablet (10 mg total) by mouth 3 (three) times daily. 30 each 0   Cholecalciferol 25 MCG (1000 UT) CHEW Chew  1 tablet (1,000 Units total) by mouth daily. 90 tablet 0   diclofenac Sodium (VOLTAREN) 1 % GEL Apply 2 g topically 4 (four) times daily. 100 g 0   Fluticasone-Umeclidin-Vilant (TRELEGY ELLIPTA) 100-62.5-25 MCG/ACT AEPB Inhale 1 puff into the lungs daily.     gabapentin (NEURONTIN) 300 MG capsule Take 1 capsule (300 mg total) by mouth 3 (three) times daily. 270 capsule 3   terbinafine (LAMISIL) 250 MG tablet Take 1 tablet (250 mg total) by mouth daily. 90 tablet 0   traZODone (DESYREL) 50 MG tablet Take 1 tablet (50 mg total) by mouth at bedtime as needed for sleep. (Patient not taking: Reported on 10/22/2023) 30 tablet 0   No current facility-administered medications on file prior to visit.    Family History  Problem Relation Age of Onset   Cancer Mother    Breast cancer Mother 38   Heart disease Mother    Diabetes Mother    CAD Mother    Heart attack Mother    Hypertension Mother    Cancer Father    Heart disease Father    Colon cancer Father    CAD Father    Cancer Sister  Breast cancer Sister 33   Diabetes Sister    Hypertension Sister    Heart disease Brother    Diabetes Brother    CAD Brother    Breast cancer Niece 17   Coronary artery disease Other        1 st degree female relative <60 and female <50   Diabetes type II Other        1 st degree relative   Colon polyps Neg Hx    Esophageal cancer Neg Hx    Rectal cancer Neg Hx    Stomach cancer Neg Hx     Social History   Socioeconomic History   Marital status: Single    Spouse name: Not on file   Number of children: 2   Years of education: Not on file   Highest education level: 11th grade  Occupational History   Occupation: Unemployed  Tobacco Use   Smoking status: Some Days    Current packs/day: 0.10    Types: Cigarettes   Smokeless tobacco: Never   Tobacco comments:    smokes about 1-2 cigs/day.  Has patches   Vaping Use   Vaping status: Never Used  Substance and Sexual Activity   Alcohol use: Yes     Comment: beer   Drug use: Yes    Types: Marijuana   Sexual activity: Yes    Partners: Male    Birth control/protection: Post-menopausal  Other Topics Concern   Not on file  Social History Narrative   ** Merged History Encounter **       ** Data from: 12/05/12 Enc Dept: [facility]   No cocaine or alcohol use since hospital discharge 05/19/2009.      10/13 update:  States she is smoking 1-2 cigarettes daily with at least 1 drink daily usually Brandy.                  ** Data from: 01/17/13 Enc Dept: [facility]   Pt lives in [place] with a roommate.      Social Drivers of Corporate investment banker Strain: Low Risk  (03/14/2023)   Overall Financial Resource Strain (CARDIA)    Difficulty of Paying Living Expenses: Not hard at all  Food Insecurity: No Food Insecurity (03/14/2023)   Hunger Vital Sign    Worried About Running Out of Food in the Last Year: Never true    Ran Out of Food in the Last Year: Never true  Transportation Needs: Unmet Transportation Needs (03/14/2023)   PRAPARE - Transportation    Lack of Transportation (Medical): Yes    Lack of Transportation (Non-Medical): Yes  Physical Activity: Sufficiently Active (03/14/2023)   Exercise Vital Sign    Days of Exercise per Week: 7 days    Minutes of Exercise per Session: 30 min  Stress: No Stress Concern Present (03/14/2023)   [employer] of Occupational Health - Occupational Stress Questionnaire    Feeling of Stress : Not at all  Social Connections: Moderately Isolated (03/14/2023)   Social Connection and Isolation Panel [NHANES]    Frequency of Communication with Friends and Family: More than three times a week    Frequency of Social Gatherings with Friends and Family: Once a week    Attends Religious Services: More than 4 times per year    Active Member of [employer] or Organizations: No    Attends Banker Meetings: Never    Marital Status: Never married  Intimate Partner Violence: Not  At Risk (03/14/2023)   Humiliation, Afraid, Rape, and Kick questionnaire    Fear of Current or Ex-Partner: No    Emotionally Abused: No    Physically Abused: No    Sexually Abused: No    Review of Systems: ROS negative except for what is noted on the assessment and plan.  Vitals:   01/02/24 0909  BP: 116/60  Pulse: 64  Temp: 97.8 F (36.6 C)  TempSrc: Oral  SpO2: 99%  Weight: 141 lb 8 oz (64.2 kg)  Height: 5\' 4"  (1.626 m)    Physical Exam: Constitutional: well-appearing woman, sitting in chair, in no acute distress HENT: normocephalic atraumatic, mucous membranes moist.  No masses or lymphadenopathy appreciated on neck palpation.  No warmth or erythema and no obvious signs of trauma Cardiovascular: regular rate and rhythm, no m/r/g Pulmonary/Chest: normal work of breathing on room air, lungs clear to auscultation bilaterally Abdominal: soft, non-tender, non-distended MSK: normal bulk and tone Neurological: alert & oriented x 3, no focal deficit Skin: warm and dry Psych: normal mood and behavior  Assessment & Plan:   Dysphagia Ms. Encarnacion is a 63 year old female with a history of tobacco use disorder, heart failure, PAD, and GERD who presented to me in the office today due to concern for pain with swallowing that has been going on for the past 4 days.  Denies any issue with drinking  fluids but says she feels the pain when she eats solid food.  Patient denies any recent weight loss night sweats, or any fever, chest pain, shortness of breath during this time.  Patient denies any hematemesis.  On exam, I did not appreciate any lymphadenopathy or masses around the neck area.  In the setting of this acute dysphagia with no red flags, I do not think this patient has a malignancy at this time.  I also wonder if her uncontrolled GERD is causing her dysphagia.  Will send patient to GI for an upper endoscopy to further assess what is causing her dysphagia. -Ambulatory referral to  gastroenterology  GERD (gastroesophageal reflux disease) Patient reported worsening of her acid reflux for the past few weeks.  Per chart review she has not refilled her omeprazole since 2024.  I suspect that her worsening acid reflux is due to inadequate medical therapy with a component of dietary choices.  Patient is counseled on eliminating food that makes the acid reflux worse.  Refill her omeprazole -Refill omeprazole 20 mg daily  Heart failure, type unknown (HCC) Stable on Coreg.  Patient does not endorse any recent orthopnea or dyspnea.  On exam, patient is warm and dry and no signs of volume overload at this time.  I do not think this patient is an exacerbation at this time. -Continue on Coreg 6.25 mg  PAD (peripheral artery disease) (HCC) Chronically stable on baby aspirin and Lipitor. -Refilled aspirin and Lipitor  Cough Refilled albuterol     Patient discussed with Dr. Dale Edgewood, M.D [facility] Phone: [phone] Date 01/02/2024 Time 10:17 AM

## 2024-01-02 NOTE — Assessment & Plan Note (Addendum)
 Patient reported worsening of her acid reflux for the past few weeks.  Per chart review she has not refilled her omeprazole since 2024.  I suspect that her worsening acid reflux is due to inadequate medical therapy with a component of dietary choices.  Patient is counseled on eliminating food that makes the acid reflux worse.  Refill her omeprazole -Refill omeprazole 20 mg daily

## 2024-01-02 NOTE — Assessment & Plan Note (Signed)
 Stable on Coreg.  Patient does not endorse any recent orthopnea or dyspnea.  On exam, patient is warm and dry and no signs of volume overload at this time.  I do not think this patient is an exacerbation at this time. -Continue on Coreg 6.25 mg

## 2024-01-02 NOTE — Assessment & Plan Note (Addendum)
 [name] is a 63 year old female with a history of tobacco use disorder, heart failure, PAD, and GERD who presented to me in the office today due to concern for pain with swallowing that has been going on for the past 4 days.  Denies any issue with drinking  fluids but says she feels the pain when she eats solid food.  Patient denies any recent weight loss night sweats, or any fever, chest pain, shortness of breath during this time.  Patient denies any hematemesis.  On exam, I did not appreciate any lymphadenopathy or masses around the neck area.  In the setting of this acute dysphagia with no red flags, I do not think this patient has a malignancy at this time.  I also wonder if her uncontrolled GERD is causing her dysphagia.  Will send patient to GI for an upper endoscopy to further assess what is causing her dysphagia. -Ambulatory referral to gastroenterology

## 2024-01-02 NOTE — Assessment & Plan Note (Signed)
 Refilled albuterol

## 2024-01-02 NOTE — Patient Instructions (Addendum)
 Thank you, Ms.Bryttany D Robillard for allowing [place] to provide your care today. Today we discussed your pain with swallowing and also discuss your other chronic conditions.  Refilling all of your requested medications.  I am also referring you to GI for an upper endoscopy to further assess what is causing your pain with swallowing.  For your mood disorder I recommend that you continue following up with [facility] and other referral services that she is sensitive.  I have ordered the following labs for you:  Lab Orders  No laboratory test(s) ordered today     Tests ordered today:    Referrals ordered today:   Referral Orders         Ambulatory referral to Gastroenterology       I have ordered the following medication/changed the following medications:   Stop the following medications: Medications Discontinued During This Encounter  Medication Reason   nitroGLYCERIN (NITROSTAT) 0.4 MG SL tablet Reorder   carvedilol (COREG) 6.25 MG tablet Reorder   aspirin EC 81 MG tablet Reorder   albuterol (VENTOLIN HFA) 108 (90 Base) MCG/ACT inhaler Reorder   atorvastatin (LIPITOR) 40 MG tablet Reorder     Start the following medications: Meds ordered this encounter  Medications   albuterol (VENTOLIN HFA) 108 (90 Base) MCG/ACT inhaler    Sig: Inhale 2 puffs into the lungs every 4 (four) hours as needed for wheezing or shortness of breath.    Dispense:  6.7 g    Refill:  0   aspirin EC 81 MG tablet    Sig: Take 1 tablet (81 mg total) by mouth daily. Swallow whole.    Dispense:  90 tablet    Refill:  3   carvedilol (COREG) 6.25 MG tablet    Sig: Take 1 tablet (6.25 mg total) by mouth 2 (two) times daily.    Dispense:  180 tablet    Refill:  3    IM Program   atorvastatin (LIPITOR) 40 MG tablet    Sig: Take 1 tablet (40 mg total) by mouth daily.    Dispense:  90 tablet    Refill:  3    IM Program   nitroGLYCERIN (NITROSTAT) 0.4 MG SL tablet    Sig: Place 1 tablet (0.4 mg total) under the  tongue every 5 (five) minutes as needed for chest pain.    Dispense:  25 tablet    Refill:  1    IM Program   omeprazole (PRILOSEC OTC) 20 MG tablet    Sig: Take 1 tablet (20 mg total) by mouth daily.    Dispense:  90 tablet    Refill:  2     Follow up: 3 months   Remember:   Should you have any questions or concerns please call the internal medicine clinic at [phone].    Kathleen Lime, M.D [facility]

## 2024-01-03 ENCOUNTER — Other Ambulatory Visit (HOSPITAL_COMMUNITY): Payer: Self-pay | Admitting: *Deleted

## 2024-01-03 ENCOUNTER — Telehealth: Payer: Self-pay | Admitting: Student

## 2024-01-03 DIAGNOSIS — Z1231 Encounter for screening mammogram for malignant neoplasm of breast: Secondary | ICD-10-CM

## 2024-01-03 DIAGNOSIS — I509 Heart failure, unspecified: Secondary | ICD-10-CM

## 2024-01-03 NOTE — Telephone Encounter (Signed)
 Patient is unable to schedule her yearly mammogram as a new Order is needed per [facility] and she is due 02/2024  Last Mammogram on file.  MM 3D SCREENING MAMMOGRAM BILATERAL BREAST (Accession [id number]) (Order [id number]) Imaging Date: 03/21/2023 Department: [facility]    Can a new order be placed and she will be called and scheduled.?

## 2024-01-07 ENCOUNTER — Telehealth (HOSPITAL_COMMUNITY): Payer: Self-pay | Admitting: *Deleted

## 2024-01-07 NOTE — Telephone Encounter (Signed)
 Reaching out to patient to offer assistance regarding upcoming cardiac imaging study; pt verbalizes understanding of appt date/time, parking situation and where to check in, pre-test NPO status and verified current allergies; name and call back number provided for further questions should they arise  Larey Brick RN Navigator Cardiac Imaging [facility] [phone] office [phone] cell  Patient aware to avoid caffeine 12 hours prior to her cardiac PET scan.

## 2024-01-08 ENCOUNTER — Ambulatory Visit (HOSPITAL_COMMUNITY)
Admission: RE | Admit: 2024-01-08 | Discharge: 2024-01-08 | Disposition: A | Discharge status: Home / Self Care | Payer: MEDICAID | Source: Ambulatory Visit | Attending: Internal Medicine | Admitting: Internal Medicine

## 2024-01-08 DIAGNOSIS — R002 Palpitations: Secondary | ICD-10-CM | POA: Diagnosis not present

## 2024-01-08 DIAGNOSIS — I340 Nonrheumatic mitral (valve) insufficiency: Secondary | ICD-10-CM | POA: Diagnosis not present

## 2024-01-08 DIAGNOSIS — R072 Precordial pain: Secondary | ICD-10-CM | POA: Diagnosis not present

## 2024-01-08 LAB — NM PET CT CARDIAC PERFUSION MULTI W/ABSOLUTE BLOODFLOW
LV dias vol: 109 mL (ref 46–106)
LV sys vol: 59 mL
MBFR: 2.69
Nuc Rest EF: 46 %
Nuc Stress EF: 63 %
Rest MBF: 0.85 ml/g/min
Rest Nuclear Isotope Dose: 16.7 mCi
ST Depression (mm): 0 mm
Stress MBF: 2.29 ml/g/min
Stress Nuclear Isotope Dose: 16.7 mCi

## 2024-01-08 MED ORDER — RUBIDIUM RB82 GENERATOR (RUBYFILL)
16.7400 | PACK | Freq: Once | INTRAVENOUS | Status: AC
  Administered 2024-01-08: 16.74 via INTRAVENOUS

## 2024-01-08 MED ORDER — REGADENOSON 0.4 MG/5ML IV SOLN
INTRAVENOUS | Status: AC
  Filled 2024-01-08: qty 5

## 2024-01-08 MED ORDER — REGADENOSON 0.4 MG/5ML IV SOLN
0.4000 mg | Freq: Once | INTRAVENOUS | Status: AC
  Administered 2024-01-08: 0.4 mg via INTRAVENOUS

## 2024-01-08 MED ORDER — RUBIDIUM RB82 GENERATOR (RUBYFILL)
16.7300 | PACK | Freq: Once | INTRAVENOUS | Status: AC
  Administered 2024-01-08: 16.73 via INTRAVENOUS

## 2024-01-08 NOTE — Progress Notes (Signed)
Tolerated Lexiscan well.

## 2024-01-08 NOTE — Progress Notes (Signed)
 Internal Medicine Clinic Attending  Case discussed with the resident at the time of the visit.  We reviewed the resident's history and exam and pertinent patient test results.  I agree with the assessment, diagnosis, and plan of care documented in the resident's note.

## 2024-01-13 NOTE — Progress Notes (Unsigned)
 Cardiology Office Note    Patient Name: [name] Date of Encounter: 01/14/2024  Primary Care Provider:  Carmina Miller, DO Primary Cardiologist:  Christell Constant, MD Primary Electrophysiologist: None   Past Medical History    Past Medical History:  Diagnosis Date   Alcoholism [facility])    ANEMIA, VITAMIN B12 DEFICIENCY 11/11/2007   Documented low 175 on 09/2007; received 6 yrs of IM therapy; switched to po 05/2013    Anxiety    Breast pain, left 06/02/2021   Cardiac arrest (HCC) 01/16/2013   witnessed, shockable rhythm, initally low EF, no CAD at cath. Drug screen negative.   Chest pain    a. 04/2009 Echo: EF 55-60%, Gr1 DD, Mild MR.   Depression    Duodenitis    secondary to H pylori(treatment completed 05/2006), +/- NSAIDS   Duodenitis    a. 05/2006 2/2 H pylori +/- NSAIDS   DVT (deep venous thrombosis) (HCC)    a. 04/2009 - treated with coumadin x 8 mos.   ETOH abuse    Heart murmur    History of cocaine abuse (HCC)    History of CVA (cerebrovascular accident)    Hypertension    Left arm pain 06/11/2017   PE 05/16/2009   CT angio positive for PE in 07/10, treated with coumadin for 8 months.    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 04/2009 - treated with coumadin x 8 mos.   Rectal bleeding 2007   internal hemmorhoids by colonoscopy in 04/2006, Bx neg for IBD   Rectal bleeding    a. int hemorrhoids by colonoscopy in 04/2006, Bx neg for IBD   Right foot pain 05/05/2020   Seizures (HCC)    Shortness of breath 05/28/2020   Stroke (HCC)    Tobacco abuse    Trochanteric bursitis of left hip 05/22/2016    History of Present Illness  [name] is a 63 y.o. 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  She underwent the [place] sun protocol at  that time.  She underwent LHC that showed no obstructive disease and TTE was completed showing EF of 25% with moderate MR with cardiac MRI completed showing EF of 51% with small area of inferior lateral delayed enhancement.  She was also found to be hypokalemic and had stopped drinking with possible arrest secondary to adrenergic surge from EtOH withdrawal.  She was discharged with a LifeVest and followed by the AHF clinic with titration of GDMT of and close follow-up with Dr. Shirlee Latch.  She had a repeat 2D echo completed 03/2013 that showed recovered EF of 55% with mild septal hypokinesis and normal RV size and systolic function.  She reported complete cessation of EtOH and was congratulated.  She was seen on 06/08/2014 with complaint of exertional chest pain.  She underwent a Myoview that was intermediate risk with EF of 47% and reversible anterior defect.  She unfortunately started drinking alcohol again at that time.  She underwent a repeat LHC that showed no angiographic evidence of CAD.  She was lost to follow-up until 10/15/2023 when she was seen for complaint of chest pain.  She described the pain as exertional shortness of breath.  She underwent updated echocardiogram as well as a PET stress test due to previous  left bundle branch block that showed EF of 55-60% with grade 1 DD and moderate dilated LA/mildly dilated RA with moderate MVR.  PET stress test results showed normal left ventricular perfusion with no evidence of ischemia or infarction.  She also wore an event monitor due to history of VF that showed predominantly sinus rhythm with no evidence of malignant arrhythmia.  [name] presents today for follow up.  She reports today a sharp pain located in the middle of her back that is different than her discomfort felt when followed up by Dr. Raynelle Jan in December.The pain is described as being deep within the chest, not radiating to the shoulder blades. The onset of the pain is not specified, but the  patient believes it may be related to a forthcoming mammogram. This pain is different from the exertional pain previously experienced by the patient, which led to a cardiac workup. Her cardiac workup, including an echocardiogram and stress test, returned normal results. However, the patient's left side of the heart is enlarged due to a leaking mitral valve. The patient also experiences shortness of breath, which may be related to her COPD, a condition likely exacerbated by her history of smoking. The patient reports smoking about five cigarettes a day, with a pack lasting about four days. Her other medications appear to be well-tolerated, and she has been prescribed Lasix as needed to manage fluid retention. The patient also reports some wheezing, particularly at night, and is due to see a pulmonologist for further management of her COPD. Patient denies chest pain, palpitations, dyspnea, PND, orthopnea, nausea, vomiting, dizziness, syncope, edema, weight gain, or early satiety.   Review of Systems  Please see the history of present illness.    All other systems reviewed and are otherwise negative except as noted above.  Physical Exam    Wt Readings from Last 3 Encounters:  01/14/24 141 lb (64 kg)  01/02/24 141 lb 8 oz (64.2 kg)  10/22/23 145 lb 3.2 oz (65.9 kg)   VS: Vitals:   01/14/24 0901  BP: 110/66  Pulse: 70  SpO2: 99%  ,Body mass index is 24.2 kg/m. GEN: Well nourished, well developed in no acute distress Neck: No JVD; No carotid bruits Pulmonary: Clear to auscultation without rales, wheezing or rhonchi  Cardiovascular: Normal rate. Regular rhythm. Normal S1. Normal S2.   Murmurs: There is no murmur.  ABDOMEN: Soft, non-tender, non-distended EXTREMITIES:  No edema; No deformity   EKG/LABS/ Recent Cardiac Studies   ECG personally reviewed by me today -sinus rhythm with left bundle branch block and no acute changes with rate of 65 bpm  Risk Assessment/Calculations:           Lab Results  Component Value Date   WBC 9.7 06/05/2023   HGB 11.9 06/05/2023   HCT 36.5 06/05/2023   MCV 87 06/05/2023   PLT 288 06/05/2023   Lab Results  Component Value Date   CREATININE 0.76 03/07/2023   BUN 11 03/07/2023   NA 136 03/07/2023   K 3.8 03/07/2023   CL 105 03/07/2023   CO2 22 03/07/2023   Lab Results  Component Value Date   CHOL 201 (H) 06/05/2023   HDL 53 06/05/2023   LDLCALC 88 06/05/2023   TRIG 371 (H) 06/05/2023   CHOLHDL 3.8 06/05/2023    Lab Results  Component Value Date   HGBA1C 6.0 (H) 06/05/2023   Assessment & Plan    1.HFimpEF: -Patient's most recent EF documented at 55-60% with moderate MVR and  dilated RV. -Patient is euvolemic on examination today's but does note increased shortness of breath with exertion. -She has a significant smoking history and was advised to follow-up with pulmonology. -We will add as needed Lasix 20 mg with instructions to take 20 mg  for weight gain of 2 pounds in 24 hours or 5 pounds in 1 week. -Continue current GDMT with carvedilol 6.25 mg twice daily -Low sodium diet, fluid restriction <2L, and daily weights encouraged. Educated to contact our office for weight gain of 2 lbs overnight or 5 lbs in one week.   2.  Nonischemic CM: -s/p VF arrest 2014 with improved EF and last 2D echo showing EF of 55 to 60% -Event monitor was completed showing predominantly sinus rhythm with no malignant arrhythmias noted.  3.  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 Normal heart function and perfusion. Emphasized hypertension control and sodium intake management. - Monitor hypertension. - Annual echocardiogram. - Prescribe Lasix for weight gain of 2 pounds in 24 hours or 5 pounds in a week.  4.  HTN: -Patient's blood pressure today was 110/66 -Continue carvedilol 6.25 mg twice daily -Continue low-sodium heart healthy diet.  5.  HLD: -Patient's last LDL cholesterol was 88 -Continue  Lipitor 40 mg daily  6.  History of polysubstance abuse: -Patient reports drinking socially 1 drink but denies any illicit drug use.  Disposition: Follow-up with Christell Constant, MD or APP in 6 months    Signed, Napoleon Form, Leodis Rains, NP 01/14/2024, 9:29 AM  [facility]

## 2024-01-14 ENCOUNTER — Encounter: Payer: Self-pay | Admitting: Nurse Practitioner

## 2024-01-14 ENCOUNTER — Ambulatory Visit: Payer: MEDICAID | Attending: Nurse Practitioner | Admitting: Nurse Practitioner

## 2024-01-14 VITALS — BP 110/66 | HR 70 | Ht 64.0 in | Wt 141.0 lb

## 2024-01-14 DIAGNOSIS — I5032 Chronic diastolic (congestive) heart failure: Secondary | ICD-10-CM | POA: Diagnosis not present

## 2024-01-14 DIAGNOSIS — F1910 Other psychoactive substance abuse, uncomplicated: Secondary | ICD-10-CM

## 2024-01-14 DIAGNOSIS — I10 Essential (primary) hypertension: Secondary | ICD-10-CM | POA: Diagnosis not present

## 2024-01-14 DIAGNOSIS — I340 Nonrheumatic mitral (valve) insufficiency: Secondary | ICD-10-CM | POA: Diagnosis not present

## 2024-01-14 DIAGNOSIS — I428 Other cardiomyopathies: Secondary | ICD-10-CM | POA: Diagnosis not present

## 2024-01-14 DIAGNOSIS — I5020 Unspecified systolic (congestive) heart failure: Secondary | ICD-10-CM

## 2024-01-14 DIAGNOSIS — E785 Hyperlipidemia, unspecified: Secondary | ICD-10-CM

## 2024-01-14 MED ORDER — FUROSEMIDE 20 MG PO TABS: 20.0000 mg | ORAL_TABLET | Freq: Every day | ORAL | 1 refills | Status: DC | PRN

## 2024-01-14 NOTE — Patient Instructions (Addendum)
 Medication Instructions:  START Take lasix 20mg  as needed if you gain more than 2lbs in a day or 5lbs in a week.  Limit your salt intake to 1500-2000mg  per day or 500mg  of Sodium per meal. *If you need a refill on your cardiac medications before your next appointment, please call your pharmacy*   Lab Work: None Ordered If you have labs (blood work) drawn today and your tests are completely normal, you will receive your results only by: MyChart Message (if you have MyChart) OR A paper copy in the mail If you have any lab test that is abnormal or we need to change your treatment, we will call you to review the results.   Testing/Procedures: Your physician has requested that you have a carotid duplex. This test is an ultrasound of the carotid arteries in your neck. It looks at blood flow through these arteries that supply the brain with blood. Allow one hour for this exam. There are no restrictions or special instructions.   Follow-Up: At [facility], you and your health needs are our priority.  As part of our continuing mission to provide you with exceptional heart care, we have created designated Provider Care Teams.  These Care Teams include your primary Cardiologist (physician) and Advanced Practice Providers (APPs -  Physician Assistants and Nurse Practitioners) who all work together to provide you with the care you need, when you need it.  We recommend signing up for the patient portal called "MyChart".  Sign up information is provided on this After Visit Summary.  MyChart is used to connect with patients for Virtual Visits (Telemedicine).  Patients are able to view lab/test results, encounter notes, upcoming appointments, etc.  Non-urgent messages can be sent to your provider as well.   To learn more about what you can do with MyChart, go to [email or web address].    Your next appointment:   6 month(s)  Provider:   Christell Constant, MD     Other  Instructions: Heart-Healthy Eating Plan Eating a healthy diet is important for the health of your heart. A heart-healthy eating plan includes: Eating less unhealthy fats. Eating more healthy fats. Eating less salt in your food. Salt is also called sodium. Making other changes in your diet. Talk with your doctor or a diet specialist (dietitian) to create an eating plan that is right for you. What is my plan? Your doctor may recommend an eating plan that includes: Total fat: ______% or less of total calories a day. Saturated fat: ______% or less of total calories a day. Cholesterol: less than _________mg a day. Sodium: less than _________mg a day. What are tips for following this plan? Cooking Avoid frying your food. Try to bake, boil, grill, or broil it instead. You can also reduce fat by: Removing the skin from poultry. Removing all visible fats from meats. Steaming vegetables in water or broth. Meal planning  At meals, divide your plate into four equal parts: Fill one-half of your plate with vegetables and green salads. Fill one-fourth of your plate with whole grains. Fill one-fourth of your plate with lean protein foods. Eat 2-4 cups of vegetables per day. One cup of vegetables is: 1 cup (91 g) broccoli or cauliflower florets. 2 medium carrots. 1 large bell pepper. 1 large sweet potato. 1 large tomato. 1 medium white potato. 2 cups (150 g) raw leafy greens. Eat 1-2 cups of fruit per day. One cup of fruit is: 1 small apple 1 large banana 1 cup (237  g) mixed fruit, 1 large orange,  cup (82 g) dried fruit, 1 cup (240 mL) 100% fruit juice. Eat more foods that have soluble fiber. These are apples, broccoli, carrots, beans, peas, and barley. Try to get 20-30 g of fiber per day. Eat 4-5 servings of nuts, legumes, and seeds per week: 1 serving of dried beans or legumes equals  cup (90 g) cooked. 1 serving of nuts is  oz (12 almonds, 24 pistachios, or 7 walnut halves). 1  serving of seeds equals  oz (8 g). General information Eat more home-cooked food. Eat less restaurant, buffet, and fast food. Limit or avoid alcohol. Limit foods that are high in starch and sugar. Avoid fried foods. Lose weight if you are overweight. Keep track of how much salt (sodium) you eat. This is important if you have high blood pressure. Ask your doctor to tell you more about this. Try to add vegetarian meals each week. Fats Choose healthy fats. These include olive oil and canola oil, flaxseeds, walnuts, almonds, and seeds. Eat more omega-3 fats. These include salmon, mackerel, sardines, tuna, flaxseed oil, and ground flaxseeds. Try to eat fish at least 2 times each week. Check food labels. Avoid foods with trans fats or high amounts of saturated fat. Limit saturated fats. These are often found in animal products, such as meats, butter, and cream. These are also found in plant foods, such as palm oil, palm kernel oil, and coconut oil. Avoid foods with partially hydrogenated oils in them. These have trans fats. Examples are stick margarine, some tub margarines, cookies, crackers, and other baked goods. What foods should I eat? Fruits All fresh, canned (in natural juice), or frozen fruits. Vegetables Fresh or frozen vegetables (raw, steamed, roasted, or grilled). Green salads. Grains Most grains. Choose whole wheat and whole grains most of the time. Rice and pasta, including brown rice and pastas made with whole wheat. Meats and other proteins Lean, well-trimmed beef, veal, pork, and lamb. Chicken and [place] without skin. All fish and shellfish. Wild duck, rabbit, pheasant, and venison. Egg whites or low-cholesterol egg substitutes. Dried beans, peas, lentils, and tofu. Seeds and most nuts. Dairy Low-fat or nonfat cheeses, including ricotta and mozzarella. Skim or 1% milk that is liquid, powdered, or evaporated. Buttermilk that is made with low-fat milk. Nonfat or low-fat  yogurt. Fats and oils Non-hydrogenated (trans-free) margarines. Vegetable oils, including soybean, sesame, sunflower, olive, peanut, safflower, corn, canola, and cottonseed. Salad dressings or mayonnaise made with a vegetable oil. Beverages Mineral water. Coffee and tea. Diet carbonated beverages. Sweets and desserts Sherbet, gelatin, and fruit ice. Small amounts of dark chocolate. Limit all sweets and desserts. Seasonings and condiments All seasonings and condiments. The items listed above may not be a complete list of foods and drinks you can eat. Contact a dietitian for more options. What foods should I avoid? Fruits Canned fruit in heavy syrup. Fruit in cream or butter sauce. Fried fruit. Limit coconut. Vegetables Vegetables cooked in cheese, cream, or butter sauce. Fried vegetables. Grains Breads that are made with saturated or trans fats, oils, or whole milk. Croissants. Sweet rolls. Donuts. High-fat crackers, such as cheese crackers. Meats and other proteins Fatty meats, such as hot dogs, ribs, sausage, bacon, rib-eye roast or steak. High-fat deli meats, such as salami and bologna. Caviar. Domestic duck and goose. Organ meats, such as liver. Dairy Cream, sour cream, cream cheese, and creamed cottage cheese. Whole-milk cheeses. Whole or 2% milk that is liquid, evaporated, or condensed. Whole buttermilk. Cream  sauce or high-fat cheese sauce. Yogurt that is made from whole milk. Fats and oils Meat fat, or shortening. Cocoa butter, hydrogenated oils, palm oil, coconut oil, palm kernel oil. Solid fats and shortenings, including bacon fat, salt pork, lard, and butter. Nondairy cream substitutes. Salad dressings with cheese or sour cream. Beverages Regular sodas and juice drinks with added sugar. Sweets and desserts Frosting. Pudding. Cookies. Cakes. Pies. Milk chocolate or white chocolate. Buttered syrups. Full-fat ice cream or ice cream drinks. The items listed above may not be a  complete list of foods and drinks to avoid. Contact a dietitian for more information. Summary Heart-healthy meal planning includes eating less unhealthy fats, eating more healthy fats, and making other changes in your diet. Eat a balanced diet. This includes fruits and vegetables, low-fat or nonfat dairy, lean protein, nuts and legumes, whole grains, and heart-healthy oils and fats. This information is not intended to replace advice given to you by your health care provider. Make sure you discuss any questions you have with your health care provider. Document Revised: 11/21/2021 Document Reviewed: 11/21/2021 Elsevier Patient Education  2024 Elsevier Inc.   Low-Sodium Eating Plan Salt (sodium) helps you keep a healthy balance of fluids in your body. Too much sodium can raise your blood pressure. It can also cause fluid and waste to be held in your body. Your health care provider or dietitian may recommend a low-sodium eating plan if you have high blood pressure (hypertension), kidney disease, liver disease, or heart failure. Eating less sodium can help lower your blood pressure and reduce swelling. It can also protect your heart, liver, and kidneys. What are tips for following this plan? Reading food labels  Check food labels for the amount of sodium per serving. If you eat more than one serving, you must multiply the listed amount by the number of servings. Choose foods with less than 140 milligrams (mg) of sodium per serving. Avoid foods with 300 mg of sodium or more per serving. Always check how much sodium is in a product, even if the label says "unsalted" or "no salt added." Shopping  Buy products labeled as "low-sodium" or "no salt added." Buy fresh foods. Avoid canned foods and pre-made or frozen meals. Avoid canned, cured, or processed meats. Buy breads that have less than 80 mg of sodium per slice. Cooking  Eat more home-cooked food. Try to eat less restaurant, buffet, and fast  food. Try not to add salt when you cook. Use salt-free seasonings or herbs instead of table salt or sea salt. Check with your provider or pharmacist before using salt substitutes. Cook with plant-based oils, such as canola, sunflower, or olive oil. Meal planning When eating at a restaurant, ask if your food can be made with less salt or no salt. Avoid dishes labeled as brined, pickled, cured, or smoked. Avoid dishes made with soy sauce, miso, or teriyaki sauce. Avoid foods that have monosodium glutamate (MSG) in them. MSG may be added to some restaurant food, sauces, soups, bouillon, and canned foods. Make meals that can be grilled, baked, poached, roasted, or steamed. These are often made with less sodium. General information Try to limit your sodium intake to 1,500-2,300 mg each day, or the amount told by your provider. What foods should I eat? Fruits Fresh, frozen, or canned fruit. Fruit juice. Vegetables Fresh or frozen vegetables. "No salt added" canned vegetables. "No salt added" tomato sauce and paste. Low-sodium or reduced-sodium tomato and vegetable juice. Grains Low-sodium cereals, such as oats,  puffed wheat and rice, and shredded wheat. Low-sodium crackers. Unsalted rice. Unsalted pasta. Low-sodium bread. Whole grain breads and whole grain pasta. Meats and other proteins Fresh or frozen meat, poultry, seafood, and fish. These should have no added salt. Low-sodium canned tuna and salmon. Unsalted nuts. Dried peas, beans, and lentils without added salt. Unsalted canned beans. Eggs. Unsalted nut butters. Dairy Milk. Soy milk. Cheese that is naturally low in sodium, such as ricotta cheese, fresh mozzarella, or Swiss cheese. Low-sodium or reduced-sodium cheese. Cream cheese. Yogurt. Seasonings and condiments Fresh and dried herbs and spices. Salt-free seasonings. Low-sodium mustard and ketchup. Sodium-free salad dressing. Sodium-free light mayonnaise. Fresh or refrigerated horseradish.  Lemon juice. Vinegar. Other foods Homemade, reduced-sodium, or low-sodium soups. Unsalted popcorn and pretzels. Low-salt or salt-free chips. The items listed above may not be all the foods and drinks you can have. Talk to a dietitian to learn more. What foods should I avoid? Vegetables Sauerkraut, pickled vegetables, and relishes. Olives. [place] fries. Onion rings. Regular canned vegetables, except low-sodium or reduced-sodium items. Regular canned tomato sauce and paste. Regular tomato and vegetable juice. Frozen vegetables in sauces. Grains Instant hot cereals. Bread stuffing, pancake, and biscuit mixes. Croutons. Seasoned rice or pasta mixes. Noodle soup cups. Boxed or frozen macaroni and cheese. Regular salted crackers. Self-rising flour. Meats and other proteins Meat or fish that is salted, canned, smoked, spiced, or pickled. Precooked or cured meat, such as sausages or meat loaves. Tomasa Blase. Ham. Pepperoni. Hot dogs. Corned beef. Chipped beef. Salt pork. Jerky. Pickled herring, anchovies, and sardines. Regular canned tuna. Salted nuts. Dairy Processed cheese and cheese spreads. Hard cheeses. Cheese curds. Blue cheese. Feta cheese. String cheese. Regular cottage cheese. Buttermilk. Canned milk. Fats and oils Salted butter. Regular margarine. Ghee. Bacon fat. Seasonings and condiments Onion salt, garlic salt, seasoned salt, table salt, and sea salt. Canned and packaged gravies. Worcestershire sauce. Tartar sauce. Barbecue sauce. Teriyaki sauce. Soy sauce, including reduced-sodium soy sauce. Steak sauce. Fish sauce. Oyster sauce. Cocktail sauce. Horseradish that you find on the shelf. Regular ketchup and mustard. Meat flavorings and tenderizers. Bouillon cubes. Hot sauce. Pre-made or packaged marinades. Pre-made or packaged taco seasonings. Relishes. Regular salad dressings. Salsa. Other foods Salted popcorn and pretzels. Corn chips and puffs. Potato and tortilla chips. Canned or dried soups.  Pizza. Frozen entrees and pot pies. The items listed above may not be all the foods and drinks you should avoid. Talk to a dietitian to learn more. This information is not intended to replace advice given to you by your health care provider. Make sure you discuss any questions you have with your health care provider. Document Revised: 11/02/2022 Document Reviewed: 11/02/2022 Elsevier Patient Education  2024 [employer].

## 2024-01-23 ENCOUNTER — Telehealth: Payer: Self-pay | Admitting: *Deleted

## 2024-01-23 NOTE — Telephone Encounter (Signed)
 Mammogram appointment May 23.2025 @ 11:00 am / patient scheduled appointment / breast center,

## 2024-01-24 ENCOUNTER — Other Ambulatory Visit: Payer: Self-pay | Admitting: Nurse Practitioner

## 2024-01-24 DIAGNOSIS — I10 Essential (primary) hypertension: Secondary | ICD-10-CM

## 2024-01-24 DIAGNOSIS — I340 Nonrheumatic mitral (valve) insufficiency: Secondary | ICD-10-CM

## 2024-01-24 DIAGNOSIS — E785 Hyperlipidemia, unspecified: Secondary | ICD-10-CM

## 2024-01-24 DIAGNOSIS — I5032 Chronic diastolic (congestive) heart failure: Secondary | ICD-10-CM

## 2024-01-24 DIAGNOSIS — I428 Other cardiomyopathies: Secondary | ICD-10-CM

## 2024-01-24 DIAGNOSIS — F1910 Other psychoactive substance abuse, uncomplicated: Secondary | ICD-10-CM

## 2024-01-28 ENCOUNTER — Ambulatory Visit: Payer: Self-pay | Admitting: Student

## 2024-01-28 NOTE — Telephone Encounter (Signed)
 Difficulty breathing x2 months  Symptoms: Dry cough, wheezing at night (uses albuterol), SOB Pertinent Negatives: Patient denies chest pain, fever  Disposition:  [x] Appointment(In office)  Additional Notes: Pt states she has allergies to the pollen. Pt scheduled for first available appt tmrw. This RN educated pt on home care, new-worsening symptoms, when to call back/seek emergent care. Pt verbalized understanding and agrees to plan.   Copied from CRM [id number]. Topic: Clinical - Red Word Triage >> Jan 28, 2024  3:11 PM Alvino Blood C wrote: Red Word that prompted transfer to Nurse Triage: Patient states she is having issues with breathing due to her allergies, Reason for Disposition  [1] MODERATE longstanding difficulty breathing (e.g., speaks in phrases, SOB even at rest, pulse 100-120) AND [2] SAME as normal  Answer Assessment - Initial Assessment Questions Difficulty breathing x2 months  Symptoms: Dry cough, wheezing at night (uses albuterol), SOB Pertinent Negatives: Patient denies chest pain, fever  Protocols used: Breathing Difficulty-A-AH

## 2024-01-29 ENCOUNTER — Ambulatory Visit: Payer: Self-pay | Admitting: Internal Medicine

## 2024-01-29 ENCOUNTER — Ambulatory Visit: Payer: MEDICAID | Admitting: Internal Medicine

## 2024-01-29 ENCOUNTER — Other Ambulatory Visit: Payer: Self-pay | Admitting: Nurse Practitioner

## 2024-01-29 DIAGNOSIS — I5032 Chronic diastolic (congestive) heart failure: Secondary | ICD-10-CM

## 2024-01-29 DIAGNOSIS — I340 Nonrheumatic mitral (valve) insufficiency: Secondary | ICD-10-CM

## 2024-01-29 DIAGNOSIS — I639 Cerebral infarction, unspecified: Secondary | ICD-10-CM

## 2024-01-29 DIAGNOSIS — I428 Other cardiomyopathies: Secondary | ICD-10-CM

## 2024-01-29 DIAGNOSIS — I10 Essential (primary) hypertension: Secondary | ICD-10-CM

## 2024-01-29 DIAGNOSIS — F1910 Other psychoactive substance abuse, uncomplicated: Secondary | ICD-10-CM

## 2024-01-29 DIAGNOSIS — E785 Hyperlipidemia, unspecified: Secondary | ICD-10-CM

## 2024-01-31 ENCOUNTER — Telehealth: Payer: Self-pay | Admitting: Student

## 2024-01-31 NOTE — Telephone Encounter (Signed)
 Attempted to contact patient this afternoon, but no answer.  Patient was called to inform her that the appointment on Monday 02/04/24 has been cancelled due to being scheduled with the diabetes educator and not a physician.  New appointment is Friday 02/08/2024 at 10:15 am with Dr. Colbert Coyer.

## 2024-02-01 ENCOUNTER — Ambulatory Visit (HOSPITAL_COMMUNITY): Payer: MEDICAID

## 2024-02-04 ENCOUNTER — Encounter: Payer: Self-pay | Admitting: Dietician

## 2024-02-04 ENCOUNTER — Ambulatory Visit (HOSPITAL_COMMUNITY)
Admission: RE | Admit: 2024-02-04 | Discharge: 2024-02-04 | Disposition: A | Discharge status: Home / Self Care | Payer: MEDICAID | Source: Ambulatory Visit | Attending: Nurse Practitioner | Admitting: Nurse Practitioner

## 2024-02-04 DIAGNOSIS — I6389 Other cerebral infarction: Secondary | ICD-10-CM | POA: Diagnosis not present

## 2024-02-04 DIAGNOSIS — I639 Cerebral infarction, unspecified: Secondary | ICD-10-CM | POA: Insufficient documentation

## 2024-02-08 ENCOUNTER — Ambulatory Visit: Payer: MEDICAID | Admitting: Student

## 2024-02-08 VITALS — BP 136/68 | HR 77 | Temp 97.9°F | Ht 64.0 in | Wt 139.1 lb

## 2024-02-08 DIAGNOSIS — Z0000 Encounter for general adult medical examination without abnormal findings: Secondary | ICD-10-CM

## 2024-02-08 DIAGNOSIS — F32A Depression, unspecified: Secondary | ICD-10-CM | POA: Diagnosis not present

## 2024-02-08 DIAGNOSIS — G479 Sleep disorder, unspecified: Secondary | ICD-10-CM | POA: Diagnosis not present

## 2024-02-08 DIAGNOSIS — F1910 Other psychoactive substance abuse, uncomplicated: Secondary | ICD-10-CM

## 2024-02-08 DIAGNOSIS — Z1211 Encounter for screening for malignant neoplasm of colon: Secondary | ICD-10-CM

## 2024-02-08 MED ORDER — BUPROPION HCL ER (XL) 300 MG PO TB24: 300.0000 mg | ORAL_TABLET | ORAL | 2 refills | Status: DC

## 2024-02-08 MED ORDER — OMEPRAZOLE MAGNESIUM 20 MG PO TBEC: 20.0000 mg | DELAYED_RELEASE_TABLET | Freq: Every day | ORAL | 2 refills | Status: DC

## 2024-02-08 NOTE — Progress Notes (Deleted)
 Complete physical exam  Patient: [name]   DOB: May 15, 1961   63 y.o. Female  MRN: [id number]  Subjective:    Chief Complaint  Patient presents with   Follow-up    Medication refill / requesting something for mood swing -requesting referral to counseling(phq-gad done)     [name] is a 63 y.o. female who presents today for a complete physical exam. She reports consuming a {diet types:17450} diet. {types:19826} She generally feels {DESC; WELL/FAIRLY WELL/POORLY:18703}. She reports sleeping {DESC; WELL/FAIRLY WELL/POORLY:18703}. She {does/does not:200015} have additional problems to discuss today.    Most recent fall risk assessment:    02/08/2024    9:54 AM  Fall Risk   Falls in the past year? 0  Number falls in past yr: 0  Injury with Fall? 0  Risk for fall due to : No Fall Risks  Follow up Falls evaluation completed     Most recent depression screenings:    02/08/2024   10:03 AM 01/02/2024    9:10 AM  PHQ 2/9 Scores  PHQ - 2 Score 4 0  PHQ- 9 Score 24 0    {VISON DENTAL STD PSA (Optional):27386}  {History (Optional):23778}  Patient Care Team: Carmina Miller, DO as PCP - General Christell Constant, MD as PCP - Cardiology (Cardiology)   Outpatient Medications Prior to Visit  Medication Sig   Acetaminophen Extra Strength 500 MG CAPS Take 1 capsule by mouth 3 (three) times daily.   albuterol (VENTOLIN HFA) 108 (90 Base) MCG/ACT inhaler Inhale 2 puffs into the lungs every 4 (four) hours as needed for wheezing or shortness of breath.   aspirin EC 81 MG tablet Take 1 tablet (81 mg total) by mouth daily. Swallow whole. (Patient not taking: Reported on 01/14/2024)   atorvastatin (LIPITOR) 40 MG tablet Take 1 tablet (40 mg total) by mouth daily.   baclofen (LIORESAL) 10 MG tablet Take 1 tablet (10 mg total) by mouth 3 (three) times daily.   carvedilol (COREG) 6.25 MG tablet Take 1 tablet (6.25 mg total) by mouth 2 (two) times daily.   Cholecalciferol 25  MCG (1000 UT) CHEW Chew 1 tablet (1,000 Units total) by mouth daily.   diclofenac Sodium (VOLTAREN) 1 % GEL Apply 2 g topically 4 (four) times daily.   Fluticasone-Umeclidin-Vilant (TRELEGY ELLIPTA) 100-62.5-25 MCG/ACT AEPB Inhale 1 puff into the lungs daily. (Patient not taking: Reported on 01/14/2024)   furosemide (LASIX) 20 MG tablet Take 1 tablet (20 mg total) by mouth daily as needed.   gabapentin (NEURONTIN) 300 MG capsule Take 1 capsule (300 mg total) by mouth 3 (three) times daily.   nitroGLYCERIN (NITROSTAT) 0.4 MG SL tablet Place 1 tablet (0.4 mg total) under the tongue every 5 (five) minutes as needed for chest pain.   omeprazole (PRILOSEC OTC) 20 MG tablet Take 1 tablet (20 mg total) by mouth daily.   traZODone (DESYREL) 50 MG tablet Take 1 tablet (50 mg total) by mouth at bedtime as needed for sleep.   No facility-administered medications prior to visit.    ROS        Objective:     BP 136/68 (BP Location: Right Arm, Patient Position: Sitting, Cuff Size: Small)   Pulse 77   Temp 97.9 F (36.6 C) (Oral)   Ht 5\' 4"  (1.626 m)   Wt 139 lb 1.6 oz (63.1 kg)   LMP 11/02/2010   SpO2 97%   BMI 23.88 kg/m  {Vitals History (Optional):23777}  Physical Exam  No results found for any visits on 02/08/24. {Show previous labs (optional):23779}    Assessment & Plan:    Routine Health Maintenance and Physical Exam  Immunization History  Administered Date(s) Administered   Influenza Inj Mdck Quad Pf 10/13/2022   Influenza Split 09/12/2011, 09/04/2012   Influenza Whole 08/12/2008, 08/10/2009   Influenza, Seasonal, Injecte, Preservative Fre 08/08/2023   Influenza,inj,Quad PF,6+ Mos 09/22/2013, 08/24/2014, 07/26/2016, 07/28/2018, 09/29/2019   Moderna Sars-Covid-2 Vaccination 03/22/2020, 04/19/2020   Td 06/01/2009   Tdap 11/15/2022    Health Maintenance  Topic Date Due   Pneumococcal Vaccine 37-74 Years old (1 of 2 - PCV) Never done   Zoster Vaccines- Shingrix (1 of 2)  Never done   Colonoscopy  04/25/2016   COVID-19 Vaccine (3 - 2024-25 season) 07/01/2023   Cervical Cancer Screening (HPV/Pap Cotest)  02/13/2024   INFLUENZA VACCINE  05/30/2024   MAMMOGRAM  03/20/2025   DTaP/Tdap/Td (3 - Td or Tdap) 11/15/2032   Hepatitis C Screening  Completed   HIV Screening  Completed   HPV VACCINES  Aged Out   Meningococcal B Vaccine  Aged Out    Discussed health benefits of physical activity, and encouraged her to engage in regular exercise appropriate for her age and condition.  Problem List Items Addressed This Visit   None  No follow-ups on file.     Gust Rung, DO

## 2024-02-08 NOTE — Patient Instructions (Addendum)
 Thank you, [name] for allowing [place] to provide your care today. Your primary visit was for a Wellness visit. Today we also discussed your concerns for mood and sleep.   I have ordered the following labs for you:   Lab Orders         Fecal occult blood, imunochemical      Tests ordered today:  None  Referrals ordered today:    Referral Orders         Ambulatory referral to Psychiatry      I have ordered the following medication/changed the following medications:   Stop the following medications: Medications Discontinued During This Encounter  Medication Reason   omeprazole (PRILOSEC OTC) 20 MG tablet Reorder     Start the following medications: Meds ordered this encounter  Medications   omeprazole (PRILOSEC OTC) 20 MG tablet    Sig: Take 1 tablet (20 mg total) by mouth daily.    Dispense:  90 tablet    Refill:  2   buPROPion (WELLBUTRIN XL) 300 MG 24 hr tablet    Sig: Take 1 tablet (300 mg total) by mouth every morning. Take 150 mg by mouth (1/2 a tablet) every morning for one week, then if tolerated take a whole tablet starting the following week.    Dispense:  30 tablet    Refill:  2     Follow up: 1 month for depression, specialist referrals (GI for dysphagia, Psychiatry for mood)   Remember:   For sleep:  - If you are no longer taking Trazodone at home, please bring in the remaining medication so we can get rid of it safely at your next visit  - You can try taking Melatonin 4 mg - 6 mg 30 minutes before bedtime to help with sleep   For depression/mood: - Please start taking Wellbutrin/Bupropion 150 mg (half a tablet) for one week. We discussed possible side effects including dry mouth, constipation, dizziness, insomnia, and blurry vision among others. We also discussed the importance of taking it regularly and NOT stopping this medication suddenly, as it can lead to withdrawals. If well tolerated you can take a whole tablet daily the following week.  Please call our office if you feel like your depression/mood worsens on this medication. Please call [phone] if are having a mental health crisis.  - Please stop at the front desk to reschedule a session with [name] so you can continue to receive counseling on your substance use and mood - A referral has been placed for Psychiatry for additional evaluation into your concerns regarding Bipolar disorder, you will receive a call to follow up on the referral   Substance use:  We are glad you are cutting back on your tobacco and alcohol use. Please consider abstaining from cocaine use as it can lead to many health problems, including stroke and heart attack.    For healthcare maintenance:  - Please be sure to return your FIT test so we can help screen for colon cancer given your family history   Refills were sent to your preferred pharmacy. Please return in 1 month (or sooner if needed) so we can continue to help with your sleep, mood, and other chronic health problems.   Should you have any questions or concerns please call the internal medicine clinic at [phone].     Michelena Culmer Colbert Coyer, MD PGY-1 Internal Medicine Teaching Progam [facility]

## 2024-02-10 NOTE — Assessment & Plan Note (Signed)
 Patient overdue for colonoscopy. Willing to get FIT testing, will pick up kit today.  Plan - Follow up on FIT testing as needed

## 2024-02-10 NOTE — Assessment & Plan Note (Signed)
 Patient used to take Trazodone to help with sleep (has difficulty getting to sleep), has not taken in some time. Discussed importance of getting rid of any leftover medication safely. Patient agreeable to trying Melatonin.  Plan - Patient to try taking Melatonin 4 mg - 6 mg 30 minutes before bedtime to help with sleep - Follow up in 1 month

## 2024-02-10 NOTE — Assessment & Plan Note (Addendum)
 Patient with significant history of tobacco use, down to a few cigarettes a day. Has been struggling with mood and depression. Mostly distraught that boyfriend has threatened to leave her if she does not regular her mood better. States he has angry outbursts, difficult to control emotions. Per patient, she was diagnosed with Bipolar disorder manic depressive type while getting worked up for social disability last year. Does not currently go to a psychiatrist or take any antipsychotics. Patient has found integrated behavioral health sessions with Amie Bald helpful, has not had a recent session. Discussed options, patient willing to reschedule sessions with [facility], start antidepressant therapy with Wellbutrin which can help with smoking cessation as well, and a psychiatry referral for further evaluation of possible concomitant mood disorder.  Plan - START Wellbutrin/Bupropion 150 mg (half a tablet) for one week then go up to 300 mg daily. Discussed common side effects and mental crisis resources.  - Follow up with [facility] for additional behavioral health sessions - Follow up on psychiatry referral

## 2024-02-10 NOTE — Progress Notes (Signed)
 Complete physical exam  Patient: [name]   DOB: [birth year]   63 y.o. Female  MRN: [id number]  Subjective:    Chief Complaint  Patient presents with   Follow-up    Medication refill / requesting something for mood swing -requesting referral to counseling(phq-gad done)     CAMARIE Knapp is a 63 y.o. female who presents today for a complete physical exam. She reports consuming a general diet. The patient does not participate in regular exercise at present. She generally feels fairly well. She reports sleeping poorly. She does have additional problems to discuss today.    Most recent fall risk assessment:    02/08/2024    9:54 AM  Fall Risk   Falls in the past year? 0  Number falls in past yr: 0  Injury with Fall? 0  Risk for fall due to : No Fall Risks  Follow up Falls evaluation completed     Most recent depression screenings:    02/08/2024   10:03 AM 01/02/2024    9:10 AM  PHQ 2/9 Scores  PHQ - 2 Score 4 0  PHQ- 9 Score 24 0   Past Medical History:  Diagnosis Date   Alcoholism (HCC)    ANEMIA, VITAMIN B12 DEFICIENCY 11/11/2007   Documented low 175 on 09/2007; received 6 yrs of IM therapy; switched to po 05/2013    Anxiety    Breast pain, left 06/02/2021   Cardiac arrest (HCC) 01/16/2013   witnessed, shockable rhythm, initally low EF, no CAD at cath. Drug screen negative.   Chest pain    a. 04/2009 Echo: EF 55-60%, Gr1 DD, Mild MR.   Depression    Duodenitis    secondary to H pylori(treatment completed 05/2006), +/- NSAIDS   Duodenitis    a. 05/2006 2/2 H pylori +/- NSAIDS   DVT (deep venous thrombosis) (HCC)    a. 04/2009 - treated with coumadin x 8 mos.   ETOH abuse    Heart murmur    History of cocaine abuse (HCC)    History of CVA (cerebrovascular accident)    Hypertension    Left arm pain 06/11/2017   PE 05/16/2009   CT angio positive for PE in 07/10, treated with coumadin for 8 months.    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 04/2009 -  treated with coumadin x 8 mos.   Rectal bleeding 2007   internal hemmorhoids by colonoscopy in 04/2006, Bx neg for IBD   Rectal bleeding    a. int hemorrhoids by colonoscopy in 04/2006, Bx neg for IBD   Right foot pain 05/05/2020   Seizures (HCC)    Shortness of breath 05/28/2020   Stroke [facility])    Tobacco abuse    Trochanteric bursitis of left hip 05/22/2016      Patient Care Team: Ronni Colace, DO as PCP - General Jann Melody, MD as PCP - Cardiology (Cardiology)   Outpatient Medications Prior to Visit  Medication Sig   Acetaminophen Extra Strength 500 MG CAPS Take 1 capsule by mouth 3 (three) times daily.   albuterol (VENTOLIN HFA) 108 (90 Base) MCG/ACT inhaler Inhale 2 puffs into the lungs every 4 (four) hours as needed for wheezing or shortness of breath.   aspirin EC 81 MG tablet Take 1 tablet (81 mg total) by mouth daily. Swallow whole. (Patient not taking: Reported on 01/14/2024)   atorvastatin (LIPITOR) 40 MG tablet Take 1 tablet (40 mg total) by mouth  daily.   baclofen (LIORESAL) 10 MG tablet Take 1 tablet (10 mg total) by mouth 3 (three) times daily.   carvedilol (COREG) 6.25 MG tablet Take 1 tablet (6.25 mg total) by mouth 2 (two) times daily.   Cholecalciferol 25 MCG (1000 UT) CHEW Chew 1 tablet (1,000 Units total) by mouth daily.   diclofenac Sodium (VOLTAREN) 1 % GEL Apply 2 g topically 4 (four) times daily.   Fluticasone-Umeclidin-Vilant (TRELEGY ELLIPTA) 100-62.5-25 MCG/ACT AEPB Inhale 1 puff into the lungs daily. (Patient not taking: Reported on 01/14/2024)   furosemide (LASIX) 20 MG tablet Take 1 tablet (20 mg total) by mouth daily as needed.   gabapentin (NEURONTIN) 300 MG capsule Take 1 capsule (300 mg total) by mouth 3 (three) times daily.   nitroGLYCERIN (NITROSTAT) 0.4 MG SL tablet Place 1 tablet (0.4 mg total) under the tongue every 5 (five) minutes as needed for chest pain.   traZODone (DESYREL) 50 MG tablet Take 1 tablet (50 mg total) by mouth at bedtime  as needed for sleep.   [DISCONTINUED] omeprazole (PRILOSEC OTC) 20 MG tablet Take 1 tablet (20 mg total) by mouth daily.   No facility-administered medications prior to visit.    ROS        Objective:     BP 136/68 (BP Location: Right Arm, Patient Position: Sitting, Cuff Size: Small)   Pulse 77   Temp 97.9 F (36.6 C) (Oral)   Ht 5\' 4"  (1.626 m)   Wt 139 lb 1.6 oz (63.1 kg)   LMP 11/02/2010   SpO2 97%   BMI 23.88 kg/m  BP Readings from Last 3 Encounters:  02/08/24 136/68  01/14/24 110/66  01/08/24 118/64   Wt Readings from Last 3 Encounters:  02/08/24 139 lb 1.6 oz (63.1 kg)  01/14/24 141 lb (64 kg)  01/02/24 141 lb 8 oz (64.2 kg)      Physical Exam HENT:     Head: Normocephalic and atraumatic.     Nose: Nose normal.     Comments: No overt bleeding observed    Mouth/Throat:     Mouth: Mucous membranes are moist.  Eyes:     Pupils: Pupils are equal, round, and reactive to light.  Cardiovascular:     Rate and Rhythm: Normal rate and regular rhythm.     Heart sounds: Normal heart sounds.  Pulmonary:     Effort: Pulmonary effort is normal.     Breath sounds: Normal breath sounds.  Abdominal:     General: Abdomen is flat. Bowel sounds are normal.     Palpations: Abdomen is soft.  Musculoskeletal:        General: Normal range of motion.  Skin:    General: Skin is warm.  Neurological:     Mental Status: She is alert. Mental status is at baseline.  Psychiatric:     Comments: Depressed mood      No results found for any visits on 02/08/24.    Assessment & Plan:    Routine Health Maintenance and Physical Exam  Immunization History  Administered Date(s) Administered   Influenza Inj Mdck Quad Pf 10/13/2022   Influenza Split 09/12/2011, 09/04/2012   Influenza Whole 08/12/2008, 08/10/2009   Influenza, Seasonal, Injecte, Preservative Fre 08/08/2023   Influenza,inj,Quad PF,6+ Mos 09/22/2013, 08/24/2014, 07/26/2016, 07/28/2018, 09/29/2019   Moderna  Sars-Covid-2 Vaccination 03/22/2020, 04/19/2020   Td 06/01/2009   Tdap 11/15/2022    Health Maintenance  Topic Date Due   Pneumococcal Vaccine 64-42 Years old (1 of 2 -  PCV) Never done   Zoster Vaccines- Shingrix (1 of 2) Never done   COLON CANCER SCREENING ANNUAL FOBT  06/02/2015   Colonoscopy  04/25/2016   COVID-19 Vaccine (3 - 2024-25 season) 07/01/2023   Cervical Cancer Screening (HPV/Pap Cotest)  02/13/2024   INFLUENZA VACCINE  05/30/2024   MAMMOGRAM  03/20/2025   DTaP/Tdap/Td (3 - Td or Tdap) 11/15/2032   Hepatitis C Screening  Completed   HIV Screening  Completed   HPV VACCINES  Aged Out   Meningococcal B Vaccine  Aged Out    Discussed health benefits of physical activity, and encouraged her to engage in regular exercise appropriate for her age and condition.  Problem List Items Addressed This Visit     Depression   Patient with significant history of tobacco use, down to a few cigarettes a day. Has been struggling with mood and depression. Mostly distraught that boyfriend has threatened to leave her if she does not regular her mood better. States he has angry outbursts, difficult to control emotions. Per patient, she was diagnosed with Bipolar disorder manic depressive type while getting worked up for social disability last year. Does not currently go to a psychiatrist or take any antipsychotics. Patient has found integrated behavioral health sessions with Amie Bald helpful, has not had a recent session. Discussed options, patient willing to reschedule sessions with [facility], start antidepressant therapy with Wellbutrin which can help with smoking cessation as well, and a psychiatry referral for further evaluation of possible concomitant mood disorder.  Plan - START Wellbutrin/Bupropion 150 mg (half a tablet) for one week then go up to 300 mg daily. Discussed common side effects and mental crisis resources.  - Follow up with Cre[facility] Doctor for additional behavioral health sessions -  Follow up on psychiatry referral      Relevant Medications   buPROPion (WELLBUTRIN XL) 300 MG 24 hr tablet   Other Relevant Orders   Ambulatory referral to Psychiatry   Polysubstance abuse (with elevated transaminases)    Patient endorses continued cocaine use. Last use 2 days ago. Down to about 2 drinks a day, mostly beers. Wearing nicotine patches to help with smoking cessation, down to a few cigarettes a day. Encouraged continued sessions with Amie Bald for support. Patient also willing to go to group sessions to help with substance use, especially cocaine use, but typically prefers 1 on 1 sessions.  Plan - Continue substance use counseling - Patient to follow up with Cre[facility] Doctor and other community resources for substance use  - Will follow up in 1 month       Health care maintenance   Patient overdue for colonoscopy. Willing to get FIT testing, will pick up kit today.  Plan - Follow up on FIT testing as needed       Difficulty sleeping   Patient used to take Trazodone to help with sleep (has difficulty getting to sleep), has not taken in some time. Discussed importance of getting rid of any leftover medication safely. Patient agreeable to trying Melatonin.  Plan - Patient to try taking Melatonin 4 mg - 6 mg 30 minutes before bedtime to help with sleep - Follow up in 1 month       Other Visit Diagnoses       Colon cancer screening    -  Primary   Relevant Orders   Fecal occult blood, imunochemical      Patient discussed with Dr. Adriane Albe.  Return in about 4 weeks (around 03/07/2024) for depression, specialist referrals (  GI for dysphagia, Psychiatry for mood).    Rondall Radigan Arellano Zameza, MD

## 2024-02-10 NOTE — Progress Notes (Signed)
 Internal Medicine Clinic Attending  Case discussed with the resident at the time of the visit.  We reviewed the resident's history and exam and pertinent patient test results.  I agree with the assessment, diagnosis, and plan of care documented in the resident's note.

## 2024-02-10 NOTE — Assessment & Plan Note (Signed)
 Patient endorses continued cocaine use. Last use 2 days ago. Down to about 2 drinks a day, mostly beers. Wearing nicotine patches to help with smoking cessation, down to a few cigarettes a day. Encouraged continued sessions with Amie Bald for support. Patient also willing to go to group sessions to help with substance use, especially cocaine use, but typically prefers 1 on 1 sessions.  Plan - Continue substance use counseling - Patient to follow up with Creasie Doctor and other community resources for substance use  - Will follow up in 1 month

## 2024-02-14 ENCOUNTER — Ambulatory Visit: Admitting: Licensed Clinical Social Worker

## 2024-02-19 ENCOUNTER — Ambulatory Visit: Payer: MEDICAID | Admitting: Physician Assistant

## 2024-02-25 ENCOUNTER — Ambulatory Visit: Payer: Self-pay

## 2024-02-25 ENCOUNTER — Ambulatory Visit: Payer: MEDICAID | Attending: Internal Medicine

## 2024-02-25 ENCOUNTER — Ambulatory Visit (INDEPENDENT_AMBULATORY_CARE_PROVIDER_SITE_OTHER): Payer: MEDICAID | Admitting: Student

## 2024-02-25 VITALS — BP 130/80 | HR 69 | Temp 97.9°F | Ht 64.0 in | Wt 140.3 lb

## 2024-02-25 DIAGNOSIS — R55 Syncope and collapse: Secondary | ICD-10-CM

## 2024-02-25 DIAGNOSIS — J984 Other disorders of lung: Secondary | ICD-10-CM

## 2024-02-25 MED ORDER — TRELEGY ELLIPTA 100-62.5-25 MCG/ACT IN AEPB
1.0000 | INHALATION_SPRAY | Freq: Every day | RESPIRATORY_TRACT | Status: DC
Start: 2024-02-25 — End: 2024-05-26

## 2024-02-25 NOTE — Patient Instructions (Signed)
 Thank you, [name] for allowing us  to provide your care today. Today we discussed:  I advise you to cut down on drinking alcohol  and cocaine.   STOP Wellbutrin    I will reach out to  your neurologist so you can see them sooner.   You will be mailed a heart monitor to record heart beats.   Seizure Precautions -  Avoid unsupervised activities that might pose danger with sudden loss of consciousness, including bathing, swimming alone, working at heights, and operating heavy machinery  - Driving restrictions  for 6 months  - If you have another event of passing out, go to emergency department.    I have ordered the following labs for you:  Lab Orders         CMP14 + Anion Gap       Tests ordered today:  CMP today   Referrals ordered today:   Referral Orders  No referral(s) requested today     I have ordered the following medication/changed the following medications:   Stop the following medications: Medications Discontinued During This Encounter  Medication Reason   buPROPion  (WELLBUTRIN  XL) 300 MG 24 hr tablet    traZODone  (DESYREL ) 50 MG tablet    Fluticasone-Umeclidin-Vilant (TRELEGY ELLIPTA ) 100-62.5-25 MCG/ACT AEPB Reorder     Start the following medications: Meds ordered this encounter  Medications   Fluticasone-Umeclidin-Vilant (TRELEGY ELLIPTA ) 100-62.5-25 MCG/ACT AEPB    Sig: Inhale 1 puff into the lungs daily.    Lot Number?:   [id number]     Follow up:  Sooner if needed      Remember:   Should you have any questions or concerns please call the internal medicine clinic at [phone].     Lanney Pitts, DO [facility]

## 2024-02-25 NOTE — Progress Notes (Signed)
 Acute Office Visit  Subjective:     [name], female    DOB: June 25, 1961, 63 y.o.   MRN: [id number]  Chief Complaint  Patient presents with   Medication Problem    HPI This is a 63 year old female with PMHx of HF, PAD, GERD, depression, polysubstance cocaine use and alcohol  abuse, presents for an acute visit for syncope.    ROS    As per assessment and plan  Objective:    BP 130/80 (BP Location: Right Arm, Cuff Size: Small)   Pulse 69   Temp 97.9 F (36.6 C) (Oral)   Ht 5\' 4"  (1.626 m)   Wt 140 lb 4.8 oz (63.6 kg)   LMP 11/02/2010   SpO2 100%   BMI 24.08 kg/m  BP Readings from Last 3 Encounters:  02/25/24 130/80  02/08/24 136/68  01/14/24 110/66   Wt Readings from Last 3 Encounters:  02/25/24 140 lb 4.8 oz (63.6 kg)  02/08/24 139 lb 1.6 oz (63.1 kg)  01/14/24 141 lb (64 kg)   SpO2 Readings from Last 3 Encounters:  02/25/24 100%  02/08/24 97%  01/14/24 99%     Physical Exam  General: Sitting in chair, no acute distress Neuro: CN II to XII intact. PERRL. EOMI. 5/5 grip strength bilaterally. Sensation is intact.  Cardiovascular: Regular rate, no murmurs appreciated Pulmonary: Breathing comfortably, no wheezing or crackles Abdomen: Soft, nontender, nondistended, bowel sounds present MSK: Range of motion intact, no lower extremity edema   Assessment & Plan:  Patient is discussed with Dr Ancil Balzarine  Problem List Items Addressed This Visit       Other   Syncope - Primary   Reports that on Saturday 3:00 PM, she was sitting with her friends, and they noticed that patient passed out. They had to "smacked her really hard and put water" to wake her up. It lasted for couple of minutes. She reports prior to passing out, her words were slurring as noted by her friends. Friends reported that she looked like she had a seizure, possibly shaking. No bowel or bladder incontinece. Noted initial leg weakness, but resolved. No other episodes passing out since  Saturday. No post-ictal state.   She reports prior history of seizures notably from alcohol  withdrawals several years ago. Presently, she is drinking 40 ounces of beer every other day. Does report that she had too much to drink last week either on Thursday or Friday where she drank Liquor, Beers and smoked cocaine.   She was recently started on Wellbutrin  150 mg on 02/08/2024 for depression, then increased it to 300 mg last Tuesday. She denies any prodrome symptoms.   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   Per exam, CN II - XII intact, PERRLA, EOMI, 5/5 strength b/l UE and LE. Speech intact. Regular Rate.   Differentials include but not limited to alcohol  withdrawal, Wellbutrin  decreasing seizure threshold, arrhythmias. Orthostatics negative.   Plan: - STOP Wellbutrin  - Ordered Xio Patch, 14 days monitor to catch for any arrhythmias - She has a follow up with Dr Gracie Lav on 06/12, will contact to reschedule early for EEG - Counseled on alcohol  and cocaine cessation  - Counseled on seizure precautions - Counseled, if she has another episode, to be evaluated in ED       Relevant Orders   CMP14 + Anion Gap   LONG TERM MONITOR (3-14 DAYS)   Other Visit Diagnoses  Restrictive lung disease       Relevant Medications   Fluticasone-Umeclidin-Vilant (TRELEGY ELLIPTA ) 100-62.5-25 MCG/ACT AEPB       Meds ordered this encounter  Medications   Fluticasone-Umeclidin-Vilant (TRELEGY ELLIPTA ) 100-62.5-25 MCG/ACT AEPB    Sig: Inhale 1 puff into the lungs daily.    Lot Number?:   [id number]    Return if symptoms worsen or fail to improve.  Lanney Pitts, DO

## 2024-02-25 NOTE — Progress Notes (Unsigned)
 EP to read.

## 2024-02-25 NOTE — Telephone Encounter (Signed)
 Pt has an appt this afternoon with Dr Hartwell Linea.

## 2024-02-25 NOTE — Telephone Encounter (Signed)
  Chief Complaint: Fainted Symptoms: loss of consciousness Frequency: brief X 1 Pertinent Negatives: Patient denies injury, fever, headache, lightheadedness, chest pain, dizziness, shortness of breath, fever, pain Disposition: [] ED /[] Urgent Care (no appt availability in office) / [x] Appointment(In office/virtual)/ []  [facility]/ [] Home Care/ [] Refused Recommended Disposition /[] [facility]/ []  Follow-up with PCP Additional Notes:  On Saturday she was out with friends when she lost consciousness for short amount of time, she was sitting did not fall. She did not seek emergency evaluation. Friend said she might have had a seizure. They had to "slap her around" and 'throw water at her" to get her to come back to consciousness. She believes this is due to Wellbutrin  that she started 02/08/24. She was feeling well prior to episode, she was oriented when she came too but was confused by what happened.  She feels completely normal and fine now. Denies any symptoms at this time. She has continued her Wellbutrin  dosing. Acute visit scheduled today. Educated on care advice as documented in protocol, patient verbalized understanding. Discussed reasons to call back or call for EMS.    Copied from CRM [id number]. Topic: Clinical - Red Word Triage >> Feb 25, 2024 10:21 AM Danelle Dunning F wrote: [facility] that prompted transfer to Nurse Triage:   [name] out; possibly had a seizure; The episode occurred on Saturday; Patient believes it is a result of taking buPROPion  (WELLBUTRIN  XL) 300 MG 24 hr tablet Reason for Disposition  [1] All other patients AND [2] now alert and feels fine  (Exception: SIMPLE FAINT due to stress, pain, prolonged standing, or suddenly standing)  Protocols used: Fainting-A-AH

## 2024-02-25 NOTE — Assessment & Plan Note (Signed)
 Reports that on Saturday 3:00 PM, she was sitting with her friends, and they noticed that patient passed out. They had to "smacked her really hard and put water" to wake her up. It lasted for couple of minutes. She reports prior to passing out, her words were slurring as noted by her friends. Friends reported that she looked like she had a seizure, possibly shaking. No bowel or bladder incontinece. Noted initial leg weakness, but resolved. No other episodes passing out since Saturday. No post-ictal state.   She reports prior history of seizures notably from alcohol  withdrawals several years ago. Presently, she is drinking 40 ounces of beer every other day. Does report that she had too much to drink last week either on Thursday or Friday where she drank Liquor, Beers and smoked cocaine.   She was recently started on Wellbutrin  150 mg on 02/08/2024 for depression, then increased it to 300 mg last Tuesday. She denies any prodrome symptoms.   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   Per exam, CN II - XII intact, PERRLA, EOMI, 5/5 strength b/l UE and LE. Speech intact. Regular Rate.   Differentials include but not limited to alcohol  withdrawal, Wellbutrin  decreasing seizure threshold, arrhythmias. Orthostatics negative.   Plan: - STOP Wellbutrin  - Ordered Xio Patch, 14 days monitor to catch for any arrhythmias - She has a follow up with Dr Gracie Lav on 06/12, will contact to reschedule early for EEG - Counseled on alcohol  and cocaine cessation  - Counseled on seizure precautions - Counseled, if she has another episode, to be evaluated in ED

## 2024-02-27 LAB — CMP14 + ANION GAP
ALT: 12 IU/L (ref 0–32)
AST: 13 IU/L (ref 0–40)
Albumin: 4.3 g/dL (ref 3.9–4.9)
Alkaline Phosphatase: 99 IU/L (ref 44–121)
Anion Gap: 15 mmol/L (ref 10.0–18.0)
BUN/Creatinine Ratio: 23 (ref 12–28)
BUN: 21 mg/dL (ref 8–27)
Bilirubin Total: 0.2 mg/dL (ref 0.0–1.2)
CO2: 21 mmol/L (ref 20–29)
Calcium: 8.8 mg/dL (ref 8.7–10.3)
Chloride: 108 mmol/L — ABNORMAL HIGH (ref 96–106)
Creatinine, Ser: 0.93 mg/dL (ref 0.57–1.00)
Globulin, Total: 2.6 g/dL (ref 1.5–4.5)
Glucose: 76 mg/dL (ref 70–99)
Potassium: 4.5 mmol/L (ref 3.5–5.2)
Sodium: 144 mmol/L (ref 134–144)
Total Protein: 6.9 g/dL (ref 6.0–8.5)
eGFR: 70 mL/min/{1.73_m2} (ref >59)

## 2024-02-28 NOTE — Progress Notes (Signed)
 Internal Medicine Clinic Attending  Case discussed with the resident at the time of the visit.  We reviewed the resident's history and exam and pertinent patient test results.  I agree with the assessment, diagnosis, and plan of care documented in the resident's note.

## 2024-03-03 ENCOUNTER — Telehealth: Payer: Self-pay

## 2024-03-03 NOTE — Telephone Encounter (Signed)
 Call to patient, she accepts sooner appt in [place]

## 2024-03-21 ENCOUNTER — Ambulatory Visit: Payer: MEDICAID

## 2024-03-27 ENCOUNTER — Encounter: Payer: Self-pay | Admitting: Neurology

## 2024-03-27 ENCOUNTER — Ambulatory Visit: Payer: MEDICAID | Admitting: Neurology

## 2024-03-27 VITALS — BP 122/73 | HR 77 | Ht 63.0 in | Wt 140.0 lb

## 2024-03-27 DIAGNOSIS — R404 Transient alteration of awareness: Secondary | ICD-10-CM | POA: Diagnosis not present

## 2024-03-27 DIAGNOSIS — I639 Cerebral infarction, unspecified: Secondary | ICD-10-CM | POA: Diagnosis not present

## 2024-03-27 NOTE — Progress Notes (Signed)
 Chief Complaint  Patient presents with   New Patient (Initial Visit)    RM 14. NP/internal referral for neuropathy, hx CVA.  Neuropathy: Pt reports numbness in hands and feet 3-4 times a week - pt is usually able to shake off th numbness.  HX OF STROKE: Pt reports left leg is constantly numb and cold ever since stroke.       ASSESSMENT AND PLAN  [name] is a 63 y.o. female   History of right MCA stroke Passing out episode  Vascular risk factor of hypertension, hyperlipidemia polysubstance abuse, most recent UDS was positive for cocaine in December 2024, also had a history of alcohol , tobacco abuse,  Reported passing out episode most worrisome for partial seizure,  Repeat MRI of the brain, EEG   High risk for noncompliance, also with poor social support, will only do aspirin  81 mg as stroke prevention, only start antiepileptic medications if EEG showed any significant abnormality  She has never driven in her life  Return To Clinic With NP In 6 Months   DIAGNOSTIC DATA (LABS, IMAGING, TESTING) - I reviewed patient records, labs, notes, testing and imaging myself where available.   MEDICAL HISTORY:  [name], is a 63 year old female, seen in request by   Amber Dixie, MD [address] [place], [address] [place],  [place] [address], Ronni Colace, DO   History is obtained from the patient and review of electronic medical records. I personally reviewed pertinent available imaging films in PACS.   PMHx of  GERD HTN HLD Hx of DVT, PE Hx of cocaine, alcohol , smoke, marijuana Hx of seizure, related to alcohol  withdraw, last seizure was more than 9 years ago, not on AED.  History of MI in 1994,   Patient is alone at today's clinical visit, poor historian, lives with her daughter, going to move, never drive in her life, lives off Social Security  She had a history of stroke-many years ago, it was visible in her CT head from 2003, she could not elaborate on the  detail, but had residual left side numbness since the stroke, her left-sided symptoms continue to getting worse over the years, she also noticed mild weakness on the left side  Most recent MRI of the brain and cervical spine was from September 2018, stable MRI appearance since 2008 with chronic encephalomalacia involving large portion of the right MCA territory, small distal left PCA territory, essentially normal MRI of cervical spine  She also had a history of seizure, but is not on antiepileptic medications, reported previous seizures associated with alcohol  use, but couple weeks ago, her friend witnessed a sudden onset of passing out episode, she has no warning signs,   UDS in December 2024 was positive for cocaine, essentially normal CMP in April 2025  PHYSICAL EXAM:   Vitals:   03/27/24 1534  BP: 122/73  Pulse: 77  Weight: 140 lb (63.5 kg)  Height: 5\' 3"  (1.6 m)     Body mass index is 24.8 kg/m.  PHYSICAL EXAMNIATION:  Gen: NAD, conversant, well nourised, well groomed                     Cardiovascular: Regular rate rhythm, no peripheral edema, warm, nontender. Eyes: Conjunctivae clear without exudates or hemorrhage Neck: Supple, no carotid bruits. Pulmonary: Clear to auscultation bilaterally   NEUROLOGICAL EXAM:  MENTAL STATUS: Speech/cognition: Awake, poor historian, difficulty following three-step commands, CRANIAL NERVES: CN II: Left Hemi visual field deficit pupils  are round equal and briskly reactive to light. CN III, IV, VI: extraocular movement are normal. No ptosis. CN V: Facial sensation is intact to light touch CN VII: Face is symmetric with normal eye closure  CN VIII: Hearing is normal to causal conversation. CN IX, X: Phonation is normal. CN XI: Head turning and shoulder shrug are intact  MOTOR: Mild fixation of left upper extremity on rapid rotating movement  REFLEXES: Hyperreflexia  SENSORY: Intact to light touch  COORDINATION: There is no trunk  or limb dysmetria noted.  GAIT/STANCE: Posture is normal. Gait is steady    REVIEW OF SYSTEMS:  Full 14 system review of systems performed and notable only for as above All other review of systems were negative.   ALLERGIES: Allergies  Allergen Reactions   Iodinated Contrast Media Other (See Comments)    Seizure   Omnipaque [Iohexol] Other (See Comments)    Seizure     HOME MEDICATIONS: Current Outpatient Medications  Medication Sig Dispense Refill   Acetaminophen  Extra Strength 500 MG CAPS Take 1 capsule by mouth 3 (three) times daily.     albuterol  (VENTOLIN  HFA) 108 (90 Base) MCG/ACT inhaler Inhale 2 puffs into the lungs every 4 (four) hours as needed for wheezing or shortness of breath. 6.7 g 0   aspirin  EC 81 MG tablet Take 1 tablet (81 mg total) by mouth daily. Swallow whole. 90 tablet 3   atorvastatin  (LIPITOR) 40 MG tablet Take 1 tablet (40 mg total) by mouth daily. 90 tablet 3   carvedilol  (COREG ) 6.25 MG tablet Take 1 tablet (6.25 mg total) by mouth 2 (two) times daily. 180 tablet 3   Fluticasone-Umeclidin-Vilant (TRELEGY ELLIPTA ) 100-62.5-25 MCG/ACT AEPB Inhale 1 puff into the lungs daily.     furosemide  (LASIX ) 20 MG tablet Take 1 tablet (20 mg total) by mouth daily as needed. 30 tablet 1   gabapentin  (NEURONTIN ) 300 MG capsule Take 1 capsule (300 mg total) by mouth 3 (three) times daily. 270 capsule 3   nitroGLYCERIN  (NITROSTAT ) 0.4 MG SL tablet Place 1 tablet (0.4 mg total) under the tongue every 5 (five) minutes as needed for chest pain. 25 tablet 1   omeprazole  (PRILOSEC  OTC) 20 MG tablet Take 1 tablet (20 mg total) by mouth daily. 90 tablet 2   baclofen  (LIORESAL ) 10 MG tablet Take 1 tablet (10 mg total) by mouth 3 (three) times daily. (Patient not taking: Reported on 03/27/2024) 30 each 0   Cholecalciferol  25 MCG (1000 UT) CHEW Chew 1 tablet (1,000 Units total) by mouth daily. (Patient not taking: Reported on 03/27/2024) 90 tablet 0   diclofenac  Sodium (VOLTAREN ) 1 %  GEL Apply 2 g topically 4 (four) times daily. (Patient not taking: Reported on 03/27/2024) 100 g 0   No current facility-administered medications for this visit.    PAST MEDICAL HISTORY: Past Medical History:  Diagnosis Date   Alcoholism (HCC)    ANEMIA, VITAMIN B12 DEFICIENCY 11/11/2007   Documented low 175 on 09/2007; received 6 yrs of IM therapy; switched to po 05/2013    Anxiety    Breast pain, left 06/02/2021   Cardiac arrest (HCC) 01/16/2013   witnessed, shockable rhythm, initally low EF, no CAD at cath. Drug screen negative.   Chest pain    a. 04/2009 Echo: EF 55-60%, Gr1 DD, Mild MR.   Depression    Duodenitis    secondary to H pylori(treatment completed 05/2006), +/- NSAIDS   Duodenitis    a. 05/2006 2/2 H pylori +/-  NSAIDS   DVT (deep venous thrombosis) (HCC)    a. 04/2009 - treated with coumadin x 8 mos.   ETOH abuse    GERD (gastroesophageal reflux disease)    Heart murmur    History of cocaine abuse (HCC)    History of CVA (cerebrovascular accident)    Hypertension    Left arm pain 06/11/2017   PE 05/16/2009   CT angio positive for PE in 07/10, treated with coumadin for 8 months.    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 04/2009 - treated with coumadin x 8 mos.   Rectal bleeding 2007   internal hemmorhoids by colonoscopy in 04/2006, Bx neg for IBD   Rectal bleeding    a. int hemorrhoids by colonoscopy in 04/2006, Bx neg for IBD   Right foot pain 05/05/2020   Seizures (HCC)    Shortness of breath 05/28/2020   Stroke (HCC)    Tobacco abuse    Trochanteric bursitis of left hip 05/22/2016    PAST SURGICAL HISTORY: Past Surgical History:  Procedure Laterality Date   CESAREAN SECTION     LEFT HEART CATHETERIZATION WITH CORONARY ANGIOGRAM N/A 06/23/2014   Procedure: LEFT HEART CATHETERIZATION WITH CORONARY ANGIOGRAM;  Surgeon: Darlis Eisenmenger, MD; No CAD    FAMILY HISTORY: Family History  Problem Relation Age of Onset   Cancer Mother    Breast  cancer Mother 48   Heart disease Mother    Diabetes Mother    CAD Mother    Heart attack Mother    Hypertension Mother    Cancer Father    Heart disease Father    Colon cancer Father    CAD Father    Cancer Sister    Breast cancer Sister 39   Diabetes Sister    Hypertension Sister    Heart disease Brother    Diabetes Brother    CAD Brother    Breast cancer Niece 63   Coronary artery disease Other        1 st degree female relative <60 and female <50   Diabetes type II Other        1 st degree relative   Colon polyps Neg Hx    Esophageal cancer Neg Hx    Rectal cancer Neg Hx    Stomach cancer Neg Hx     SOCIAL HISTORY: Social History   Socioeconomic History   Marital status: Single    Spouse name: Not on file   Number of children: 2   Years of education: Not on file   Highest education level: 11th grade  Occupational History   Occupation: Unemployed  Tobacco Use   Smoking status: Some Days    Current packs/day: 0.10    Types: Cigarettes   Smokeless tobacco: Never   Tobacco comments:    smokes about 1-2 cigs/day.  Has patches   Vaping Use   Vaping status: Never Used  Substance and Sexual Activity   Alcohol  use: Yes    Comment: beer   Drug use: Yes    Types: Marijuana, "Crack" cocaine   Sexual activity: Yes    Partners: Male    Birth control/protection: Post-menopausal  Other Topics Concern   Not on file  Social History Narrative   ** Merged History Encounter **       ** Data from: 12/05/12 Enc Dept: [facility]   No cocaine or alcohol  use since hospital discharge 05/19/2009.  10/13 update:  States she is smoking 1-2 cigarettes daily with at least 1 drink daily usually Brandy.                  ** Data from: 01/17/13 Enc Dept: [facility]   Pt lives in [place] with a roommate.      Social Drivers of Corporate investment banker Strain: Low Risk  (03/14/2023)   Overall Financial Resource Strain (CARDIA)    Difficulty of Paying Living  Expenses: Not hard at all  Food Insecurity: No Food Insecurity (03/14/2023)   Hunger Vital Sign    Worried About Running Out of Food in the Last Year: Never true    Ran Out of Food in the Last Year: Never true  Transportation Needs: Unmet Transportation Needs (03/14/2023)   PRAPARE - Transportation    Lack of Transportation (Medical): Yes    Lack of Transportation (Non-Medical): Yes  Physical Activity: Sufficiently Active (03/14/2023)   Exercise Vital Sign    Days of Exercise per Week: 7 days    Minutes of Exercise per Session: 30 min  Stress: No Stress Concern Present (03/14/2023)   [employer] of Occupational Health - Occupational Stress Questionnaire    Feeling of Stress : Not at all  Social Connections: Moderately Isolated (03/14/2023)   Social Connection and Isolation Panel [NHANES]    Frequency of Communication with Friends and Family: More than three times a week    Frequency of Social Gatherings with Friends and Family: Once a week    Attends Religious Services: More than 4 times per year    Active Member of [employer] or Organizations: No    Attends Banker Meetings: Never    Marital Status: Never married  Intimate Partner Violence: Not At Risk (03/14/2023)   Humiliation, Afraid, Rape, and Kick questionnaire    Fear of Current or Ex-Partner: No    Emotionally Abused: No    Physically Abused: No    Sexually Abused: No      Phebe Brasil, M.D. Ph.D.  [facility] [address] [place], [place] [address] Ph: [phone] Fax: [phone]  CC:  Amber Dixie, MD [address] [place], [address] [place],  [place] [address]  Ronni Colace, DO

## 2024-03-29 DIAGNOSIS — R55 Syncope and collapse: Secondary | ICD-10-CM | POA: Diagnosis not present

## 2024-04-02 ENCOUNTER — Telehealth: Payer: Self-pay | Admitting: Neurology

## 2024-04-02 NOTE — Telephone Encounter (Signed)
 [name]: [id number] exp. 03/31/2024-05/30/2024 sent to [facility] [phone]

## 2024-04-07 ENCOUNTER — Ambulatory Visit: Payer: MEDICAID | Admitting: Neurology

## 2024-04-07 DIAGNOSIS — R404 Transient alteration of awareness: Secondary | ICD-10-CM

## 2024-04-07 DIAGNOSIS — R4182 Altered mental status, unspecified: Secondary | ICD-10-CM

## 2024-04-07 DIAGNOSIS — I639 Cerebral infarction, unspecified: Secondary | ICD-10-CM

## 2024-04-10 ENCOUNTER — Ambulatory Visit: Payer: MEDICAID | Admitting: Neurology

## 2024-04-14 ENCOUNTER — Other Ambulatory Visit (INDEPENDENT_AMBULATORY_CARE_PROVIDER_SITE_OTHER): Payer: MEDICAID

## 2024-04-14 ENCOUNTER — Ambulatory Visit: Payer: Self-pay | Admitting: Gastroenterology

## 2024-04-14 ENCOUNTER — Encounter: Payer: Self-pay | Admitting: Gastroenterology

## 2024-04-14 ENCOUNTER — Ambulatory Visit (INDEPENDENT_AMBULATORY_CARE_PROVIDER_SITE_OTHER): Payer: MEDICAID | Admitting: Gastroenterology

## 2024-04-14 VITALS — BP 122/68 | HR 63 | Ht 63.0 in | Wt 139.0 lb

## 2024-04-14 DIAGNOSIS — R1310 Dysphagia, unspecified: Secondary | ICD-10-CM | POA: Diagnosis not present

## 2024-04-14 DIAGNOSIS — K921 Melena: Secondary | ICD-10-CM

## 2024-04-14 DIAGNOSIS — K219 Gastro-esophageal reflux disease without esophagitis: Secondary | ICD-10-CM

## 2024-04-14 DIAGNOSIS — K5909 Other constipation: Secondary | ICD-10-CM | POA: Diagnosis not present

## 2024-04-14 DIAGNOSIS — Z1211 Encounter for screening for malignant neoplasm of colon: Secondary | ICD-10-CM

## 2024-04-14 LAB — CBC WITH DIFFERENTIAL/PLATELET
Basophils Absolute: 0.1 10*3/uL (ref 0.0–0.1)
Basophils Relative: 0.9 % (ref 0.0–3.0)
Eosinophils Absolute: 0.1 10*3/uL (ref 0.0–0.7)
Eosinophils Relative: 0.8 % (ref 0.0–5.0)
HCT: 36.9 % (ref 36.0–46.0)
Hemoglobin: 11.9 g/dL — ABNORMAL LOW (ref 12.0–15.0)
Lymphocytes Relative: 25.8 % (ref 12.0–46.0)
Lymphs Abs: 2.1 10*3/uL (ref 0.7–4.0)
MCHC: 32.2 g/dL (ref 30.0–36.0)
MCV: 86.8 fl (ref 78.0–100.0)
Monocytes Absolute: 0.6 10*3/uL (ref 0.1–1.0)
Monocytes Relative: 7.3 % (ref 3.0–12.0)
Neutro Abs: 5.2 10*3/uL (ref 1.4–7.7)
Neutrophils Relative %: 65.2 % (ref 43.0–77.0)
Platelets: 246 10*3/uL (ref 150.0–400.0)
RBC: 4.25 Mil/uL (ref 3.87–5.11)
RDW: 14.6 % (ref 11.5–15.5)
WBC: 8 10*3/uL (ref 4.0–10.5)

## 2024-04-14 LAB — IBC + FERRITIN
Ferritin: 210.1 ng/mL (ref 10.0–291.0)
Iron: 112 ug/dL (ref 42–145)
Saturation Ratios: 38.3 % (ref 20.0–50.0)
TIBC: 292.6 ug/dL (ref 250.0–450.0)
Transferrin: 209 mg/dL — ABNORMAL LOW (ref 212.0–360.0)

## 2024-04-14 LAB — COMPREHENSIVE METABOLIC PANEL WITH GFR
ALT: 17 U/L (ref 0–35)
AST: 22 U/L (ref 0–37)
Albumin: 4.4 g/dL (ref 3.5–5.2)
Alkaline Phosphatase: 84 U/L (ref 39–117)
BUN: 16 mg/dL (ref 6–23)
CO2: 25 meq/L (ref 19–32)
Calcium: 9.4 mg/dL (ref 8.4–10.5)
Chloride: 108 meq/L (ref 96–112)
Creatinine, Ser: 0.71 mg/dL (ref 0.40–1.20)
GFR: 91.33 mL/min (ref >60.00)
Glucose, Bld: 114 mg/dL — ABNORMAL HIGH (ref 70–99)
Potassium: 4.2 meq/L (ref 3.5–5.1)
Sodium: 141 meq/L (ref 135–145)
Total Bilirubin: 0.9 mg/dL (ref 0.2–1.2)
Total Protein: 7.2 g/dL (ref 6.0–8.3)

## 2024-04-14 MED ORDER — OMEPRAZOLE 40 MG PO CPDR: 40.0000 mg | DELAYED_RELEASE_CAPSULE | Freq: Every day | ORAL | 3 refills | Status: DC

## 2024-04-14 NOTE — Progress Notes (Unsigned)
 [name]

## 2024-04-14 NOTE — Progress Notes (Signed)
 Chief Complaint: Dysphagia Primary GI MD: Para Bold  HPI: Discussed the use of AI scribe software for clinical note transcription with the patient, who gave verbal consent to proceed.  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  She experiences trouble swallowing, which she attributes to her history of acid reflux. The sensation is described as a 'little dry hacking cough' that is particularly bothersome at night. She avoids citrus fruits and spicy foods to manage her symptoms. She experiences burning in her chest and feels as though food and medication sometimes get stuck, requiring her to massage her neck to help them go down. This occurs a couple of times a week, and she tries to mitigate it by taking her pills with a lot of water, which helps.  She is currently taking omeprazole  20 mg once a day for her acid reflux. Despite this, she continues to experience symptoms of acid reflux at night, including burning in her chest. She stays away from acidic and spicy foods to help manage her symptoms.  She has a history of heart failure and was in a coma for fifteen days, during which she had an endotracheal tube placed. She is concerned that this may have contributed to her current swallowing difficulties.  She mentions experiencing constipation and occasional dark stools, which she attributes to her constipation. She plans to resume taking Miralax to help with her bowel movements. No black and sticky stools. She denies taking Pepto-Bismol or iron supplements.  She acknowledges a diagnosis of COPD and mentions that she needs to quit smoking cigarettes, which she believes contributes to her symptoms, including bloating and a swollen stomach.   Past Medical History:  Diagnosis Date    Alcoholism (HCC)    ANEMIA, VITAMIN B12 DEFICIENCY 11/11/2007   Documented low 175 on 09/2007; received 6 yrs of IM therapy; switched to po 05/2013    Anxiety    Breast pain, left 06/02/2021   Cardiac arrest (HCC) 01/16/2013   witnessed, shockable rhythm, initally low EF, no CAD at cath. Drug screen negative.   Chest pain    a. 04/2009 Echo: EF 55-60%, Gr1 DD, Mild MR.   Depression    Duodenitis    secondary to H pylori(treatment completed 05/2006), +/- NSAIDS   Duodenitis    a. 05/2006 2/2 H pylori +/- NSAIDS   DVT (deep venous thrombosis) (HCC)    a. 04/2009 - treated with coumadin x 8 mos.   ETOH abuse    GERD (gastroesophageal reflux disease)    Heart murmur    History of cocaine abuse (HCC)    History of CVA (cerebrovascular accident)    Hypertension    Left arm pain 06/11/2017   PE 05/16/2009   CT angio positive for PE in 07/10, treated with coumadin for 8 months.    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 04/2009 - treated with coumadin x 8 mos.   Rectal bleeding 2007   internal hemmorhoids by colonoscopy in 04/2006, Bx neg for IBD   Rectal bleeding    a. int hemorrhoids by colonoscopy in 04/2006, Bx neg for IBD   Right foot pain 05/05/2020   Seizures (HCC)    Shortness of breath 05/28/2020   Stroke [facility])    Stroke [facility])    Tobacco  abuse    Trochanteric bursitis of left hip 05/22/2016    Past Surgical History:  Procedure Laterality Date   CESAREAN SECTION     LEFT HEART CATHETERIZATION WITH CORONARY ANGIOGRAM N/A 06/23/2014   Procedure: LEFT HEART CATHETERIZATION WITH CORONARY ANGIOGRAM;  Surgeon: Darlis Eisenmenger, MD; No CAD    Current Outpatient Medications  Medication Sig Dispense Refill   Acetaminophen  Extra Strength 500 MG CAPS Take 1 capsule by mouth 3 (three) times daily.     albuterol  (VENTOLIN  HFA) 108 (90 Base) MCG/ACT inhaler Inhale 2 puffs into the lungs every 4 (four) hours as needed for wheezing or shortness of breath. 6.7 g 0   aspirin  EC 81  MG tablet Take 1 tablet (81 mg total) by mouth daily. Swallow whole. 90 tablet 3   atorvastatin  (LIPITOR) 40 MG tablet Take 1 tablet (40 mg total) by mouth daily. 90 tablet 3   carvedilol  (COREG ) 6.25 MG tablet Take 1 tablet (6.25 mg total) by mouth 2 (two) times daily. 180 tablet 3   Fluticasone-Umeclidin-Vilant (TRELEGY ELLIPTA ) 100-62.5-25 MCG/ACT AEPB Inhale 1 puff into the lungs daily.     furosemide  (LASIX ) 20 MG tablet Take 1 tablet (20 mg total) by mouth daily as needed. 30 tablet 1   gabapentin  (NEURONTIN ) 300 MG capsule Take 1 capsule (300 mg total) by mouth 3 (three) times daily. 270 capsule 3   nitroGLYCERIN  (NITROSTAT ) 0.4 MG SL tablet Place 1 tablet (0.4 mg total) under the tongue every 5 (five) minutes as needed for chest pain. 25 tablet 1   omeprazole  (PRILOSEC ) 40 MG capsule Take 1 capsule (40 mg total) by mouth daily. 30 capsule 3   baclofen  (LIORESAL ) 10 MG tablet Take 1 tablet (10 mg total) by mouth 3 (three) times daily. (Patient not taking: Reported on 04/14/2024) 30 each 0   Cholecalciferol  25 MCG (1000 UT) CHEW Chew 1 tablet (1,000 Units total) by mouth daily. (Patient not taking: Reported on 04/14/2024) 90 tablet 0   diclofenac  Sodium (VOLTAREN ) 1 % GEL Apply 2 g topically 4 (four) times daily. (Patient not taking: Reported on 04/14/2024) 100 g 0   No current facility-administered medications for this visit.    Allergies as of 04/14/2024 - Review Complete 04/14/2024  Allergen Reaction Noted   Iodinated contrast media Other (See Comments) 01/16/2013   Omnipaque [iohexol] Other (See Comments) 12/26/2010    Family History  Problem Relation Age of Onset   Cancer Mother    Breast cancer Mother 60   Heart disease Mother    Diabetes Mother    CAD Mother    Heart attack Mother    Hypertension Mother    Cancer Father    Heart disease Father    Colon cancer Father    CAD Father    Cancer Sister    Breast cancer Sister 76   Diabetes Sister    Hypertension Sister     Heart disease Brother    Diabetes Brother    CAD Brother    Liver disease Niece 14   Esophageal cancer Neg Hx     Social History   Socioeconomic History   Marital status: Single    Spouse name: Not on file   Number of children: 2   Years of education: Not on file   Highest education level: 11th grade  Occupational History   Occupation: Unemployed  Tobacco Use   Smoking status: Some Days    Current packs/day: 0.10    Types: Cigarettes   Smokeless  tobacco: Never   Tobacco comments:    smokes about 1-2 cigs/day.  Has patches   Vaping Use   Vaping status: Never Used  Substance and Sexual Activity   Alcohol  use: Yes    Comment: beer   Drug use: Yes    Types: Marijuana, Crack cocaine   Sexual activity: Yes    Partners: Male    Birth control/protection: Post-menopausal  Other Topics Concern   Not on file  Social History Narrative   ** Merged History Encounter **       ** Data from: 12/05/12 Enc Dept: [facility]   No cocaine or alcohol  use since hospital discharge 05/19/2009.      10/13 update:  States she is smoking 1-2 cigarettes daily with at least 1 drink daily usually Brandy.                  ** Data from: 01/17/13 Enc Dept: [facility]   Pt lives in [place] with a roommate.      Social Drivers of Corporate investment banker Strain: Low Risk  (03/14/2023)   Overall Financial Resource Strain (CARDIA)    Difficulty of Paying Living Expenses: Not hard at all  Food Insecurity: No Food Insecurity (03/14/2023)   Hunger Vital Sign    Worried About Running Out of Food in the Last Year: Never true    Ran Out of Food in the Last Year: Never true  Transportation Needs: Unmet Transportation Needs (03/14/2023)   PRAPARE - Transportation    Lack of Transportation (Medical): Yes    Lack of Transportation (Non-Medical): Yes  Physical Activity: Sufficiently Active (03/14/2023)   Exercise Vital Sign    Days of Exercise per Week: 7 days    Minutes of Exercise per  Session: 30 min  Stress: No Stress Concern Present (03/14/2023)   [employer] of Occupational Health - Occupational Stress Questionnaire    Feeling of Stress : Not at all  Social Connections: Moderately Isolated (03/14/2023)   Social Connection and Isolation Panel    Frequency of Communication with Friends and Family: More than three times a week    Frequency of Social Gatherings with Friends and Family: Once a week    Attends Religious Services: More than 4 times per year    Active Member of [employer] or Organizations: No    Attends Banker Meetings: Never    Marital Status: Never married  Intimate Partner Violence: Not At Risk (03/14/2023)   Humiliation, Afraid, Rape, and Kick questionnaire    Fear of Current or Ex-Partner: No    Emotionally Abused: No    Physically Abused: No    Sexually Abused: No    Review of Systems:    Constitutional: No weight loss, fever, chills, weakness or fatigue HEENT: Eyes: No change in vision               Ears, Nose, Throat:  No change in hearing or congestion Skin: No rash or itching Cardiovascular: No chest pain, chest pressure or palpitations   Respiratory: No SOB or cough Gastrointestinal: See HPI and otherwise negative Genitourinary: No dysuria or change in urinary frequency Neurological: No headache, dizziness or syncope Musculoskeletal: No new muscle or joint pain Hematologic: No bleeding or bruising Psychiatric: No history of depression or anxiety    Physical Exam:  Vital signs: BP 122/68   Pulse 63   Ht 5' 3 (1.6 m)   Wt 139 lb (63 kg)  LMP 11/02/2010   BMI 24.62 kg/m   Constitutional: NAD, alert and cooperative Head:  Normocephalic and atraumatic. Eyes:   PEERL, EOMI. No icterus. Conjunctiva pink. Respiratory: Respirations even and unlabored. Lungs clear to auscultation bilaterally.   No wheezes, crackles, or rhonchi.  Cardiovascular:  Regular rate and rhythm. No peripheral edema, cyanosis or pallor.   Gastrointestinal:  Soft, nondistended, nontender. No rebound or guarding. Normal bowel sounds. No appreciable masses or hepatomegaly. Rectal:  Declines Msk:  Symmetrical without gross deformities. Without edema, no deformity or joint abnormality.  Neurologic:  Alert and  oriented x4;  grossly normal neurologically.  Skin:   Dry and intact without significant lesions or rashes. Psychiatric: Oriented to person, place and time. Demonstrates good judgement and reason without abnormal affect or behaviors.  RELEVANT LABS AND IMAGING: CBC    Component Value Date/Time   WBC 9.7 06/05/2023 1037   WBC 7.6 03/07/2023 0048   RBC 4.20 06/05/2023 1037   RBC 3.88 03/07/2023 0048   HGB 11.9 06/05/2023 1037   HCT 36.5 06/05/2023 1037   PLT 288 06/05/2023 1037   MCV 87 06/05/2023 1037   MCH 28.3 06/05/2023 1037   MCH 28.4 03/07/2023 0048   MCHC 32.6 06/05/2023 1037   MCHC 32.1 03/07/2023 0048   RDW 12.8 06/05/2023 1037   LYMPHSABS 2.5 03/07/2023 0048   LYMPHSABS 2.7 05/05/2020 1651   MONOABS 0.9 03/07/2023 0048   EOSABS 0.4 03/07/2023 0048   EOSABS 0.1 05/05/2020 1651   BASOSABS 0.0 03/07/2023 0048   BASOSABS 0.0 05/05/2020 1651    CMP     Component Value Date/Time   NA 144 02/25/2024 1545   K 4.5 02/25/2024 1545   CL 108 (H) 02/25/2024 1545   CO2 21 02/25/2024 1545   GLUCOSE 76 02/25/2024 1545   GLUCOSE 105 (H) 03/07/2023 0048   BUN 21 02/25/2024 1545   CREATININE 0.93 02/25/2024 1545   CREATININE 0.74 08/24/2014 1404   CALCIUM  8.8 02/25/2024 1545   PROT 6.9 02/25/2024 1545   ALBUMIN  4.3 02/25/2024 1545   AST 13 02/25/2024 1545   ALT 12 02/25/2024 1545   ALKPHOS 99 02/25/2024 1545   BILITOT 0.2 02/25/2024 1545   GFRNONAA >60 03/07/2023 0048   GFRNONAA >89 08/24/2014 1404   GFRAA 115 05/05/2020 1651   GFRAA >89 08/24/2014 1404     Assessment/Plan:   [name] is a 63 year old female with a PMH of 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  GERD Dysphagia Intermittent dysphagia (solids, liquids, and pills) and nocturnal acid reflux with burning chest sensation. Current omeprazole  20 mg once daily is insufficient. - Increase omeprazole  to 40 mg daily. - Advise lifestyle modifications: avoid lying down soon after eating, avoid acidic and spicy foods. - Consider barium swallow study if symptoms persist after medication adjustment. Patient declines barium swallow at this time. - follow up 12 weeks  Chronic constipation Melena Constipation with occasional questionable dark stools, bloating, and abdominal discomfort. No recent use of Pepto or iron supplements. Previous hemoglobin check was borderline. - Increase Miralax intake to once or twice daily as needed. - CBC, CMP, iron studies - hemoccult cards x 3 - patient higher risk from endoscopic evaluation, if significant anemia or blood present in stool will consider EGD/Colonoscopy - miralax 1 capful daily and adjust dose based on response.  Heart failure  most recent EF documented  at 55-60% with moderate MVR and dilated RV.   Nonischemic CM s/p VF arrest 2014 with improved EF and last 2D echo showing EF of 55 to 60%   History of right MCA stroke On ASA  Polysubstance abuse Positive for cocaine Dec 2024. History of seizure with alcohol  withdrawal - avoid alcohol , cocaine, and tobacco.  Assigned to Dr. Cherryl Corona today (Monday)  Tangie Stay [facility] 04/14/2024, 10:50 AM  Cc: Bevelyn Bryant, MD

## 2024-04-14 NOTE — Patient Instructions (Addendum)
 _______________________________________________________  If your blood pressure at your visit was 140/90 or greater, please contact your primary care physician to follow up on this.  _______________________________________________________  If you are age 63 or older, your body mass index should be between 23-30. Your Body mass index is 24.62 kg/m. If this is out of the aforementioned range listed, please consider follow up with your Primary Care Provider.  If you are age 57 or younger, your body mass index should be between 19-25. Your Body mass index is 24.62 kg/m. If this is out of the aformentioned range listed, please consider follow up with your Primary Care Provider.   ________________________________________________________  The [facility] providers would like to encourage you to use MYCHART to communicate with providers for non-urgent requests or questions.  Due to long hold times on the telephone, sending your provider a message by [facility] may be a faster and more efficient way to get a response.  Please allow 48 business hours for a response.  Please remember that this is for non-urgent requests.  _______________________________________________________  Your provider has requested that you go to the basement level for lab work before leaving today. Press B on the elevator. The lab is located at the first door on the left as you exit the elevator.  Follow the instructions on the Hemoccult cards and mail them back to us  when you are finished or you may take them directly to the lab in the basement of the [facility] building. We will call you with the results.   We have sent the following medications to your pharmacy for you to pick up at your convenience: Omeprazole  40mg  daily  It was a pleasure to see you today!  Thank you for trusting me with your gastrointestinal care!

## 2024-04-19 ENCOUNTER — Ambulatory Visit
Admission: RE | Admit: 2024-04-19 | Discharge: 2024-04-19 | Disposition: A | Discharge status: Home / Self Care | Payer: MEDICAID | Source: Ambulatory Visit | Attending: Neurology | Admitting: Neurology

## 2024-04-19 DIAGNOSIS — R404 Transient alteration of awareness: Secondary | ICD-10-CM

## 2024-04-19 DIAGNOSIS — I639 Cerebral infarction, unspecified: Secondary | ICD-10-CM

## 2024-04-21 ENCOUNTER — Telehealth: Payer: Self-pay | Admitting: Neurology

## 2024-04-21 NOTE — Progress Notes (Signed)
 Agree with the assessment and plan as outlined by Boone Master, PA-C.

## 2024-04-21 NOTE — Telephone Encounter (Signed)
  Please call patient, EEG from April 07, 2024 was normal  MRI of the brain showed remote infarction involving right side of the brain, there was no acute abnormality Check to see if she continue to have recurrent passing out episode,   IMPRESSION: This MRI of the brain without contrast shows the following: Remote right hemispheric infarction involving the posterior frontal lobe and parietal lobe with some extension to the temporal lobe and remote infarction in the left occipital lobe.  Both of these were present on the MRI from 2018 and appear unchanged. Few T2/FLAIR hyperintense foci consistent with minimal chronic microvascular ischemic changes in the hemispheres and left cerebellum no acute findings.

## 2024-04-22 ENCOUNTER — Telehealth: Payer: Self-pay | Admitting: *Deleted

## 2024-04-22 NOTE — Telephone Encounter (Signed)
 Appt for mammogram 04/29/2024 breast centet

## 2024-04-23 ENCOUNTER — Ambulatory Visit: Payer: MEDICAID

## 2024-04-23 NOTE — Procedures (Signed)
   HISTORY: 63 years old female history of stroke, presented with passing out episode  TECHNIQUE:  This is a routine 16 channel EEG recording with one channel devoted to a limited EKG recording.  It was performed during wakefulness, drowsiness and asleep.  Hyperventilation and photic stimulation were performed as activating procedures.  There are minimum muscle and movement artifact noted.  Upon maximum arousal, posterior dominant waking rhythm consistent of rhythmic alpha range activity. Activities are symmetric over the bilateral posterior derivations and attenuated with eye opening.  Photic stimulation did not alter the tracing.  Hyperventilation produced mild/moderate buildup with higher amplitude and the slower activities noted.  During EEG recording, patient developed drowsiness and entered sleep, sleep EEG demonstrated architecture, there were frontal centrally dominant vertex waves and symmetric sleep spindles noted.  During EEG recording, there was no epileptiform discharge noted.  EKG demonstrate normal sinus rhythm.  CONCLUSION: This is a  normal awake and asleep EEG.  There is no electrodiagnostic evidence of epileptiform discharge.  Sutton Plake, M.D. Ph.D.  [facility] [address] [place], [place] [address] Phone: [phone] Fax:      [phone]

## 2024-04-29 ENCOUNTER — Ambulatory Visit
Admission: RE | Admit: 2024-04-29 | Discharge: 2024-04-29 | Disposition: A | Discharge status: Home / Self Care | Payer: MEDICAID | Source: Ambulatory Visit | Attending: Internal Medicine

## 2024-04-29 DIAGNOSIS — Z1231 Encounter for screening mammogram for malignant neoplasm of breast: Secondary | ICD-10-CM

## 2024-04-29 NOTE — Telephone Encounter (Signed)
 Called and spoke to pt and relayed results. Pt voiced gratitude and understanding and stated that her most recent fainting event was two days ago. Pt stated that her right side has been in a tremendous amount of pain since 2 weeks ago.

## 2024-05-16 ENCOUNTER — Ambulatory Visit (HOSPITAL_COMMUNITY): Payer: MEDICAID | Admitting: Psychiatry

## 2024-05-19 ENCOUNTER — Ambulatory Visit (INDEPENDENT_AMBULATORY_CARE_PROVIDER_SITE_OTHER): Payer: MEDICAID | Admitting: Clinical

## 2024-05-19 DIAGNOSIS — F331 Major depressive disorder, recurrent, moderate: Secondary | ICD-10-CM | POA: Diagnosis not present

## 2024-05-19 DIAGNOSIS — F1920 Other psychoactive substance dependence, uncomplicated: Secondary | ICD-10-CM

## 2024-05-19 NOTE — Progress Notes (Unsigned)
 Comprehensive Clinical Assessment (CCA) Note  05/19/2024 [name] [id number]  Chief Complaint:  Chief Complaint  Patient presents with   Depression   Addiction Problem   Visit Diagnosis:  Major depressive disorder, recurrent, moderate Polysubstance use   Interpretive summary:  Client is a 63 year old female presenting to the [facility] to reestablish outpatient services.  Client was previously seen at the [facility] outpatient office in 2024 for symptoms related to PTSD, anxiety, depression, and polysubstance use. Client reported in recent months she has had issues with homelessness that are contributing to her depressive symptoms at this time. Client reported that she previously within the past year worked with [employer] and the [facility] for case management assistance to apply for and/or locate housing.  Client reported all people that she was told to get in contact with have not followed up with her. Client reported she is having issues with sleeping. Client reported enlisting help from family members has not gone well.  Client reported she was staying with her daughter but she moved out because she is not able to give her money.  Client reported she has been staying with a friend who is a lot of her approximately 2 months to stay with her.  Client reported coping with the stress she has continued to use alcohol  and crack/cocaine. Client reported she has used both within the past week.  Client reported she has had no other hospitalizations for mental health or substance use reasons within the past year. Client presented oriented times five, appropriately dressed and friendly. Client denied hallucinations, delusions, suicidal and homicidal ideations.  Client was screened for pain, nutrition, [place] suicide severity and the following S DOH:    05/26/2024    9:53 AM 02/08/2024   10:10 AM 10/10/2023    2:54 PM 05/31/2023   11:12 AM  GAD 7 : Generalized Anxiety Score   Nervous, Anxious, on Edge 2 3 3 2   Control/stop worrying  3 3 2   Worry too much - different things 2 2 3    Trouble relaxing 1 3 2    Restless 3 3 3 1   Easily annoyed or irritable  3 3   Afraid - awful might happen 3  3 1   Total GAD 7 Score   20   Anxiety Difficulty  Extremely difficult     Treatment recommendations: Client will be referred to a [facility] dually licensed counselor to address both mental health and substance use concerns.    CCA Biopsychosocial Intake/Chief Complaint:  Client has a history of depression, anxiety, PTSD, and polysubstance use  Current Symptoms/Problems: depression symtpoms, anxiety symptoms, substance use  Patient Reported Schizophrenia/Schizoaffective Diagnosis in Past: No  Strengths: cooking  Preferences: group that interacts  Abilities: cooking,  playing games on phone  Type of Services Patient Feels are Needed: Counseling and psychiatry  Initial Clinical Notes/Concerns: No data recorded  Mental Health Symptoms Depression:  Change in energy/activity; Difficulty Concentrating; Fatigue; Increase/decrease in appetite; Irritability; Sleep (too much or little); Tearfulness; Weight gain/loss   Duration of Depressive symptoms: Greater than two weeks   Mania:  None   Anxiety:   Worrying; Difficulty concentrating; Fatigue; Irritability; Restlessness; Sleep; Tension   Psychosis:  None   Duration of Psychotic symptoms: No data recorded  Trauma:  None   Obsessions:  None   Compulsions:  None   Inattention:  None   Hyperactivity/Impulsivity:  None   Oppositional/Defiant Behaviors:  None   Emotional Irregularity:  None   Other Mood/Personality  Symptoms:  No data recorded   Mental Status Exam Appearance and self-care  Stature:  Small   Weight:  Average weight   Clothing:  Casual   Grooming:  Normal   Cosmetic use:  Age appropriate   Posture/gait:  Normal   Motor activity:  Not Remarkable   Sensorium  Attention:  Normal    Concentration:  Normal   Orientation:  X5   Recall/memory:  Normal   Affect and Mood  Affect:  Appropriate   Mood:  Depressed   Relating  Eye contact:  Normal   Facial expression:  Responsive   Attitude toward examiner:  Cooperative   Thought and Language  Speech flow: Clear and Coherent   Thought content:  Appropriate to Mood and Circumstances   Preoccupation:  None   Hallucinations:  None   Organization:  No data recorded  [employer] of Knowledge:  Good   Intelligence:  Average   Abstraction:  Functional   Judgement:  Fair   Reality Testing:  Adequate   Insight:  Fair   Decision Making:  Normal   Social Functioning  Social Maturity:  Isolates   Social Judgement:  Normal   Stress  Stressors:  Housing   Coping Ability:  Deficient supports; Overwhelmed   Skill Deficits:  Decision making; Interpersonal; Self-control   Supports:  No data recorded    Religion: Religion/Spirituality Are You A Religious Person?: Yes  Leisure/Recreation: Leisure / Recreation Do You Have Hobbies?: Yes  Exercise/Diet: Exercise/Diet Do You Exercise?: No Have You Gained or Lost A Significant Amount of Weight in the Past Six Months?: No Do You Follow a Special Diet?: No Do You Have Any Trouble Sleeping?: Yes Explanation of Sleeping Difficulties: difficulty initiating and sustaining   CCA Employment/Education Employment/Work Situation: Employment / Work Situation Employment Situation: On disability Why is Patient on Disability: client reported she recieves SSI.  Education: Education Last Grade Completed: 11 Name of High School: [employer] Did Garment/textile technologist From [employer]?: No Did Theme park manager?: No Did You Have An Individualized Education Program (IIEP): No Did You Have Any Difficulty At School?: No (client reported she was pregnant and unable to finish high school at the time.) Patient's Education Has Been Impacted by Current  Illness: No   CCA Family/Childhood History Family and Relationship History: Family history Marital status: Single Does patient have children?: Yes How many children?: 2 How is patient's relationship with their children?: client reported she has a daughter and a son,they are grown.  Childhood History:  Childhood History Additional childhood history information: client reported she is from [place] . client reported her childhood was good. client reported her parents were alcoholics and addicts. client reported her parents are now deceased. Does patient have siblings?: Yes Number of Siblings: 2 Description of patient's current relationship with siblings: client reported she has 4 sisters and 2 brothers. client reported she does not have a close relationship with them. client reported she helped them when needed but now she needs help they won't help her. Did patient suffer any verbal/emotional/physical/sexual abuse as a child?: No Did patient suffer from severe childhood neglect?: No Has patient ever been sexually abused/assaulted/raped as an adolescent or adult?: No Was the patient ever a victim of a crime or a disaster?: No Witnessed domestic violence?: No Has patient been affected by domestic violence as an adult?: No  Child/Adolescent Assessment:     CCA Substance Use Alcohol /Drug Use:  ASAM's:  Six Dimensions of Multidimensional Assessment  Dimension 1:  Acute Intoxication and/or Withdrawal Potential:      Dimension 2:  Biomedical Conditions and Complications:      Dimension 3:  Emotional, Behavioral, or Cognitive Conditions and Complications:     Dimension 4:  Readiness to Change:     Dimension 5:  Relapse, Continued use, or Continued Problem Potential:     Dimension 6:  Recovery/Living Environment:     ASAM Severity Score:    ASAM Recommended Level of Treatment:     Substance use Disorder (SUD)    Recommendations for  Services/Supports/Treatments:    DSM5 Diagnoses: Patient Active Problem List   Diagnosis Date Noted   Cerebrovascular accident (CVA) (HCC) 03/27/2024   Alteration consciousness 03/27/2024   Syncope 02/25/2024   Dysphagia 01/02/2024   Nonrheumatic mitral valve regurgitation 10/15/2023   Palpitations 10/15/2023   Neuropathy 08/08/2023   Leg pain, bilateral 05/31/2023   Polyuria 05/31/2023   Alcohol  use 03/14/2023   Weakness of both hands 11/15/2022   History of alcohol  abuse 03/20/2022   Uninsured 03/20/2022   Blurry vision 03/20/2022   Heart failure, type unknown (HCC) 03/20/2022   History of CVA with residual deficit 03/20/2022   Cervical radiculopathy 03/20/2022   SOB (shortness of breath) 05/28/2020   Right foot pain 05/05/2020   Normocytic anemia 11/23/2017   Cough 11/23/2017   PAD (peripheral artery disease) (HCC) 10/18/2016   Unintentional weight loss 07/26/2016   Vitamin D  deficiency 09/23/2013   GERD (gastroesophageal reflux disease) 02/28/2013   Nonischemic cardiomyopathy, LBBB (likely d/t ETOH abuse)  01/19/2013   Chest pain 12/05/2012   Tobacco use 04/08/2012   Difficulty sleeping 05/05/2011   Health care maintenance 01/02/2011   Depression 05/09/2007   Polysubstance abuse (with elevated transaminases)  05/09/2007    Patient Centered Plan: Patient is on the following Treatment Plan(s):  Depression   Referrals to Alternative Service(s): Referred to Alternative Service(s):   Place:   Date:   Time:    Referred to Alternative Service(s):   Place:   Date:   Time:    Referred to Alternative Service(s):   Place:   Date:   Time:    Referred to Alternative Service(s):   Place:   Date:   Time:      Collaboration of Care: Referral or follow-up with counselor/therapist [facility]  Patient/Guardian was advised Release of Information must be obtained prior to any record release in order to collaborate their care with an outside provider. Patient/Guardian was advised if  they have not already done so to contact the registration department to sign all necessary forms in order for us  to release information regarding their care.   Consent: Patient/Guardian gives verbal consent for treatment and assignment of benefits for services provided during this visit. Patient/Guardian expressed understanding and agreed to proceed.   Adriella Essex Y Julya Alioto, LCSW

## 2024-05-26 ENCOUNTER — Ambulatory Visit: Payer: MEDICAID | Admitting: Student

## 2024-05-26 ENCOUNTER — Telehealth: Payer: Self-pay

## 2024-05-26 VITALS — BP 135/65 | HR 58 | Temp 98.4°F | Ht 63.0 in | Wt 136.2 lb

## 2024-05-26 DIAGNOSIS — J984 Other disorders of lung: Secondary | ICD-10-CM | POA: Insufficient documentation

## 2024-05-26 DIAGNOSIS — I5032 Chronic diastolic (congestive) heart failure: Secondary | ICD-10-CM | POA: Diagnosis not present

## 2024-05-26 DIAGNOSIS — Z720 Tobacco use: Secondary | ICD-10-CM

## 2024-05-26 DIAGNOSIS — F1910 Other psychoactive substance abuse, uncomplicated: Secondary | ICD-10-CM

## 2024-05-26 DIAGNOSIS — Z23 Encounter for immunization: Secondary | ICD-10-CM | POA: Diagnosis not present

## 2024-05-26 DIAGNOSIS — M5412 Radiculopathy, cervical region: Secondary | ICD-10-CM | POA: Diagnosis not present

## 2024-05-26 DIAGNOSIS — Z0000 Encounter for general adult medical examination without abnormal findings: Secondary | ICD-10-CM

## 2024-05-26 DIAGNOSIS — G479 Sleep disorder, unspecified: Secondary | ICD-10-CM

## 2024-05-26 DIAGNOSIS — R55 Syncope and collapse: Secondary | ICD-10-CM

## 2024-05-26 DIAGNOSIS — I6930 Unspecified sequelae of cerebral infarction: Secondary | ICD-10-CM

## 2024-05-26 DIAGNOSIS — F17210 Nicotine dependence, cigarettes, uncomplicated: Secondary | ICD-10-CM

## 2024-05-26 DIAGNOSIS — G629 Polyneuropathy, unspecified: Secondary | ICD-10-CM

## 2024-05-26 DIAGNOSIS — I739 Peripheral vascular disease, unspecified: Secondary | ICD-10-CM

## 2024-05-26 DIAGNOSIS — E785 Hyperlipidemia, unspecified: Secondary | ICD-10-CM

## 2024-05-26 DIAGNOSIS — R053 Chronic cough: Secondary | ICD-10-CM

## 2024-05-26 DIAGNOSIS — I428 Other cardiomyopathies: Secondary | ICD-10-CM

## 2024-05-26 MED ORDER — ASPIRIN 81 MG PO TBEC: 81.0000 mg | DELAYED_RELEASE_TABLET | Freq: Every day | ORAL | 3 refills | Status: AC

## 2024-05-26 MED ORDER — ALBUTEROL SULFATE HFA 108 (90 BASE) MCG/ACT IN AERS
2.0000 | INHALATION_SPRAY | RESPIRATORY_TRACT | 0 refills | Status: DC | PRN
Start: 2024-05-26 — End: 2024-07-30

## 2024-05-26 MED ORDER — GABAPENTIN 300 MG PO CAPS: 300.0000 mg | ORAL_CAPSULE | Freq: Three times a day (TID) | ORAL | 3 refills | Status: AC

## 2024-05-26 MED ORDER — TRELEGY ELLIPTA 100-62.5-25 MCG/ACT IN AEPB: 1.0000 | INHALATION_SPRAY | Freq: Every day | RESPIRATORY_TRACT | 3 refills | Status: DC

## 2024-05-26 MED ORDER — MELATONIN 1 MG PO CAPS: 0.5000 mg | ORAL_CAPSULE | Freq: Every evening | ORAL | 3 refills | Status: DC | PRN

## 2024-05-26 MED ORDER — CARVEDILOL 6.25 MG PO TABS: 6.2500 mg | ORAL_TABLET | Freq: Two times a day (BID) | ORAL | 3 refills | Status: DC

## 2024-05-26 MED ORDER — NICOTINE 7 MG/24HR TD PT24
7.0000 mg | MEDICATED_PATCH | TRANSDERMAL | 3 refills | Status: DC
Start: 2024-05-26 — End: 2024-09-23

## 2024-05-26 MED ORDER — CARVEDILOL 3.125 MG PO TABS: 3.1250 mg | ORAL_TABLET | Freq: Two times a day (BID) | ORAL | 3 refills | Status: AC

## 2024-05-26 NOTE — Assessment & Plan Note (Signed)
 Smokes approximately 4 cigarettes/week.  Is actively reducing this.  Sent nicotine  patches to further help with this.

## 2024-05-26 NOTE — Assessment & Plan Note (Signed)
 Remote R MCA infarct.  Continue Lipitor and ASA.  Check lipid panel

## 2024-05-26 NOTE — Assessment & Plan Note (Signed)
 Continue gabapentin.

## 2024-05-26 NOTE — Assessment & Plan Note (Addendum)
 Continues to use crack-cocaine.  Previous housing insecurity caused this to be problematic for her.  Is now living with a friend and is confident she will stop using.  I extensively counseled on how this is affecting her acutely and overall chronic conditions.

## 2024-05-26 NOTE — Assessment & Plan Note (Signed)
 Mildly bradycardic at 58 today.  Had taken her Coreg  this morning.  She has had some episodes of syncope over the years most recently in April without specific etiology determined.  Will reduce Coreg  to 3.125 twice daily with instructions to obtain BP cuff and record HR/BP to bring in next visit.

## 2024-05-26 NOTE — Progress Notes (Signed)
 Internal Medicine Clinic Attending  Case discussed with the resident at the time of the visit.  We reviewed the resident's history and exam and pertinent patient test results.  I agree with the assessment, diagnosis, and plan of care documented in the resident's note.

## 2024-05-26 NOTE — Assessment & Plan Note (Signed)
 Prevnar given today.  She will be submitting FIT test as well.

## 2024-05-26 NOTE — Assessment & Plan Note (Signed)
 Melatonin sent.  Sleep hygiene advice given.

## 2024-05-26 NOTE — Telephone Encounter (Signed)
 [name] (KeyBETHA GULLING) PA Case ID #: [id number] Rx #: [id number] Need Help? Call us  at [phone] Outcome Denied today by [employer] 2017 Denied Drug Trelegy Ellipta  100-62.5-25MCG/ACT aerosol powder ePA cloud logo Form [employer] Electronic Prior Authorization Form Original Claim Info 75

## 2024-05-26 NOTE — Telephone Encounter (Signed)
 Prior Authorization for patient (Trelegy Ellipta  100-62.5-25MCG/ACT aerosol powder) came through on cover my meds was submitted with last office notes awaiting approval or denial.  XZB:[id number]

## 2024-05-26 NOTE — Assessment & Plan Note (Signed)
 Lipid panel sent as she was above goal for secondary prevention 05/2023.

## 2024-05-26 NOTE — Progress Notes (Signed)
 CC: Follow-up  HPI:  [name] is a 63 y.o. female living with a history stated below and presents today for follow-up. Please see problem based assessment and plan for additional details.  Past Medical History:  Diagnosis Date   Alcoholism (HCC)    ANEMIA, VITAMIN B12 DEFICIENCY 11/11/2007   Documented low 175 on 09/2007; received 6 yrs of IM therapy; switched to po 05/2013    Anxiety    Breast pain, left 06/02/2021   Cardiac arrest (HCC) 01/16/2013   witnessed, shockable rhythm, initally low EF, no CAD at cath. Drug screen negative.   Chest pain    a. 04/2009 Echo: EF 55-60%, Gr1 DD, Mild MR.   Depression    Duodenitis    secondary to H pylori(treatment completed 05/2006), +/- NSAIDS   Duodenitis    a. 05/2006 2/2 H pylori +/- NSAIDS   DVT (deep venous thrombosis) (HCC)    a. 04/2009 - treated with coumadin x 8 mos.   ETOH abuse    GERD (gastroesophageal reflux disease)    Heart murmur    History of cocaine abuse (HCC)    History of CVA (cerebrovascular accident)    Hypertension    Left arm pain 06/11/2017   PE 05/16/2009   CT angio positive for PE in 07/10, treated with coumadin for 8 months.    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 04/2009 - treated with coumadin x 8 mos.   Rectal bleeding 2007   internal hemmorhoids by colonoscopy in 04/2006, Bx neg for IBD   Rectal bleeding    a. int hemorrhoids by colonoscopy in 04/2006, Bx neg for IBD   Right foot pain 05/05/2020   Seizures (HCC)    Shortness of breath 05/28/2020   Stroke [facility])    Stroke [facility])    Tobacco abuse    Trochanteric bursitis of left hip 05/22/2016    Current Outpatient Medications on File Prior to Visit  Medication Sig Dispense Refill   Acetaminophen  Extra Strength 500 MG CAPS Take 1 capsule by mouth 3 (three) times daily.     atorvastatin  (LIPITOR) 40 MG tablet Take 1 tablet (40 mg total) by mouth daily. 90 tablet 3   baclofen  (LIORESAL ) 10 MG tablet Take 1 tablet (10 mg  total) by mouth 3 (three) times daily. (Patient not taking: Reported on 04/14/2024) 30 each 0   Cholecalciferol  25 MCG (1000 UT) CHEW Chew 1 tablet (1,000 Units total) by mouth daily. (Patient not taking: Reported on 04/14/2024) 90 tablet 0   diclofenac  Sodium (VOLTAREN ) 1 % GEL Apply 2 g topically 4 (four) times daily. (Patient not taking: Reported on 04/14/2024) 100 g 0   furosemide  (LASIX ) 20 MG tablet Take 1 tablet (20 mg total) by mouth daily as needed. 30 tablet 1   nitroGLYCERIN  (NITROSTAT ) 0.4 MG SL tablet Place 1 tablet (0.4 mg total) under the tongue every 5 (five) minutes as needed for chest pain. 25 tablet 1   omeprazole  (PRILOSEC ) 40 MG capsule Take 1 capsule (40 mg total) by mouth daily. 30 capsule 3   No current facility-administered medications on file prior to visit.    Family History  Problem Relation Age of Onset   Cancer Mother    Breast cancer Mother 52   Heart disease Mother    Diabetes Mother    CAD Mother    Heart attack Mother    Hypertension Mother    Cancer Father    Heart disease  Father    Colon cancer Father    CAD Father    Cancer Sister    Breast cancer Sister 13   Diabetes Sister    Hypertension Sister    Heart disease Brother    Diabetes Brother    CAD Brother    Liver disease Niece 4   Esophageal cancer Neg Hx     Social History   Socioeconomic History   Marital status: Single    Spouse name: Not on file   Number of children: 2   Years of education: Not on file   Highest education level: 11th grade  Occupational History   Occupation: Unemployed  Tobacco Use   Smoking status: Some Days    Current packs/day: 0.10    Types: Cigarettes   Smokeless tobacco: Never   Tobacco comments:    smokes about 1-2 cigs/day.  Has patches   Vaping Use   Vaping status: Never Used  Substance and Sexual Activity   Alcohol  use: Yes    Comment: beer   Drug use: Yes    Types: Marijuana, Crack cocaine   Sexual activity: Yes    Partners: Male     Birth control/protection: Post-menopausal  Other Topics Concern   Not on file  Social History Narrative   ** Merged History Encounter **       ** Data from: 12/05/12 Enc Dept: [facility]   No cocaine or alcohol  use since hospital discharge 05/19/2009.      10/13 update:  States she is smoking 1-2 cigarettes daily with at least 1 drink daily usually Brandy.                  ** Data from: 01/17/13 Enc Dept: [facility]   Pt lives in [place] with a roommate.      Social Drivers of Corporate investment banker Strain: Low Risk  (03/14/2023)   Overall Financial Resource Strain (CARDIA)    Difficulty of Paying Living Expenses: Not hard at all  Food Insecurity: No Food Insecurity (03/14/2023)   Hunger Vital Sign    Worried About Running Out of Food in the Last Year: Never true    Ran Out of Food in the Last Year: Never true  Transportation Needs: Unmet Transportation Needs (03/14/2023)   PRAPARE - Transportation    Lack of Transportation (Medical): Yes    Lack of Transportation (Non-Medical): Yes  Physical Activity: Sufficiently Active (03/14/2023)   Exercise Vital Sign    Days of Exercise per Week: 7 days    Minutes of Exercise per Session: 30 min  Stress: No Stress Concern Present (03/14/2023)   [employer] of Occupational Health - Occupational Stress Questionnaire    Feeling of Stress : Not at all  Social Connections: Moderately Isolated (03/14/2023)   Social Connection and Isolation Panel    Frequency of Communication with Friends and Family: More than three times a week    Frequency of Social Gatherings with Friends and Family: Once a week    Attends Religious Services: More than 4 times per year    Active Member of [employer] or Organizations: No    Attends Banker Meetings: Never    Marital Status: Never married  Intimate Partner Violence: Not At Risk (03/14/2023)   Humiliation, Afraid, Rape, and Kick questionnaire    Fear of Current or Ex-Partner: No     Emotionally Abused: No    Physically Abused: No    Sexually Abused: No  Review of Systems: ROS negative except for what is noted on the assessment and plan.  Vitals:   05/26/24 0838  BP: 135/65  Pulse: (!) 58  Temp: 98.4 F (36.9 C)  TempSrc: Oral  SpO2: 100%  Weight: 136 lb 3.2 oz (61.8 kg)  Height: 5' 3 (1.6 m)    Physical Exam: Constitutional: well-appearing, sitting in chair, in no acute distress Cardiovascular: bradycardic, no m/r/g Pulmonary/Chest: normal work of breathing on room air, lungs clear to auscultation bilaterally Abdominal: soft, non-tender, non-distended MSK: normal bulk and tone Skin: warm and dry Psych: normal mood and behavior  Assessment & Plan:     Patient discussed with Dr. Jeanelle  Heart failure with recovered ejection fraction (HFrecEF) (HCC) Mildly bradycardic at 58 today.  Had taken her Coreg  this morning.  She has had some episodes of syncope over the years most recently in April without specific etiology determined.  Will reduce Coreg  to 3.125 twice daily with instructions to obtain BP cuff and record HR/BP to bring in next visit.  Cervical radiculopathy Continue gabapentin   Syncope No episodes since being seen 01/2024.  Reduce Coreg  per above.  Tobacco use Smokes approximately 4 cigarettes/week.  Is actively reducing this.  Sent nicotine  patches to further help with this.  History of CVA with residual deficit Remote R MCA infarct.  Continue Lipitor and ASA.  Check lipid panel  Polysubstance abuse (with elevated transaminases)  Continues to use crack-cocaine.  Previous housing insecurity caused this to be problematic for her.  Is now living with a friend and is confident she will stop using.  I extensively counseled on how this is affecting her acutely and overall chronic conditions.  Trouble in sleeping Melatonin sent.  Sleep hygiene advice given.  Health care maintenance Prevnar given today.  She will be submitting FIT  test as well.   Hyperlipidemia Lipid panel sent as she was above goal for secondary prevention 05/2023.   Norman Lobstein, D.O. [facility] Internal Medicine, PGY-1 Phone: [phone] Date 05/26/2024 Time 10:42 AM

## 2024-05-26 NOTE — Assessment & Plan Note (Signed)
 No episodes since being seen 01/2024.  Reduce Coreg  per above.

## 2024-05-27 ENCOUNTER — Other Ambulatory Visit (HOSPITAL_COMMUNITY): Payer: Self-pay

## 2024-05-27 LAB — LIPID PANEL
Chol/HDL Ratio: 2.6 ratio (ref 0.0–4.4)
Cholesterol, Total: 152 mg/dL (ref 100–199)
HDL: 58 mg/dL (ref >39)
LDL Chol Calc (NIH): 77 mg/dL (ref 0–99)
Triglycerides: 90 mg/dL (ref 0–149)
VLDL Cholesterol Cal: 17 mg/dL (ref 5–40)

## 2024-05-28 ENCOUNTER — Telehealth: Payer: Self-pay | Admitting: *Deleted

## 2024-05-28 ENCOUNTER — Other Ambulatory Visit: Payer: Self-pay | Admitting: Student

## 2024-05-28 ENCOUNTER — Ambulatory Visit: Payer: Self-pay | Admitting: Student

## 2024-05-28 DIAGNOSIS — I739 Peripheral vascular disease, unspecified: Secondary | ICD-10-CM

## 2024-05-28 MED ORDER — ATORVASTATIN CALCIUM 80 MG PO TABS: 80.0000 mg | ORAL_TABLET | Freq: Every day | ORAL | 3 refills | Status: AC

## 2024-05-28 NOTE — Telephone Encounter (Signed)
 Copied from CRM [id number]. Topic: Clinical - Lab/Test Results >> May 28, 2024  2:41 PM [name] wrote: Reason for CRM: Patient called stating that the doctor called to give her some results and also to tell her about a medication that he prescribed. Patient states she wasn't able to take the call at the time. Please have nurse or provider to contact patient to advise. CB #: [id number].

## 2024-06-03 ENCOUNTER — Other Ambulatory Visit: Payer: Self-pay | Admitting: Student

## 2024-06-03 MED ORDER — BUDESONIDE-FORMOTEROL FUMARATE 160-4.5 MCG/ACT IN AERO: 2.0000 | INHALATION_SPRAY | Freq: Every day | RESPIRATORY_TRACT | 2 refills | Status: DC

## 2024-06-03 MED ORDER — UMECLIDINIUM BROMIDE 62.5 MCG/ACT IN AEPB: 1.0000 | INHALATION_SPRAY | Freq: Every day | RESPIRATORY_TRACT | 11 refills | Status: DC

## 2024-06-20 ENCOUNTER — Encounter (HOSPITAL_COMMUNITY): Payer: Self-pay | Admitting: Physician Assistant

## 2024-06-20 ENCOUNTER — Ambulatory Visit (HOSPITAL_COMMUNITY): Payer: MEDICAID | Admitting: Physician Assistant

## 2024-06-20 ENCOUNTER — Other Ambulatory Visit: Payer: Self-pay

## 2024-06-20 ENCOUNTER — Encounter (HOSPITAL_COMMUNITY): Payer: Self-pay

## 2024-06-20 ENCOUNTER — Ambulatory Visit (HOSPITAL_COMMUNITY): Payer: MEDICAID | Admitting: Psychiatry

## 2024-06-20 VITALS — BP 154/70 | HR 61 | Ht 63.0 in | Wt 130.6 lb

## 2024-06-20 DIAGNOSIS — F331 Major depressive disorder, recurrent, moderate: Secondary | ICD-10-CM

## 2024-06-20 DIAGNOSIS — F1020 Alcohol dependence, uncomplicated: Secondary | ICD-10-CM

## 2024-06-20 DIAGNOSIS — F1420 Cocaine dependence, uncomplicated: Secondary | ICD-10-CM | POA: Diagnosis not present

## 2024-06-20 MED ORDER — NALTREXONE HCL 50 MG PO TABS
25.0000 mg | ORAL_TABLET | Freq: Every day | ORAL | 1 refills | Status: DC
  Filled 2024-06-20: qty 15, 30d supply, fill #0

## 2024-06-20 MED ORDER — SERTRALINE HCL 50 MG PO TABS
50.0000 mg | ORAL_TABLET | Freq: Every day | ORAL | 1 refills | Status: DC
  Filled 2024-06-20: qty 30, 30d supply, fill #0

## 2024-06-20 MED ORDER — TRAZODONE HCL 100 MG PO TABS
100.0000 mg | ORAL_TABLET | Freq: Every day | ORAL | 1 refills | Status: DC
  Filled 2024-06-20: qty 30, 30d supply, fill #0
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
## 2024-06-24 ENCOUNTER — Other Ambulatory Visit: Payer: Self-pay

## 2024-07-03 ENCOUNTER — Encounter (HOSPITAL_COMMUNITY): Payer: Self-pay

## 2024-07-03 ENCOUNTER — Ambulatory Visit (HOSPITAL_COMMUNITY): Payer: MEDICAID

## 2024-07-03 DIAGNOSIS — F331 Major depressive disorder, recurrent, moderate: Secondary | ICD-10-CM

## 2024-07-03 DIAGNOSIS — F1420 Cocaine dependence, uncomplicated: Secondary | ICD-10-CM

## 2024-07-03 DIAGNOSIS — F1020 Alcohol dependence, uncomplicated: Secondary | ICD-10-CM | POA: Diagnosis not present

## 2024-07-03 NOTE — Progress Notes (Addendum)
 THERAPIST PROGRESS NOTE  Session Time: 9:00 am to 10:01  Type of Therapy: Individual   Therapist Response/Interventions: CBT/Psycho-education: discussed the effects of alcohol  and drugs on depression/assisted in helping [name] identify external triggers, discussed sharing one is in recovery, discussed playing it forward to remind oneself what happened previously when engaged in use.   Treatment Goals addressed: Problem: Substance Use  Dates: Start:  07/03/24    Disciplines: Interdisciplinary, PROVIDER  Goal: [name] will report complete abstinence from drugs and alcohol  per self report.  Dates: Start:  07/03/24   Expected End:  12/31/24    Disciplines: Interdisciplinary, PROVIDER  Goal: [name] will decrease her depression and anxiety by reporting symptoms on the PHQ-9 and GAD-7 scores of no higher than a 4.  Dates: Start:  07/03/24   Expected End:  12/31/24    Disciplines: Interdisciplinary, PROVIDER  Intervention: Therapist will educate [name] about SUD's, patterns and consequences of use, relapse risks, the treatment process and types of mutual groups, provide early recovery and relapse prevention skills.  Dates: Start:  07/03/24    Intervention: Therapist will assist [name] in identifying thoughts and behaviors that can contribute to feelings of depression and anxiety.    Summary: [name] presents today to begin outpt therapy. Amber Cozart, LCSW did her CCA and referred [name] to this dual diagnosis clinician.  [name] reports the information noted below on her history of substance use.  She says a drug dealer lives next door and she does not have the current resources to move. She says she tries to stay busy and get away from the apartment on a daily basis. She says she goes to her daughters house or stays with a female friend who does not use. [name] says a friend is letting her stay at the apartment until the end of the month. She says this friend uses but is not there much.  [name] says  when she comes home, the party is on and [name] finds herself sick the next day.   [name] says she needs to stop smoking because she found out last year that she has COPD. [name] says when she leaves from this appointment, she is taking the bus to her daughter's house to sleep and visit her grandchild.    Substance #1 Name of Substance 1: Alcohol  Use Disorder, Severe 1 - Age of First Use: 14 1 - Amount (size/oz): [name] reports in the 90's she would drink to the point of blacking out, Within the past year she has reduced her use to 2 (40) oz per day and 1/5 liquor per week 1 - Frequency: daily 1 - Duration: since age 7 1 - Last Use / Amount: yesterday, (1) 40 oz 1 - Method of Acquiring: legal 1- Route of Use: oral  Substance #2 Name of Substance 2:  Cocaine 2 - Age of First Use: in her 6's 2 - Amount (size/oz): daily. Amount: as much as I could get. Now uses weekly or every two weeks. She says she does $ 20.00 worth 2 - Frequency: started out as daily but reduced to weekly or bi weekly 2 - Duration: since in her 30's 2 - Last Use / Amount: 2 months ago 2 - Method of Acquiring: illicit 2- Route of Use: smoking   Progress Towards Goals: initial session  Suicidal/Homicidal: denies  Plan: Return again on 07-22-24 at 3pm  Diagnosis:   Collaboration of Care:   Patient/Guardian was advised Release of Information must be obtained prior to any record release in  order to collaborate their care with an outside provider. Patient/Guardian was advised if they have not already done so to contact the registration department to sign all necessary forms in order for us  to release information regarding their care.   Consent: Patient/Guardian gives verbal consent for treatment and assignment of benefits for services provided during this visit. Patient/Guardian expressed understanding and agreed to proceed.   Darice Simpler, MS.  LMFT, LCAS

## 2024-07-22 ENCOUNTER — Ambulatory Visit (HOSPITAL_COMMUNITY): Payer: MEDICAID

## 2024-07-23 NOTE — Progress Notes (Deleted)
 BH MD/PA/NP OP Progress Note  07/23/2024 10:15 AM ABIGAYL HOR  MRN:  [id number]  Visit Diagnosis: No diagnosis found.  Assessment:  Jamesina D. Fuquay is a 63 year old female with a past psychiatric history significant for alcohol  use disorder (severe, dependence), cocaine use disorder (severe, dependence) and bipolar disorder who presents to [facility] to establish psychiatric care and for medication management.   Patient presents to the encounter stating that she has a past psychiatric history significant for bipolar disorder.  She reports that she has been struggling with depression for 2 years and attributes her depression to homelessness.  Patient endorses a past history of manic episodes such as elevated mood and grandiosity but states that she only felt this way under the influence of alcohol  and illicit substances.  Patient also notes that she has experienced paranoia and auditory hallucinations while under the influence of illicit substances and alcohol .  Patient endorses minimal anxiety at this time.  A PHQ-9 screen was performed with the patient scoring a 21.  A GAD-7 screen was also performed with the patient scoring a 20.   Given patient's history of manic symptoms and psychosis while under the influence of alcohol  and illicit substances, it is more likely that patient has a diagnosis of depression (major depressive disorder).  Patient was recommended Zoloft  50 mg daily for the management of her depressive symptoms.  Patient was also recommended trazodone  100 mg at bedtime for the management of her sleep.  Patient was agreeable to recommendations.  Patient's medications to be e-prescribed to pharmacy of choic  Risk Assessment: An assessment of suicide and violence risk factors was performed as part of this evaluation and is not *** significantly changed from the last visit. While future psychiatric events cannot be accurately predicted, the  patient does not *** currently require acute inpatient psychiatric care and does not *** currently meet Miller  involuntary commitment criteria. Patient was given contact information for crisis resources, behavioral health clinic and was instructed to call 911 for emergencies.   Aasha D Piggee presents for follow-up evaluation. Today, 07/23/24, patient reports *** 1. Alcohol  use disorder, severe, dependence (HCC) (Primary)   - naltrexone  (DEPADE) 50 MG tablet; Take 0.5 tablets (25 mg total) by mouth daily.  Dispense: 30 tablet; Refill: 1   2. Cocaine use disorder, severe, dependence (HCC)   3. Major depressive disorder, recurrent episode, moderate (HCC)   - sertraline  (ZOLOFT ) 50 MG tablet; Take 1 tablet (50 mg total) by mouth daily.  Dispense: 30 tablet; Refill: 1 - traZODone  (DESYREL ) 100 MG tablet; Take 1 tablet (100 mg total) by mouth at bedtime.  Dispense: 30 tablet; Refill: 1  Chief Complaint: No chief complaint on file.  HPI:  Lakara D. Roebuck is a 63 year old female with a past psychiatric history significant for alcohol  use disorder (severe, dependence), cocaine use disorder (severe, dependence) and bipolar disorder who presents to [facility] to establish psychiatric care and for medication management.   Patient presents to encounter stating that she has been struggling with depression.  Patient reports that she has been struggling with depression for 2 years and attributes her depression to being homeless.  Patient is currently staying with her friend but states that her friend is starting to act like a second mother to her and she would like to remove herself from that situation.  She reports that she is trying to leave her current living situation by the end of the month  and plans on moving in with her daughter.  Patient reports that she has been on several medications in the past but states that the only medication she remembers  being on is Seroquel.   Patient endorses depression and rates her depression a 5 out of 10 with 10 being most severe.  Patient endorses depressive episodes 4 days out of the week.  Patient endorses the following depressive symptoms: feelings of sadness, lack of motivation, decreased concentration, decreased energy, decreased appetite, irritability, feelings of guilt/worthlessness, and hopelessness.  Patient reports that her depression is worsened by alcohol .  She reports that she has been drinking alcohol  in moderation (patient reports that she had a can of beer yesterday).  Patient denies any alleviating factors to her depression.  Patient reports that she does not get much sleep and states that she sleeps for roughly 2 hours at a time before waking back up.  Patient denies any anxiety at this time.   Patient endorses a past history of hospitalization due to mental health stating that she was hospitalized several years ago.  Patient endorses a past history of suicide attempt stating that she once tried to take her life through the use of razor blades.  A PHQ-9 screen was performed with the patient scoring a 21.  A GAD-7 screen was also performed with the patient scoring a 20.   Patient is alert and oriented x 4, calm, cooperative, and fully engaged in conversation during the encounter.  Patient endorses okay mood.  Patient exhibits depressed mood with congruent affect.  Patient denies suicidal or homicidal ideations.  She further denies auditory or visual hallucinations and does not appear to be responding to internal/external stimuli.  Patient denies paranoia or delusional thoughts.  Patient endorses poor sleep and receives on average 3 to 4 hours of sleep per night.  Patient endorses fair appetite and eats on average 1-2 meals per day.  Patient endorses alcohol  consumption stating that she had a can of beer yesterday.  Patient endorses tobacco use and smokes on average 2 cigarettes/day.  Patient endorses  illicit drug use stating that she last used cocaine 2 weeks ago.   Past Psychiatric History:  Patient endorses a past psychiatric history significant for bipolar disorder.  Patient admits to experiencing episodes of psychosis (paranoia and auditory hallucinations) while under the influence of alcohol  and cocaine.   Patient endorses a past history of hospitalization due to mental health.  She reports that she was hospitalized several years ago.  Patient also notes that she was seen at [facility] last year. - patient was assessed at [facility] (10/04/2023) for cannabis use disorder, stimulant use disorder, alcohol  use disorder, and adjustment disorder with depressed mood.   Patient endorses a past history of suicide attempt.  Patient reports that she has attempted through self-injurious behavior (cutting self with a razor blade).   Patient denies a past history of homicide attempt.  Past Medical History:  Past Medical History:  Diagnosis Date   Alcoholism (HCC)    ANEMIA, VITAMIN B12 DEFICIENCY 11/11/2007   Documented low 175 on 09/2007; received 6 yrs of IM therapy; switched to po 05/2013    Anxiety    Breast pain, left 06/02/2021   Cardiac arrest (HCC) 01/16/2013   witnessed, shockable rhythm, initally low EF, no CAD at cath. Drug screen negative.   Chest pain    a. 04/2009 Echo: EF 55-60%, Gr1 DD, Mild MR.   Depression    Duodenitis  secondary to H pylori(treatment completed 05/2006), +/- NSAIDS   Duodenitis    a. 05/2006 2/2 H pylori +/- NSAIDS   DVT (deep venous thrombosis) (HCC)    a. 04/2009 - treated with coumadin x 8 mos.   ETOH abuse    GERD (gastroesophageal reflux disease)    Heart murmur    History of cocaine abuse (HCC)    History of CVA (cerebrovascular accident)    Hypertension    Left arm pain 06/11/2017   PE 05/16/2009   CT angio positive for PE in 07/10, treated with coumadin for 8 months.    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 04/2009 - treated with coumadin x 8 mos.   Rectal bleeding 2007   internal hemmorhoids by colonoscopy in 04/2006, Bx neg for IBD   Rectal bleeding    a. int hemorrhoids by colonoscopy in 04/2006, Bx neg for IBD   Right foot pain 05/05/2020   Seizures (HCC)    Shortness of breath 05/28/2020   Stroke [facility])    Stroke [facility])    Tobacco abuse    Trochanteric bursitis of left hip 05/22/2016    Past Surgical History:  Procedure Laterality Date   CESAREAN SECTION     LEFT HEART CATHETERIZATION WITH CORONARY ANGIOGRAM N/A 06/23/2014   Procedure: LEFT HEART CATHETERIZATION WITH CORONARY ANGIOGRAM;  Surgeon: Ezra GORMAN Shuck, MD; No CAD    Family Psychiatric History:  Patient reports that her niece is on medication but is unsure of her diagnosis. Patient reports that her uncles have a history of fighting in [place] and contributed to her mental health to their experience while in [place].   Family History:  Family History  Problem Relation Age of Onset   Cancer Mother    Breast cancer Mother 37   Heart disease Mother    Diabetes Mother    CAD Mother    Heart attack Mother    Hypertension Mother    Cancer Father    Heart disease Father    Colon cancer Father    CAD Father    Cancer Sister    Breast cancer Sister 65   Diabetes Sister    Hypertension Sister    Heart disease Brother    Diabetes Brother    CAD Brother    Liver disease Niece 42   Esophageal cancer Neg Hx     Social History:  Social History   Socioeconomic History   Marital status: Single    Spouse name: Not on file   Number of children: 2   Years of education: Not on file   Highest education level: 11th grade  Occupational History   Occupation: Unemployed  Tobacco Use   Smoking status: Some Days    Current packs/day: 0.10    Types: Cigarettes   Smokeless tobacco: Never   Tobacco comments:    smokes about 1-2 cigs/day.  Has patches   Vaping Use   Vaping status: Never Used  Substance  and Sexual Activity   Alcohol  use: Yes    Comment: beer   Drug use: Yes    Types: Marijuana, Crack cocaine   Sexual activity: Yes    Partners: Male    Birth control/protection: Post-menopausal  Other Topics Concern   Not on file  Social History Narrative   ** Merged History Encounter **       ** Data from: 12/05/12 Enc Dept: [facility]   No cocaine or alcohol  use  since hospital discharge 05/19/2009.      10/13 update:  States she is smoking 1-2 cigarettes daily with at least 1 drink daily usually Brandy.                  ** Data from: 01/17/13 Enc Dept: [facility]   Pt lives in [place] with a roommate.      Social Drivers of Corporate investment banker Strain: Low Risk  (03/14/2023)   Overall Financial Resource Strain (CARDIA)    Difficulty of Paying Living Expenses: Not hard at all  Food Insecurity: No Food Insecurity (03/14/2023)   Hunger Vital Sign    Worried About Running Out of Food in the Last Year: Never true    Ran Out of Food in the Last Year: Never true  Transportation Needs: Unmet Transportation Needs (03/14/2023)   PRAPARE - Transportation    Lack of Transportation (Medical): Yes    Lack of Transportation (Non-Medical): Yes  Physical Activity: Sufficiently Active (03/14/2023)   Exercise Vital Sign    Days of Exercise per Week: 7 days    Minutes of Exercise per Session: 30 min  Stress: No Stress Concern Present (03/14/2023)   [employer] of Occupational Health - Occupational Stress Questionnaire    Feeling of Stress : Not at all  Social Connections: Moderately Isolated (03/14/2023)   Social Connection and Isolation Panel    Frequency of Communication with Friends and Family: More than three times a week    Frequency of Social Gatherings with Friends and Family: Once a week    Attends Religious Services: More than 4 times per year    Active Member of [employer] or Organizations: No    Attends Banker Meetings: Never    Marital Status:  Never married    Allergies:  Allergies  Allergen Reactions   Iodinated Contrast Media Other (See Comments)    Seizure   Omnipaque [Iohexol] Other (See Comments)    Seizure     Current Medications: Current Outpatient Medications  Medication Sig Dispense Refill   Acetaminophen  Extra Strength 500 MG CAPS Take 1 capsule by mouth 3 (three) times daily.     albuterol  (VENTOLIN  HFA) 108 (90 Base) MCG/ACT inhaler Inhale 2 puffs into the lungs every 4 (four) hours as needed for wheezing or shortness of breath. 6.7 g 0   aspirin  EC 81 MG tablet Take 1 tablet (81 mg total) by mouth daily. Swallow whole. 90 tablet 3   atorvastatin  (LIPITOR) 80 MG tablet Take 1 tablet (80 mg total) by mouth daily. 90 tablet 3   baclofen  (LIORESAL ) 10 MG tablet Take 1 tablet (10 mg total) by mouth 3 (three) times daily. (Patient not taking: Reported on 04/14/2024) 30 each 0   budesonide -formoterol  (SYMBICORT ) 160-4.5 MCG/ACT inhaler Inhale 2 puffs into the lungs daily. 10.2 g 2   carvedilol  (COREG ) 3.125 MG tablet Take 1 tablet (3.125 mg total) by mouth 2 (two) times daily. 180 tablet 3   Cholecalciferol  25 MCG (1000 UT) CHEW Chew 1 tablet (1,000 Units total) by mouth daily. (Patient not taking: Reported on 04/14/2024) 90 tablet 0   diclofenac  Sodium (VOLTAREN ) 1 % GEL Apply 2 g topically 4 (four) times daily. (Patient not taking: Reported on 04/14/2024) 100 g 0   furosemide  (LASIX ) 20 MG tablet Take 1 tablet (20 mg total) by mouth daily as needed. 30 tablet 1   gabapentin  (NEURONTIN ) 300 MG capsule Take 1 capsule (300 mg total) by mouth 3 (three)  times daily. 270 capsule 3   Melatonin 1 MG CAPS Take 0.5 capsules (0.5 mg total) by mouth at bedtime as needed. 90 capsule 3   naltrexone  (DEPADE) 50 MG tablet Take 0.5 tablets (25 mg total) by mouth daily. 30 tablet 1   nicotine  (NICODERM CQ  - DOSED IN MG/24 HR) 7 mg/24hr patch Place 1 patch (7 mg total) onto the skin daily. 30 patch 3   nitroGLYCERIN  (NITROSTAT ) 0.4 MG SL  tablet Place 1 tablet (0.4 mg total) under the tongue every 5 (five) minutes as needed for chest pain. 25 tablet 1   omeprazole  (PRILOSEC ) 40 MG capsule Take 1 capsule (40 mg total) by mouth daily. 30 capsule 3   sertraline  (ZOLOFT ) 50 MG tablet Take 1 tablet (50 mg total) by mouth daily. 30 tablet 1   traZODone  (DESYREL ) 100 MG tablet Take 1 tablet (100 mg total) by mouth at bedtime. 30 tablet 1   umeclidinium bromide  (INCRUSE ELLIPTA ) 62.5 MCG/ACT AEPB Inhale 1 puff into the lungs daily. 30 each 11   No current facility-administered medications for this visit.     Musculoskeletal: Strength & Muscle Tone: {desc; muscle tone:32375} Gait & Station: {PE GAIT ED WJUO:77474} Patient leans: {Patient Leans:21022755}  Psychiatric Specialty Exam: Last menstrual period 11/02/2010.There is no height or weight on file to calculate BMI. Review of Systems  General Appearance: {Appearance:22683}  Eye Contact:  {BHH EYE CONTACT:22684}  Speech:  {Speech:22685}  Volume:  {Volume (PAA):22686}  Mood:  {BHH MOOD:22306}  Affect:  {Affect (PAA):22687}  Thought Content: {Thought Content:22690}   Suicidal Thoughts:  {ST/HT (PAA):22692}  Homicidal Thoughts:  {ST/HT (PAA):22692}  Thought Process:  {Thought Process (PAA):22688}  Orientation:  {BHH ORIENTATION (PAA):22689}    Memory: {BHH MEMORY:22881}  Judgment:  {Judgement (PAA):22694}  Insight:  {Insight (PAA):22695}  Concentration:  {Concentration:21399}  Recall:  not formally assessed ***  Fund of Knowledge: {BHH GOOD/FAIR/POOR:22877}  Language: {BHH GOOD/FAIR/POOR:22877}  Psychomotor Activity:  {Psychomotor (PAA):22696}  Akathisia:  {BHH YES OR NO:22294}  AIMS (if indicated): {Desc; done/not:10129}  Assets:  {Assets (PAA):22698}  ADL's:  {BHH JIO'D:77709}  Cognition: {chl bhh cognition:304700322}  Sleep:  {BHH GOOD/FAIR/POOR:22877}   Metabolic Disorder Labs: Lab Results  Component Value Date   HGBA1C 6.0 (H) 06/05/2023   MPG 102.54  03/29/2022   MPG 103 05/17/2009   Lab Results  Component Value Date   PROLACTIN 15.5 03/29/2022   Lab Results  Component Value Date   CHOL 152 05/26/2024   TRIG 90 05/26/2024   HDL 58 05/26/2024   CHOLHDL 2.6 05/26/2024   VLDL 34 03/29/2022   LDLCALC 77 05/26/2024   LDLCALC 88 06/05/2023   Lab Results  Component Value Date   TSH 0.988 03/29/2022   TSH 1.100 06/21/2015    Therapeutic Level Labs: No results found for: LITHIUM No results found for: VALPROATE No results found for: CBMZ   Screenings: GAD-7    Flowsheet Row Office Visit from 06/20/2024 in [facility] Counselor from 10/10/2023 in [facility] Office Visit from 02/13/2023 in [facility] for [facility] [place]  Total GAD-7 Score 20 20 18    PHQ2-9    Flowsheet Row Office Visit from 06/20/2024 in [facility] Office Visit from 05/26/2024 in [facility] - A Dept Of [place]. [facility] Office Visit from 02/08/2024 in [facility] - A Dept Of [place]. [facility] Office Visit from 01/02/2024 in  [facility] - [facility] Dept Of [place]. [facility] Counselor from 10/10/2023 in [facility]  PHQ-2 Total Score 4 4 4  0 6  PHQ-9 Total Score 21 17 24  0 24   Flowsheet Row Office Visit from 06/20/2024 in [facility] Counselor from 10/10/2023 in [facility] ED from 10/04/2023 in [facility]  C-SSRS RISK CATEGORY Moderate Risk Error: Q3, 4, or 5 should not be populated when Q2 is No No Risk    Collaboration of Care: Collaboration of Care: [facility] OP Collaboration of Care:[id number]}  Patient/Guardian was advised Release of Information must be obtained prior to any record release in order to collaborate their care with an outside provider.  Patient/Guardian was advised if they have not already done so to contact the registration department to sign all necessary forms in order for us  to release information regarding their care.   Consent: Patient/Guardian gives verbal consent for treatment and assignment of benefits for services provided during this visit. Patient/Guardian expressed understanding and agreed to proceed.    Naquan Garman, MD 07/23/2024, 10:15 AM

## 2024-07-29 ENCOUNTER — Other Ambulatory Visit: Payer: Self-pay | Admitting: Student

## 2024-07-29 DIAGNOSIS — R053 Chronic cough: Secondary | ICD-10-CM

## 2024-08-05 ENCOUNTER — Encounter (HOSPITAL_COMMUNITY): Payer: MEDICAID

## 2024-09-03 NOTE — Progress Notes (Signed)
 BH MD/PA/NP OP Progress Note  09/04/2024 10:57 AM [name]  MRN:  [id number]  Visit Diagnosis:    ICD-10-CM   1. Major depressive disorder, recurrent episode, moderate (HCC)  F33.1 traZODone  (DESYREL ) 100 MG tablet    sertraline  (ZOLOFT ) 50 MG tablet    2. Alcohol  use disorder, severe, dependence (HCC)  F10.20 naltrexone  (DEPADE) 50 MG tablet    CBC with Differential    Comprehensive Metabolic Panel (CMET)    B12 and Folate Panel    TSH    3. Cocaine use disorder (HCC)  F14.10 Comprehensive Metabolic Panel (CMET)    EKG 12-Lead      Assessment:  [name] presents for follow-up evaluation. Today, 09/04/24, patient reports feeling depressed in the setting of psychosocial stressors that includes being homeless, living with her daughter and substance use, currently using cocaine, marijuana, smoking cigarettes and drinking alcohol  almost every day.  She has poor insight into her condition and is precontemplative about quitting all of the substances.  She has poor coping skills, reverts back to using cocaine and marijuana due to the ongoing stressors.  She does have a history of bipolar disorder, that is likely due to substance use, which she has been using over the past 20 years.  She is not actively or passively homicidal or suicidal, has had prior self-injurious behaviors years ago, no safety concerns currently.  She has been in a substance use program years ago, for 2 years, longest period of sobriety, however she is not amenable to getting treatment for substance use currently.  She has been having poor appetite and disturbed sleep and mild chest pain, ordered an EKG, also scheduled lab work for her since she has been actively drinking and using cocaine.  She has been noncompliant with her medications, encouraged her to take the medications as prescribed.  She was not anxious or paranoid and did not have any symptoms of mania.  We discussed the risk benefits and side effects of  her medications.  Given the active substance use, she would benefit from frequent in person appointments, will have her back in the clinic in 4 to 6 weeks.  Risk Assessment: An assessment of suicide and violence risk factors was performed as part of this evaluation and is not significantly changed from the last visit. While future psychiatric events cannot be accurately predicted, the patient does not currently require acute inpatient psychiatric care and does not currently meet [facility]  involuntary commitment criteria. Patient was given contact information for crisis resources, behavioral health clinic and was instructed to call 911 for emergencies.   Plan: # MDD, moderate, without psychotic features Past medication trials: Seroquel Status of problem: Current Interventions: -- Continue sertraline  50 mg daily for mood -- Continue trazodone  100 mg nightly for sleep  # Alcohol  use disorder, severe # Marijuana use  Past medication trials:  Status of problem: Current Interventions: -- Continue naltrexone  25 mg daily -- Ordered lab work today including EKG, CBC, CMP, B12 and folate levels -- Patient is currently precontemplative to quit drinking alcohol    # Cocaine use disorder, severe Past medication trials:  Status of problem: Current Interventions: -- Encouraged her to stop smoking cocaine -- Patient is currently precontemplative -- She will benefit from substance use counseling  Chief Complaint:  Chief Complaint  Patient presents with   Follow-up   Medication Refill   Depression   Anxiety   HPI:  [name] is a 63 year old female with a history of alcohol   use disorder, cocaine use disorder, bipolar disorder, MDD that presented for a follow-up.  She reported her mood as doing the best .  She denied any active or passive SI/HI/AVH.  Reported that she has still been having lack of motivation, decreased concentration, decreased energy, reduced appetite and disturbed sleep,  sleeping about 4 to 5 hours each night that has stayed the same since the previous visit.  Reported that the symptoms are because of homelessness, reported that she is staying with her daughter and she has been trying to find a place of her own.  She denied any symptoms of mania.  Reported that she has continued to smoke cocaine, last use was a week ago that she obtains from the streets, currently precontemplative about quitting it.  Reported that cocaine helps relaxes me.  Encouraged her to quit using cocaine.  She also reported smoking a pack of cigarette every other day, she has COPD and asthma, she is currently precontemplative about that as well.  She also reported drinking alcohol , currently drinking 1 beer a day which she does not want to quit currently.  Also she has been smoking marijuana every day it helps with anxiety , currently precontemplative about quitting that as well. She denied feeling anxious, denied any panic attacks, reported that anxiety is around the same psychosocial stressors.  Reported that she has been noncompliant with her medications, encouraged her to take the medications as prescribed.  She denied any side effects or any new physical concerns, reported mild chest pain, EKG ordered today.  We also ordered CBC CMP, B12 and folate level she and she has been actively drinking and using cocaine.  We will have her back in the clinic in 4 to 6 weeks.  Past Psychiatric History:  Patient endorses a past psychiatric history significant for bipolar disorder.  Patient admits to experiencing episodes of psychosis (paranoia and auditory hallucinations) while under the influence of alcohol  and cocaine.   Patient endorses a past history of hospitalization due to mental health.  She reports that she was hospitalized several years ago.  Patient also notes that she was seen at [facility] last year. - patient was assessed at [facility] (10/04/2023) for  cannabis use disorder, stimulant use disorder, alcohol  use disorder, and adjustment disorder with depressed mood.   Patient endorses a past history of suicide attempt.  Patient reports that she has attempted through self-injurious behavior (cutting self with a razor blade).   Patient denies a past history of homicide attempt.  Past Medical History:  Past Medical History:  Diagnosis Date   Alcoholism (HCC)    ANEMIA, VITAMIN B12 DEFICIENCY 11/11/2007   Documented low 175 on 09/2007; received 6 yrs of IM therapy; switched to po 05/2013    Anxiety    Breast pain, left 06/02/2021   Cardiac arrest (HCC) 01/16/2013   witnessed, shockable rhythm, initally low EF, no CAD at cath. Drug screen negative.   Chest pain    a. 04/2009 Echo: EF 55-60%, Gr1 DD, Mild MR.   Depression    Duodenitis    secondary to H pylori(treatment completed 05/2006), +/- NSAIDS   Duodenitis    a. 05/2006 2/2 H pylori +/- NSAIDS   DVT (deep venous thrombosis) (HCC)    a. 04/2009 - treated with coumadin x 8 mos.   ETOH abuse    GERD (gastroesophageal reflux disease)    Heart murmur    History of cocaine abuse (HCC)    History of  CVA (cerebrovascular accident)    Hypertension    Left arm pain 06/11/2017   PE 05/16/2009   CT angio positive for PE in 07/10, treated with coumadin for 8 months.    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 04/2009 - treated with coumadin x 8 mos.   Rectal bleeding 2007   internal hemmorhoids by colonoscopy in 04/2006, Bx neg for IBD   Rectal bleeding    a. int hemorrhoids by colonoscopy in 04/2006, Bx neg for IBD   Right foot pain 05/05/2020   Seizures (HCC)    Shortness of breath 05/28/2020   Stroke [facility])    Stroke [facility])    Tobacco abuse    Trochanteric bursitis of left hip 05/22/2016    Past Surgical History:  Procedure Laterality Date   CESAREAN SECTION     LEFT HEART CATHETERIZATION WITH CORONARY ANGIOGRAM N/A 06/23/2014   Procedure: LEFT HEART CATHETERIZATION WITH  CORONARY ANGIOGRAM;  Surgeon: Ezra GORMAN Shuck, MD; No CAD    Family Psychiatric History:  Patient reports that her niece is on medication but is unsure of her diagnosis. Patient reports that her uncles have a history of fighting in [place] and contributed to her mental health to their experience while in [place].   Family History:  Family History  Problem Relation Age of Onset   Cancer Mother    Breast cancer Mother 33   Heart disease Mother    Diabetes Mother    CAD Mother    Heart attack Mother    Hypertension Mother    Cancer Father    Heart disease Father    Colon cancer Father    CAD Father    Cancer Sister    Breast cancer Sister 51   Diabetes Sister    Hypertension Sister    Heart disease Brother    Diabetes Brother    CAD Brother    Liver disease Niece 30   Esophageal cancer Neg Hx     Social History:  Social History   Socioeconomic History   Marital status: Single    Spouse name: Not on file   Number of children: 2   Years of education: Not on file   Highest education level: 11th grade  Occupational History   Occupation: Unemployed  Tobacco Use   Smoking status: Some Days    Current packs/day: 0.10    Types: Cigarettes   Smokeless tobacco: Never   Tobacco comments:    smokes about 1-2 cigs/day.  Has patches   Vaping Use   Vaping status: Never Used  Substance and Sexual Activity   Alcohol  use: Yes    Comment: beer   Drug use: Yes    Types: Marijuana, Crack cocaine   Sexual activity: Yes    Partners: Male    Birth control/protection: Post-menopausal  Other Topics Concern   Not on file  Social History Narrative   ** Merged History Encounter **       ** Data from: 12/05/12 Enc Dept: [facility]   No cocaine or alcohol  use since hospital discharge 05/19/2009.      10/13 update:  States she is smoking 1-2 cigarettes daily with at least 1 drink daily usually Brandy.                  ** Data from: 01/17/13 Enc Dept: [facility]    Pt lives in [place] with a roommate.      Social Drivers  of Health   Financial Resource Strain: Low Risk  (03/14/2023)   Overall Financial Resource Strain (CARDIA)    Difficulty of Paying Living Expenses: Not hard at all  Food Insecurity: No Food Insecurity (03/14/2023)   Hunger Vital Sign    Worried About Running Out of Food in the Last Year: Never true    Ran Out of Food in the Last Year: Never true  Transportation Needs: Unmet Transportation Needs (03/14/2023)   PRAPARE - Transportation    Lack of Transportation (Medical): Yes    Lack of Transportation (Non-Medical): Yes  Physical Activity: Sufficiently Active (03/14/2023)   Exercise Vital Sign    Days of Exercise per Week: 7 days    Minutes of Exercise per Session: 30 min  Stress: No Stress Concern Present (03/14/2023)   [employer] of Occupational Health - Occupational Stress Questionnaire    Feeling of Stress : Not at all  Social Connections: Moderately Isolated (03/14/2023)   Social Connection and Isolation Panel    Frequency of Communication with Friends and Family: More than three times a week    Frequency of Social Gatherings with Friends and Family: Once a week    Attends Religious Services: More than 4 times per year    Active Member of [employer] or Organizations: No    Attends Banker Meetings: Never    Marital Status: Never married    Allergies:  Allergies  Allergen Reactions   Iodinated Contrast Media Other (See Comments)    Seizure   Omnipaque [Iohexol] Other (See Comments)    Seizure     Current Medications: Current Outpatient Medications  Medication Sig Dispense Refill   Acetaminophen  Extra Strength 500 MG CAPS Take 1 capsule by mouth 3 (three) times daily.     albuterol  (VENTOLIN  HFA) 108 (90 Base) MCG/ACT inhaler INHALE 2 PUFFS INTO LUNGS EVERY 4 HOURS AS NEEDED FOR WHEEZING OR SHORTNESS OF BREATH 18 g 12   aspirin  EC 81 MG tablet Take 1 tablet (81 mg total) by mouth daily. Swallow whole. 90  tablet 3   atorvastatin  (LIPITOR) 80 MG tablet Take 1 tablet (80 mg total) by mouth daily. 90 tablet 3   baclofen  (LIORESAL ) 10 MG tablet Take 1 tablet (10 mg total) by mouth 3 (three) times daily. (Patient not taking: Reported on 04/14/2024) 30 each 0   budesonide -formoterol  (SYMBICORT ) 160-4.5 MCG/ACT inhaler Inhale 2 puffs into the lungs daily. 10.2 g 2   carvedilol  (COREG ) 3.125 MG tablet Take 1 tablet (3.125 mg total) by mouth 2 (two) times daily. 180 tablet 3   Cholecalciferol  25 MCG (1000 UT) CHEW Chew 1 tablet (1,000 Units total) by mouth daily. (Patient not taking: Reported on 04/14/2024) 90 tablet 0   diclofenac  Sodium (VOLTAREN ) 1 % GEL Apply 2 g topically 4 (four) times daily. (Patient not taking: Reported on 04/14/2024) 100 g 0   furosemide  (LASIX ) 20 MG tablet Take 1 tablet (20 mg total) by mouth daily as needed. 30 tablet 1   gabapentin  (NEURONTIN ) 300 MG capsule Take 1 capsule (300 mg total) by mouth 3 (three) times daily. 270 capsule 3   Melatonin 1 MG CAPS Take 0.5 capsules (0.5 mg total) by mouth at bedtime as needed. 90 capsule 3   naltrexone  (DEPADE) 50 MG tablet Take 0.5 tablets (25 mg total) by mouth daily. 30 tablet 1   nicotine  (NICODERM CQ  - DOSED IN MG/24 HR) 7 mg/24hr patch Place 1 patch (7 mg total) onto the skin daily. 30 patch 3  nitroGLYCERIN  (NITROSTAT ) 0.4 MG SL tablet Place 1 tablet (0.4 mg total) under the tongue every 5 (five) minutes as needed for chest pain. 25 tablet 1   omeprazole  (PRILOSEC ) 40 MG capsule Take 1 capsule (40 mg total) by mouth daily. 30 capsule 3   sertraline  (ZOLOFT ) 50 MG tablet Take 1 tablet (50 mg total) by mouth daily. 30 tablet 1   traZODone  (DESYREL ) 100 MG tablet Take 1 tablet (100 mg total) by mouth at bedtime. 30 tablet 1   umeclidinium bromide  (INCRUSE ELLIPTA ) 62.5 MCG/ACT AEPB Inhale 1 puff into the lungs daily. 30 each 11   No current facility-administered medications for this visit.     Musculoskeletal: Strength & Muscle  Tone: within normal limits Gait & Station: normal Patient leans: N/A  Psychiatric Specialty Exam: Blood pressure (!) 123/57, pulse 72, height 5' 3 (1.6 m), weight 131 lb (59.4 kg), last menstrual period 11/02/2010.Body mass index is 23.21 kg/m. Review of Systems  General Appearance: Casual  Eye Contact:  Good  Speech:  Clear and Coherent  Volume:  Normal  Mood:  Euthymic  Affect:  Congruent  Thought Content: Logical   Suicidal Thoughts:  No  Homicidal Thoughts:  No  Thought Process:  Linear  Orientation:  Full (Time, Place, and Person)    Memory: Immediate;   Fair Recent;   Fair Remote;   Fair  Judgment:  Intact  Insight:  Fair  Concentration:  Concentration: Good and Attention Span: Good  Recall:  not formally assessed   Fund of Knowledge: Good  Language: Good  Psychomotor Activity:  Normal  Akathisia:  No  AIMS (if indicated): not done  Assets:  Communication Skills Desire for Improvement Financial Resources/Insurance Intimacy Social Support Transportation  ADL's:  Intact  Cognition: WNL  Sleep:  Fair   Metabolic Disorder Labs: Lab Results  Component Value Date   HGBA1C 6.0 (H) 06/05/2023   MPG 102.54 03/29/2022   MPG 103 05/17/2009   Lab Results  Component Value Date   PROLACTIN 15.5 03/29/2022   Lab Results  Component Value Date   CHOL 152 05/26/2024   TRIG 90 05/26/2024   HDL 58 05/26/2024   CHOLHDL 2.6 05/26/2024   VLDL 34 03/29/2022   LDLCALC 77 05/26/2024   LDLCALC 88 06/05/2023   Lab Results  Component Value Date   TSH 0.988 03/29/2022   TSH 1.100 06/21/2015    Therapeutic Level Labs: No results found for: LITHIUM No results found for: VALPROATE No results found for: CBMZ   Screenings: GAD-7    Flowsheet Row Office Visit from 06/20/2024 in [facility]or from 10/10/2023 in [facility] Office Visit from 02/13/2023 in [facility] for [facility] at  [place]  Total GAD-7 Score 20 20 18    PHQ2-9    Flowsheet Row Office Visit from 06/20/2024 in [facility] Office Visit from 05/26/2024 in [facility] Of [place]. [facility] Office Visit from 02/08/2024 in [facility] Of [place]. [facility] Office Visit from 01/02/2024 in [facility] Dept Of [place]. [facility] Counselor from 10/10/2023 in [facility]  PHQ-2 Total Score 4 4 4  0 6  PHQ-9 Total Score 21 17 24  0 24   Flowsheet Row Office Visit from 06/20/2024 in [facility] Counselor from 10/10/2023 in [place]  [facility] ED from 10/04/2023 in [facility]  C-SSRS RISK CATEGORY Moderate Risk Error: Q3, 4, or 5 should not be populated when Q2 is No No Risk    Collaboration of Care: Collaboration of Care: Medication Management AEB Dr. Susen  Patient/Guardian was advised Release of Information must be obtained prior to any record release in order to collaborate their care with an outside provider. Patient/Guardian was advised if they have not already done so to contact the registration department to sign all necessary forms in order for us  to release information regarding their care.   Consent: Patient/Guardian gives verbal consent for treatment and assignment of benefits for services provided during this visit. Patient/Guardian expressed understanding and agreed to proceed.    Camara Renstrom, MD 09/04/2024, 10:57 AM

## 2024-09-04 ENCOUNTER — Other Ambulatory Visit: Payer: Self-pay

## 2024-09-04 ENCOUNTER — Ambulatory Visit (INDEPENDENT_AMBULATORY_CARE_PROVIDER_SITE_OTHER): Payer: MEDICAID

## 2024-09-04 VITALS — BP 123/57 | HR 72 | Ht 63.0 in | Wt 131.0 lb

## 2024-09-04 DIAGNOSIS — F1020 Alcohol dependence, uncomplicated: Secondary | ICD-10-CM

## 2024-09-04 DIAGNOSIS — F1410 Cocaine abuse, uncomplicated: Secondary | ICD-10-CM | POA: Diagnosis not present

## 2024-09-04 DIAGNOSIS — F331 Major depressive disorder, recurrent, moderate: Secondary | ICD-10-CM | POA: Diagnosis not present

## 2024-09-04 MED ORDER — NALTREXONE HCL 50 MG PO TABS: 25.0000 mg | ORAL_TABLET | Freq: Every day | ORAL | 1 refills | Status: AC

## 2024-09-04 MED ORDER — SERTRALINE HCL 50 MG PO TABS: 50.0000 mg | ORAL_TABLET | Freq: Every day | ORAL | 1 refills | Status: AC

## 2024-09-04 MED ORDER — TRAZODONE HCL 100 MG PO TABS: 100.0000 mg | ORAL_TABLET | Freq: Every day | ORAL | 1 refills | Status: AC

## 2024-09-08 ENCOUNTER — Other Ambulatory Visit (INDEPENDENT_AMBULATORY_CARE_PROVIDER_SITE_OTHER): Payer: MEDICAID

## 2024-09-08 ENCOUNTER — Ambulatory Visit (HOSPITAL_COMMUNITY): Payer: MEDICAID

## 2024-09-08 DIAGNOSIS — Z79899 Other long term (current) drug therapy: Secondary | ICD-10-CM | POA: Diagnosis not present

## 2024-09-08 DIAGNOSIS — F1410 Cocaine abuse, uncomplicated: Secondary | ICD-10-CM | POA: Diagnosis not present

## 2024-09-08 DIAGNOSIS — F331 Major depressive disorder, recurrent, moderate: Secondary | ICD-10-CM | POA: Diagnosis not present

## 2024-09-08 DIAGNOSIS — F1020 Alcohol dependence, uncomplicated: Secondary | ICD-10-CM | POA: Diagnosis not present

## 2024-09-08 NOTE — Progress Notes (Signed)
 Patient presented to the office for labs, labs were drawn from [facility] with no issue or complaints . Pt left office alert and ambulatory.

## 2024-09-08 NOTE — Progress Notes (Signed)
 Patient presented to the office for an EKG, was preformed with no issue and pt will follow up with provider with results

## 2024-09-10 LAB — COMPREHENSIVE METABOLIC PANEL WITH GFR
ALT: 10 IU/L (ref 0–32)
AST: 22 IU/L (ref 0–40)
Albumin: 4.1 g/dL (ref 3.9–4.9)
Alkaline Phosphatase: 86 IU/L (ref 49–135)
BUN/Creatinine Ratio: 25 (ref 12–28)
BUN: 17 mg/dL (ref 8–27)
Bilirubin Total: 0.6 mg/dL (ref 0.0–1.2)
CO2: 19 mmol/L — ABNORMAL LOW (ref 20–29)
Calcium: 8.4 mg/dL — ABNORMAL LOW (ref 8.7–10.3)
Chloride: 105 mmol/L (ref 96–106)
Creatinine, Ser: 0.69 mg/dL (ref 0.57–1.00)
Globulin, Total: 2.4 g/dL (ref 1.5–4.5)
Glucose: 106 mg/dL — ABNORMAL HIGH (ref 70–99)
Potassium: 4.3 mmol/L (ref 3.5–5.2)
Sodium: 141 mmol/L (ref 134–144)
Total Protein: 6.5 g/dL (ref 6.0–8.5)
eGFR: 98 mL/min/1.73 (ref >59)

## 2024-09-10 LAB — CBC WITH DIFFERENTIAL/PLATELET
Basophils Absolute: 0 x10E3/uL (ref 0.0–0.2)
Basos: 0 %
EOS (ABSOLUTE): 0.4 x10E3/uL (ref 0.0–0.4)
Eos: 4 %
Hematocrit: 37.2 % (ref 34.0–46.6)
Hemoglobin: 11.4 g/dL (ref 11.1–15.9)
Immature Grans (Abs): 0 x10E3/uL (ref 0.0–0.1)
Immature Granulocytes: 0 %
Lymphocytes Absolute: 2.7 x10E3/uL (ref 0.7–3.1)
Lymphs: 28 %
MCH: 28.1 pg (ref 26.6–33.0)
MCHC: 30.6 g/dL — ABNORMAL LOW (ref 31.5–35.7)
MCV: 92 fL (ref 79–97)
Monocytes Absolute: 0.7 x10E3/uL (ref 0.1–0.9)
Monocytes: 7 %
Neutrophils Absolute: 5.8 x10E3/uL (ref 1.4–7.0)
Neutrophils: 61 %
Platelets: 300 x10E3/uL (ref 150–450)
RBC: 4.06 x10E6/uL (ref 3.77–5.28)
RDW: 13.1 % (ref 11.7–15.4)
WBC: 9.6 x10E3/uL (ref 3.4–10.8)

## 2024-09-10 LAB — B12 AND FOLATE PANEL
Folate: 13.8 ng/mL (ref >3.0)
Vitamin B-12: 232 pg/mL (ref 232–1245)

## 2024-09-23 ENCOUNTER — Other Ambulatory Visit: Payer: Self-pay | Admitting: Student

## 2024-09-23 ENCOUNTER — Ambulatory Visit: Payer: MEDICAID | Admitting: Student

## 2024-09-23 VITALS — BP 145/83 | HR 59 | Temp 97.8°F | Ht 63.0 in | Wt 129.4 lb

## 2024-09-23 DIAGNOSIS — F17210 Nicotine dependence, cigarettes, uncomplicated: Secondary | ICD-10-CM | POA: Diagnosis not present

## 2024-09-23 DIAGNOSIS — R87810 Cervical high risk human papillomavirus (HPV) DNA test positive: Secondary | ICD-10-CM

## 2024-09-23 DIAGNOSIS — K219 Gastro-esophageal reflux disease without esophagitis: Secondary | ICD-10-CM

## 2024-09-23 DIAGNOSIS — Z0000 Encounter for general adult medical examination without abnormal findings: Secondary | ICD-10-CM

## 2024-09-23 DIAGNOSIS — F1910 Other psychoactive substance abuse, uncomplicated: Secondary | ICD-10-CM

## 2024-09-23 DIAGNOSIS — Z720 Tobacco use: Secondary | ICD-10-CM

## 2024-09-23 DIAGNOSIS — Z1211 Encounter for screening for malignant neoplasm of colon: Secondary | ICD-10-CM

## 2024-09-23 DIAGNOSIS — E559 Vitamin D deficiency, unspecified: Secondary | ICD-10-CM | POA: Diagnosis not present

## 2024-09-23 DIAGNOSIS — J984 Other disorders of lung: Secondary | ICD-10-CM | POA: Diagnosis not present

## 2024-09-23 DIAGNOSIS — Z79899 Other long term (current) drug therapy: Secondary | ICD-10-CM

## 2024-09-23 DIAGNOSIS — Z8249 Family history of ischemic heart disease and other diseases of the circulatory system: Secondary | ICD-10-CM

## 2024-09-23 MED ORDER — VITAMIN D 1.25 MG (50000 UT) PO CAPS: 1.0000 | ORAL_CAPSULE | ORAL | 0 refills | Status: AC

## 2024-09-23 MED ORDER — UMECLIDINIUM BROMIDE 62.5 MCG/ACT IN AEPB: 1.0000 | INHALATION_SPRAY | Freq: Every day | RESPIRATORY_TRACT | 11 refills | Status: AC

## 2024-09-23 MED ORDER — FUROSEMIDE 20 MG PO TABS: 20.0000 mg | ORAL_TABLET | Freq: Every day | ORAL | 1 refills | Status: DC | PRN

## 2024-09-23 MED ORDER — BUDESONIDE-FORMOTEROL FUMARATE 160-4.5 MCG/ACT IN AERO: 2.0000 | INHALATION_SPRAY | Freq: Every day | RESPIRATORY_TRACT | 2 refills | Status: DC

## 2024-09-23 MED ORDER — NICOTINE 7 MG/24HR TD PT24: 7.0000 mg | MEDICATED_PATCH | TRANSDERMAL | 3 refills | Status: AC

## 2024-09-23 MED ORDER — OMEPRAZOLE 40 MG PO CPDR: 40.0000 mg | DELAYED_RELEASE_CAPSULE | Freq: Every day | ORAL | 3 refills | Status: AC

## 2024-09-23 NOTE — Patient Instructions (Addendum)
 Dorn Chill, MD [facility] Office: [phone]   Call [facility] Phone: [phone]  Check blood pressure 2-3 times per day for the next 2 weeks   Start vitamin D  1 capsule each week for 8 weeks  Schedule and appointment with Cardiology [phone]  Ask pharmacy about shingle vaccine

## 2024-09-23 NOTE — Telephone Encounter (Signed)
 Pharmacy requesting a 90 day supply  Medication sent to pharmacy.

## 2024-09-23 NOTE — Progress Notes (Signed)
 CC: Follow-up  HPI:  [name] is a 63 y.o. female living with a history stated below and presents today for follow-up. Please see problem based assessment and plan for additional details.  Past Medical History:  Diagnosis Date   Alcoholism (HCC)    ANEMIA, VITAMIN B12 DEFICIENCY 11/11/2007   Documented low 175 on 09/2007; received 6 yrs of IM therapy; switched to po 05/2013    Anxiety    Breast pain, left 06/02/2021   Cardiac arrest (HCC) 01/16/2013   witnessed, shockable rhythm, initally low EF, no CAD at cath. Drug screen negative.   Chest pain    a. 04/2009 Echo: EF 55-60%, Gr1 DD, Mild MR.   Depression    Duodenitis    secondary to H pylori(treatment completed 05/2006), +/- NSAIDS   Duodenitis    a. 05/2006 2/2 H pylori +/- NSAIDS   DVT (deep venous thrombosis) (HCC)    a. 04/2009 - treated with coumadin x 8 mos.   ETOH abuse    GERD (gastroesophageal reflux disease)    Heart murmur    History of cocaine abuse (HCC)    History of CVA (cerebrovascular accident)    Hypertension    Left arm pain 06/11/2017   PE 05/16/2009   CT angio positive for PE in 07/10, treated with coumadin for 8 months.    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 04/2009 - treated with coumadin x 8 mos.   Rectal bleeding 2007   internal hemmorhoids by colonoscopy in 04/2006, Bx neg for IBD   Rectal bleeding    a. int hemorrhoids by colonoscopy in 04/2006, Bx neg for IBD   Right foot pain 05/05/2020   Seizures (HCC)    Shortness of breath 05/28/2020   Stroke [facility])    Stroke [facility])    Tobacco abuse    Trochanteric bursitis of left hip 05/22/2016    Current Outpatient Medications on File Prior to Visit  Medication Sig Dispense Refill   Acetaminophen  Extra Strength 500 MG CAPS Take 1 capsule by mouth 3 (three) times daily.     albuterol  (VENTOLIN  HFA) 108 (90 Base) MCG/ACT inhaler INHALE 2 PUFFS INTO LUNGS EVERY 4 HOURS AS NEEDED FOR WHEEZING OR SHORTNESS OF BREATH 18 g 12    aspirin  EC 81 MG tablet Take 1 tablet (81 mg total) by mouth daily. Swallow whole. 90 tablet 3   atorvastatin  (LIPITOR) 80 MG tablet Take 1 tablet (80 mg total) by mouth daily. 90 tablet 3   carvedilol  (COREG ) 3.125 MG tablet Take 1 tablet (3.125 mg total) by mouth 2 (two) times daily. 180 tablet 3   gabapentin  (NEURONTIN ) 300 MG capsule Take 1 capsule (300 mg total) by mouth 3 (three) times daily. 270 capsule 3   naltrexone  (DEPADE) 50 MG tablet Take 0.5 tablets (25 mg total) by mouth daily. 30 tablet 1   nitroGLYCERIN  (NITROSTAT ) 0.4 MG SL tablet Place 1 tablet (0.4 mg total) under the tongue every 5 (five) minutes as needed for chest pain. 25 tablet 1   sertraline  (ZOLOFT ) 50 MG tablet Take 1 tablet (50 mg total) by mouth daily. 30 tablet 1   traZODone  (DESYREL ) 100 MG tablet Take 1 tablet (100 mg total) by mouth at bedtime. 30 tablet 1   No current facility-administered medications on file prior to visit.    Family History  Problem Relation Age of Onset   Cancer Mother    Breast cancer Mother 42   Heart  disease Mother    Diabetes Mother    CAD Mother    Heart attack Mother    Hypertension Mother    Cancer Father    Heart disease Father    Colon cancer Father    CAD Father    Cancer Sister    Breast cancer Sister 53   Diabetes Sister    Hypertension Sister    Heart disease Brother    Diabetes Brother    CAD Brother    Liver disease Niece 21   Esophageal cancer Neg Hx     Social History   Socioeconomic History   Marital status: Single    Spouse name: Not on file   Number of children: 2   Years of education: Not on file   Highest education level: 11th grade  Occupational History   Occupation: Unemployed  Tobacco Use   Smoking status: Some Days    Current packs/day: 0.10    Types: Cigarettes   Smokeless tobacco: Never   Tobacco comments:    smokes about 1-2 cigs/day.  Has patches   Vaping Use   Vaping status: Never Used  Substance and Sexual Activity   Alcohol   use: Yes    Comment: beer   Drug use: Yes    Types: Marijuana, Crack cocaine   Sexual activity: Yes    Partners: Male    Birth control/protection: Post-menopausal  Other Topics Concern   Not on file  Social History Narrative   ** Merged History Encounter **       ** Data from: 12/05/12 Enc Dept: [facility]   No cocaine or alcohol  use since hospital discharge 05/19/2009.      10/13 update:  States she is smoking 1-2 cigarettes daily with at least 1 drink daily usually Brandy.                  ** Data from: 01/17/13 Enc Dept: [facility]   Pt lives in [place] with a roommate.      Social Drivers of Corporate Investment Banker Strain: Low Risk  (03/14/2023)   Overall Financial Resource Strain (CARDIA)    Difficulty of Paying Living Expenses: Not hard at all  Food Insecurity: No Food Insecurity (03/14/2023)   Hunger Vital Sign    Worried About Running Out of Food in the Last Year: Never true    Ran Out of Food in the Last Year: Never true  Transportation Needs: Unmet Transportation Needs (03/14/2023)   PRAPARE - Transportation    Lack of Transportation (Medical): Yes    Lack of Transportation (Non-Medical): Yes  Physical Activity: Sufficiently Active (03/14/2023)   Exercise Vital Sign    Days of Exercise per Week: 7 days    Minutes of Exercise per Session: 30 min  Stress: No Stress Concern Present (03/14/2023)   [employer] of Occupational Health - Occupational Stress Questionnaire    Feeling of Stress : Not at all  Social Connections: Moderately Isolated (03/14/2023)   Social Connection and Isolation Panel    Frequency of Communication with Friends and Family: More than three times a week    Frequency of Social Gatherings with Friends and Family: Once a week    Attends Religious Services: More than 4 times per year    Active Member of [employer] or Organizations: No    Attends Banker Meetings: Never    Marital Status: Never married  Intimate  Partner Violence: Not At Risk (03/14/2023)  Humiliation, Afraid, Rape, and Kick questionnaire    Fear of Current or Ex-Partner: No    Emotionally Abused: No    Physically Abused: No    Sexually Abused: No    Review of Systems: ROS negative except for what is noted on the assessment and plan.  Vitals:   09/23/24 0832 09/23/24 0847  BP: (!) 150/80 (!) 145/83  Pulse: 64 (!) 59  Temp: 97.8 F (36.6 C)   TempSrc: Oral   SpO2: 99%   Weight: 129 lb 6.4 oz (58.7 kg)   Height: 5' 3 (1.6 m)     Physical Exam: Constitutional: well-appearing, sitting in chair, in no acute distress Cardiovascular: regular rate and rhythm, no m/r/g Pulmonary/Chest: normal work of breathing on room air, lungs clear to auscultation bilaterally Skin: warm and dry Psych: normal mood and behavior  Assessment & Plan:  Patient discussed with Dr. Jeanelle  Restrictive lung disease Follows with Pulmonology but is overdue for appointment. Lungs CTAB today. Refilled Symbicort  and Incruse Ellipta .  Vitamin D  deficiency Has not been taking supplemental Vitamin D . Will re-check level today as send in 50,000 units weekly for 8 weeks.  Health care maintenance Referral for cervical cancer screening (high risk HPV on last pap) and colonoscopy.  Tobacco use Smoking 3 cigarettes/day. Counseled on cessation and sent nicotine  patches.  GERD (gastroesophageal reflux disease) Refilled Prilosec .  Polysubstance abuse (with elevated transaminases)  Has not smoked crack-cockaine in 3 weeks. States living with her daughter and staying away from the playgrounds has helped. Now following with [facility]. They started Naltrexone .    Norman Lobstein, D.O. [facility], PGY-2 Phone: [phone] Date 09/29/2024 Time 8:22 AM

## 2024-09-24 ENCOUNTER — Ambulatory Visit: Payer: Self-pay | Admitting: Student

## 2024-09-24 ENCOUNTER — Ambulatory Visit: Payer: MEDICAID | Admitting: Pulmonary Disease

## 2024-09-24 ENCOUNTER — Other Ambulatory Visit: Payer: Self-pay | Admitting: Student

## 2024-09-24 LAB — VITAMIN D 25 HYDROXY (VIT D DEFICIENCY, FRACTURES): Vit D, 25-Hydroxy: 9 ng/mL — ABNORMAL LOW (ref 30.0–100.0)

## 2024-09-29 NOTE — Assessment & Plan Note (Signed)
Refilled Prilosec

## 2024-09-29 NOTE — Assessment & Plan Note (Addendum)
 Follows with Pulmonology but is overdue for appointment. Lungs CTAB today. Refilled Symbicort  and Incruse Ellipta .

## 2024-09-29 NOTE — Assessment & Plan Note (Addendum)
 Has not smoked crack-cockaine in 3 weeks. States living with her daughter and staying away from the playgrounds has helped. Now following with [facility]. They started Naltrexone .

## 2024-09-29 NOTE — Assessment & Plan Note (Signed)
 Has not been taking supplemental Vitamin D . Will re-check level today as send in 50,000 units weekly for 8 weeks.

## 2024-09-29 NOTE — Assessment & Plan Note (Signed)
 Smoking 3 cigarettes/day. Counseled on cessation and sent nicotine  patches.

## 2024-09-29 NOTE — Assessment & Plan Note (Signed)
 Referral for cervical cancer screening (high risk HPV on last pap) and colonoscopy.

## 2024-09-30 NOTE — Progress Notes (Signed)
 Internal Medicine Clinic Attending  Case discussed with the resident at the time of the visit.  We reviewed the resident's history and exam and pertinent patient test results.  I agree with the assessment, diagnosis, and plan of care documented in the resident's note.

## 2024-10-06 ENCOUNTER — Ambulatory Visit (INDEPENDENT_AMBULATORY_CARE_PROVIDER_SITE_OTHER): Payer: MEDICAID | Admitting: Neurology

## 2024-10-06 ENCOUNTER — Encounter: Payer: Self-pay | Admitting: Neurology

## 2024-10-06 VITALS — BP 137/75 | HR 66 | Ht 63.0 in

## 2024-10-06 DIAGNOSIS — R404 Transient alteration of awareness: Secondary | ICD-10-CM

## 2024-10-06 DIAGNOSIS — I639 Cerebral infarction, unspecified: Secondary | ICD-10-CM

## 2024-10-06 NOTE — Progress Notes (Signed)
 Chief Complaint  Patient presents with   Cerebrovascular Accident    Rm12,alone,  cva follow up 6 months      ASSESSMENT AND PLAN  [name] is a 63 y.o. female   History of right MCA, left PCA stroke Passing out episode  Vascular risk factor of hypertension, hyperlipidemia polysubstance abuse, most recent UDS was positive for cocaine in December 2024, also had a history of alcohol , tobacco abuse,   MRI of the brain showed right cortical and left occipital stroke, in embolic pattern  High risk for noncompliance, also with poor social support, will only do aspirin  81 mg as stroke prevention, referred to loop recorder  EEG was normal  No recurrent passing out episode, will not start antiepileptic medications, she is to call office for recurrent event  DIAGNOSTIC DATA (LABS, IMAGING, TESTING) - I reviewed patient records, labs, notes, testing and imaging myself where available.   MEDICAL HISTORY:  [name], is a 63 year old female, seen in request by   Trudy Mliss Dragon, MD [address] [place], [address] [place],  [place] [address], Marylu Gee, DO   History is obtained from the patient and review of electronic medical records. I personally reviewed pertinent available imaging films in PACS.   PMHx of  GERD HTN HLD Hx of DVT, PE Hx of cocaine, alcohol , smoke, marijuana Hx of seizure, related to alcohol  withdraw, last seizure was more than 9 years ago, not on AED.  History of MI in 1994,   Patient is alone at today's clinical visit, poor historian, lives with her daughter, going to move, never drive in her life, lives off Social Security  She had a history of stroke-many years ago, it was visible in her CT head from 2003, she could not elaborate on the detail, but had residual left side numbness since the stroke, her left-sided symptoms continue to getting worse over the years, she also noticed mild weakness on the left side  Most recent MRI of the brain and  cervical spine was from September 2018, stable MRI appearance since 2008 with chronic encephalomalacia involving large portion of the right MCA territory, small distal left PCA territory, essentially normal MRI of cervical spine  She also had a history of seizure, but is not on antiepileptic medications, reported previous seizures associated with alcohol  use, but couple weeks ago, her friend witnessed a sudden onset of passing out episode, she has no warning signs,   UDS in December 2024 was positive for cocaine, essentially normal CMP in April 2025  UPDATE Dec 8th 2028: She lives with her daughter now, there was no recurrent passing out episode, she never driven, daughter brought her daughter alone at today's visit, she continue to smoke smoke 1/2 ppd, drink beer  2 cans x12 Oz, zoloft  has helped her sleep and anxiety,   Personally reviewed MRI of the brain without contrast June 2025, remote right hemisphere infarction involving right frontal parietal lobe, some extension to the temporal lobe, remote left occipital infarction,  ECHO Cardiogram was normal in Dec 2024 5 days cardiac monitoring in May 2025 was normal, no evidence of atrial fibrillation  Ultrasound of carotid artery showed no significant abnormalities. EEG May 2025 was normal.  PHYSICAL EXAM:   Vitals:   10/06/24 1259  BP: 137/75  Pulse: 66  SpO2: 93%  Height: 5' 3 (1.6 m)     Body mass index is 22.92 kg/m.  PHYSICAL EXAMNIATION:  Gen: NAD, conversant, well nourised, well groomed  Cardiovascular: Regular rate rhythm, no peripheral edema, warm, nontender. Eyes: Conjunctivae clear without exudates or hemorrhage Neck: Supple, no carotid bruits. Pulmonary: Clear to auscultation bilaterally   NEUROLOGICAL EXAM:  MENTAL STATUS: Speech/cognition: Awake, poor historian, difficulty following three-step commands, CRANIAL NERVES: CN II: Left Hemi visual field deficit pupils are round equal and  briskly reactive to light. CN III, IV, VI: extraocular movement are normal. No ptosis. CN V: Facial sensation is intact to light touch CN VII: Face is symmetric with normal eye closure  CN VIII: Hearing is normal to causal conversation. CN IX, X: Phonation is normal. CN XI: Head turning and shoulder shrug are intact  MOTOR: Mild fixation of left upper extremity on rapid rotating movement  REFLEXES: Hyperreflexia  SENSORY: Intact to light touch  COORDINATION: There is no trunk or limb dysmetria noted.  GAIT/STANCE: Posture is normal. Gait is steady    REVIEW OF SYSTEMS:  Full 14 system review of systems performed and notable only for as above All other review of systems were negative.   ALLERGIES: Allergies  Allergen Reactions   Iodinated Contrast Media Other (See Comments)    Seizure   Omnipaque [Iohexol] Other (See Comments)    Seizure     HOME MEDICATIONS: Current Outpatient Medications  Medication Sig Dispense Refill   Acetaminophen  Extra Strength 500 MG CAPS Take 1 capsule by mouth 3 (three) times daily.     albuterol  (VENTOLIN  HFA) 108 (90 Base) MCG/ACT inhaler INHALE 2 PUFFS INTO LUNGS EVERY 4 HOURS AS NEEDED FOR WHEEZING OR SHORTNESS OF BREATH 18 g 12   aspirin  EC 81 MG tablet Take 1 tablet (81 mg total) by mouth daily. Swallow whole. 90 tablet 3   atorvastatin  (LIPITOR) 80 MG tablet Take 1 tablet (80 mg total) by mouth daily. 90 tablet 3   budesonide -formoterol  (SYMBICORT ) 160-4.5 MCG/ACT inhaler Inhale 2 puffs into the lungs daily. 10.2 g 2   carvedilol  (COREG ) 3.125 MG tablet Take 1 tablet (3.125 mg total) by mouth 2 (two) times daily. 180 tablet 3   Cholecalciferol  (VITAMIN D ) 1.25 MG (50000 UT) CAPS Take 1 capsule by mouth once a week. 8 capsule 0   furosemide  (LASIX ) 20 MG tablet TAKE 1 TABLET(20 MG) BY MOUTH DAILY AS NEEDED 90 tablet 1   gabapentin  (NEURONTIN ) 300 MG capsule Take 1 capsule (300 mg total) by mouth 3 (three) times daily. 270 capsule 3    naltrexone  (DEPADE) 50 MG tablet Take 0.5 tablets (25 mg total) by mouth daily. 30 tablet 1   nicotine  (NICODERM CQ  - DOSED IN MG/24 HR) 7 mg/24hr patch Place 1 patch (7 mg total) onto the skin daily. 30 patch 3   nitroGLYCERIN  (NITROSTAT ) 0.4 MG SL tablet Place 1 tablet (0.4 mg total) under the tongue every 5 (five) minutes as needed for chest pain. 25 tablet 1   omeprazole  (PRILOSEC ) 40 MG capsule Take 1 capsule (40 mg total) by mouth daily. 90 capsule 3   sertraline  (ZOLOFT ) 50 MG tablet Take 1 tablet (50 mg total) by mouth daily. 30 tablet 1   traZODone  (DESYREL ) 100 MG tablet Take 1 tablet (100 mg total) by mouth at bedtime. 30 tablet 1   umeclidinium bromide  (INCRUSE ELLIPTA ) 62.5 MCG/ACT AEPB Inhale 1 puff into the lungs daily. 30 each 11   No current facility-administered medications for this visit.    PAST MEDICAL HISTORY: Past Medical History:  Diagnosis Date   Alcoholism (HCC)    ANEMIA, VITAMIN B12 DEFICIENCY 11/11/2007   Documented low  175 on 09/2007; received 6 yrs of IM therapy; switched to po 05/2013    Anxiety    Breast pain, left 06/02/2021   Cardiac arrest (HCC) 01/16/2013   witnessed, shockable rhythm, initally low EF, no CAD at cath. Drug screen negative.   Chest pain    a. 04/2009 Echo: EF 55-60%, Gr1 DD, Mild MR.   Depression    Duodenitis    secondary to H pylori(treatment completed 05/2006), +/- NSAIDS   Duodenitis    a. 05/2006 2/2 H pylori +/- NSAIDS   DVT (deep venous thrombosis) (HCC)    a. 04/2009 - treated with coumadin x 8 mos.   ETOH abuse    GERD (gastroesophageal reflux disease)    Heart murmur    History of cocaine abuse (HCC)    History of CVA (cerebrovascular accident)    Hypertension    Left arm pain 06/11/2017   PE 05/16/2009   CT angio positive for PE in 07/10, treated with coumadin for 8 months.    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 04/2009 - treated with coumadin x 8 mos.   Rectal bleeding 2007   internal hemmorhoids by  colonoscopy in 04/2006, Bx neg for IBD   Rectal bleeding    a. int hemorrhoids by colonoscopy in 04/2006, Bx neg for IBD   Right foot pain 05/05/2020   Seizures (HCC)    Shortness of breath 05/28/2020   Stroke (HCC)    Stroke (HCC)    Tobacco abuse    Trochanteric bursitis of left hip 05/22/2016    PAST SURGICAL HISTORY: Past Surgical History:  Procedure Laterality Date   CESAREAN SECTION     LEFT HEART CATHETERIZATION WITH CORONARY ANGIOGRAM N/A 06/23/2014   Procedure: LEFT HEART CATHETERIZATION WITH CORONARY ANGIOGRAM;  Surgeon: Ezra GORMAN Shuck, MD; No CAD    FAMILY HISTORY: Family History  Problem Relation Age of Onset   Cancer Mother    Breast cancer Mother 46   Heart disease Mother    Diabetes Mother    CAD Mother    Heart attack Mother    Hypertension Mother    Cancer Father    Heart disease Father    Colon cancer Father    CAD Father    Cancer Sister    Breast cancer Sister 56   Diabetes Sister    Hypertension Sister    Heart disease Brother    Diabetes Brother    CAD Brother    Liver disease Niece 36   Esophageal cancer Neg Hx     SOCIAL HISTORY: Social History   Socioeconomic History   Marital status: Single    Spouse name: Not on file   Number of children: 2   Years of education: Not on file   Highest education level: 11th grade  Occupational History   Occupation: Unemployed  Tobacco Use   Smoking status: Some Days    Current packs/day: 0.10    Types: Cigarettes   Smokeless tobacco: Never   Tobacco comments:    smokes about 1-2 cigs/day.  Has patches   Vaping Use   Vaping status: Never Used  Substance and Sexual Activity   Alcohol  use: Yes    Comment: beer   Drug use: Yes    Types: Marijuana, Crack cocaine   Sexual activity: Yes    Partners: Male    Birth control/protection: Post-menopausal  Other Topics Concern   Not on file  Social History Narrative   **  Merged History Encounter **       ** Data from: 12/05/12 Enc [facility]   No cocaine or alcohol  use since hospital discharge 05/19/2009.      10/13 update:  States she is smoking 1-2 cigarettes daily with at least 1 drink daily usually Brandy.                  ** Data from: 01/17/13 Enc Dept: [facility]   Pt lives in [place] with a roommate.      Social Drivers of Corporate Investment Banker Strain: Low Risk  (03/14/2023)   Overall Financial Resource Strain (CARDIA)    Difficulty of Paying Living Expenses: Not hard at all  Food Insecurity: No Food Insecurity (03/14/2023)   Hunger Vital Sign    Worried About Running Out of Food in the Last Year: Never true    Ran Out of Food in the Last Year: Never true  Transportation Needs: Unmet Transportation Needs (03/14/2023)   PRAPARE - Transportation    Lack of Transportation (Medical): Yes    Lack of Transportation (Non-Medical): Yes  Physical Activity: Sufficiently Active (03/14/2023)   Exercise Vital Sign    Days of Exercise per Week: 7 days    Minutes of Exercise per Session: 30 min  Stress: No Stress Concern Present (03/14/2023)   [employer] of Occupational Health - Occupational Stress Questionnaire    Feeling of Stress : Not at all  Social Connections: Moderately Isolated (03/14/2023)   Social Connection and Isolation Panel    Frequency of Communication with Friends and Family: More than three times a week    Frequency of Social Gatherings with Friends and Family: Once a week    Attends Religious Services: More than 4 times per year    Active Member of [employer] or Organizations: No    Attends Banker Meetings: Never    Marital Status: Never married  Intimate Partner Violence: Not At Risk (03/14/2023)   Humiliation, Afraid, Rape, and Kick questionnaire    Fear of Current or Ex-Partner: No    Emotionally Abused: No    Physically Abused: No    Sexually Abused: No      Modena Callander, M.D. Ph.D.  [facility] [address] [place], [place]  [address] Ph: [phone] Fax: [phone]  CC:  Marylu Gee, DO [address] [place],  [place] [address]  Marylu Gee, DO

## 2024-10-07 ENCOUNTER — Telehealth: Payer: Self-pay | Admitting: Neurology

## 2024-10-07 NOTE — Telephone Encounter (Signed)
 Referral for cardiology fax to [facility]

## 2024-10-09 ENCOUNTER — Encounter (HOSPITAL_COMMUNITY): Payer: MEDICAID

## 2024-10-09 NOTE — Progress Notes (Deleted)
 BH MD/PA/NP OP Progress Note  10/09/2024 8:46 AM [name]  MRN:  [id number]  Visit Diagnosis:  No diagnosis found.   Assessment:  [name] presents for follow-up evaluation. Today, 10/09/2024, patient reports feeling depressed in the setting of psychosocial stressors that includes being homeless, living with her daughter and substance use, currently using cocaine, marijuana, smoking cigarettes and drinking alcohol  almost every day.  She has poor insight into her condition and is precontemplative about quitting all of the substances.  She has poor coping skills, reverts back to using cocaine and marijuana due to the ongoing stressors.  She does have a history of bipolar disorder, that is likely due to substance use, which she has been using over the past 20 years.  She is not actively or passively homicidal or suicidal, has had prior self-injurious behaviors years ago, no safety concerns currently.  She has been in a substance use program years ago, for 2 years, longest period of sobriety, however she is not amenable to getting treatment for substance use currently.  She has been having poor appetite and disturbed sleep and mild chest pain, ordered an EKG, also scheduled lab work for her since she has been actively drinking and using cocaine.  She has been noncompliant with her medications, encouraged her to take the medications as prescribed.  She was not anxious or paranoid and did not have any symptoms of mania.  We discussed the risk benefits and side effects of her medications.  Given the active substance use, she would benefit from frequent in person appointments, will have her back in the clinic in 4 to 6 weeks.  Risk Assessment: An assessment of suicide and violence risk factors was performed as part of this evaluation and is not significantly changed from the last visit. While future psychiatric events cannot be accurately predicted, the patient does not currently require  acute inpatient psychiatric care and does not currently meet Prosser  involuntary commitment criteria. Patient was given contact information for crisis resources, behavioral health clinic and was instructed to call 911 for emergencies.   Plan: # MDD, moderate, without psychotic features Past medication trials: Seroquel Status of problem: Current Interventions: -- Continue sertraline  50 mg daily for mood -- Continue trazodone  100 mg nightly for sleep  # Alcohol  use disorder, severe # Marijuana use  Past medication trials:  Status of problem: Current Interventions: -- Continue naltrexone  25 mg daily -- Ordered lab work today including EKG, CBC, CMP, B12 and folate levels -- Patient is currently precontemplative to quit drinking alcohol    # Cocaine use disorder, severe Past medication trials:  Status of problem: Current Interventions: -- Encouraged her to stop smoking cocaine -- Patient is currently precontemplative -- She will benefit from substance use counseling  Chief Complaint:  No chief complaint on file.  HPI:  [name] is a 63 year old female with a history of alcohol  use disorder, cocaine use disorder, bipolar disorder, MDD that presented for a follow-up.  She reported her mood as doing the best .  She denied any active or passive SI/HI/AVH.  Reported that she has still been having lack of motivation, decreased concentration, decreased energy, reduced appetite and disturbed sleep, sleeping about 4 to 5 hours each night that has stayed the same since the previous visit.  Reported that the symptoms are because of homelessness, reported that she is staying with her daughter and she has been trying to find a place of her own.  She denied any symptoms of  mania.  Reported that she has continued to smoke cocaine, last use was a week ago that she obtains from the streets, currently precontemplative about quitting it.  Reported that cocaine helps relaxes me.  Encouraged her  to quit using cocaine.  She also reported smoking a pack of cigarette every other day, she has COPD and asthma, she is currently precontemplative about that as well.  She also reported drinking alcohol , currently drinking 1 beer a day which she does not want to quit currently.  Also she has been smoking marijuana every day it helps with anxiety , currently precontemplative about quitting that as well. She denied feeling anxious, denied any panic attacks, reported that anxiety is around the same psychosocial stressors.  Reported that she has been noncompliant with her medications, encouraged her to take the medications as prescribed.  She denied any side effects or any new physical concerns, reported mild chest pain, EKG ordered today.  We also ordered CBC CMP, B12 and folate level she and she has been actively drinking and using cocaine.  We will have her back in the clinic in 4 to 6 weeks.  Past Psychiatric History:  Patient endorses a past psychiatric history significant for bipolar disorder.  Patient admits to experiencing episodes of psychosis (paranoia and auditory hallucinations) while under the influence of alcohol  and cocaine.   Patient endorses a past history of hospitalization due to mental health.  She reports that she was hospitalized several years ago.  Patient also notes that she was seen at [facility] last year. - patient was assessed at [facility] (10/04/2023) for cannabis use disorder, stimulant use disorder, alcohol  use disorder, and adjustment disorder with depressed mood.   Patient endorses a past history of suicide attempt.  Patient reports that she has attempted through self-injurious behavior (cutting self with a razor blade).   Patient denies a past history of homicide attempt.  Past Medical History:  Past Medical History:  Diagnosis Date   Alcoholism (HCC)    ANEMIA, VITAMIN B12 DEFICIENCY 11/11/2007   Documented low 175 on 09/2007;  received 6 yrs of IM therapy; switched to po 05/2013    Anxiety    Breast pain, left 06/02/2021   Cardiac arrest (HCC) 01/16/2013   witnessed, shockable rhythm, initally low EF, no CAD at cath. Drug screen negative.   Chest pain    a. 04/2009 Echo: EF 55-60%, Gr1 DD, Mild MR.   Depression    Duodenitis    secondary to H pylori(treatment completed 05/2006), +/- NSAIDS   Duodenitis    a. 05/2006 2/2 H pylori +/- NSAIDS   DVT (deep venous thrombosis) (HCC)    a. 04/2009 - treated with coumadin x 8 mos.   ETOH abuse    GERD (gastroesophageal reflux disease)    Heart murmur    History of cocaine abuse (HCC)    History of CVA (cerebrovascular accident)    Hypertension    Left arm pain 06/11/2017   PE 05/16/2009   CT angio positive for PE in 07/10, treated with coumadin for 8 months.    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 04/2009 - treated with coumadin x 8 mos.   Rectal bleeding 2007   internal hemmorhoids by colonoscopy in 04/2006, Bx neg for IBD   Rectal bleeding    a. int hemorrhoids by colonoscopy in 04/2006, Bx neg for IBD   Right foot pain 05/05/2020   Seizures (HCC)  Shortness of breath 05/28/2020   Stroke [facility])    Stroke [facility])    Tobacco abuse    Trochanteric bursitis of left hip 05/22/2016    Past Surgical History:  Procedure Laterality Date   CESAREAN SECTION     LEFT HEART CATHETERIZATION WITH CORONARY ANGIOGRAM N/A 06/23/2014   Procedure: LEFT HEART CATHETERIZATION WITH CORONARY ANGIOGRAM;  Surgeon: Ezra GORMAN Shuck, MD; No CAD    Family Psychiatric History:  Patient reports that her niece is on medication but is unsure of her diagnosis. Patient reports that her uncles have a history of fighting in [place] and contributed to her mental health to their experience while in [place].   Family History:  Family History  Problem Relation Age of Onset   Cancer Mother    Breast cancer Mother 38   Heart disease Mother    Diabetes Mother    CAD Mother     Heart attack Mother    Hypertension Mother    Cancer Father    Heart disease Father    Colon cancer Father    CAD Father    Cancer Sister    Breast cancer Sister 4   Diabetes Sister    Hypertension Sister    Heart disease Brother    Diabetes Brother    CAD Brother    Liver disease Niece 58   Esophageal cancer Neg Hx     Social History:  Social History   Socioeconomic History   Marital status: Single    Spouse name: Not on file   Number of children: 2   Years of education: Not on file   Highest education level: 11th grade  Occupational History   Occupation: Unemployed  Tobacco Use   Smoking status: Some Days    Current packs/day: 0.10    Types: Cigarettes   Smokeless tobacco: Never   Tobacco comments:    smokes about 1-2 cigs/day.  Has patches   Vaping Use   Vaping status: Never Used  Substance and Sexual Activity   Alcohol  use: Yes    Comment: beer   Drug use: Yes    Types: Marijuana, Crack cocaine   Sexual activity: Yes    Partners: Male    Birth control/protection: Post-menopausal  Other Topics Concern   Not on file  Social History Narrative   ** Merged History Encounter **       ** Data from: 12/05/12 Enc Dept: [facility]   No cocaine or alcohol  use since hospital discharge 05/19/2009.      10/13 update:  States she is smoking 1-2 cigarettes daily with at least 1 drink daily usually Brandy.                  ** Data from: 01/17/13 Enc Dept: [facility]   Pt lives in [place] with a roommate.      Social Drivers of Health   Tobacco Use: High Risk (10/06/2024)   Patient History    Smoking Tobacco Use: Some Days    Smokeless Tobacco Use: Never    Passive Exposure: Not on file  Financial Resource Strain: Low Risk (03/14/2023)   Overall Financial Resource Strain (CARDIA)    Difficulty of Paying Living Expenses: Not hard at all  Food Insecurity: No Food Insecurity (03/14/2023)   Hunger Vital Sign    Worried About Running Out of Food in  the Last Year: Never true    Ran Out of Food in the Last Year: Never true  Transportation Needs: Unmet Transportation Needs (03/14/2023)   PRAPARE - Administrator, Civil Service (Medical): Yes    Lack of Transportation (Non-Medical): Yes  Physical Activity: Sufficiently Active (03/14/2023)   Exercise Vital Sign    Days of Exercise per Week: 7 days    Minutes of Exercise per Session: 30 min  Stress: No Stress Concern Present (03/14/2023)   [employer] of Occupational Health - Occupational Stress Questionnaire    Feeling of Stress : Not at all  Social Connections: Moderately Isolated (03/14/2023)   Social Connection and Isolation Panel    Frequency of Communication with Friends and Family: More than three times a week    Frequency of Social Gatherings with Friends and Family: Once a week    Attends Religious Services: More than 4 times per year    Active Member of Clubs or Organizations: No    Attends Banker Meetings: Never    Marital Status: Never married  Depression (PHQ2-9): High Risk (06/20/2024)   Depression (PHQ2-9)    PHQ-2 Score: 21  Alcohol  Screen: Low Risk (03/14/2023)   Alcohol  Screen    Last Alcohol  Screening Score (AUDIT): 1  Housing: High Risk (03/14/2023)   Housing    Last Housing Risk Score: 3  Utilities: Not At Risk (03/14/2023)   AHC Utilities    Threatened with loss of utilities: No  Health Literacy: Adequate Health Literacy (08/08/2023)   B1300 Health Literacy    Frequency of need for help with medical instructions: Rarely    Allergies:  Allergies  Allergen Reactions   Iodinated Contrast Media Other (See Comments)    Seizure   Omnipaque [Iohexol] Other (See Comments)    Seizure     Current Medications: Current Outpatient Medications  Medication Sig Dispense Refill   Acetaminophen  Extra Strength 500 MG CAPS Take 1 capsule by mouth 3 (three) times daily.     albuterol  (VENTOLIN  HFA) 108 (90 Base) MCG/ACT inhaler INHALE 2 PUFFS  INTO LUNGS EVERY 4 HOURS AS NEEDED FOR WHEEZING OR SHORTNESS OF BREATH 18 g 12   aspirin  EC 81 MG tablet Take 1 tablet (81 mg total) by mouth daily. Swallow whole. 90 tablet 3   atorvastatin  (LIPITOR) 80 MG tablet Take 1 tablet (80 mg total) by mouth daily. 90 tablet 3   budesonide -formoterol  (SYMBICORT ) 160-4.5 MCG/ACT inhaler Inhale 2 puffs into the lungs daily. 10.2 g 2   carvedilol  (COREG ) 3.125 MG tablet Take 1 tablet (3.125 mg total) by mouth 2 (two) times daily. 180 tablet 3   Cholecalciferol  (VITAMIN D ) 1.25 MG (50000 UT) CAPS Take 1 capsule by mouth once a week. 8 capsule 0   furosemide  (LASIX ) 20 MG tablet TAKE 1 TABLET(20 MG) BY MOUTH DAILY AS NEEDED 90 tablet 1   gabapentin  (NEURONTIN ) 300 MG capsule Take 1 capsule (300 mg total) by mouth 3 (three) times daily. 270 capsule 3   naltrexone  (DEPADE) 50 MG tablet Take 0.5 tablets (25 mg total) by mouth daily. 30 tablet 1   nicotine  (NICODERM CQ  - DOSED IN MG/24 HR) 7 mg/24hr patch Place 1 patch (7 mg total) onto the skin daily. 30 patch 3   nitroGLYCERIN  (NITROSTAT ) 0.4 MG SL tablet Place 1 tablet (0.4 mg total) under the tongue every 5 (five) minutes as needed for chest pain. 25 tablet 1   omeprazole  (PRILOSEC ) 40 MG capsule Take 1 capsule (40 mg total) by mouth daily. 90 capsule 3   sertraline  (ZOLOFT ) 50 MG tablet Take 1 tablet (  50 mg total) by mouth daily. 30 tablet 1   traZODone  (DESYREL ) 100 MG tablet Take 1 tablet (100 mg total) by mouth at bedtime. 30 tablet 1   umeclidinium bromide  (INCRUSE ELLIPTA ) 62.5 MCG/ACT AEPB Inhale 1 puff into the lungs daily. 30 each 11   No current facility-administered medications for this visit.     Musculoskeletal: Strength & Muscle Tone: within normal limits Gait & Station: normal Patient leans: N/A  Psychiatric Specialty Exam: Last menstrual period 11/02/2010.There is no height or weight on file to calculate BMI. Review of Systems  General Appearance: Casual  Eye Contact:  Good  Speech:   Clear and Coherent  Volume:  Normal  Mood:  Euthymic  Affect:  Congruent  Thought Content: Logical   Suicidal Thoughts:  No  Homicidal Thoughts:  No  Thought Process:  Linear  Orientation:  Full (Time, Place, and Person)    Memory: Immediate;   Fair Recent;   Fair Remote;   Fair  Judgment:  Intact  Insight:  Fair  Concentration:  Concentration: Good and Attention Span: Good  Recall:  not formally assessed   Fund of Knowledge: Good  Language: Good  Psychomotor Activity:  Normal  Akathisia:  No  AIMS (if indicated): not done  Assets:  Communication Skills Desire for Improvement Financial Resources/Insurance Intimacy Social Support Transportation  ADL's:  Intact  Cognition: WNL  Sleep:  Fair   Metabolic Disorder Labs: Lab Results  Component Value Date   HGBA1C 6.0 (H) 06/05/2023   MPG 102.54 03/29/2022   MPG 103 05/17/2009   Lab Results  Component Value Date   PROLACTIN 15.5 03/29/2022   Lab Results  Component Value Date   CHOL 152 05/26/2024   TRIG 90 05/26/2024   HDL 58 05/26/2024   CHOLHDL 2.6 05/26/2024   VLDL 34 03/29/2022   LDLCALC 77 05/26/2024   LDLCALC 88 06/05/2023   Lab Results  Component Value Date   TSH 0.988 03/29/2022   TSH 1.100 06/21/2015    Therapeutic Level Labs: No results found for: LITHIUM No results found for: VALPROATE No results found for: CBMZ   Screenings: GAD-7    Flowsheet Row Office Visit from 06/20/2024 in [facility] Counselor from 10/10/2023 in [facility] Office Visit from 02/13/2023 in [facility] for [facility]  Total GAD-7 Score 20 20 18    PHQ2-9    Flowsheet Row [facility] Visit from [facility]20/[facility] in [facility] Visit from 05/26/2024 in [facility] - A Dept Of [place] Office Visit from 02/08/2024 in [facility] - A Dept Of [place] Office Visit from 01/02/2024 in [facility] - A Dept Of [place]. [facility] Counselor from 10/10/2023 in [facility]  PHQ-2 Total Score 4 4 4  0 6  PHQ-9 Total Score 21 17 24  0 24   Flowsheet Row Office Visit from 06/20/2024 in [facility] Counselor from 10/10/2023 in [facility] ED from 10/04/2023 in [facility]  C-SSRS RISK CATEGORY Moderate Risk Error: Q3, 4, or 5 should not be populated when Q2 is No No Risk    Collaboration of Care: Collaboration of Care: Medication Management AEB Dr. Susen  Patient/Guardian was advised Release of Information must be obtained prior to any record release in order to collaborate  their care with an outside provider. Patient/Guardian was advised if they have not already done so to contact the registration department to sign all necessary forms in order for us  to release information regarding their care.   Consent: Patient/Guardian gives verbal consent for treatment and assignment of benefits for services provided during this visit. Patient/Guardian expressed understanding and agreed to proceed.    Gracelee Stemmler, MD 10/09/2024, 8:46 AM

## 2024-10-21 ENCOUNTER — Ambulatory Visit: Payer: MEDICAID | Admitting: Podiatry

## 2024-11-04 ENCOUNTER — Ambulatory Visit: Payer: MEDICAID | Admitting: Podiatry

## 2024-11-05 ENCOUNTER — Encounter: Payer: Self-pay | Admitting: Student

## 2024-11-05 ENCOUNTER — Ambulatory Visit: Payer: Self-pay

## 2024-11-05 ENCOUNTER — Ambulatory Visit: Payer: MEDICAID | Admitting: Student

## 2024-11-05 ENCOUNTER — Other Ambulatory Visit: Payer: Self-pay

## 2024-11-05 VITALS — BP 124/63 | HR 97 | Temp 98.1°F | Ht 63.0 in | Wt 128.6 lb

## 2024-11-05 DIAGNOSIS — R051 Acute cough: Secondary | ICD-10-CM

## 2024-11-05 DIAGNOSIS — J984 Other disorders of lung: Secondary | ICD-10-CM

## 2024-11-05 LAB — POC SOFIA 2 FLU + SARS ANTIGEN FIA
Influenza A, POC: NEGATIVE
Influenza B, POC: NEGATIVE
SARS Coronavirus 2 Ag: NEGATIVE

## 2024-11-05 NOTE — Telephone Encounter (Signed)
 FYI Only or Action Required?: FYI only for provider: appointment scheduled on 11/05/24.  Patient was last seen in primary care on 09/23/2024 by Marylu Gee, DO.  Called Nurse Triage reporting Shortness of Breath.  Symptoms began a week ago.  Interventions attempted: Nothing.  Symptoms are: unchanged.  Triage Disposition: See HCP Within 4 Hours (Or PCP Triage)  Patient/caregiver understands and will follow disposition?: Yes   Copied from CRM [id number]. Topic: Clinical - Red Word Triage >> Nov 05, 2024  8:14 AM Alfonso ORN wrote: Red Word that prompted transfer to Nurse Triage: bad cough, congestion in chest , shortness of breath Reason for Disposition  [1] MILD difficulty breathing (e.g., minimal/no SOB at rest, SOB with walking, pulse < 100) AND [2] NEW-onset or WORSE than normal    Hx copd  Answer Assessment - Initial Assessment Questions 1. RESPIRATORY STATUS: Describe your breathing? (e.g., wheezing, shortness of breath, unable to speak, severe coughing)      Cough, congestion, sob with exertion 2. ONSET: When did this breathing problem begin?      Week ago 3. PATTERN Does the difficult breathing come and go, or has it been constant since it started?      Comes and goes 4. SEVERITY: How bad is your breathing? (e.g., mild, moderate, severe)      moderate 6. CARDIAC HISTORY: Do you have any history of heart disease? (e.g., heart attack, angina, bypass surgery, angioplasty)      chf 7. LUNG HISTORY: Do you have any history of lung disease?  (e.g., pulmonary embolus, asthma, emphysema)     copd 8. CAUSE: What do you think is causing the breathing problem?      congestion 9. OTHER SYMPTOMS: Do you have any other symptoms? (e.g., chest pain, cough, dizziness, fever, runny nose)     denies 10. O2 SATURATION MONITOR:  Do you use an oxygen saturation monitor (pulse oximeter) at home? If Yes, ask: What is your reading (oxygen level) today? What is your usual oxygen  saturation reading? (e.g., 95%) Does not check No oxygen Albuterol  inhaler, 2 puffs every 6 hours; helpfule  Protocols used: Breathing Difficulty-A-AH

## 2024-11-05 NOTE — Patient Instructions (Signed)
 Thank you, [name], for allowing us  to provide your care today. Today we discussed . . .  > Cough and shortness of breath       - I think you most likely have a viral upper respiratory infection however due to your lung disease and your shortness of breath would like to get a chest x-ray to check for any signs of a pneumonia.  If there are signs of pneumonia on this chest x-ray we will start an antibiotic.  In the meantime I would like you to take some over-the-counter medicines for cough and cold indicated for patient with high blood pressure such as Coricidin or ones that say HBP on them.  It is important that if your symptoms do not improve or anything is getting worse that you return to our clinic or go to the emergency department.  I have ordered the following labs for you:  Lab Orders         POC SOFIA 2 FLU + SARS ANTIGEN FIA       Follow up: 3 months   Remember:  Should you have any questions or concerns please call the internal medicine clinic at [phone].     Fairy Pool, DO [facility]

## 2024-11-05 NOTE — Assessment & Plan Note (Signed)
 Presents with 1 week of cough productive of yellow/green sputum, subjective fevers, dyspnea on exertion especially when going up steps, decreased appetite, and generalized bodyaches.  She has ILD and is followed by pulmonology for this.  She is on triple therapy inhalers and albuterol  as needed.  She has been using albuterol  twice daily.  She denies any specific sick contacts but does take the bus frequently.  She is up-to-date on her flu vaccine and pneumonia vaccine.  On exam there are no focal lung sounds, there is good air movement throughout all lung fields, but there is significant coarse breath sounds throughout lung fields.  She is saturating well on room air here at rest and walking.  Overall consistent with a viral respiratory illness but due to her significant lung disease we will get a chest x-ray to check for any signs of pneumonia and treat accordingly.  In the meantime we will treat supportively.  Flu and COVID test today are negative. - Supportive cough and cold medicine - Chest x-ray - Strict return precautions

## 2024-11-05 NOTE — Progress Notes (Signed)
 "  CC: Acute Concern of cough and shortness of breath  HPI:  [name] is a 64 y.o. 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 Please see assessment and plan below for further details.  Medications: Current Outpatient Medications  Medication Instructions   Acetaminophen  Extra Strength 500 MG CAPS 1 capsule, 3 times daily   albuterol  (VENTOLIN  HFA) 108 (90 Base) MCG/ACT inhaler INHALE 2 PUFFS INTO LUNGS EVERY 4 HOURS AS NEEDED FOR WHEEZING OR SHORTNESS OF BREATH   aspirin  EC 81 mg, Oral, Daily, Swallow whole.   atorvastatin  (LIPITOR) 80 mg, Oral, Daily   budesonide -formoterol  (SYMBICORT ) 160-4.5 MCG/ACT inhaler 2 puffs, Inhalation, Daily   carvedilol  (COREG ) 3.125 mg, Oral, 2 times daily   Cholecalciferol  (VITAMIN D ) 1.25 MG (50000 UT) CAPS 1 capsule, Oral, Weekly   furosemide  (LASIX ) 20 MG tablet TAKE 1 TABLET(20 MG) BY MOUTH DAILY AS NEEDED   gabapentin  (NEURONTIN ) 300 mg, Oral, 3 times daily   naltrexone  (DEPADE) 25 mg, Oral, Daily   nicotine  (NICODERM CQ  - DOSED IN MG/24 HR) 7 mg, Transdermal, Every 24 hours   nitroGLYCERIN  (NITROSTAT ) 0.4 mg, Sublingual, Every 5 min PRN   omeprazole  (PRILOSEC ) 40 mg, Oral, Daily   sertraline  (ZOLOFT ) 50 mg, Oral, Daily   traZODone  (DESYREL ) 100 mg, Oral, Daily at bedtime   umeclidinium bromide  (INCRUSE ELLIPTA ) 62.5 MCG/ACT AEPB 1 puff, Inhalation, Daily     Review of Systems:   Pertinent items noted in HPI and/or A&P.  Physical Exam:  Vitals:   11/05/24 1309  BP: 124/63  Pulse: 97  Temp: 98.1 F (36.7 C)  TempSrc: Oral  SpO2: 100%  Weight: 128 lb 9.6 oz (58.3 kg)  Height: 5' 3 (1.6 m)    Constitutional: Chronically ill-appearing elderly female. In no acute distress. HEENT: Normocephalic, atraumatic, Sclera non-icteric, PERRL, EOM intact Cardio:Regular rate and rhythm. 2+ bilateral  radial pulses. Pulm:Clear to auscultation bilaterally. Normal work of breathing on room air. FDX:Wzhjupcz for extremity edema. Skin:Warm and dry. Neuro:Alert and oriented x3. No focal deficit noted. Psych:Pleasant mood and affect.   Assessment & Plan:   Assessment & Plan Acute cough Restrictive lung disease Presents with 1 week of cough productive of yellow/green sputum, subjective fevers, dyspnea on exertion especially when going up steps, decreased appetite, and generalized bodyaches.  She has ILD and is followed by pulmonology for this.  She is on triple therapy inhalers and albuterol  as needed.  She has been using albuterol  twice daily.  She denies any specific sick contacts but does take the bus frequently.  She is up-to-date on her flu vaccine and pneumonia vaccine.  On exam there are no focal lung sounds, there is good air movement throughout all lung fields, but there is significant coarse breath sounds throughout lung fields.  She is saturating well on room air here at rest and walking.  Overall consistent with a viral respiratory illness but due to her significant lung disease we will get a chest x-ray to check for any signs of pneumonia and treat accordingly.  In the meantime we will treat supportively.  Flu and COVID test today are negative. - Supportive cough and cold medicine - Chest x-ray - Strict return precautions  Orders Placed This Encounter  Procedures   DG Chest 2 View    Standing Status:   Future    Expiration Date:   11/05/2025  Reason for Exam (SYMPTOM  OR DIAGNOSIS REQUIRED):   cough and dyspnea    Preferred imaging location?:   [facility]   POC SOFIA 2 FLU + SARS ANTIGEN FIA     Return in about 3 months (around 02/03/2025) for Routine Follow Up.   Patient discussed with Dr. MICAEL Riis Winfrey  Fairy Pool, DO [facility] Internal Medicine Resident PGY-3 Clinic Phone: [phone] Please contact the on call pager at [phone] for  any urgent or emergent needs. "

## 2024-11-07 NOTE — Progress Notes (Signed)
 Internal Medicine Clinic Attending  Case discussed with the resident at the time of the visit.  We reviewed the resident's history and exam and pertinent patient test results.  I agree with the assessment, diagnosis, and plan of care documented in the resident's note.

## 2024-11-12 ENCOUNTER — Telehealth: Payer: Self-pay | Admitting: *Deleted

## 2024-11-12 NOTE — Telephone Encounter (Signed)
 RTC to patient stated does not fell well.  No fever.  Coughing up a little yellow green mucous.  Not able to get up a lot.  Has taken the medication suggested.  Stuffy still.  Lot in chest.                    Copied from CRM #[id number]. Topic: Clinical - Medical Advice >> Nov 12, 2024 10:50 AM Chiquita SQUIBB wrote: Reason for CRM: Patient is calling in stating that she still has the cold and was told to call back today if it had not gotten better. Please advise to the patient.

## 2024-11-14 ENCOUNTER — Ambulatory Visit: Payer: MEDICAID | Admitting: Internal Medicine

## 2024-11-14 ENCOUNTER — Ambulatory Visit
Admission: RE | Admit: 2024-11-14 | Discharge: 2024-11-14 | Disposition: A | Discharge status: Home / Self Care | Payer: MEDICAID | Source: Ambulatory Visit | Attending: Internal Medicine | Admitting: Internal Medicine

## 2024-11-14 VITALS — BP 122/58 | HR 74 | Ht 63.0 in | Wt 129.4 lb

## 2024-11-14 DIAGNOSIS — I340 Nonrheumatic mitral (valve) insufficiency: Secondary | ICD-10-CM | POA: Insufficient documentation

## 2024-11-14 DIAGNOSIS — I428 Other cardiomyopathies: Secondary | ICD-10-CM | POA: Diagnosis present

## 2024-11-14 DIAGNOSIS — I5020 Unspecified systolic (congestive) heart failure: Secondary | ICD-10-CM | POA: Diagnosis present

## 2024-11-14 DIAGNOSIS — E785 Hyperlipidemia, unspecified: Secondary | ICD-10-CM

## 2024-11-14 DIAGNOSIS — R051 Acute cough: Secondary | ICD-10-CM | POA: Insufficient documentation

## 2024-11-14 NOTE — Progress Notes (Signed)
 " Cardiology Office Note:  .    Date:  11/14/2024  ID:  [name], DOB Jan 05, 1961, MRN [id number] PCP: Marylu Gee, DO  [facility] Providers Cardiologist:  Stanly DELENA Leavens, MD     CC: HF f/u   History of Present Illness: .    [name] is a 64 y.o. female with a hx of alcohol  associated cardiomyopathy and prior VF arrest (AHF 2017) with recovery presents with new chest pain. 2023: Re-established with me 2024: Velton Manus, improved ETOH; smoking cessation  [name] is a 64 yo F with alcohol -associated heart failure (history 2014-2017) who presents for follow-up of her cardiac conditions.  She has a history of alcohol -associated heart failure with a prior ventricular fibrillation arrest, initially managed by the [facility] in 2017. She reestablished care in 2023. In 2024, she experienced chest discomfort, shortness of breath, and heart fluttering. At that time, she was still smoking and had COPD. Since then, she has quit smoking and is currently smoke-free, which she describes as a significant factor in her health improvement.  A PET study was performed, showing a global myocardial blood flow reserve of 2.69, which is normal. Her resting ejection fraction was slightly low, but her stress ejection fraction was 63%, with no perfusion defects. An echocardiogram in 2024 showed recovery of her heart function with an ejection fraction of 55-60% and a global longitudinal strain of -22.2%. A repeat heart monitor showed no atrial fibrillation or ventricular tachycardia, though there were questions about small delayed enhancement in the basal inferior and inferolateral walls, raising concerns about prior myocarditis versus infiltrative disease such as sarcoidosis. However, no repeat inflammatory testing has been done.  She underwent a high-resolution CT on October 08, 2023, which showed no hilar or mediastinal lymphadenopathy, making pulmonary sarcoidosis  less likely. Her current medications include aspirin  and atorvastatin  for mild non-obstructive coronary artery disease with circumflex calcifications, Coreg  3.125 mg PO BID for heart failure with reduced ejection fraction, and Lasix  20 mg PO daily as needed. She is also on a low-dose nicotine  patch.  She reports a recent respiratory virus, for which she had chest x-rays done. The x-rays did not show influenza A, COVID, or influenza B. She mentions having a 'spot' on the x-ray. She has not needed nitroglycerin  and her cholesterol, while slightly elevated, is being managed with atorvastatin . She reports eating too many fried foods recently due to increased appetite from the infection.  She has moderate mitral regurgitation noted in 2024, and her LDL was slightly above goal at 77.   Relevant histories: .  Social: from [place] ROS: As per HPI.   Studies Reviewed: .   Cardiac Studies & Procedures   ______________________________________________________________________________________________   STRESS TESTS  NM PET CT CARDIAC PERFUSION MULTI W/ABSOLUTE BLOODFLOW 01/08/2024  Narrative   LV perfusion is normal. There is no evidence of ischemia. There is no evidence of infarction.   Rest left ventricular function is abnormal. Rest EF: 46%. Stress EF: 63%. End diastolic cavity size is normal. End systolic cavity size is mildly enlarged.   Myocardial blood flow was computed to be 0.46ml/g/min at rest and 2.24ml/g/min at stress. Global myocardial blood flow reserve was 2.69 and was normal.   Coronary calcium  was present on the attenuation correction CT images. Mild coronary calcifications were present. Coronary calcifications were present in the left circumflex artery distribution(s).   Findings are consistent with no ischemia and no infarction. The study is low risk.  Normal perfusion, MBRF, stress EF and minimal calcium  noted in LCX.  CLINICAL DATA:  This over-read does not include interpretation  of cardiac or coronary anatomy or pathology. The Cardiac PET CT interpretation by the cardiologist is attached.  COMPARISON:  CT chest, 10/08/2023  FINDINGS: Cardiovascular: Aortic atherosclerosis. Cardiomegaly. No pericardial effusion.  Limited Mediastinum/Nodes: No enlarged mediastinal, hilar, or axillary lymph nodes. Trachea and esophagus demonstrate no significant findings.  Limited Lungs/Pleura: Lungs are clear. No pleural effusion or pneumothorax.  Upper Abdomen: No acute abnormality.  Musculoskeletal: No chest wall abnormality. No acute osseous findings.  IMPRESSION: 1. No acute CT abnormality of the included chest. 2. Cardiomegaly. 3. Pulmonary parenchymal findings of recent prior high-resolution CT are not well appreciated on this free breathing PET-CT.  Aortic Atherosclerosis (ICD10-I70.0).   Electronically Signed By: Marolyn JONETTA Jaksch M.D. On: 01/08/2024 10:56   ECHOCARDIOGRAM  ECHOCARDIOGRAM COMPLETE 10/18/2023  Narrative ECHOCARDIOGRAM REPORT    Patient Name:   [name] Date of Exam: 10/18/2023 Medical Rec #:  [id number]         Height:       64.0 in Accession #:    [id number]        Weight:       143.6 lb Date of Birth:  [birth year]          BSA:          1.699 m Patient Age:    62 years          BP:           120/70 mmHg Patient Gender: F                 HR:           61 bpm. Exam Location:  [address]  Procedure: 2D Echo, 3D Echo, Cardiac Doppler, Color Doppler and Strain Analysis  Indications:    R06.02 Shortness of breath  History:        Patient has prior history of Echocardiogram examinations, most recent 07/08/2023. Stroke, Signs/Symptoms:Chest Pain, Murmur and Shortness of Breath; Risk Factors:Current Smoker and Hypertension. Pulmonary embolism. Cardiac arrest.  Sonographer:    NaTashia Rodgers-Jones RDCS Referring Phys: [id number] Tyqwan Pink A Panayiota Larkin  IMPRESSIONS   1. Left ventricular ejection fraction, by estimation, is 55  to 60%. The left ventricle has normal function. The left ventricle has no regional wall motion abnormalities. Left ventricular diastolic parameters are consistent with Grade I diastolic dysfunction (impaired relaxation). The average left ventricular global longitudinal strain is -22.2 %. The global longitudinal strain is normal. 2. Right ventricular systolic function is normal. The right ventricular size is normal. 3. Left atrial size was moderately dilated. 4. Right atrial size was mildly dilated. 5. The mitral valve is abnormal. Moderate mitral valve regurgitation. No evidence of mitral stenosis. 6. The aortic valve is normal in structure. Aortic valve regurgitation is not visualized. No aortic stenosis is present. 7. The inferior vena cava is normal in size with greater than 50% respiratory variability, suggesting right atrial pressure of 3 mmHg.  FINDINGS Left Ventricle: Left ventricular ejection fraction, by estimation, is 55 to 60%. The left ventricle has normal function. The left ventricle has no regional wall motion abnormalities. The average left ventricular global longitudinal strain is -22.2 %. The global longitudinal strain is normal. The left ventricular internal cavity size was normal in size. There is no left ventricular hypertrophy. Left ventricular diastolic parameters are consistent with Grade I diastolic dysfunction (impaired relaxation).  Right Ventricle: The  right ventricular size is normal. No increase in right ventricular wall thickness. Right ventricular systolic function is normal.  Left Atrium: Left atrial size was moderately dilated.  Right Atrium: Right atrial size was mildly dilated.  Pericardium: There is no evidence of pericardial effusion.  Mitral Valve: The mitral valve is abnormal. Cannot exclude partial flail of the medial scallop of the posterior MV leaflet (image 5) with mild to moderate anteriorly directed MR. Consider TEE to further evalaute as clinically  indicated. The mitral valve is abnormal. There is mild calcification of the mitral valve leaflet(s). Moderate mitral valve regurgitation. No evidence of mitral valve stenosis.  Tricuspid Valve: The tricuspid valve is normal in structure. Tricuspid valve regurgitation is trivial. No evidence of tricuspid stenosis.  Aortic Valve: The aortic valve is normal in structure. Aortic valve regurgitation is not visualized. No aortic stenosis is present.  Pulmonic Valve: The pulmonic valve was normal in structure. Pulmonic valve regurgitation is not visualized. No evidence of pulmonic stenosis.  Aorta: The aortic root is normal in size and structure.  Venous: The inferior vena cava is normal in size with greater than 50% respiratory variability, suggesting right atrial pressure of 3 mmHg.  IAS/Shunts: No atrial level shunt detected by color flow Doppler.   LEFT VENTRICLE PLAX 2D LVIDd:         4.40 cm   Diastology LVIDs:         2.90 cm   LV e' medial:    7.13 cm/s LV PW:         0.80 cm   LV E/e' medial:  16.4 LV IVS:        0.80 cm   LV e' lateral:   7.29 cm/s LVOT diam:     1.60 cm   LV E/e' lateral: 16.0 LV SV:         41 LV SV Index:   24        2D Longitudinal Strain LVOT Area:     2.01 cm  2D Strain GLS (A2C):   -22.2 % 2D Strain GLS (A3C):   -23.0 % 2D Strain GLS (A4C):   -21.3 % 2D Strain GLS Avg:     -22.2 %  3D Volume EF: 3D EF:        55 % LV EDV:       128 ml LV ESV:       58 ml LV SV:        70 ml  RIGHT VENTRICLE             IVC RV Basal diam:  3.30 cm     IVC diam: 1.10 cm RV S prime:     10.04 cm/s TAPSE (M-mode): 2.6 cm  LEFT ATRIUM             Index        RIGHT ATRIUM           Index LA diam:        4.20 cm 2.47 cm/m   RA Area:     13.00 cm LA Vol (A2C):   70.1 ml 41.25 ml/m  RA Volume:   34.90 ml  20.54 ml/m LA Vol (A4C):   67.3 ml 39.60 ml/m LA Biplane Vol: 70.7 ml 41.60 ml/m AORTIC VALVE LVOT Vmax:   84.30 cm/s LVOT Vmean:  54.500 cm/s LVOT VTI:     0.202 m  AORTA Ao Root diam: 3.00 cm Ao Asc diam:  3.10 cm  MITRAL VALVE  TRICUSPID VALVE MV Area (PHT): 3.59 cm     TR Peak grad:   22.8 mmHg MV Decel Time: 212 msec     TR Vmax:        239.00 cm/s MV E velocity: 116.50 cm/s MV A velocity: 121.00 cm/s  SHUNTS MV E/A ratio:  0.96         Systemic VTI:  0.20 m Systemic Diam: 1.60 cm  Toribio Fuel MD Electronically signed by Toribio Fuel MD Signature Date/Time: 10/18/2023/10:22:11 AM    Final    MONITORS  LONG TERM MONITOR (3-14 DAYS) 03/27/2024  Narrative HR 53 - 144, average 80 bpm. Rare supraventricular and ventricular ectopy. No sustained arrhythmias. No atrial fibrillation.  Ole T. Cindie, MD, [facility], [facility] Cardiac Electrophysiology     CARDIAC MRI  MR CARDIAC MORPHOLOGY W WO CONTRAST 01/29/2013  Narrative *RADIOLOGY REPORT*  Clinical Data: Cardiac arrest cardiomyopathy  MR CARDIA MORPHOLOGY WITHOUT AND WITH CONTRAST  GE 1.5 T magnet with dedicated cardiac coil.  FIESTA sequences for function and morphology.  10 minutes after 15 mL Multihance  contrast was injected, inversion recovery sequences were done to assess for myocardial delayed enhancement.  EF was calculated at a dedicated workstation.  Contrast: 15mL MULTIHANCE  GADOBENATE DIMEGLUMINE  529 MG/ML IV SOLN  Comparison: None.  Findings: Normal left ventricular size and wall thickness.  EF 51% with mild mid to apical septal hypokinesis.  Normal right ventricular size and systolic function.  Mild left atrial enlargement.  Normal right atrial size.  Mitral regurgitation appears mild but flow sequences to quantify were not done.  No significant aortic regurgitation or stenosis.  On delayed enhancement imaging, there was a very small area of basal inferior subendocardial enhancement.  There was also a very small area of mid inferolateral mid-wall enhancement.  Measurements:  LV EDV 111 mL  LV SV 57 mL  LV EF  51%  IMPRESSION: 1. Normal LV size with mildly decreased systolic function, EF 51%. There was mid to apical septal hypokinesis.  2. Normal RV size and systolic function.  3. Very small areas of delayed enhancement in the basal inferior and mid inferolateral walls.  This is not a coronary disease pattern.  Cannot rule out prior myocarditis or infiltrative disease such as sarcoidosis.   Original Report Authenticated By: Ezra Shuck   ______________________________________________________________________________________________       Physical Exam:    VS:  BP (!) 122/58 (BP Location: Right Arm)   Pulse 74   Ht 5' 3 (1.6 m)   Wt 129 lb 6.4 oz (58.7 kg)   LMP 11/02/2010   SpO2 93%   BMI 22.92 kg/m    Wt Readings from Last 3 Encounters:  11/14/24 129 lb 6.4 oz (58.7 kg)  11/05/24 128 lb 9.6 oz (58.3 kg)  09/23/24 129 lb 6.4 oz (58.7 kg)    Gen: no distress  Ears: no Dempsey Sign Cardiac: No Rubs or Gallops, systolic murmur Murmur, RRR +2 radial pulses Respiratory: Clear to auscultation bilaterally, normal effort, normal  respiratory rate GI: Soft, nontender, non-distended  MS: No  edema;  moves all extremities Integument: Skin feels warm Neuro:  At time of evaluation, alert and oriented to person/place/time/situation  Psych: Normal affect, patient feels well   ASSESSMENT AND PLAN: .    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 No evidence of repeat ventricular fibrillation on heart  monitor. Blood pressure and heart rate are well-controlled. Smoking cessation has significantly improved her condition. - Continue carvedilol  3.125 mg PO BID - Continue aspirin  and atorvastatin  - query of healed myocarditis vs Sarcoidosis on CMR 2024; no ectopy with CL in 2025; no PS sequelae in CT imaging; deferring  PET inflammatory imaging at this time - repeat echo  Coronary artery disease Mild non-obstructive coronary artery disease with circumflex calcifications. PET study showed no perfusion defects and normal myocardial blood flow reserve. Smoking cessation has likely improved coronary artery health. - Continue aspirin  and atorvastatin  - LDL direct during echo  Mitral regurgitation Moderate mitral regurgitation noted in 2024. No severe mitral regurgitation on current examination. - Ordered repeat echocardiogram to assess mitral regurgitation  Hypercholesterolemia LDL slightly above goal at 77 mg/dL. Dietary habits include excessive fried foods, contributing to elevated cholesterol levels. Potential for dietary modifications to reduce LDL levels. - Ordered LDL direct test - Advised dietary modifications to reduce fried foods - Will consider starting ezetimibe if LDL remains elevated  Longitudinal care: The evaluation and management services provided today reflect the complexity inherent in caring for this patient, including the ongoing longitudinal relationship and management of multiple chronic conditions and/or the need for care coordination. The visit required a comprehensive assessment and management plan tailored to the patient's unique needs Time was spent addressing not only the acute concerns but also the broader context of the patient's health, including preventive care, chronic disease management, and care coordination as appropriate.  Complex longitudinal is necessary for conditions including: secondary prevention; hx of VT arrest but with LV recovery and no further ectopy with poly substance cessation  Stanly Leavens, MD FASE [facility] Cardiologist [facility]  [address] [place], [address] [place], [place] [address] [phone]  2:19 PM  "

## 2024-11-14 NOTE — Patient Instructions (Signed)
 Medication Instructions:  Your physician recommends that you continue on your current medications as directed. Please refer to the Current Medication list given to you today.   *If you need a refill on your cardiac medications before your next appointment, please call your pharmacy*  Lab Work: [facility] Direct at [employer]  If you have labs (blood work) drawn today and your tests are completely normal, you will receive your results only by: MyChart Message (if you have MyChart) OR A paper copy in the mail If you have any lab test that is abnormal or we need to change your treatment, we will call you to review the results.  Testing/Procedures: Your physician has requested that you have an echocardiogram. Echocardiography is a painless test that uses sound waves to create images of your heart. It provides your doctor with information about the size and shape of your heart and how well your hearts chambers and valves are working. This procedure takes approximately one hour. There are no restrictions for this procedure. Please do NOT wear cologne, perfume, aftershave, or lotions (deodorant is allowed). Please arrive 15 minutes prior to your appointment time.  Please note: We ask at that you not bring children with you during ultrasound (echo/ vascular) testing. Due to room size and safety concerns, children are not allowed in the ultrasound rooms during exams. Our front office staff cannot provide observation of children in our lobby area while testing is being conducted. An adult accompanying a patient to their appointment will only be allowed in the ultrasound room at the discretion of the ultrasound technician under special circumstances. We apologize for any inconvenience.   Follow-Up: At [facility], you and your health needs are our priority.  As part of our continuing mission to provide you with exceptional heart care, our providers are all part of one team.  This team includes your  primary Cardiologist (physician) and Advanced Practice Providers or APPs (Physician Assistants and Nurse Practitioners) who all work together to provide you with the care you need, when you need it.  Your next appointment:   1 year(s)  Provider:   One of our Advanced Practice Providers (APPs): Morse Clause, PA-C  Lamarr Satterfield, NP Miriam Shams, NP  Olivia Pavy, PA-C Josefa Beauvais, NP  Leontine Salen, PA-C Orren Fabry, PA-C  [place], PA-C Ernest Tacker, NP  Damien Braver, NP Jon Hails, PA-C  Waddell Donath, PA-C    Dayna Dunn, PA-C  Scott Weaver, PA-C Lum Louis, NP Katlyn West, NP Callie Goodrich, PA-C  Xika Zhao, NP Sheng Haley, PA-C    Kathleen Johnson, PA-C    We recommend signing up for the patient portal called MyChart.  Sign up information is provided on this After Visit Summary.  MyChart is used to connect with patients for Virtual Visits (Telemedicine).  Patients are able to view lab/test results, encounter notes, upcoming appointments, etc.  Non-urgent messages can be sent to your provider as well.   To learn more about what you can do with MyChart, go to [email or web address].   Other Instructions

## 2024-11-19 ENCOUNTER — Telehealth: Payer: Self-pay

## 2024-11-19 ENCOUNTER — Other Ambulatory Visit: Payer: Self-pay

## 2024-11-19 ENCOUNTER — Encounter: Payer: Self-pay | Admitting: Pulmonary Disease

## 2024-11-19 ENCOUNTER — Ambulatory Visit: Payer: MEDICAID | Admitting: Pulmonary Disease

## 2024-11-19 VITALS — BP 124/72 | HR 71 | Ht 64.0 in | Wt 128.4 lb

## 2024-11-19 DIAGNOSIS — J984 Other disorders of lung: Secondary | ICD-10-CM

## 2024-11-19 DIAGNOSIS — J069 Acute upper respiratory infection, unspecified: Secondary | ICD-10-CM

## 2024-11-19 DIAGNOSIS — R942 Abnormal results of pulmonary function studies: Secondary | ICD-10-CM

## 2024-11-19 DIAGNOSIS — Z87891 Personal history of nicotine dependence: Secondary | ICD-10-CM

## 2024-11-19 MED ORDER — ALBUTEROL SULFATE (2.5 MG/3ML) 0.083% IN NEBU: 2.5000 mg | INHALATION_SOLUTION | Freq: Four times a day (QID) | RESPIRATORY_TRACT | 11 refills | Status: AC | PRN

## 2024-11-19 MED ORDER — BUDESONIDE-FORMOTEROL FUMARATE 160-4.5 MCG/ACT IN AERO: 2.0000 | INHALATION_SPRAY | Freq: Every day | RESPIRATORY_TRACT | 2 refills | Status: DC

## 2024-11-19 NOTE — Patient Instructions (Signed)
 Continue symbicort  2 puffs twice daily - rinse mouth out after each use  Continue incruse inhaler 1 puff daily  Use albuterol  inhaler or nebulizer treatment every 4-6 hours as needed.  I am proud of you for quitting smoking. Continue nicotine  patches 14-21mg  daily and consider getting mini nicotine  lozenges 2mg  to take as needed.   Schedule pulmonary function tests at the front desk, can schedule in the next month or two  Follow up in 6 months

## 2024-11-19 NOTE — Telephone Encounter (Signed)
 Requested images through PS from [facility] - 10/02/2024 and 07/03/2020

## 2024-11-19 NOTE — Progress Notes (Unsigned)
 "  Established Patient Pulmonology Office Visit   Subjective:  [name], female    DOB: 09/12/1961  MRN: [id number]  CC: No chief complaint on file.   Discussed the use of AI scribe software for clinical note transcription with the patient, who gave verbal consent to proceed.  History of Present Illness  [name] is a 64 year old woman, daily smoker with history of hypertension, DVT/PE, alchohols abuse, cardiomyopathy and V. Fib arrest in 2017 who returns to pulmonary clinic for restrictive lung disease.      {PULM QUESTIONNAIRES (Optional):33196}  ROS  {History (Optional):23778} Current Medications[1]      Objective:  LMP 11/02/2010   {Pulm Vitals (Optional):32837}  Physical Exam   Diagnostic Review:  {Labs (Optional):32838}  HRCT Chest 10/08/23 1. Very subtle scattered areas of mild ground-glass attenuation in the lungs bilaterally, nonspecific. This could be indicative of early or mild interstitial lung disease, at this time, categorized as indeterminate for usual interstitial pneumonia (UIP) per current ATS guidelines. If there is persistent clinical concern for interstitial lung disease, repeat high-resolution chest CT should be considered in 12 months to assess for temporal changes in the appearance of the lung parenchyma. 2. Aortic atherosclerosis.    Assessment & Plan:   Assessment & Plan Restrictive lung disease  Orders:   Pulmonary Function Test; Future   budesonide -formoterol  (SYMBICORT ) 160-4.5 MCG/ACT inhaler; Inhale 2 puffs into the lungs daily.   albuterol  (PROVENTIL ) (2.5 MG/3ML) 0.083% nebulizer solution; Take 3 mLs (2.5 mg total) by nebulization every 6 (six) hours as needed for wheezing or shortness of breath.  Decreased diffusion capacity of lung  Orders:   Pulmonary Function Test; Future  Former smoker  Orders:   Ambulatory Referral for Lung Cancer Scre   Assessment and Plan Assessment & Plan        No follow-ups on file.   Dorn KATHEE Chill, MD     [1]  Current Outpatient Medications:    Acetaminophen  Extra Strength 500 MG CAPS, Take 1 capsule by mouth 3 (three) times daily., Disp: , Rfl:    albuterol  (VENTOLIN  HFA) 108 (90 Base) MCG/ACT inhaler, INHALE 2 PUFFS INTO LUNGS EVERY 4 HOURS AS NEEDED FOR WHEEZING OR SHORTNESS OF BREATH, Disp: 18 g, Rfl: 12   aspirin  EC 81 MG tablet, Take 1 tablet (81 mg total) by mouth daily. Swallow whole., Disp: 90 tablet, Rfl: 3   atorvastatin  (LIPITOR) 80 MG tablet, Take 1 tablet (80 mg total) by mouth daily., Disp: 90 tablet, Rfl: 3   budesonide -formoterol  (SYMBICORT ) 160-4.5 MCG/ACT inhaler, Inhale 2 puffs into the lungs daily., Disp: 10.2 g, Rfl: 2   carvedilol  (COREG ) 3.125 MG tablet, Take 1 tablet (3.125 mg total) by mouth 2 (two) times daily., Disp: 180 tablet, Rfl: 3   Cholecalciferol  (VITAMIN D ) 1.25 MG (50000 UT) CAPS, Take 1 capsule by mouth once a week., Disp: 8 capsule, Rfl: 0   furosemide  (LASIX ) 20 MG tablet, TAKE 1 TABLET(20 MG) BY MOUTH DAILY AS NEEDED, Disp: 90 tablet, Rfl: 1   gabapentin  (NEURONTIN ) 300 MG capsule, Take 1 capsule (300 mg total) by mouth 3 (three) times daily., Disp: 270 capsule, Rfl: 3   naltrexone  (DEPADE) 50 MG tablet, Take 0.5 tablets (25 mg total) by mouth daily., Disp: 30 tablet, Rfl: 1   nicotine  (NICODERM CQ  - DOSED IN MG/24 HR) 7 mg/24hr patch, Place 1 patch (7 mg total) onto the skin daily., Disp: 30 patch, Rfl: 3   nitroGLYCERIN  (NITROSTAT ) 0.4 MG  SL tablet, Place 1 tablet (0.4 mg total) under the tongue every 5 (five) minutes as needed for chest pain., Disp: 25 tablet, Rfl: 1   omeprazole  (PRILOSEC ) 40 MG capsule, Take 1 capsule (40 mg total) by mouth daily., Disp: 90 capsule, Rfl: 3   sertraline  (ZOLOFT ) 50 MG tablet, Take 1 tablet (50 mg total) by mouth daily., Disp: 30 tablet, Rfl: 1   traZODone  (DESYREL ) 100 MG tablet, Take 1 tablet (100 mg total) by mouth at bedtime., Disp: 30 tablet, Rfl: 1    umeclidinium bromide  (INCRUSE ELLIPTA ) 62.5 MCG/ACT AEPB, Inhale 1 puff into the lungs daily., Disp: 30 each, Rfl: 11  "

## 2024-11-19 NOTE — Assessment & Plan Note (Addendum)
" °  Orders:   Pulmonary Function Test; Future   budesonide -formoterol  (SYMBICORT ) 160-4.5 MCG/ACT inhaler; Inhale 2 puffs into the lungs daily.   albuterol  (PROVENTIL ) (2.5 MG/3ML) 0.083% nebulizer solution; Take 3 mLs (2.5 mg total) by nebulization every 6 (six) hours as needed for wheezing or shortness of breath.  "

## 2024-11-20 ENCOUNTER — Encounter: Payer: Self-pay | Admitting: Pulmonary Disease

## 2024-11-25 ENCOUNTER — Telehealth: Payer: Self-pay

## 2024-11-25 ENCOUNTER — Other Ambulatory Visit (HOSPITAL_COMMUNITY): Payer: Self-pay

## 2024-11-25 DIAGNOSIS — J984 Other disorders of lung: Secondary | ICD-10-CM

## 2024-11-25 NOTE — Telephone Encounter (Signed)
 Looks like brand symbiort is preferred,want me to send?

## 2024-11-25 NOTE — Telephone Encounter (Signed)
*  Pulm  Pharmacy Patient Advocate Encounter   Received notification from Fax that prior authorization for Breyna  is required/requested.   Insurance verification completed.   The patient is insured through [place] [employer].   Per test claim:  Brand Symbicort  is preferred by the insurance.  If suggested medication is appropriate, Please send in a new RX and discontinue this one. If not, please advise as to why it's not appropriate so that we may request a Prior Authorization. Please note, some preferred medications may still require a PA.  If the suggested medications have not been trialed and there are no contraindications to their use, the PA will not be submitted, as it will not be approved. Archived Key: [id number]

## 2024-11-26 ENCOUNTER — Telehealth: Payer: Self-pay

## 2024-11-26 DIAGNOSIS — Z122 Encounter for screening for malignant neoplasm of respiratory organs: Secondary | ICD-10-CM

## 2024-11-26 DIAGNOSIS — Z87891 Personal history of nicotine dependence: Secondary | ICD-10-CM

## 2024-11-26 NOTE — Telephone Encounter (Signed)
 Lung Cancer Screening Narrative/Criteria Questionnaire (Cigarette Smokers Only- No Cigars/Pipes/vapes)   [name]   SDMV:12/02/2024 at 10:00 am [name]        1961-05-03               LDCT: 12/11/2024 at 1:00 pm  GI   64 y.o.   Phone: [phone]  Lung Screening Narrative (confirm age 90-77 yrs Medicare / 50-80 yrs Private pay insurance)   Insurance information: [employer]   Referring Provider: Kara, MD   This screening involves an initial phone call with a team member from our program. It is called a shared decision making visit. The initial meeting is required by  insurance and Medicare to make sure you understand the program. This appointment takes about 15-20 minutes to complete. You will complete the screening scan at your scheduled date/time.  This scan takes about 5-10 minutes to complete. You can eat and drink normally before and after the scan.  Criteria questions for Lung Cancer Screening:   Are you a current or former smoker? Former Age began smoking: 15   If you are a former smoker, what year did you quit smoking? {Quit 2 years and started back. (within 15 yrs)   To calculate your smoking history, I need an accurate estimate of how many packs of cigarettes you smoked per day and for how many years. (Not just the number of PPD you are now smoking)   Years smoking 46 x Packs per day 1 = Pack years 46   (at least 20 pack yrs)   (Make sure they understand that we need to know how much they have smoked in the past, not just the number of PPD they are smoking now)  Do you have a personal history of cancer?  No    Do you have a family history of cancer? Yes  (cancer type and and relative) Mother and Sisters with breast cancer. Father had colon cancer.   Are you coughing up blood?  No  Have you had unexplained weight loss of 15 lbs or more in the last 6 months? No  It looks like you meet all criteria.  When would be a good time for us  to schedule you for this  screening?   Additional information: N/A

## 2024-11-27 ENCOUNTER — Ambulatory Visit: Payer: MEDICAID | Admitting: Podiatry

## 2024-11-27 MED ORDER — BUDESONIDE-FORMOTEROL FUMARATE 160-4.5 MCG/ACT IN AERO: 2.0000 | INHALATION_SPRAY | Freq: Every day | RESPIRATORY_TRACT | 11 refills | Status: AC

## 2024-11-27 NOTE — Addendum Note (Signed)
 Addended by: MELVENIA WILFORD SAUNDERS on: 11/27/2024 09:02 AM   Modules accepted: Orders

## 2024-12-01 ENCOUNTER — Ambulatory Visit: Payer: Self-pay | Admitting: Student

## 2024-12-01 ENCOUNTER — Encounter: Payer: Self-pay | Admitting: Gastroenterology

## 2024-12-02 ENCOUNTER — Ambulatory Visit: Payer: MEDICAID | Admitting: *Deleted

## 2024-12-02 ENCOUNTER — Encounter: Payer: Self-pay | Admitting: *Deleted

## 2024-12-02 DIAGNOSIS — Z87891 Personal history of nicotine dependence: Secondary | ICD-10-CM

## 2024-12-02 DIAGNOSIS — F17210 Nicotine dependence, cigarettes, uncomplicated: Secondary | ICD-10-CM

## 2024-12-02 NOTE — Patient Instructions (Signed)

## 2024-12-02 NOTE — Progress Notes (Signed)
 Virtual Visit via Telephone Note  I connected with [name] on 12/02/24 at 10:00 AM EST by telephone and verified that I am speaking with the correct person using two identifiers.  Location: Patient: at home Provider: [address], [place], [place], [address]    I discussed the limitations, risks, security and privacy concerns of performing an evaluation and management service by telephone and the availability of in person appointments. I also discussed with the patient that there may be a patient responsible charge related to this service. The patient expressed understanding and agreed to proceed.    Shared Decision Making Visit Lung Cancer Screening Program [phone])   Eligibility: Age 64 y.o. Pack Years Smoking History Calculation 46 (# packs/per year x # years smoked) Recent History of coughing up blood  no Unexplained weight loss? yes ( >Than 15 pounds within the last 6 months ) Prior History Lung / other cancer no (Diagnosis within the last 5 years already requiring surveillance chest CT Scans). Smoking Status Current Smoker Former Smokers: Years since quit: n/a  Quit Date: n/a  Visit Components: Discussion included one or more decision making aids. yes Discussion included risk/benefits of screening. yes Discussion included potential follow up diagnostic testing for abnormal scans. yes Discussion included meaning and risk of over diagnosis. yes Discussion included meaning and risk of False Positives. yes Discussion included meaning of total radiation exposure. yes  Counseling Included: Importance of adherence to annual lung cancer LDCT screening. yes Impact of comorbidities on ability to participate in the program. yes Ability and willingness to under diagnostic treatment. yes  Smoking Cessation Counseling: Current Smokers:  Discussed importance of smoking cessation. yes Information about tobacco cessation classes and interventions provided to  patient. yes Patient provided with ticket for LDCT Scan. no Symptomatic Patient. yes  Counseling(Intermediate counseling: > three minutes) 99406 Diagnosis Code: Tobacco Use Z72.0 Asymptomatic Patient no  Smoking/Tobacco Cessation Counseling [name] is a current user of tobacco or nicotine  products. She is considering quitting at this time. Counseling provided today addressed the risks of continued use and the benefits of cessation. Discussed tobacco/nicotine  use history, readiness to quit, and evidence-based treatment options including behavioral strategies, support resources, and pharmacologic therapies. Provided encouragement and educational materials on steps and resources to quit smoking. Patient questions were addressed, and follow-up recommended for continued support. Total time spent on counseling: 4 minutes.   Former Smokers:  Discussed the importance of maintaining cigarette abstinence. yes Diagnosis Code: Personal History of Nicotine  Dependence. S12.108 Information about tobacco cessation classes and interventions provided to patient. Yes Patient provided with ticket for LDCT Scan. no Written Order for Lung Cancer Screening with LDCT placed in Epic. Yes (CT Chest Lung Cancer Screening Low Dose W/O CM) [id number] Z12.2-Screening of respiratory organs Z87.891-Personal history of nicotine  dependence   Laneta Speaks, RN

## 2024-12-03 ENCOUNTER — Ambulatory Visit: Payer: MEDICAID | Admitting: Internal Medicine

## 2024-12-11 ENCOUNTER — Other Ambulatory Visit: Payer: MEDICAID

## 2024-12-17 ENCOUNTER — Ambulatory Visit (HOSPITAL_COMMUNITY): Payer: MEDICAID

## 2025-01-28 ENCOUNTER — Ambulatory Visit: Payer: MEDICAID | Admitting: Pulmonary Disease
